# Patient Record
Sex: Female | Born: 1935 | Race: Black or African American | Hispanic: No | Smoking: Never smoker
Health system: Southern US, Community
[De-identification: ages and names within clinical notes are randomized; demographics above are authoritative.]

## PROBLEM LIST (undated history)

## (undated) DIAGNOSIS — E119 Type 2 diabetes mellitus without complications: Secondary | ICD-10-CM

## (undated) DIAGNOSIS — F0390 Unspecified dementia without behavioral disturbance: Secondary | ICD-10-CM

## (undated) DIAGNOSIS — M109 Gout, unspecified: Secondary | ICD-10-CM

## (undated) DIAGNOSIS — I10 Essential (primary) hypertension: Secondary | ICD-10-CM

## (undated) DIAGNOSIS — M869 Osteomyelitis, unspecified: Secondary | ICD-10-CM

## (undated) DIAGNOSIS — M1990 Unspecified osteoarthritis, unspecified site: Secondary | ICD-10-CM

## (undated) HISTORY — PX: PACEMAKER INSERTION: SHX728

## (undated) HISTORY — PX: CARDIAC SURGERY: SHX584

## (undated) HISTORY — PX: OTHER SURGICAL HISTORY: SHX169

---

## 2013-10-31 ENCOUNTER — Emergency Department: Payer: Self-pay | Admitting: Internal Medicine

## 2013-10-31 LAB — URINALYSIS, COMPLETE
Bilirubin,UR: NEGATIVE
Blood: NEGATIVE
Glucose,UR: NEGATIVE mg/dL (ref 0–75)
Ketone: NEGATIVE
NITRITE: NEGATIVE
Ph: 8 (ref 4.5–8.0)
RBC, UR: NONE SEEN /HPF (ref 0–5)
SQUAMOUS EPITHELIAL: NONE SEEN
Specific Gravity: 1.012 (ref 1.003–1.030)
WBC UR: 2 /HPF (ref 0–5)

## 2013-10-31 LAB — COMPREHENSIVE METABOLIC PANEL
ALT: 30 U/L
AST: 20 U/L (ref 15–37)
Albumin: 3 g/dL — ABNORMAL LOW (ref 3.4–5.0)
Alkaline Phosphatase: 54 U/L
Anion Gap: 8 (ref 7–16)
BILIRUBIN TOTAL: 0.4 mg/dL (ref 0.2–1.0)
BUN: 24 mg/dL — ABNORMAL HIGH (ref 7–18)
CALCIUM: 9.6 mg/dL (ref 8.5–10.1)
Chloride: 109 mmol/L — ABNORMAL HIGH (ref 98–107)
Co2: 21 mmol/L (ref 21–32)
Creatinine: 1.25 mg/dL (ref 0.60–1.30)
EGFR (Non-African Amer.): 38 — ABNORMAL LOW
GFR CALC AF AMER: 44 — AB
GLUCOSE: 152 mg/dL — AB (ref 65–99)
OSMOLALITY: 283 (ref 275–301)
Potassium: 4 mmol/L (ref 3.5–5.1)
Sodium: 138 mmol/L (ref 136–145)
Total Protein: 6.9 g/dL (ref 6.4–8.2)

## 2013-10-31 LAB — CBC
HCT: 38.9 % (ref 35.0–47.0)
HGB: 12.5 g/dL (ref 12.0–16.0)
MCH: 30 pg (ref 26.0–34.0)
MCHC: 32 g/dL (ref 32.0–36.0)
MCV: 94 fL (ref 80–100)
Platelet: 237 10*3/uL (ref 150–440)
RBC: 4.16 10*6/uL (ref 3.80–5.20)
RDW: 15.3 % — AB (ref 11.5–14.5)
WBC: 6.9 10*3/uL (ref 3.6–11.0)

## 2013-10-31 LAB — TROPONIN I: Troponin-I: <0.02 ng/mL

## 2015-06-09 ENCOUNTER — Other Ambulatory Visit
Admission: RE | Admit: 2015-06-09 | Discharge: 2015-06-09 | Disposition: A | Discharge status: Home / Self Care | Payer: Medicare Other | Source: Ambulatory Visit | Attending: Family Medicine | Admitting: Family Medicine

## 2015-06-09 DIAGNOSIS — M869 Osteomyelitis, unspecified: Secondary | ICD-10-CM | POA: Insufficient documentation

## 2015-06-09 LAB — CBC WITH DIFFERENTIAL/PLATELET
BASOS ABS: 0 10*3/uL (ref 0–0.1)
Basophils Relative: 0 %
EOS ABS: 0.1 10*3/uL (ref 0–0.7)
EOS PCT: 1 %
HCT: 23.7 % — ABNORMAL LOW (ref 35.0–47.0)
Hemoglobin: 7.7 g/dL — ABNORMAL LOW (ref 12.0–16.0)
Lymphocytes Relative: 12 %
Lymphs Abs: 0.6 10*3/uL — ABNORMAL LOW (ref 1.0–3.6)
MCH: 28.5 pg (ref 26.0–34.0)
MCHC: 32.6 g/dL (ref 32.0–36.0)
MCV: 87.4 fL (ref 80.0–100.0)
Monocytes Absolute: 0.9 10*3/uL (ref 0.2–0.9)
Monocytes Relative: 18 %
Neutro Abs: 3.5 10*3/uL (ref 1.4–6.5)
Neutrophils Relative %: 69 %
PLATELETS: 227 10*3/uL (ref 150–440)
RBC: 2.71 MIL/uL — AB (ref 3.80–5.20)
RDW: 17.9 % — ABNORMAL HIGH (ref 11.5–14.5)
WBC: 5.2 10*3/uL (ref 3.6–11.0)

## 2015-06-09 LAB — CREATININE, SERUM
Creatinine, Ser: 1.31 mg/dL — ABNORMAL HIGH (ref 0.44–1.00)
GFR, EST AFRICAN AMERICAN: 40 mL/min — AB (ref >60)
GFR, EST NON AFRICAN AMERICAN: 34 mL/min — AB (ref >60)

## 2015-06-09 LAB — BUN: BUN: 19 mg/dL (ref 6–20)

## 2015-06-09 LAB — VANCOMYCIN, TROUGH: Vancomycin Tr: 28 ug/mL (ref 10–20)

## 2015-06-12 ENCOUNTER — Inpatient Hospital Stay
Admission: EM | Admit: 2015-06-12 | Discharge: 2015-06-15 | DRG: 539 | Disposition: A | Discharge status: Home / Self Care | Payer: Medicare Other | Attending: Internal Medicine | Admitting: Internal Medicine

## 2015-06-12 ENCOUNTER — Emergency Department: Payer: Medicare Other

## 2015-06-12 DIAGNOSIS — N3090 Cystitis, unspecified without hematuria: Secondary | ICD-10-CM

## 2015-06-12 DIAGNOSIS — L89150 Pressure ulcer of sacral region, unstageable: Secondary | ICD-10-CM

## 2015-06-12 DIAGNOSIS — R410 Disorientation, unspecified: Secondary | ICD-10-CM

## 2015-06-12 DIAGNOSIS — E669 Obesity, unspecified: Secondary | ICD-10-CM | POA: Diagnosis present

## 2015-06-12 DIAGNOSIS — G9341 Metabolic encephalopathy: Secondary | ICD-10-CM | POA: Diagnosis present

## 2015-06-12 DIAGNOSIS — R4182 Altered mental status, unspecified: Secondary | ICD-10-CM | POA: Diagnosis present

## 2015-06-12 DIAGNOSIS — Z6826 Body mass index (BMI) 26.0-26.9, adult: Secondary | ICD-10-CM | POA: Diagnosis not present

## 2015-06-12 DIAGNOSIS — Z515 Encounter for palliative care: Secondary | ICD-10-CM | POA: Diagnosis present

## 2015-06-12 DIAGNOSIS — I129 Hypertensive chronic kidney disease with stage 1 through stage 4 chronic kidney disease, or unspecified chronic kidney disease: Secondary | ICD-10-CM | POA: Diagnosis present

## 2015-06-12 DIAGNOSIS — Z7401 Bed confinement status: Secondary | ICD-10-CM | POA: Diagnosis not present

## 2015-06-12 DIAGNOSIS — Z79899 Other long term (current) drug therapy: Secondary | ICD-10-CM

## 2015-06-12 DIAGNOSIS — L8990 Pressure ulcer of unspecified site, unspecified stage: Secondary | ICD-10-CM | POA: Insufficient documentation

## 2015-06-12 DIAGNOSIS — N189 Chronic kidney disease, unspecified: Secondary | ICD-10-CM | POA: Diagnosis present

## 2015-06-12 DIAGNOSIS — M4628 Osteomyelitis of vertebra, sacral and sacrococcygeal region: Secondary | ICD-10-CM | POA: Diagnosis present

## 2015-06-12 DIAGNOSIS — L89153 Pressure ulcer of sacral region, stage 3: Secondary | ICD-10-CM | POA: Diagnosis present

## 2015-06-12 DIAGNOSIS — D638 Anemia in other chronic diseases classified elsewhere: Secondary | ICD-10-CM | POA: Diagnosis present

## 2015-06-12 DIAGNOSIS — F0390 Unspecified dementia without behavioral disturbance: Secondary | ICD-10-CM | POA: Diagnosis present

## 2015-06-12 HISTORY — DX: Unspecified dementia, unspecified severity, without behavioral disturbance, psychotic disturbance, mood disturbance, and anxiety: F03.90

## 2015-06-12 HISTORY — DX: Osteomyelitis, unspecified: M86.9

## 2015-06-12 HISTORY — DX: Essential (primary) hypertension: I10

## 2015-06-12 LAB — URINALYSIS COMPLETE WITH MICROSCOPIC (ARMC ONLY)
Bacteria, UA: NONE SEEN
Bilirubin Urine: NEGATIVE
GLUCOSE, UA: NEGATIVE mg/dL
KETONES UR: NEGATIVE mg/dL
Nitrite: NEGATIVE
Protein, ur: 100 mg/dL — AB
SPECIFIC GRAVITY, URINE: 1.017 (ref 1.005–1.030)
pH: 7 (ref 5.0–8.0)

## 2015-06-12 LAB — CBC
HCT: 24.8 % — ABNORMAL LOW (ref 35.0–47.0)
Hemoglobin: 8 g/dL — ABNORMAL LOW (ref 12.0–16.0)
MCH: 28.5 pg (ref 26.0–34.0)
MCHC: 32.4 g/dL (ref 32.0–36.0)
MCV: 87.8 fL (ref 80.0–100.0)
PLATELETS: 269 10*3/uL (ref 150–440)
RBC: 2.82 MIL/uL — AB (ref 3.80–5.20)
RDW: 18 % — ABNORMAL HIGH (ref 11.5–14.5)
WBC: 5.5 10*3/uL (ref 3.6–11.0)

## 2015-06-12 LAB — COMPREHENSIVE METABOLIC PANEL
ALK PHOS: 79 U/L (ref 38–126)
ALT: 31 U/L (ref 14–54)
AST: 39 U/L (ref 15–41)
Albumin: 2.7 g/dL — ABNORMAL LOW (ref 3.5–5.0)
Anion gap: 3 — ABNORMAL LOW (ref 5–15)
BUN: 20 mg/dL (ref 6–20)
CALCIUM: 8.8 mg/dL — AB (ref 8.9–10.3)
CO2: 20 mmol/L — ABNORMAL LOW (ref 22–32)
CREATININE: 1.37 mg/dL — AB (ref 0.44–1.00)
Chloride: 113 mmol/L — ABNORMAL HIGH (ref 101–111)
GFR, EST AFRICAN AMERICAN: 38 mL/min — AB (ref >60)
GFR, EST NON AFRICAN AMERICAN: 32 mL/min — AB (ref >60)
Glucose, Bld: 113 mg/dL — ABNORMAL HIGH (ref 65–99)
Potassium: 3.9 mmol/L (ref 3.5–5.1)
Sodium: 136 mmol/L (ref 135–145)
Total Bilirubin: 0.5 mg/dL (ref 0.3–1.2)
Total Protein: 6.8 g/dL (ref 6.5–8.1)

## 2015-06-12 LAB — VANCOMYCIN, RANDOM: Vancomycin Rm: 20 ug/mL

## 2015-06-12 LAB — LACTIC ACID, PLASMA: LACTIC ACID, VENOUS: 0.7 mmol/L (ref 0.5–2.0)

## 2015-06-12 MED ORDER — SODIUM CHLORIDE 0.9 % IV SOLN
1.0000 g | INTRAVENOUS | Status: DC
  Administered 2015-06-12: 1 g via INTRAVENOUS
  Filled 2015-06-12 (×2): qty 1

## 2015-06-12 MED ORDER — GUAIFENESIN 100 MG/5ML PO SOLN
5.0000 mL | ORAL | Status: DC | PRN
  Administered 2015-06-13: 100 mg via ORAL
  Filled 2015-06-12: qty 10

## 2015-06-12 MED ORDER — VANCOMYCIN HCL 500 MG IV SOLR
500.0000 mg | INTRAVENOUS | Status: DC
  Administered 2015-06-12 – 2015-06-14 (×3): 500 mg via INTRAVENOUS
  Filled 2015-06-12 (×4): qty 500

## 2015-06-12 MED ORDER — SODIUM CHLORIDE 0.9 % IV BOLUS (SEPSIS)
1000.0000 mL | Freq: Once | INTRAVENOUS | Status: AC
  Administered 2015-06-12: 1000 mL via INTRAVENOUS

## 2015-06-12 MED ORDER — ERTAPENEM SODIUM 1 G IJ SOLR
1.0000 g | Freq: Every evening | INTRAMUSCULAR | Status: DC
Start: 2015-06-12 — End: 2015-06-12

## 2015-06-12 MED ORDER — ONDANSETRON HCL 4 MG/2ML IJ SOLN: 4.0000 mg | Freq: Four times a day (QID) | INTRAMUSCULAR | Status: DC | PRN

## 2015-06-12 MED ORDER — POLYETHYLENE GLYCOL 3350 17 G PO PACK: 17.0000 g | PACK | Freq: Every day | ORAL | Status: DC | PRN

## 2015-06-12 MED ORDER — VANCOMYCIN HCL IN NACL 1-0.9 GM/250ML-% IV SOLN: 500.0000 g | Freq: Every evening | INTRAVENOUS | Status: DC

## 2015-06-12 MED ORDER — ACETAMINOPHEN 500 MG PO TABS
500.0000 mg | ORAL_TABLET | Freq: Three times a day (TID) | ORAL | Status: DC | PRN
  Administered 2015-06-12: 23:00:00 500 mg via ORAL
  Filled 2015-06-12: qty 1

## 2015-06-12 MED ORDER — APIXABAN 2.5 MG PO TABS
2.5000 mg | ORAL_TABLET | Freq: Two times a day (BID) | ORAL | Status: DC
  Administered 2015-06-12 – 2015-06-15 (×6): 2.5 mg via ORAL
  Filled 2015-06-12 (×7): qty 1

## 2015-06-12 MED ORDER — SODIUM CHLORIDE 0.9 % IV SOLN
INTRAVENOUS | Status: DC
  Administered 2015-06-12 – 2015-06-14 (×3): via INTRAVENOUS

## 2015-06-12 MED ORDER — ONDANSETRON HCL 4 MG PO TABS: 4.0000 mg | ORAL_TABLET | Freq: Four times a day (QID) | ORAL | Status: DC | PRN

## 2015-06-12 MED ORDER — AMLODIPINE BESYLATE 10 MG PO TABS
10.0000 mg | ORAL_TABLET | Freq: Every day | ORAL | Status: DC
  Administered 2015-06-12 – 2015-06-15 (×4): 10 mg via ORAL
  Filled 2015-06-12 (×4): qty 1

## 2015-06-12 NOTE — ED Notes (Signed)
Pt comes into the ED via EMS from home with c/o decreased LOC, AMS over night.. Pt recently spent 2 weeks at [facility] for Tx of sacral wound, pt has picc in place in left upper chest on arrival.. Pt has a foley catheter in place on arrival with cloudy, bloody urine present.Marland Kitchen.EMS reports FS 151, HR 98, 99% on RA..Marland Kitchen

## 2015-06-12 NOTE — ED Notes (Signed)
md at bedside

## 2015-06-12 NOTE — H&P (Signed)
[facility] - [place] at [facility]   PATIENT NAME: Jasmine Briggs    MR#:  [id number]  DATE OF BIRTH:  [birth year]  DATE OF ADMISSION:  06/12/2015  PRIMARY CARE PHYSICIAN: No primary care provider on file.   REQUESTING/REFERRING PHYSICIAN: Dr. Sharman CheekPhillip Stafford  CHIEF COMPLAINT:   Chief Complaint  Patient presents with  . Altered Mental Status    HISTORY OF PRESENT ILLNESS:  Jasmine Briggs  is a 82 y.o. female with a known history of Dementia, bedbound status being cared by at home, recent admission to an outside hospital for sacral decub ulcer and possible osteomyelitis and being discharged on IV vancomycin and Invanz was brought in by family secondary to altered mental status. No family in the room at this time and try to reach her son over the phone with no response at this time. Patient is unable to provide any information. She is confused and combative. Good upper extremity strength and trying to push people away. He is on the information obtained from the ER physician, family has noticed that patient's consciousness was decreased with decrease in her alertness overnight and brought her to the hospital. She already has a left chest PICC line through which she was receiving vancomycin and ertapenem via home health nursing. Patient had a Foley catheter when she came to the emergency room which was placed about 2 weeks ago during the discharge from the hospital. She had foul-smelling urine and urine cultures and blood cultures were sent and her Foley catheter has been replaced here in the emergency room. No white count noted on exam. No fevers.  PAST MEDICAL HISTORY:   Past Medical History  Diagnosis Date  . Dementia   . Osteomyelitis (HCC)     sacral wound  . Hypertension     PAST SURGICAL HISTORY:   Past Surgical History  Procedure Laterality Date  . Picc line placement      SOCIAL HISTORY:   Social History  Substance Use Topics  . Smoking status:  Never Smoker   . Smokeless tobacco: Not on file  . Alcohol Use: No    FAMILY HISTORY:  No family history on file. Unable to be obtained  DRUG ALLERGIES:  No Known Allergies  REVIEW OF SYSTEMS:   Review of Systems  Unable to perform ROS: dementia    MEDICATIONS AT HOME:   Prior to Admission medications   Medication Sig Start Date End Date Taking? Authorizing Provider  acetaminophen (TYLENOL) 500 MG tablet Take 500 mg by mouth every 8 (eight) hours as needed for mild pain.   Yes Historical Provider, MD  amLODipine (NORVASC) 10 MG tablet Take 10 mg by mouth daily.   Yes Historical Provider, MD  ertapenem (INVANZ) 1 g injection Inject 1 g into the muscle every evening. Ertapenem 1 g in sodium chloride 0.9% 100 mL IVPB.   Yes Historical Provider, MD  HEPARIN SODIUM, PORCINE, IV Inject 2 mLs into the vein every Monday, Wednesday, and Friday. Heparin, porcine 100 units/mL.   Yes Historical Provider, MD  ibuprofen (ADVIL,MOTRIN) 600 MG tablet Take 600 mg by mouth every 6 (six) hours as needed for mild pain.   Yes Historical Provider, MD  polyethylene glycol (MIRALAX / GLYCOLAX) packet Take 17 g by mouth daily as needed for mild constipation.   Yes Historical Provider, MD  Vancomycin HCl in NaCl 1-0.9 GM/250ML-% SOLN Inject 500 g into the vein every evening.   Yes Historical Provider, MD  VITAL SIGNS:  Blood pressure 139/64, pulse 70, temperature 98.2 F (36.8 C), temperature source Oral, resp. rate 17, height  (1.676 m), weight 86.183 kg (190 lb), SpO2 97 %.  PHYSICAL EXAMINATION:   Physical Exam  GENERAL:  82 y.o.-year-old patient lying in the bed with no acute distress.  EYES: Pupils equal, round, reactive to light and accommodation. No scleral icterus. Extraocular muscles intact.  HEENT: Head atraumatic, normocephalic. Oropharynx and nasopharynx clear.  NECK:  Supple, no jugular venous distention. No thyroid enlargement, no tenderness.  LUNGS: Normal breath sounds  bilaterally, no wheezing, rales,rhonchi or crepitation. No use of accessory muscles of respiration. Decreased bibasilar breath sounds  CARDIOVASCULAR: S1, S2 normal. No rubs, or gallops. 2/6 systolic murmur is present ABDOMEN: Soft, nontender, nondistended. Bowel sounds present. No organomegaly or mass.  EXTREMITIES: No pedal edema, cyanosis, or clubbing.  NEUROLOGIC: Patient is confused and unable to cooperate for neuro exam. Contracted in bed but has good upper extremity strength with a good grip as she is combative.  PSYCHIATRIC: The patient is alert but not oriented. Combative and confused SKIN: Sacral decub ulcer with dressing in place. Known stage IV with possible osteomyelitis and has been on IV antibiotics. Erythematous and foul smelling perineal area  LABORATORY PANEL:   CBC  Recent Labs Lab 06/12/15 1101  WBC 5.5  HGB 8.0*  HCT 24.8*  PLT 269   ------------------------------------------------------------------------------------------------------------------  Chemistries   Recent Labs Lab 06/12/15 1101  NA 136  K 3.9  CL 113*  CO2 20*  GLUCOSE 113*  BUN 20  CREATININE 1.37*  CALCIUM 8.8*  AST 39  ALT 31  ALKPHOS 79  BILITOT 0.5   ------------------------------------------------------------------------------------------------------------------  Cardiac Enzymes No results for input(s): TROPONINI in the last 168 hours. ------------------------------------------------------------------------------------------------------------------  RADIOLOGY:  Ct Head Wo Contrast  06/12/2015  CLINICAL DATA:  Altered mental status EXAM: CT HEAD WITHOUT CONTRAST TECHNIQUE: Contiguous axial images were obtained from the base of the skull through the vertex without intravenous contrast. COMPARISON:  10/31/2013 FINDINGS: The bony calvarium is intact. No gross soft tissue abnormality is noted. Diffuse atrophic changes are seen as well as chronic white matter ischemic change. No  findings to suggest acute hemorrhage, acute infarction or space-occupying mass lesion are noted. IMPRESSION: Chronic atrophic and ischemic changes without acute abnormality. Electronically Signed   By: Alcide Clever M.D.   On: 06/12/2015 12:54    EKG:   Orders placed or performed during the hospital encounter of 06/12/15  . ED EKG  . ED EKG    IMPRESSION AND PLAN:   Jasmine Briggs  is a 82 y.o. female with a known history of Dementia, bedbound status being cared by at home, recent admission to an outside hospital for sacral decub ulcer and possible osteomyelitis and being discharged on IV vancomycin and Invanz was brought in by family secondary to altered mental status.  #1 altered mental status- metabolic encephalopathy, underlying dementia IV fluids, CT head with no acute findings - cont IV ABX - monitor. Palliative care consulted  #2 sacral decub ulcer with possible osteomyelitis- recent discharge from [facility] per ER notes- unable to see the chart review. Wound cultures, blood cultures and urine cultures ordered again Normal wbc, no fevers - on home IV Invanz and vancomycin- continue - ID consult. UA dirty, but no bacteria- anyway had foley for 2 weeks, changed today  #3 hypertension- norvasc  #4 chronic kidney disease- stable. Monitor, avoid nephrotoxins  #5 anemia of chronic disease- stable. Continue to monitor  #  6 DVT prophylaxis-on Lovenox   All the records are reviewed and case discussed with ED provider. Management plans discussed with the patient, family and they are in agreement.  CODE STATUS: Full code  TOTAL TIME TAKING CARE OF THIS PATIENT: 50 minutes.    Enid Baas M.D on 06/12/2015 at 4:56 PM  Between 7am to 6pm - Pager - [phone]  After 6pm go to [email or web address] - password [email or web address] [facility]  [place] [facility]  Office  [phone]  CC: Primary care physician; No primary care provider on file.

## 2015-06-12 NOTE — ED Notes (Signed)
Pt cleaned,  Noted small bm, brown and soft.  All blood drawn as ordered and sent to lab.

## 2015-06-12 NOTE — Progress Notes (Signed)
ANTIBIOTIC CONSULT NOTE - INITIAL  Pharmacy Consult for Vancomcyin  Indication: wound infection, possible osteomyelitis  No Known Allergies  Patient Measurements: Height: 5\' 6"  (167.6 cm) Weight: 190 lb (86.183 kg) IBW/kg (Calculated) : 59.3 Adjusted Body Weight: 70.06 kg   Vital Signs: Temp: 97.4 F (36.3 C) (03/22 2052) Temp Source: Oral (03/22 2052) BP: 151/36 mmHg (03/22 2052) Pulse Rate: 89 (03/22 2052) Intake/Output from previous day:   Intake/Output from this shift:    Labs:  Recent Labs  06/12/15 1101  WBC 5.5  HGB 8.0*  PLT 269  CREATININE 1.37*   Estimated Creatinine Clearance: 29 mL/min (by C-G formula based on Cr of 1.37).  Recent Labs  06/12/15 1212  VANCORANDOM 20     Microbiology: No results found for this or any previous visit (from the past 720 hour(s)).  Medical History: Past Medical History  Diagnosis Date  . Dementia   . Osteomyelitis (HCC)     sacral wound  . Hypertension     Medications:  Prescriptions prior to admission  Medication Sig Dispense Refill Last Dose  . acetaminophen (TYLENOL) 500 MG tablet Take 500 mg by mouth every 8 (eight) hours as needed for mild pain.   06/11/2015 at 1700  . amLODipine (NORVASC) 10 MG tablet Take 10 mg by mouth daily.   06/12/2015 at Unknown time  . ertapenem (INVANZ) 1 g injection Inject 1 g into the muscle every evening. Ertapenem 1 g in sodium chloride 0.9% 100 mL IVPB.   06/11/2015 at Unknown time  . HEPARIN SODIUM, PORCINE, IV Inject 2 mLs into the vein every Monday, Wednesday, and Friday. Heparin, porcine 100 units/mL.   06/10/2015 at unknown   . ibuprofen (ADVIL,MOTRIN) 600 MG tablet Take 600 mg by mouth every 6 (six) hours as needed for mild pain.   06/11/2015 at 1030  . polyethylene glycol (MIRALAX / GLYCOLAX) packet Take 17 g by mouth daily as needed for mild constipation.   Past Month at Unknown time  . Vancomycin HCl in NaCl 1-0.9 GM/250ML-% SOLN Inject 500 g into the vein every evening.    06/11/2015 at Unknown time   Assessment: Pharmacy consulted to dose vancomycin in this 82 year old female admitted with wound infection, possible osteomyelitis.  Pt was on Vancomycin 500 mg IV Q24H at home.  Pt normally gets vanc around 14:00 daily but missed the dose scheduled for 3/22.    CrCl = 29.8 ml/min Ke = 0.029 hr-1 T1/2 = 23.9 hrs Vd = 60.3 L   Goal of Therapy:  Vancomycin trough level 15-20 mcg/ml  Plan:  Expected duration 10 days with resolution of temperature and/or normalization of WBC   Will resume pt's home dose of Vancomycin 500 mg IV Q24H on 3/22 @ 22:00.  Since this pt was started on vanc around 3/13, pt is already at [facility]. Will draw 1st trough on 3/23 @ 21:30. Vanc trough will most likely be low.  If low, would suggest increasing to Vanc 1 gm OR Vanc 1250 mg IV Q24H based on currently PK parameters.    Schneider Warchol D 06/12/2015,9:05 PM

## 2015-06-12 NOTE — ED Notes (Signed)
Attempt to call report, floor states that the nurse is unavailable to take report at this time.

## 2015-06-12 NOTE — ED Notes (Signed)
Pt to ct scan.

## 2015-06-12 NOTE — Progress Notes (Signed)
Just spoke with the son. [facility] her recent [facility]ral osteomyelitis. Discharged on 06/03/2015 and is supposed to be on 6 weeks of IV vancomycin and Invanz since then. She was also diagnosed with an right upper extremity clot during that hospitalization and is discharged on eliquis. We'll change her anticoagulation to eliquis now. Apparently at baseline, patient is alert and conversational with the family. Now she is very confused and combative.

## 2015-06-12 NOTE — ED Provider Notes (Signed)
[facility] Emergency Department Provider Note  ____________________________________________  Time seen: 11:20 AM  I have reviewed the triage vital signs and the nursing notes.   HISTORY  Chief Complaint Altered Mental Status Level 5 caveat:  Portions of the history and physical were unable to be obtained due to the patient's acute illness and confusion  History obtained from family members  HPI Jasmine Briggs is a 82 y.o. female brought to the ED by family due to decreased level of consciousness and alertness does seem to start overnight and they noticed this morning. She has been treated recently at [facility] 2-3 weeks ago for sacral wounds. She has a PIC line in place which her son administered versed daily vancomycin and ertapenem through. He has continued this daily up through yesterday's dose. He normally gets this around noon or 1 PM. Patient has also had a Foley catheter in place during this time. She has been eating and drinking normally according to the family. They're only other concern is that she seems to have a very congested cough but is not productive.     No past medical history on file. Sacral decubiti Hypertension  There are no active problems to display for this patient.    No past surgical history on file. PIC line insertion  No current outpatient prescriptions on file. Ertapenem Vancomycin Amlodipine Eliquis  Allergies Review of patient's allergies indicates no known allergies.   No family history on file.  Social History Social History  Substance Use Topics  . Smoking status: Never Smoker   . Smokeless tobacco: None  . Alcohol Use: No    Review of Systems Unable to obtain due to acute illness and confusion Positive for cough Positive for altered mental status Negative vomiting and diarrhea ____________________________________________   PHYSICAL EXAM:  VITAL SIGNS: ED Triage Vitals  Enc Vitals Group     BP 06/12/15  1051 148/83 mmHg     Pulse Rate 06/12/15 1051 78     Resp 06/12/15 1051 18     Temp 06/12/15 1051 98.2 F (36.8 C)     Temp Source 06/12/15 1051 Oral     SpO2 06/12/15 1051 100 %     Weight 06/12/15 1051 190 lb (86.183 kg)     Height 06/12/15 1051  (1.676 m)     Head Cir --      Peak Flow --      Pain Score --      Pain Loc --      Pain Edu? --      Excl. in GC? --     Vital signs reviewed, nursing assessments reviewed.   Constitutional:   Alert and oriented to self. Ill-appearing Eyes:   No scleral icterus. No conjunctival pallor. PERRL. EOMI ENT   Head:   Normocephalic and atraumatic.   Nose:   No congestion/rhinnorhea. No septal hematoma   Mouth/Throat:   Dry mucous membranes, unable to visualize oropharynx due to patient's inability to precipitate an exam    Neck:   No stridor. No SubQ emphysema. No meningismus. Hematological/Lymphatic/Immunilogical:   No cervical lymphadenopathy. Cardiovascular:   RRR. Symmetric bilateral radial and DP pulses.  No murmurs.  Respiratory:   Normal respiratory effort without tachypnea nor retractions. Breath sounds are clear and equal bilaterally. No wheezes/rales/rhonchi. Gastrointestinal:   Soft with mild left upper quadrant and suprapubic tenderness. Non distended.   No rebound, rigidity, or guarding. Genitourinary:   Poor hygiene of the perineum, Foley catheter in  place some evidence of colonization of the tubing Musculoskeletal:   Nontender with normal range of motion in all extremities. No joint effusions.  No lower extremity tenderness.  No edema. Tunneling sacral decubiti bilaterally, unstageable. Contaminated with fecal material. No gross purulent drainage. Neurologic:   Mumbling incoherent speech.  CN 2-10 normal. Motor grossly intact. Skin:    Skin is warm, dry and intact. No rash noted.  No petechiae, purpura, or bullae. Poor skin turgor ____________________________________________    LABS (pertinent  positives/negatives) (all labs ordered are listed, but only abnormal results are displayed) Labs Reviewed  COMPREHENSIVE METABOLIC PANEL - Abnormal; Notable for the following:    Chloride 113 (*)    CO2 20 (*)    Glucose, Bld 113 (*)    Creatinine, Ser 1.37 (*)    Calcium 8.8 (*)    Albumin 2.7 (*)    GFR calc non Af Amer 32 (*)    GFR calc Af Amer 38 (*)    Anion gap 3 (*)    All other components within normal limits  CBC - Abnormal; Notable for the following:    RBC 2.82 (*)    Hemoglobin 8.0 (*)    HCT 24.8 (*)    RDW 18.0 (*)    All other components within normal limits  URINALYSIS COMPLETEWITH MICROSCOPIC ([facility] ONLY) - Abnormal; Notable for the following:    Color, Urine YELLOW (*)    APPearance TURBID (*)    Hgb urine dipstick 3+ (*)    Protein, ur 100 (*)    Leukocytes, UA 2+ (*)    Squamous Epithelial / LPF 6-30 (*)    All other components within normal limits  URINE CULTURE  CULTURE, BLOOD (ROUTINE X 2)  CULTURE, BLOOD (ROUTINE X 2)  LACTIC ACID, PLASMA  VANCOMYCIN, RANDOM  LACTIC ACID, PLASMA  TROPONIN I   ____________________________________________   EKG  Interpreted by me Ventricular lead paced rhythm. Left axis. Left bundle branch block. No acute ischemic changes. Rate 77.  ____________________________________________    RADIOLOGY CT head unremarkable Chest x-ray interpreted by me, radiology report unavailable as of 3:05 PM, shows no acute airspace disease. No pneumothorax. The patient is laterally rotated to the right..  ____________________________________________   PROCEDURES   ____________________________________________   INITIAL IMPRESSION / ASSESSMENT AND PLAN / ED COURSE  Pertinent labs & imaging results that were available during my care of the patient were reviewed by me and considered in my medical decision making (see chart for details).  Patient presents with altered mental status. Very foul urine from the Foley catheter and  very poor perineal hygiene present. High suspicion for urinary tract infection causing delirium. Also on exam her sacral decubiti seem to tunnel and the wounds are soiled with feces, raising concern for worsened soft tissue infection of the area despite vancomycin and ertapenem because of inadequate hygiene. The patient would benefit from hospitalization for further management and stabilization until culture data is available given the apparent delirium despite broad-spectrum antibiotic use with ertapenem and vancomycin. Urinalysis consistent with urinary tract infection.   ----------------------------------------- 2:58 PM on 06/12/2015 -----------------------------------------  After placing the Foley, the urine output is still cloudy and turbid. Labs otherwise unremarkable. We'll discuss with hospitalist.    ____________________________________________   FINAL CLINICAL IMPRESSION(S) / ED DIAGNOSES  Final diagnoses:  Chronic renal insufficiency, unspecified stage  Cystitis  Delirium  Sacral decubitus ulcer, unstageable (HCC)   chronic anemia. Chronic renal insufficiency Acute delirium due to cystitis  Sharman Cheek, MD 06/12/15 1505

## 2015-06-12 NOTE — ED Notes (Addendum)
Report to Chad, RN 

## 2015-06-12 NOTE — ED Notes (Signed)
Admitting MD at bedside.

## 2015-06-12 NOTE — ED Notes (Signed)
Pt urinary catheter removed per orders.  Pt perineal area red, swollen, with brown colored discharge noted all over vaginal area.  Foul odor noted from pt catheter.  New catheter placed as ordered.  Pt tolerated well.

## 2015-06-13 LAB — VANCOMYCIN, TROUGH: VANCOMYCIN TR: 20 ug/mL (ref 10–20)

## 2015-06-13 MED ORDER — ENSURE ENLIVE PO LIQD
237.0000 mL | Freq: Two times a day (BID) | ORAL | Status: DC
  Administered 2015-06-14 (×2): 237 mL via ORAL

## 2015-06-13 MED ORDER — SODIUM CHLORIDE 0.9 % IV SOLN
500.0000 mg | INTRAVENOUS | Status: DC
  Administered 2015-06-13 – 2015-06-14 (×2): 0.5 g via INTRAVENOUS
  Filled 2015-06-13 (×3): qty 0.5

## 2015-06-13 NOTE — Evaluation (Signed)
Clinical/Bedside Swallow Evaluation Patient Details  Name: Jasmine CaraSarah Briggs MRN: [id number] Date of Birth: 02-20-1921  Today's Date: 06/13/2015 Time: SLP Start Time (ACUTE ONLY): 1045 SLP Stop Time (ACUTE ONLY): 1145 SLP Time Calculation (min) (ACUTE ONLY): 60 min  Past Medical History:  Past Medical History  Diagnosis Date  . Dementia   . Osteomyelitis (HCC)     sacral wound  . Hypertension    Past Surgical History:  Past Surgical History  Procedure Laterality Date  . Picc line placement     HPI:      Assessment / Plan / Recommendation Clinical Impression  Pt appears to present w/ oral phase dysphagia impacted by declined Cognitive status and Dementia; this can increase risk for aspiration in pts. Pt appeared to tolerate trials of thin liquids via straw and trials of puree w/ mod-max. verbal/tactile cues during bolus presentation as did not readily open mouth to receive boluses. No coughing or other overt s/s of aspiration noted during swallowing or clearing of bolus trials; no decline in respiratory status noted w/ trials. Pt required max. cues and feeding assistance sec. to Cognitive decline and legal blindness at baseline. MD consulted and agreed w/ Dys. 1 diet w/ thin liquids w/ aspiration precautions; meds in Puree w/ NSG. Feeding assistance. Education given to Son present.      Aspiration Risk  Mild aspiration risk    Diet Recommendation  Dys. 1 w/ thin liquids (trials to upgrade when appropriate); aspiration precautions; feeding assistance at meals w/ cues to increase awareness.   Medication Administration: Crushed with puree    Other  Recommendations Recommended Consults:  (Dietician) Oral Care Recommendations: Oral care BID;Staff/trained caregiver to provide oral care   Follow up Recommendations   (TBD)    Frequency and Duration min 2x/week  2 weeks       Prognosis Prognosis for Safe Diet Advancement: Fair Barriers to Reach Goals: Cognitive deficits      Swallow  Study   General Date of Onset: 06/12/15 Type of Study: Bedside Swallow Evaluation Previous Swallow Assessment: none indicated Diet Prior to this Study: Dysphagia 3 (soft);Thin liquids;Dysphagia 2 (chopped) (per description) Temperature Spikes Noted: No (wbc 5.5) Respiratory Status: Room air History of Recent Intubation: No Behavior/Cognition: Pleasant mood;Confused;Alert;Requires cueing;Doesn't follow directions (pt is legally blind) Oral Cavity Assessment:  (unable to formally assess sec. to Cognitive decline) Oral Care Completed by SLP:  (unable to complete sec. to Congitive decline) Oral Cavity - Dentition: Missing dentition;Poor condition Vision: Impaired for self-feeding Self-Feeding Abilities: Total assist Patient Positioning: Upright in bed Baseline Vocal Quality:  (minimally verbal; mumbled speech x2) Volitional Cough: Cognitively unable to elicit Volitional Swallow: Unable to elicit    Oral/Motor/Sensory Function Overall Oral Motor/Sensory Function:  (unable to assess sec. to Cognitive status/decline)   Ice Chips Ice chips: Impaired Presentation: Spoon (fed; 2 trials) Oral Phase Impairments: Poor awareness of bolus Oral Phase Functional Implications: Prolonged oral transit Pharyngeal Phase Impairments:  (none) Other Comments: did not readily open mouth to receive boluses   Thin Liquid Thin Liquid: Impaired Presentation: Straw (fed; 10+ trials) Oral Phase Impairments: Poor awareness of bolus Oral Phase Functional Implications:  (none) Pharyngeal  Phase Impairments:  (none) Other Comments: did not readily open mouth to receive boluses    Nectar Thick Nectar Thick Liquid: Not tested   Honey Thick Honey Thick Liquid: Not tested   Puree Puree: Impaired Presentation: Spoon (fed; 10 trials) Oral Phase Impairments: Poor awareness of bolus Oral Phase Functional Implications: Prolonged oral transit (min.)  Pharyngeal Phase Impairments:  (none) Other Comments: did not readily  open mouth to receive boluses   Solid   GO   Solid: Impaired Presentation:  (fed; 2 trials) Oral Phase Impairments: Poor awareness of bolus Oral Phase Functional Implications:  (expectorated bolus trials x2) Pharyngeal Phase Impairments:  (none) Other Comments: did not readily open mouth to receive boluses       Jerilynn Som, MS, CCC-SLP  Jasmine Briggs 06/13/2015,3:50 PM

## 2015-06-13 NOTE — Plan of Care (Signed)
Problem: Education: Goal: Knowledge of [facility] Education information/materials will improve Outcome: Progressing Pt is blind, family requests staff to inform patient of what is to happen before tasks are performed. Pt has a dislocated R shoulder.

## 2015-06-13 NOTE — Care Management (Signed)
Admitted to this facility with altered mental status. Son, Jasmine Briggs, lives with her [phone]). Son Jasmine Briggs is HCPOA [phone]). Spoke with son Jasmine Briggs. Other son is Jasmine Briggs [phone]) [facility] 06/03/15. Receives IV antibiotics in the home through a PICC line. [facility] provides antibiotics, and nurse for wound care. [facility]. Last time Jasmine Briggs was up and mobile was 8-10 months ago. She only went from the bed to the bathroom and chair at that time. Sees Dr, Estelle JuneGladmon as personal care physician. Seen about a year ago. 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 States that the doctor treated her for the wrong thing afterward she was blind. No home health in the past. No skilled Nursing in the past. No home oxygen. Family helps with her basic needs. States her appetite is good, if family is feeding her. Jasmine GreetBrenda S Abiha Lukehart RN MSN CCM Care Management [phone]

## 2015-06-13 NOTE — Consult Note (Signed)
WOC wound consult note Reason for Consult: Chronic Stage 3 pressure injury (resisting assessment, but no bone directly palpable) Wound type: Chronic pressure injury Pressure Ulcer POA: Yes Measurement:2 cm x 2 cm x 1 cm x tunneling noted on admission assessment, extending 1 cm .  Patient resisted assessment today and was combative.  Wound WUJ:WJXBJbed:Clean and pink Drainage (amount, consistency, odor) Minimal serosanguinous drainage.  No odor present Periwound:Scarring from previous injury present.   Dressing procedure/placement/frequency:Cleanse wound with NS and pat gently dry.  Gently fill wound bed, including tunneling with calcium alginate.  Cover with 4x4 gauze and ABD pad.  Change daily.  Will not follow at this time.  Please re-consult if needed.  Maple HudsonKaren Jaylynne Birkhead RN BSN CWON Pager [phone]

## 2015-06-13 NOTE — Evaluation (Signed)
Physical Therapy Evaluation Patient Details Name: Jasmine Briggs MRN: [id number] DOB: 1920-08-29 Today's Date: 06/13/2015   History of Present Illness  Pt admitted for AMS. Pt with history of dementia, osteomyelitis in sacral wound. Pt also with dislocated R shoulder and blind. Per chart review and talking with son, pt is bedbound for last 8-10 months; total assist for all mobility.  Clinical Impression  Pt is a pleasantly confused 82 year old female who was admitted for AMS. Pt is currently bed bound for last 8-10 months and has sons who lift her with total assist to wheelchair or toilet. Pt moves B hands spontaneously, however unable to follow functional commands for grip strength or formal MMT. Pt unable to move B LE at this time. Pt is poor historian, all history obtained from son. Pt is currently at baseline level and does not require PT services at this time. Pt would benefit from lift to assist with all transfers in/out of bed. Family agreeable. Will dc orders at this time.    Follow Up Recommendations Supervision/Assistance - 24 hour    Equipment Recommendations   (hoyer lift)    Recommendations for Other Services       Precautions / Restrictions Precautions Precautions: Fall Restrictions Weight Bearing Restrictions: No      Mobility  Bed Mobility               General bed mobility comments: unable to participate in bed mobility at this time. Total assist for all mobility  Transfers                    Ambulation/Gait                Stairs            Wheelchair Mobility    Modified Rankin (Stroke Patients Only)       Balance Overall balance assessment:  (not assessed)                                           Pertinent Vitals/Pain Pain Assessment: No/denies pain    Home Living Family/patient expects to be discharged to:: Private residence Living Arrangements: Children Available Help at Discharge: Available 24  hours/day;Family Type of Home: House Home Access: Ramped entrance     Home Layout: One level Home Equipment: Wheelchair - manual;Hospital bed      Prior Function Level of Independence: Needs assistance         Comments: total assist for all mobility     Hand Dominance        Extremity/Trunk Assessment   Upper Extremity Assessment: Generalized weakness (grossly 3/5; however unable to follow further commands)           Lower Extremity Assessment: Generalized weakness (unable to participate in MMT)         Communication   Communication: No difficulties  Cognition Arousal/Alertness: Lethargic Behavior During Therapy: WFL for tasks assessed/performed Overall Cognitive Status: Impaired/Different from baseline                      General Comments General comments (skin integrity, edema, etc.): sacral ulcer; noted pt positioned on side with pillows between legs    Exercises        Assessment/Plan    PT Assessment Patent does not need any further PT services  PT Diagnosis Generalized weakness;Difficulty  walking;Altered mental status   PT Problem List    PT Treatment Interventions     PT Goals (Current goals can be found in the Care Plan section) Acute Rehab PT Goals Patient Stated Goal: to go home PT Goal Formulation: All assessment and education complete, DC therapy Time For Goal Achievement: 06-23-15 Potential to Achieve Goals: Poor    Frequency     Barriers to discharge        Co-evaluation               End of Session   Activity Tolerance: Patient tolerated treatment well Patient left: in bed;with bed alarm set Nurse Communication: Mobility status;Need for lift equipment         Time: 1610-9604 PT Time Calculation (min) (ACUTE ONLY): 8 min   Charges:   PT Evaluation $PT Eval Low Complexity: 1 Procedure     PT G Codes:        Nitesh Pitstick 06-23-15, 3:03 PM  Elizabeth Palau, PT, DPT [phone]

## 2015-06-13 NOTE — Progress Notes (Signed)
Patient ID: Jasmine Briggs, female   DOB: June 07, 1920, 82 y.o.   MRN: [id number] [facility] PROGRESS NOTE  Jasmine Briggs WUJ:[id number] DOB: 12/13/1920 DOA: 06/12/2015 PCP: No primary care provider on file.  HPI/Subjective: Patient seen at the time speech therapy was seeing the patient. She did answer a few questions but limited. Unable to get any complaints on how she is feeling.   Objective: Filed Vitals:   06/13/15 0846 06/13/15 1345  BP: 123/64 115/65  Pulse:  69  Temp:  97.7 F (36.5 C)  Resp:      Filed Weights   06/12/15 1051 06/12/15 2052  Weight: 86.183 kg (190 lb) 74.707 kg (164 lb 11.2 oz)    ROS: Review of Systems  Unable to perform ROS  secondary to altered mental status. Exam: Physical Exam  HENT:  Nose: No mucosal edema.  Mouth/Throat: No oropharyngeal exudate or posterior oropharyngeal edema.  Eyes: Conjunctivae, EOM and lids are normal. Pupils are equal, round, and reactive to light.  Neck: No JVD present. Carotid bruit is not present. No edema present. No thyroid mass and no thyromegaly present.  Cardiovascular: S1 normal and S2 normal.  Exam reveals no gallop.   No murmur heard. Pulses:      Dorsalis pedis pulses are 2+ on the right side, and 2+ on the left side.  Respiratory: No respiratory distress. She has no wheezes. She has no rhonchi. She has no rales.  GI: Soft. Bowel sounds are normal. There is no tenderness.  Musculoskeletal:       Right ankle: She exhibits no swelling.       Left ankle: She exhibits no swelling.  Lymphadenopathy:    She has no cervical adenopathy.  Neurological: She is alert.  Legs contracted  Skin: Skin is warm. Nails show no clubbing.  As per wound care nurse sacral decubiti  Psychiatric: She has a normal mood and affect.      Data Reviewed: Basic Metabolic Panel:  Recent Labs Lab 06/09/15 1845 06/12/15 1101  NA  --  136  K  --  3.9  CL  --  113*  CO2  --  20*  GLUCOSE  --  113*  BUN 19 20   CREATININE 1.31* 1.37*  CALCIUM  --  8.8*   Liver Function Tests:  Recent Labs Lab 06/12/15 1101  AST 39  ALT 31  ALKPHOS 79  BILITOT 0.5  PROT 6.8  ALBUMIN 2.7*   CBC:  Recent Labs Lab 06/09/15 1845 06/12/15 1101  WBC 5.2 5.5  NEUTROABS 3.5  --   HGB 7.7* 8.0*  HCT 23.7* 24.8*  MCV 87.4 87.8  PLT 227 269     Recent Results (from the past 240 hour(s))  Urine culture     Status: None (Preliminary result)   Collection Time: 06/12/15 11:01 AM  Result Value Ref Range Status   Specimen Description URINE, RANDOM  Final   Special Requests NONE  Final   Culture TOO YOUNG TO READ  Final   Report Status PENDING  Incomplete  Culture, blood (routine x 2)     Status: None (Preliminary result)   Collection Time: 06/12/15 11:01 AM  Result Value Ref Range Status   Specimen Description BLOOD CENTRAL LINE  Final   Special Requests BOTTLES DRAWN AEROBIC AND ANAEROBIC  5CC  Final   Culture NO GROWTH < 24 HOURS  Final   Report Status PENDING  Incomplete  Culture, blood (routine x 2)     Status:  None (Preliminary result)   Collection Time: 06/12/15 12:12 PM  Result Value Ref Range Status   Specimen Description BLOOD LEFT HAND  Final   Special Requests   Final    BOTTLES DRAWN AEROBIC AND ANAEROBIC AERO 3CC ANA 8CC   Culture NO GROWTH < 24 HOURS  Final   Report Status PENDING  Incomplete  Wound culture     Status: None (Preliminary result)   Collection Time: 06/12/15  5:34 PM  Result Value Ref Range Status   Specimen Description ULCER  Final   Special Requests NONE  Final   Gram Stain PENDING  Incomplete   Culture TOO YOUNG TO READ  Final   Report Status PENDING  Incomplete     Studies: Ct Head Wo Contrast  06/12/2015  CLINICAL DATA:  Altered mental status EXAM: CT HEAD WITHOUT CONTRAST TECHNIQUE: Contiguous axial images were obtained from the base of the skull through the vertex without intravenous contrast. COMPARISON:  10/31/2013 FINDINGS: The bony calvarium is intact.  No gross soft tissue abnormality is noted. Diffuse atrophic changes are seen as well as chronic white matter ischemic change. No findings to suggest acute hemorrhage, acute infarction or space-occupying mass lesion are noted. IMPRESSION: Chronic atrophic and ischemic changes without acute abnormality. Electronically Signed   By: Alcide CleverMark  Lukens M.D.   On: 06/12/2015 12:54    Scheduled Meds: . amLODipine  10 mg Oral Daily  . apixaban  2.5 mg Oral BID  . ertapenem  500 mg Intravenous Q24H  . feeding supplement (ENSURE ENLIVE)  237 mL Oral BID BM  . vancomycin  500 mg Intravenous Q24H   Continuous Infusions: . sodium chloride 30 mL/hr at 06/13/15 1216    Assessment/Plan:  1. Acute encephalopathy. Not quite sure how much change from baseline. 2. Patient being treated for sacral decubitus osteomyelitis [facility] Seen by infectious disease doctors here and continue antibiotics. 3. Dementia without behavioral disturbance 4. Essential hypertension on Norvasc 5. Anemia of chronic disease 6. Takes Eliquis for DVT upper extremitiy  Code Status:     Code Status Orders        Start     Ordered   06/12/15 2036  Full code   Continuous     06/12/15 2035    Code Status History    Date Active Date Inactive Code Status Order ID Comments User Context   This patient has a current code status but no historical code status.     Family Communication: son on phone Disposition Plan: home soon  Consultants:  ID  Palliative care  Antibiotics:  Iv vancomycin  Iv invanz  Time spent: 25minutes  Alford HighlandWIETING, Bo Teicher  [facility]

## 2015-06-13 NOTE — Consult Note (Signed)
[facility] Infectious Disease     Reason for Consult:Sacral osteomyelitis    Referring Physician: Leslye Peer, R Date of Admission:  06/12/2015   Active Problems:   Altered mental status   HPI: Jasmine Briggs is a 82 y.o. female with dementia, bedbound, chronic foley, sacral decub with osteomyelitis currently on vanco and invanz at home admitted with AMS.  No fevers, wbc nml.  Son at bedside reports she is bedbound, uses depends, is full assist. Denies fevers.   Past Medical History  Diagnosis Date  . Dementia   . Osteomyelitis ([place])     sacral wound  . Hypertension    Past Surgical History  Procedure Laterality Date  . Picc line placement     Social History  Substance Use Topics  . Smoking status: Never Smoker   . Smokeless tobacco: None  . Alcohol Use: No   No family history on file.  Allergies: No Known Allergies  Current antibiotics: Antibiotics Given (last 72 hours)    Date/Time Action Medication Dose Rate   06/12/15 2133 Given   ertapenem (INVANZ) 1 g in sodium chloride 0.9 % 50 mL IVPB 1 g 100 mL/hr   06/12/15 2133 Given   vancomycin (VANCOCIN) 500 mg in sodium chloride 0.9 % 100 mL IVPB 500 mg 100 mL/hr      MEDICATIONS: . amLODipine  10 mg Oral Daily  . apixaban  2.5 mg Oral BID  . ertapenem  500 mg Intravenous Q24H  . feeding supplement (ENSURE ENLIVE)  237 mL Oral BID BM  . vancomycin  500 mg Intravenous Q24H    Review of Systems - 11 systems reviewed and negative per HPI   OBJECTIVE: Temp:  [97.4 F (36.3 C)-97.7 F (36.5 C)] 97.7 F (36.5 C) (03/23 1345) Pulse Rate:  [69-89] 69 (03/23 1345) Resp:  [11-20] 18 (03/23 0503) BP: (108-151)/(36-118) 115/65 mmHg (03/23 1345) SpO2:  [95 %-100 %] 100 % (03/23 1345) Weight:  [74.707 kg (164 lb 11.2 oz)] 74.707 kg (164 lb 11.2 oz) (03/22 2052) Physical Exam  Constitutional:  Obese Chronically ill appearing, lying in bed, moans some but otherwise not interactive HENT: [place]/AT, PERRLA, no scleral  icterus Mouth/Throat: Oropharynx is clear and dry . No oropharyngeal exudate.  Cardiovascular: Normal rate, regular rhythm  Pulmonary/Chest: Effort normal and breath sounds normal.  Neck  supple, no nuchal rigidity PICC L chest wall Abdominal: Soft. Obese Bowel sounds are normal.  exhibits no distension. There is no tenderness.  Lymphadenopathy: no cervical adenopathy. No axillary adenopathy Neurological: minimally interactive Skin: sacral decub examined with approx 2 cm opening with small amt of undermining, no odor, minimal drainage   Psychiatric:minimally interactive LABS: Results for orders placed or performed during the hospital encounter of 06/12/15 (from the past 48 hour(s))  Comprehensive metabolic panel     Status: Abnormal   Collection Time: 06/12/15 11:01 AM  Result Value Ref Range   Sodium 136 135 - 145 mmol/L   Potassium 3.9 3.5 - 5.1 mmol/L   Chloride 113 (H) 101 - 111 mmol/L   CO2 20 (L) 22 - 32 mmol/L   Glucose, Bld 113 (H) 65 - 99 mg/dL   BUN 20 6 - 20 mg/dL   Creatinine, Ser 1.37 (H) 0.44 - 1.00 mg/dL   Calcium 8.8 (L) 8.9 - 10.3 mg/dL   Total Protein 6.8 6.5 - 8.1 g/dL   Albumin 2.7 (L) 3.5 - 5.0 g/dL   AST 39 15 - 41 U/L   ALT 31 14 - 54  U/L   Alkaline Phosphatase 79 38 - 126 U/L   Total Bilirubin 0.5 0.3 - 1.2 mg/dL   GFR calc non Af Amer 32 (L) >60 mL/min   GFR calc Af Amer 38 (L) >60 mL/min    Comment: (NOTE) The eGFR has been calculated using the CKD EPI equation. This calculation has not been validated in all clinical situations. eGFR's persistently <60 mL/min signify possible Chronic Kidney Disease.    Anion gap 3 (L) 5 - 15  CBC     Status: Abnormal   Collection Time: 06/12/15 11:01 AM  Result Value Ref Range   WBC 5.5 3.6 - 11.0 K/uL   RBC 2.82 (L) 3.80 - 5.20 MIL/uL   Hemoglobin 8.0 (L) 12.0 - 16.0 g/dL   HCT 24.8 (L) 35.0 - 47.0 %   MCV 87.8 80.0 - 100.0 fL   MCH 28.5 26.0 - 34.0 pg   MCHC 32.4 32.0 - 36.0 g/dL   RDW 18.0 (H) 11.5 - 14.5  %   Platelets 269 150 - 440 K/uL  Urine culture     Status: None (Preliminary result)   Collection Time: 06/12/15 11:01 AM  Result Value Ref Range   Specimen Description URINE, RANDOM    Special Requests NONE    Culture TOO YOUNG TO READ    Report Status PENDING   Urinalysis complete, with microscopic     Status: Abnormal   Collection Time: 06/12/15 11:01 AM  Result Value Ref Range   Color, Urine YELLOW (A) YELLOW   APPearance TURBID (A) CLEAR   Glucose, UA NEGATIVE NEGATIVE mg/dL   Bilirubin Urine NEGATIVE NEGATIVE   Ketones, ur NEGATIVE NEGATIVE mg/dL   Specific Gravity, Urine 1.017 1.005 - 1.030   Hgb urine dipstick 3+ (A) NEGATIVE   pH 7.0 5.0 - 8.0   Protein, ur 100 (A) NEGATIVE mg/dL   Nitrite NEGATIVE NEGATIVE   Leukocytes, UA 2+ (A) NEGATIVE   RBC / HPF TOO NUMEROUS TO COUNT 0 - 5 RBC/hpf   WBC, UA TOO NUMEROUS TO COUNT 0 - 5 WBC/hpf   Bacteria, UA NONE SEEN NONE SEEN   Squamous Epithelial / LPF 6-30 (A) NONE SEEN   WBC Clumps PRESENT    Mucous PRESENT    Budding Yeast PRESENT   Culture, blood (routine x 2)     Status: None (Preliminary result)   Collection Time: 06/12/15 11:01 AM  Result Value Ref Range   Specimen Description BLOOD CENTRAL LINE    Special Requests BOTTLES DRAWN AEROBIC AND ANAEROBIC  5CC    Culture NO GROWTH < 24 HOURS    Report Status PENDING   Culture, blood (routine x 2)     Status: None (Preliminary result)   Collection Time: 06/12/15 12:12 PM  Result Value Ref Range   Specimen Description BLOOD LEFT HAND    Special Requests      BOTTLES DRAWN AEROBIC AND ANAEROBIC AERO 3CC ANA 8CC   Culture NO GROWTH < 24 HOURS    Report Status PENDING   Lactic acid, plasma     Status: None   Collection Time: 06/12/15 12:12 PM  Result Value Ref Range   Lactic Acid, Venous 0.7 0.5 - 2.0 mmol/L  Vancomycin, random     Status: None   Collection Time: 06/12/15 12:12 PM  Result Value Ref Range   Vancomycin Rm 20 ug/mL    Comment:        Random Vancomycin  therapeutic range is dependent on dosage and time  of specimen collection. A peak range is 20.0-40.0 ug/mL A trough range is 5.0-15.0 ug/mL          Wound culture     Status: None (Preliminary result)   Collection Time: 06/12/15  5:34 PM  Result Value Ref Range   Specimen Description ULCER    Special Requests NONE    Gram Stain PENDING    Culture TOO YOUNG TO READ    Report Status PENDING    No components found for: ESR, C REACTIVE PROTEIN MICRO: Recent Results (from the past 720 hour(s))  Urine culture     Status: None (Preliminary result)   Collection Time: 06/12/15 11:01 AM  Result Value Ref Range Status   Specimen Description URINE, RANDOM  Final   Special Requests NONE  Final   Culture TOO YOUNG TO READ  Final   Report Status PENDING  Incomplete  Culture, blood (routine x 2)     Status: None (Preliminary result)   Collection Time: 06/12/15 11:01 AM  Result Value Ref Range Status   Specimen Description BLOOD CENTRAL LINE  Final   Special Requests BOTTLES DRAWN AEROBIC AND ANAEROBIC  5CC  Final   Culture NO GROWTH < 24 HOURS  Final   Report Status PENDING  Incomplete  Culture, blood (routine x 2)     Status: None (Preliminary result)   Collection Time: 06/12/15 12:12 PM  Result Value Ref Range Status   Specimen Description BLOOD LEFT HAND  Final   Special Requests   Final    BOTTLES DRAWN AEROBIC AND ANAEROBIC AERO 3CC ANA [place]   Culture NO GROWTH < 24 HOURS  Final   Report Status PENDING  Incomplete  Wound culture     Status: None (Preliminary result)   Collection Time: 06/12/15  5:34 PM  Result Value Ref Range Status   Specimen Description ULCER  Final   Special Requests NONE  Final   Gram Stain PENDING  Incomplete   Culture TOO YOUNG TO READ  Final   Report Status PENDING  Incomplete    IMAGING: Ct Head Wo Contrast  06/12/2015  CLINICAL DATA:  Altered mental status EXAM: CT HEAD WITHOUT CONTRAST TECHNIQUE: Contiguous axial images were obtained from the base of  the skull through the vertex without intravenous contrast. COMPARISON:  10/31/2013 FINDINGS: The bony calvarium is intact. No gross soft tissue abnormality is noted. Diffuse atrophic changes are seen as well as chronic white matter ischemic change. No findings to suggest acute hemorrhage, acute infarction or space-occupying mass lesion are noted. IMPRESSION: Chronic atrophic and ischemic changes without acute abnormality. Electronically Signed   By: Inez Catalina M.D.   On: 06/12/2015 12:54    Assessment:   Jasmine Briggs is a 82 y.o. female with dementia, bedbound, chronic foley, sacral decub with osteomyelitis currently on vanco and invanz at home admitted with AMS.  No fevers, wbc nml.  She was at [facility] but I have no records. Since care everywhere not available  Recommendations No evidence infection as cause of her recent admission.  [facility] summary She should follow up with [facility] as scheduled.  Suggest palliative care consult for goals of therapy Thank you very much for allowing me to participate in the care of this patient. Please call with questions.   Cheral Marker. Ola Spurr, MD

## 2015-06-13 NOTE — Progress Notes (Signed)
ANTIBIOTIC CONSULT NOTE - INITIAL  Pharmacy Consult for Vancomcyin  Indication: wound infection, possible osteomyelitis  No Known Allergies  Patient Measurements: Height: 5\' 6"  (167.6 cm) Weight: 164 lb 11.2 oz (74.707 kg) IBW/kg (Calculated) : 59.3 Adjusted Body Weight: 70.06 kg   Vital Signs: Temp: 97.4 F (36.3 C) (03/23 2057) Temp Source: Axillary (03/23 2057) BP: 160/78 mmHg (03/23 2057) Pulse Rate: 71 (03/23 2057) Intake/Output from previous day:   Intake/Output from this shift:    Labs:  Recent Labs  06/12/15 1101  WBC 5.5  HGB 8.0*  PLT 269  CREATININE 1.37*   Estimated Creatinine Clearance: 27.1 mL/min (by C-G formula based on Cr of 1.37).  Recent Labs  06/12/15 1212 06/13/15 2144  VANCOTROUGH  --  20  VANCORANDOM 20  --      Microbiology: Recent Results (from the past 720 hour(s))  Urine culture     Status: None (Preliminary result)   Collection Time: 06/12/15 11:01 AM  Result Value Ref Range Status   Specimen Description URINE, RANDOM  Final   Special Requests NONE  Final   Culture TOO YOUNG TO READ  Final   Report Status PENDING  Incomplete  Culture, blood (routine x 2)     Status: None (Preliminary result)   Collection Time: 06/12/15 11:01 AM  Result Value Ref Range Status   Specimen Description BLOOD CENTRAL LINE  Final   Special Requests BOTTLES DRAWN AEROBIC AND ANAEROBIC  5CC  Final   Culture NO GROWTH < 24 HOURS  Final   Report Status PENDING  Incomplete  Culture, blood (routine x 2)     Status: None (Preliminary result)   Collection Time: 06/12/15 12:12 PM  Result Value Ref Range Status   Specimen Description BLOOD LEFT HAND  Final   Special Requests   Final    BOTTLES DRAWN AEROBIC AND ANAEROBIC AERO 3CC ANA 8CC   Culture NO GROWTH < 24 HOURS  Final   Report Status PENDING  Incomplete  Wound culture     Status: None (Preliminary result)   Collection Time: 06/12/15  5:34 PM  Result Value Ref Range Status   Specimen Description  ULCER  Final   Special Requests NONE  Final   Gram Stain PENDING  Incomplete   Culture TOO YOUNG TO READ  Final   Report Status PENDING  Incomplete    Medical History: Past Medical History  Diagnosis Date  . Dementia   . Osteomyelitis (HCC)     sacral wound  . Hypertension     Medications:  Prescriptions prior to admission  Medication Sig Dispense Refill Last Dose  . acetaminophen (TYLENOL) 500 MG tablet Take 500 mg by mouth every 8 (eight) hours as needed for mild pain.   06/11/2015 at 1700  . allopurinol (ZYLOPRIM) 100 MG tablet Take 100 mg by mouth daily.     Marland Kitchen. apixaban (ELIQUIS) 2.5 MG TABS tablet Take 2.5 mg by mouth 2 (two) times daily.     . ertapenem (INVANZ) 1 g injection Inject 1 g into the muscle every evening. Reported on 06/12/2015     . HEPARIN SODIUM, PORCINE, IV Inject 2 mLs into the vein every Monday, Wednesday, and Friday. Heparin, porcine 100 units/mL.   06/10/2015 at unknown   . ibuprofen (ADVIL,MOTRIN) 600 MG tablet Take 600 mg by mouth every 6 (six) hours as needed for mild pain.   06/11/2015 at 1030  . Vancomycin HCl in NaCl 1-0.9 GM/250ML-% SOLN Inject 500 g into the  vein every evening.   06/11/2015 at Unknown time  . amLODipine (NORVASC) 10 MG tablet Take 10 mg by mouth daily. Reported on 06/12/2015    at Unknown time  . polyethylene glycol (MIRALAX / GLYCOLAX) packet Take 17 g by mouth daily as needed for mild constipation. Reported on 06/12/2015    at Unknown time   Assessment: Pharmacy consulted to dose vancomycin in this 82 year old female admitted with wound infection, possible osteomyelitis.  Pt was on Vancomycin 500 mg IV Q24H at home.  Pt normally gets vanc around 14:00 daily but missed the dose scheduled for 3/22.    CrCl = 29.8 ml/min Ke = 0.029 hr-1 T1/2 = 23.9 hrs Vd = 60.3 L   Goal of Therapy:  Vancomycin trough level 15-20 mcg/ml  Plan:  Expected duration 10 days with resolution of temperature and/or normalization of WBC   Will resume pt's  home dose of Vancomycin 500 mg IV Q24H on 3/22 @ 22:00.  Since this pt was started on vanc around 3/13, pt is already at [facility]. Will draw 1st trough on 3/23 @ 21:30. Vanc trough will most likely be low.  If low, would suggest increasing to Vanc 1 gm OR Vanc 1250 mg IV Q24H based on currently PK parameters.   3/23 PM vanc level 20. Continue current regimen. F/u SCr ordered with tomorrow AM labs.   Raylin Diguglielmo S 06/13/2015,11:22 PM

## 2015-06-13 NOTE — Progress Notes (Addendum)
Initial Nutrition Assessment   INTERVENTION:   Meals and Snacks: Cater to patient preferences, SLP following. Per son pt needs assistance with meals. Medical Food Supplement Therapy: Agree with Ensure as pt's son reports pt drinks PTA, Ensure Enlive po BID, each supplement provides 350 kcal and 20 grams of protein. Will add Magic Cup BID as well.   NUTRITION DIAGNOSIS:   Increased nutrient needs related to wound healing as evidenced by estimated needs.  GOAL:   Patient will meet greater than or equal to 90% of their needs  MONITOR:   PO intake, Supplement acceptance, Labs, Weight trends, I & O's, Skin  REASON FOR ASSESSMENT:    (Pressure Ulcer)    ASSESSMENT:    Pt admitted with AMS with h/o dementia, bedbound, with sacral decbuitus with osteomyelitis.  Past Medical History  Diagnosis Date  . Dementia   . Osteomyelitis (HCC)     sacral wound  . Hypertension      Diet Order:  DIET - DYS 1 Room service appropriate?: Yes with Assist; Fluid consistency:: Thin    Current Nutrition: Per son pt ate a little bit of lunch today without difficulty after diet order advanced.   Food/Nutrition-Related History: Pt son reports pt had a good appetite PTA but would eat better when a family member was feeding her. Pt's son also reports the pt likes 'soul food' and eats better with foods such as collard greens and cornbread.   Scheduled Medications:  . amLODipine  10 mg Oral Daily  . apixaban  2.5 mg Oral BID  . ertapenem  500 mg Intravenous Q24H  . feeding supplement (ENSURE ENLIVE)  237 mL Oral BID BM  . vancomycin  500 mg Intravenous Q24H    Continuous Medications:  . sodium chloride 30 mL/hr at 06/13/15 1216     Electrolyte/Renal Profile and Glucose Profile:   Recent Labs Lab 06/09/15 1845 06/12/15 1101  NA  --  136  K  --  3.9  CL  --  113*  CO2  --  20*  BUN 19 20  CREATININE 1.31* 1.37*  CALCIUM  --  8.8*  GLUCOSE  --  113*   Protein Profile:  Recent  Labs Lab 06/12/15 1101  ALBUMIN 2.7*    Gastrointestinal Profile: Last BM:  06/12/2015   Nutrition-Focused Physical Exam Findings:  Unable to complete Nutrition-Focused physical exam at this time.    Weight Change: Unable to clarify on visit.   Skin:   (Stage III coccyx pressure ulcer)   Height:   Ht Readings from Last 1 Encounters:  06/12/15 5\' 6"  (1.676 m)    Weight:   Wt Readings from Last 1 Encounters:  06/12/15 164 lb 11.2 oz (74.707 kg)    BMI:  Body mass index is 26.6 kg/(m^2).  Estimated Nutritional Needs:   Kcal:  BEE: 1016kcals, TEE: (IF 1.2-1.4)(AF 1.2) 1464-1708kcals, using IBW of 59kg  Protein:  70-88g protein (1.2-1.5g/kg)  Fluid:  1475-176570mL of fluid (25-6230mL/kg)  EDUCATION NEEDS:   No education needs identified at this time   MODERATE Care Level  Leda QuailAllyson Amauria Younts, RD, LDN Pager [phone] Weekend/On-Call Pager [phone]

## 2015-06-13 NOTE — Progress Notes (Signed)
Ertapenem renally adjusted to 500 mg IV q24 hours due to Crcl <30 ml/min.

## 2015-06-13 NOTE — Care Management (Signed)
Physical therapy evaluation completed. Recommending no therapy in the home. Will need lift. Already has hospital bed. Good family support. Gwenette GreetBrenda S Bijou Easler RN MSN CCM Care Management [phone]

## 2015-06-14 DIAGNOSIS — L8990 Pressure ulcer of unspecified site, unspecified stage: Secondary | ICD-10-CM | POA: Insufficient documentation

## 2015-06-14 LAB — CREATININE, SERUM
Creatinine, Ser: 1.37 mg/dL — ABNORMAL HIGH (ref 0.44–1.00)
GFR calc Af Amer: 38 mL/min — ABNORMAL LOW (ref >60)
GFR, EST NON AFRICAN AMERICAN: 32 mL/min — AB (ref >60)

## 2015-06-14 MED ORDER — QUETIAPINE FUMARATE 25 MG PO TABS
25.0000 mg | ORAL_TABLET | Freq: Every day | ORAL | Status: DC
  Administered 2015-06-14: 25 mg via ORAL
  Filled 2015-06-14: qty 1

## 2015-06-14 NOTE — Progress Notes (Signed)
ANTIBIOTIC CONSULT NOTE - FOLLOW UP  Pharmacy Consult for Vancomcyin  Indication: wound infection, possible osteomyelitis  No Known Allergies  Patient Measurements: Height: 5\' 6"  (167.6 cm) Weight: 164 lb 11.2 oz (74.707 kg) IBW/kg (Calculated) : 59.3 Adjusted Body Weight: 70.06 kg   Vital Signs: Temp: 98.1 F (36.7 C) (03/24 0502) Temp Source: Axillary (03/24 0437) BP: 151/65 mmHg (03/24 0502) Pulse Rate: 69 (03/24 0502) Intake/Output from previous day: 03/23 0701 - 03/24 0700 In: -  Out: 300 [Urine:300] Intake/Output from this shift:    Labs:  Recent Labs  06/12/15 1101 06/14/15 0501  WBC 5.5  --   HGB 8.0*  --   PLT 269  --   CREATININE 1.37* 1.37*   Estimated Creatinine Clearance: 27.1 mL/min (by C-G formula based on Cr of 1.37).  Recent Labs  06/12/15 1212 06/13/15 2144  VANCOTROUGH  --  20  VANCORANDOM 20  --      Microbiology: Recent Results (from the past 720 hour(s))  Urine culture     Status: None (Preliminary result)   Collection Time: 06/12/15 11:01 AM  Result Value Ref Range Status   Specimen Description URINE, RANDOM  Final   Special Requests NONE  Final   Culture   Final    80,000 COLONIES/ml YEAST IDENTIFICATION TO FOLLOW    Report Status PENDING  Incomplete  Culture, blood (routine x 2)     Status: None (Preliminary result)   Collection Time: 06/12/15 11:01 AM  Result Value Ref Range Status   Specimen Description BLOOD CENTRAL LINE  Final   Special Requests BOTTLES DRAWN AEROBIC AND ANAEROBIC  5CC  Final   Culture NO GROWTH < 24 HOURS  Final   Report Status PENDING  Incomplete  Culture, blood (routine x 2)     Status: None (Preliminary result)   Collection Time: 06/12/15 12:12 PM  Result Value Ref Range Status   Specimen Description BLOOD LEFT HAND  Final   Special Requests   Final    BOTTLES DRAWN AEROBIC AND ANAEROBIC AERO 3CC ANA 8CC   Culture NO GROWTH < 24 HOURS  Final   Report Status PENDING  Incomplete  Wound culture      Status: None (Preliminary result)   Collection Time: 06/12/15  5:34 PM  Result Value Ref Range Status   Specimen Description ULCER  Final   Special Requests NONE  Final   Gram Stain PENDING  Incomplete   Culture TOO YOUNG TO READ  Final   Report Status PENDING  Incomplete    Medical History: Past Medical History  Diagnosis Date  . Dementia   . Osteomyelitis (HCC)     sacral wound  . Hypertension     Medications:  Prescriptions prior to admission  Medication Sig Dispense Refill Last Dose  . acetaminophen (TYLENOL) 500 MG tablet Take 500 mg by mouth every 8 (eight) hours as needed for mild pain.   06/11/2015 at 1700  . allopurinol (ZYLOPRIM) 100 MG tablet Take 100 mg by mouth daily.     Marland Kitchen. apixaban (ELIQUIS) 2.5 MG TABS tablet Take 2.5 mg by mouth 2 (two) times daily.     . ertapenem (INVANZ) 1 g injection Inject 1 g into the muscle every evening. Reported on 06/12/2015     . HEPARIN SODIUM, PORCINE, IV Inject 2 mLs into the vein every Monday, Wednesday, and Friday. Heparin, porcine 100 units/mL.   06/10/2015 at unknown   . ibuprofen (ADVIL,MOTRIN) 600 MG tablet Take 600 mg by  mouth every 6 (six) hours as needed for mild pain.   06/11/2015 at 1030  . Vancomycin HCl in NaCl 1-0.9 GM/250ML-% SOLN Inject 500 g into the vein every evening.   06/11/2015 at Unknown time  . amLODipine (NORVASC) 10 MG tablet Take 10 mg by mouth daily. Reported on 06/12/2015    at Unknown time  . polyethylene glycol (MIRALAX / GLYCOLAX) packet Take 17 g by mouth daily as needed for mild constipation. Reported on 06/12/2015    at Unknown time   Assessment: Pharmacy consulted to dose vancomycin in this 82 year old female admitted with wound infection, possible osteomyelitis. Patient was started on ertapenem and Vancomycin at an outside facility. ID following. Per ID Physician: No evidence infection as cause of her recent admission. Will continue abxs per [facility] dc summary.   Goal of Therapy:  Vancomycin trough level  15-20 mcg/ml  Plan:  Expected duration 10 days with resolution of temperature and/or normalization of WBC   Will continue Vancomycin 500 mg IV q24 hours. Will need to continue following patient's renal function closely. Last trough level was 20 mcg/ml.   Carsyn Taubman D 06/14/2015,8:54 AM

## 2015-06-14 NOTE — Care Management Important Message (Signed)
Important Message  Patient Details  Name: Jasmine Briggs MRN: [id number] Date of Birth: 05/05/1920   Medicare Important Message Given:  Yes    Olegario MessierKathy A Vinia Jemmott 06/14/2015, 11:22 AM

## 2015-06-14 NOTE — Progress Notes (Addendum)
Infectious Disease Long Term IV Antibiotic Orders  Diagnosis: Sacral osteomyelitis  Culture results None available  Allergies: No Known Allergies  Discharge antibiotics Vancomycin          500        mg  every      24         hours .     Goal vancomycin trough 15-20.    Pharmacy to adjust dosing based on levels Ertapenem     1000  Mg every  24 hours PICC Care per protocol Labs weekly while on IV antibiotics      CBC w diff   Comprehensive met panel Vancomycin Trough     THESE ARE TEMPORARY ORDERS TO ASSIST WITH TRANSFER OF HOME HEALTH SERVICES TO A NEW AGENCY.  PATIENT AND HOME HEALTH AGENCY WILL NEED TO CONTACT THE ORIGINAL ORDERING PHYSICIAN FROM HER RECENT [facility] ADMISSION SHE WILL NEED TO FOLLOW UP WITH THE ORIGINAL PHYSICIAN.  I WILL NOT BE SEEING AS OUTPATIENT NOR FOLLOWING HER LABS  PLEASE ENSURE NEW ORDERS ARE OBTAINED FROM [facility] PHYSICIAN WITHIN 4 DAYS  THESE ORDERS EXPIRE ON MARCH 28TH, 2017 Leonel Ramsay, MD

## 2015-06-14 NOTE — Progress Notes (Signed)
Patient ID: Jasmine Briggs, female   DOB: 1920-05-19, 82 y.o.   MRN: [id number] [facility] PROGRESS NOTE  Jasmine Briggs WUJ:[id number] DOB: 1920-12-01 DOA: 06/12/2015 PCP: No primary care provider on file.  HPI/Subjective: Patient seen on 3 different occasions today. She was sleeping during the entire time that I was in the room. I really got a response out of her when I checked a Babinski. Family states that she is up all night and sleeps during the day.  Objective: Filed Vitals:   06/14/15 0858 06/14/15 1303  BP: 136/66 148/71  Pulse: 70 71  Temp: 97.7 F (36.5 C) 97.5 F (36.4 C)  Resp: 17 20    Filed Weights   06/12/15 1051 06/12/15 2052  Weight: 86.183 kg (190 lb) 74.707 kg (164 lb 11.2 oz)    ROS: Review of Systems  Unable to perform ROS  secondary to altered mental status. Exam: Physical Exam  Constitutional: She appears lethargic.  HENT:  Nose: No mucosal edema.  Mouth/Throat: No oropharyngeal exudate or posterior oropharyngeal edema.  Eyes: Conjunctivae and lids are normal.  Neck: No JVD present. Carotid bruit is not present. No edema present. No thyroid mass and no thyromegaly present.  Cardiovascular: S1 normal and S2 normal.  Exam reveals no gallop.   No murmur heard. Pulses:      Dorsalis pedis pulses are 2+ on the right side, and 2+ on the left side.  Respiratory: No respiratory distress. She has no wheezes. She has no rhonchi. She has no rales.  GI: Soft. Bowel sounds are normal. There is no tenderness.  Musculoskeletal:       Right ankle: She exhibits no swelling.       Left ankle: She exhibits no swelling.  Lymphadenopathy:    She has no cervical adenopathy.  Neurological: She appears lethargic.  Legs contracted  Skin: Skin is warm. Nails show no clubbing.  As per wound care nurse sacral decubiti  Psychiatric:  Unable to assess today secondary to lethargy      Data Reviewed: Basic Metabolic Panel:  Recent Labs Lab 06/09/15 1845  06/12/15 1101 06/14/15 0501  NA  --  136  --   K  --  3.9  --   CL  --  113*  --   CO2  --  20*  --   GLUCOSE  --  113*  --   BUN 19 20  --   CREATININE 1.31* 1.37* 1.37*  CALCIUM  --  8.8*  --    Liver Function Tests:  Recent Labs Lab 06/12/15 1101  AST 39  ALT 31  ALKPHOS 79  BILITOT 0.5  PROT 6.8  ALBUMIN 2.7*   CBC:  Recent Labs Lab 06/09/15 1845 06/12/15 1101  WBC 5.2 5.5  NEUTROABS 3.5  --   HGB 7.7* 8.0*  HCT 23.7* 24.8*  MCV 87.4 87.8  PLT 227 269     Recent Results (from the past 240 hour(s))  Urine culture     Status: None (Preliminary result)   Collection Time: 06/12/15 11:01 AM  Result Value Ref Range Status   Specimen Description URINE, RANDOM  Final   Special Requests NONE  Final   Culture   Final    80,000 COLONIES/ml YEAST IDENTIFICATION TO FOLLOW    Report Status PENDING  Incomplete  Culture, blood (routine x 2)     Status: None (Preliminary result)   Collection Time: 06/12/15 11:01 AM  Result Value Ref Range Status   Specimen  Description BLOOD CENTRAL LINE  Final   Special Requests BOTTLES DRAWN AEROBIC AND ANAEROBIC  5CC  Final   Culture NO GROWTH < 24 HOURS  Final   Report Status PENDING  Incomplete  Culture, blood (routine x 2)     Status: None (Preliminary result)   Collection Time: 06/12/15 12:12 PM  Result Value Ref Range Status   Specimen Description BLOOD LEFT HAND  Final   Special Requests   Final    BOTTLES DRAWN AEROBIC AND ANAEROBIC AERO 3CC ANA 8CC   Culture NO GROWTH < 24 HOURS  Final   Report Status PENDING  Incomplete  Wound culture     Status: None (Preliminary result)   Collection Time: 06/12/15  5:34 PM  Result Value Ref Range Status   Specimen Description ULCER  Final   Special Requests NONE  Final   Gram Stain PENDING  Incomplete   Culture HOLDING FOR POSSIBLE PATHOGEN  Final   Report Status PENDING  Incomplete     Scheduled Meds: . amLODipine  10 mg Oral Daily  . apixaban  2.5 mg Oral BID  .  ertapenem  500 mg Intravenous Q24H  . feeding supplement (ENSURE ENLIVE)  237 mL Oral BID BM  . QUEtiapine  25 mg Oral QHS  . vancomycin  500 mg Intravenous Q24H   Continuous Infusions: . sodium chloride 30 mL/hr at 06/13/15 1216    Assessment/Plan:  1. Acute encephalopathy. I will give a dose of circumflex 1 night so she sleeps at night and hopefully we can have her up during the day. I'm nervous about sending her home today shows she is sleeping all throughout the day and I was unable to get her awake like she was yesterday. 2. Patient being treated for sacral decubitus osteomyelitis [facility] Seen by infectious disease doctors here and continue antibiotics of vancomycin and Invanz 3. Dementia without behavioral disturbance 4. Essential hypertension on Norvasc 5. Anemia of chronic disease 6. Takes Eliquis for DVT upper extremitiy  Code Status:     Code Status Orders        Start     Ordered   06/12/15 2036  Full code   Continuous     06/12/15 2035    Code Status History    Date Active Date Inactive Code Status Order ID Comments User Context   This patient has a current code status but no historical code status.     Family Communication: Family at bedside Disposition Plan: home soon  Consultants:  ID  Palliative care  Antibiotics:  Iv vancomycin  Iv invanz  Time spent: 28 minutes  Alford HighlandWIETING, Taleah Bellantoni  [facility]

## 2015-06-14 NOTE — Care Management (Addendum)
EMS packet created. Requested [employer] from [facility] to be delivered to patient's address. I spoke with [facility] which was providing her home IV ABX (919) [phone]. They are now refusing to take patient back for reasons of "unable to find a home health agency due to safety concerns". Family refusing SNF placement and reserves the idea of keeping patient safe in the home. I have now sent new referral to [facility] pharmacy and home health RN. Will need new orders including home health RN. I have requested new ID orders from Dr. Sampson GoonFitzgerald. I spoke with Dr .Sampson GoonFitzgerald said this patient will need [facility]. I have contacted [facility] to find out treatment regimen prior to this admission, physician name, and next appointment date/time. Prescribing MD is Dr. Cathlean SauerAnne Eichholz [phone] ID contact is [phone]. Patient was on same meds (VANCO 500 mg daily and INVANZ 1 G daily) at home- she should not have any more IV meds at home as they "were mixed weekly for patient on the weekends" per [facility]. [facility] April 3 at 1:00PM. Message left for new orders from Dr. Thurston HoleAnne Eichholz's nurse Darl PikesJonna Pierce regarding need for continued IV medication order. [facility] (partner of Dr. Chip BoerEichholz) and patient was on Vancomycin 1G daily and Invanz 1 G Daily. I have asked Dr. Sampson GoonFitzgerald to change Pincus Sanesnvanz order to 1 G. I've learned that patient has IV meds in refigerator at home. Family will need to continue infusions like before until meds are gone- then [facility] will order more meds. They will not be able to administer meds provided from [facility]. I have notified [facility] that patient's medication (that she has) expires on 05/19/15. Family is able to administer IV therapy. [facility] "because they were not drawing labs correctly" per [facility] and they were not able to find another home  health RN. [facility] notified.

## 2015-06-14 NOTE — Progress Notes (Signed)
Speech Language Pathology Treatment: Dysphagia  Patient Details Name: Jasmine Briggs MRN: [id number] DOB: [birth year] Today's Date: 06/14/2015 Time: 1130-1200 SLP Time Calculation (min) (ACUTE ONLY): 30 min  Assessment / Plan / Recommendation Clinical Impression  Pt appears to be tolerating the currently ordered Dys. 1 (puree) diet w/ thin liquids w/ no reported s/s of aspiration by staff and Son/family; family stated pt ate "a lot last night". Pt requires total assistance w/ feeding; continues to be drowsy often - suspect d/t baseline Dementia and cognitive decline while being sick/hospitalized. Son did state that pt does sleep "often".  Due to pt's current presentation and baseline Cognitive status; rec. continue w/ a Dys. 1 diet w/ thin liquids w/ strict aspiration precautions and feeding assistance to provide support during oral intake; Dietician f/u for supplements as indicated. Education on aspiration precautions and food consistency and prep; options.     HPI HPI: Pt is a 82 y.o. female with a known history of blindness, Dementia, and bedbound status being cared by at home, recent admission to an outside hospital for sacral decub ulcer and possible osteomyelitis then discharged on IV vancomycin and Invanz. She was brought in by family secondary to altered mental status. Pt has been combative but is calm now. She is more drowsy this morning but has been alert to eat w/ family who feeds her (baseline). Pt tolerated the rec'd pureed diet w/ thin liquids last pm per family report. Family stated pt was at her baseline w/ them last evening.       SLP Plan  Continue with current plan of care     Recommendations  Diet recommendations: Dysphagia 1 (puree);Thin liquid Liquids provided via: Straw Medication Administration: Crushed with puree Supervision: Full supervision/cueing for compensatory strategies;Trained caregiver to feed patient Compensations: Minimize environmental distractions;Slow  rate;Small sips/bites;Follow solids with liquid (check for oral clearing as indicated) Postural Changes and/or Swallow Maneuvers: Seated upright 90 degrees;Upright 30-60 min after meal             General recommendations:  (Dietician f/u as indicated) Oral Care Recommendations: Oral care BID;Staff/trained caregiver to provide oral care Follow up Recommendations: None Plan: Continue with current plan of care     GO               Jerilynn SomKatherine Watson, MS, CCC-SLP  Watson,Katherine 06/14/2015, 2:22 PM

## 2015-06-15 LAB — URINE CULTURE: Culture: 80000

## 2015-06-15 LAB — BASIC METABOLIC PANEL
Anion gap: 2 — ABNORMAL LOW (ref 5–15)
BUN: 23 mg/dL — AB (ref 6–20)
CALCIUM: 8.6 mg/dL — AB (ref 8.9–10.3)
CHLORIDE: 119 mmol/L — AB (ref 101–111)
CO2: 20 mmol/L — ABNORMAL LOW (ref 22–32)
Creatinine, Ser: 1.37 mg/dL — ABNORMAL HIGH (ref 0.44–1.00)
GFR calc Af Amer: 38 mL/min — ABNORMAL LOW (ref >60)
GFR, EST NON AFRICAN AMERICAN: 32 mL/min — AB (ref >60)
GLUCOSE: 102 mg/dL — AB (ref 65–99)
POTASSIUM: 4.2 mmol/L (ref 3.5–5.1)
SODIUM: 141 mmol/L (ref 135–145)

## 2015-06-15 LAB — CBC
HCT: 24.3 % — ABNORMAL LOW (ref 35.0–47.0)
HEMOGLOBIN: 8 g/dL — AB (ref 12.0–16.0)
MCH: 28.5 pg (ref 26.0–34.0)
MCHC: 33 g/dL (ref 32.0–36.0)
MCV: 86.4 fL (ref 80.0–100.0)
Platelets: 284 10*3/uL (ref 150–440)
RBC: 2.81 MIL/uL — AB (ref 3.80–5.20)
RDW: 17.8 % — ABNORMAL HIGH (ref 11.5–14.5)
WBC: 5.8 10*3/uL (ref 3.6–11.0)

## 2015-06-15 MED ORDER — VANCOMYCIN HCL 500 MG IV SOLR: 500.0000 mg | INTRAVENOUS | Status: DC

## 2015-06-15 MED ORDER — QUETIAPINE FUMARATE 25 MG PO TABS: 25.0000 mg | ORAL_TABLET | Freq: Every day | ORAL | Status: DC

## 2015-06-15 MED ORDER — ERTAPENEM SODIUM 1 G IJ SOLR: 1.0000 g | Freq: Every evening | INTRAMUSCULAR | Status: DC

## 2015-06-15 MED ORDER — ENSURE ENLIVE PO LIQD: 237.0000 mL | Freq: Two times a day (BID) | ORAL | Status: DC

## 2015-06-15 NOTE — Care Management Note (Signed)
Case Management Note  Patient Details  Name: Jasmine Briggs MRN: [id number] Date of Birth: 10-31-1920  Subjective/Objective:       A MD order for a [employer]iel Sites[employer]oyer lift and a semi-electric hospital bed has been faxed to [facility].             Action/Plan:   Expected Discharge Date:                  Expected Discharge Plan:     In-House Referral:     Discharge planning Services     Post Acute Care Choice:    Choice offered to:     DME Arranged:    DME Agency:     HH Arranged:    HH Agency:     Status of Service:     Medicare Important Message Given:  Yes Date Medicare IM Given:    Medicare IM give by:    Date Additional Medicare IM Given:    Additional Medicare Important Message give by:     If discussed at Long Length of Stay Meetings, dates discussed:    Additional Comments:  Miamor Ayler A, RN 06/15/2015, 3:24 PM

## 2015-06-15 NOTE — Discharge Summary (Signed)
[facility] [place] at [facility]   PATIENT NAME: Jasmine Briggs    MR#:  [id number]  DATE OF BIRTH:  1921-03-22  DATE OF ADMISSION:  06/12/2015 ADMITTING PHYSICIAN: Enid Baas, MD  DATE OF DISCHARGE: 06/15/15 PRIMARY CARE PHYSICIAN: No primary care provider on file.    ADMISSION DIAGNOSIS:  Delirium [R41.0] Cystitis [N30.90] Sacral decubitus ulcer, unstageable (HCC) [L89.150] Chronic renal insufficiency, unspecified stage [N18.9]  DISCHARGE DIAGNOSIS:  Sacral decub ulcer osteomyelitis  SECONDARY DIAGNOSIS:   Past Medical History  Diagnosis Date  . Dementia   . Osteomyelitis (HCC)     sacral wound  . Hypertension     HOSPITAL COURSE:   1. Acute encephalopathy patient at her baseline. According to the daughter at bedside patient sleeps during  day times and stays awake during the night mostly  2. Patient being treated for sacral decubitus osteomyelitis with IV antibiotics from [facility]. Seen by infectious disease doctors here and continue antibiotics of vancomycin and Invanz.Patient is to get weekly labs by home health nurse which need to be monitored by the [facility] physician  3. Dementia without behavioral disturbance 4. Essential hypertension on Norvasc 5. Anemia of chronic disease 6. Takes Eliquis for DVT upper extremitiy 7.  continue pure diet with honey thick liquids, Foley catheter  8.  will provide hospital bed and hoyr lift  DISCHARGE CONDITIONS:   fair  CONSULTS OBTAINED:  Treatment Team:  Clydie Braun, MD   PROCEDURES picc line  DRUG ALLERGIES:  No Known Allergies  DISCHARGE MEDICATIONS:   Current Discharge Medication List    START taking these medications   Details  feeding supplement, ENSURE ENLIVE, (ENSURE ENLIVE) LIQD Take 237 mLs by mouth 2 (two) times daily between meals. Qty: 237 mL, Refills: 12    QUEtiapine (SEROQUEL) 25 MG tablet Take 1 tablet (25 mg total) by mouth at bedtime. Qty: 30 tablet, Refills: 0    vancomycin 500 mg in sodium chloride 0.9 % 100 mL Inject 500 mg into the vein daily. Qty: 15 Dose, Refills: 0      CONTINUE these medications which have CHANGED   Details  ertapenem (INVANZ) 1 g injection Inject 1 g into the muscle every evening. Reported on 06/12/2015 Qty: 15 each, Refills: 0      CONTINUE these medications which have NOT CHANGED   Details  acetaminophen (TYLENOL) 500 MG tablet Take 500 mg by mouth every 8 (eight) hours as needed for mild pain.    allopurinol (ZYLOPRIM) 100 MG tablet Take 100 mg by mouth daily.    apixaban (ELIQUIS) 2.5 MG TABS tablet Take 2.5 mg by mouth 2 (two) times daily.    HEPARIN SODIUM, PORCINE, IV Inject 2 mLs into the vein every Monday, Wednesday, and Friday. Heparin, porcine 100 units/mL.    ibuprofen (ADVIL,MOTRIN) 600 MG tablet Take 600 mg by mouth every 6 (six) hours as needed for mild pain.    amLODipine (NORVASC) 10 MG tablet Take 10 mg by mouth daily. Reported on 06/12/2015    polyethylene glycol (MIRALAX / GLYCOLAX) packet Take 17 g by mouth daily as needed for mild constipation. Reported on 06/12/2015      STOP taking these medications     Vancomycin HCl in NaCl 1-0.9 GM/250ML-% SOLN          DISCHARGE INSTRUCTIONS:  Reposition patient every 2 hours Follow-up with primary care physician and infectious disease at [facility] in a week Continue IV antibiotics on daily basis and get weekly labs including  vancomycin peak and trough by home health RN, fax to [facility] physician to monitor Continue Foley catheter Pured diet with honey thick liquids    DIET:  Puree diet, honey thin liquids  DISCHARGE CONDITION:  Fair  ACTIVITY:  Bedrest  OXYGEN:  Home Oxygen: No.   Oxygen Delivery: room air  DISCHARGE LOCATION:  home   If you experience worsening of your admission symptoms, develop shortness of breath, life threatening emergency, suicidal or homicidal thoughts you must seek medical attention immediately by calling 911 or  calling your MD immediately  if symptoms less severe.  You Must read complete instructions/literature along with all the possible adverse reactions/side effects for all the Medicines you take and that have been prescribed to you. Take any new Medicines after you have completely understood and accpet all the possible adverse reactions/side effects.   Please note  You were cared for by a hospitalist during your hospital stay. If you have any questions about your discharge medications or the care you received while you were in the hospital after you are discharged, you can call the unit and asked to speak with the hospitalist on call if the hospitalist that took care of you is not available. Once you are discharged, your primary care physician will handle any further medical issues. Please note that NO REFILLS for any discharge medications will be authorized once you are discharged, as it is imperative that you return to your primary care physician (or establish a relationship with a primary care physician if you do not have one) for your aftercare needs so that they can reassess your need for medications and monitor your lab values.     Today  Chief Complaint  Patient presents with  . Altered Mental Status    patient is arousable during my examination. Smiles. Poor historian. According to her daughter patient is at her baseline  ROS:   patient is a poor historian   VITAL SIGNS:  Blood pressure 143/69, pulse 77, temperature 98.6 F (37 C), temperature source Oral, resp. rate 22, height  (1.676 m), weight 74.707 kg (164 lb 11.2 oz), SpO2 97 %.  I/O:   Intake/Output Summary (Last 24 hours) at 06/15/15 1432 Last data filed at 06/15/15 1343  Gross per 24 hour  Intake   2995 ml  Output    200 ml  Net   2795 ml    PHYSICAL EXAMINATION:  GENERAL:  82 y.o.-year-old patient lying in the bed with no acute distress.  EYES: Pupils equal, round, reactive to light and accommodation. No  scleral icterus. Extraocular muscles intact.  HEENT: Head atraumatic, normocephalic. Oropharynx and nasopharynx clear.  NECK:  Supple, no jugular venous distention. No thyroid enlargement, no tenderness.  LUNGS: Normal breath sounds bilaterally, no wheezing, rales,rhonchi or crepitation. No use of accessory muscles of respiration.  CARDIOVASCULAR: S1, S2 normal. No murmurs, rubs, or gallops.  ABDOMEN: Soft, non-tender, non-distended. Bowel sounds present. No organomegaly or mass.  EXTREMITIES:  sacral decub  ulcer -No pedal edema, cyanosis, or clubbing.  NEUROLOGIC: Cranial nerves II through XII are intact. Muscle strength 5/5 in all extremities. Sensation intact. Gait not checked.  PSYCHIATRIC: The patient is with dementia.  SKIN: sacral decu ulcer    DATA REVIEW:   CBC  Recent Labs Lab 06/15/15 0550  WBC 5.8  HGB 8.0*  HCT 24.3*  PLT 284    Chemistries   Recent Labs Lab 06/12/15 1101  06/15/15 0550  NA 136  --  141  K 3.9  --  4.2  CL 113*  --  119*  CO2 20*  --  20*  GLUCOSE 113*  --  102*  BUN 20  --  23*  CREATININE 1.37*  < > 1.37*  CALCIUM 8.8*  --  8.6*  AST 39  --   --   ALT 31  --   --   ALKPHOS 79  --   --   BILITOT 0.5  --   --   < > = values in this interval not displayed.  Cardiac Enzymes No results for input(s): TROPONINI in the last 168 hours.  Microbiology Results  Results for orders placed or performed during the hospital encounter of 06/12/15  Urine culture     Status: None   Collection Time: 06/12/15 11:01 AM  Result Value Ref Range Status   Specimen Description URINE, RANDOM  Final   Special Requests NONE  Final   Culture 80,000 COLONIES/ml CANDIDA ALBICANS  Final   Report Status 06/15/2015 FINAL  Final  Culture, blood (routine x 2)     Status: None (Preliminary result)   Collection Time: 06/12/15 11:01 AM  Result Value Ref Range Status   Specimen Description BLOOD CENTRAL LINE  Final   Special Requests BOTTLES DRAWN AEROBIC AND  ANAEROBIC  5CC  Final   Culture NO GROWTH 3 DAYS  Final   Report Status PENDING  Incomplete  Culture, blood (routine x 2)     Status: None (Preliminary result)   Collection Time: 06/12/15 12:12 PM  Result Value Ref Range Status   Specimen Description BLOOD LEFT HAND  Final   Special Requests   Final    BOTTLES DRAWN AEROBIC AND ANAEROBIC AERO 3CC ANA 8CC   Culture NO GROWTH 3 DAYS  Final   Report Status PENDING  Incomplete  Wound culture     Status: None (Preliminary result)   Collection Time: 06/12/15  5:34 PM  Result Value Ref Range Status   Specimen Description ULCER  Final   Special Requests NONE  Final   Gram Stain RARE WBC SEEN RARE GRAM NEGATIVE RODS   Final   Culture   Final    LIGHT GROWTH GRAM NEGATIVE RODS IDENTIFICATION AND SUSCEPTIBILITIES TO FOLLOW    Report Status PENDING  Incomplete    RADIOLOGY:  Ct Head Wo Contrast  06/12/2015  CLINICAL DATA:  Altered mental status EXAM: CT HEAD WITHOUT CONTRAST TECHNIQUE: Contiguous axial images were obtained from the base of the skull through the vertex without intravenous contrast. COMPARISON:  10/31/2013 FINDINGS: The bony calvarium is intact. No gross soft tissue abnormality is noted. Diffuse atrophic changes are seen as well as chronic white matter ischemic change. No findings to suggest acute hemorrhage, acute infarction or space-occupying mass lesion are noted. IMPRESSION: Chronic atrophic and ischemic changes without acute abnormality. Electronically Signed   By: Alcide Clever M.D.   On: 06/12/2015 12:54    EKG:   Orders placed or performed during the hospital encounter of 06/12/15  . ED EKG  . ED EKG      Management plans discussed with the patient, family and they are in agreement.  CODE STATUS:     Code Status Orders        Start     Ordered   06/12/15 2036  Full code   Continuous     06/12/15 2035    Code Status History    Date Active Date Inactive Code Status Order ID Comments User  Context   This  patient has a current code status but no historical code status.      TOTAL TIME TAKING CARE OF THIS PATIENT: 45 minutes.    @[facility] @  on 06/15/2015 at 2:32 PM  Between 7am to 6pm - Pager - [phone]  After 6pm go to [email or web address] - password [email or web address] [facility]  [facility]  Office  [phone]  CC: Primary care physician; No primary care provider on file.

## 2015-06-15 NOTE — Progress Notes (Signed)
MD order received to discharge pt home with home health today; Care Management previously established home health services with [facility] including durable medical equipment (hospital bed and lift) and RN services; verbally reviewed AVS with pt's son, Lyndal RainbowKenneth Pospisil including medications and follow up appointment with Dr. Cathlean SauerAnne Eichholz on 06/24/15 at 1pm, no questions voiced at this time; EMS contacted for nonemergency transport; pt's discharge pending arrival of EMS personnel for discharge home

## 2015-06-15 NOTE — Discharge Instructions (Signed)
Reposition patient every 2 hours Follow[facility] and [facility] Continue IV antibiotics on daily basis and get weekly labs including vancomycin peak and trough by home health RN, [facility] to [facility] Continue Foley catheter Pured diet with honey thick liquids

## 2015-06-15 NOTE — Care Management Note (Signed)
Case Management Note  Patient Details  Name: Jasmine Briggs MRN: [id number] Date of Birth: 19-Oct-1920  Subjective/Objective:        A referral and a copy of Dr Jarrett AblesFitzgerald's home IV antibiotic orders was faxed to [facility]. A referral was faxed to [facility] requesting a home health RN. Jasmine Briggs has a single lumen PICC in her chest. Family reports that son Jasmine Briggs administers Jasmine Briggs's IV antibiotics every 24 hours. Any home equipment that Jasmine Briggs needs can be arranged through [facility]. The [facility] RN will assess the home situation and request any needed equipment.               Action/Plan:   Expected Discharge Date:                  Expected Discharge Plan:     In-House Referral:     Discharge planning Services     Post Acute Care Choice:    Choice offered to:     DME Arranged:    DME Agency:     HH Arranged:    HH Agency:     Status of Service:     Medicare Important Message Given:  Yes Date Medicare IM Given:    Medicare IM give by:    Date Additional Medicare IM Given:    Additional Medicare Important Message give by:     If discussed at Long Length of Stay Meetings, dates discussed:    Additional Comments:  Zaniah Titterington A, RN 06/15/2015, 12:37 PM

## 2015-06-15 NOTE — Progress Notes (Signed)
EMS present for pt discharge; discharge packet given to EMS personnel to take to the pt's home; pt discharged via stretcher by EMS personnel

## 2015-06-17 LAB — WOUND CULTURE

## 2015-06-17 LAB — CULTURE, BLOOD (ROUTINE X 2)
CULTURE: NO GROWTH
Culture: NO GROWTH

## 2015-06-17 NOTE — Care Management (Signed)
Post discharge: Received call from patient's son stating that they received a [employer] Lift but the bed came without a mattress so family told them to take bed back. He wants an alternating pressure pad. I have requested Rx from Dr. Amado CoeGouru. I have notified patient's son and Will with [facility].

## 2015-06-21 ENCOUNTER — Encounter: Payer: Self-pay | Admitting: Emergency Medicine

## 2015-06-21 ENCOUNTER — Inpatient Hospital Stay
Admission: EM | Admit: 2015-06-21 | Discharge: 2015-06-26 | DRG: 871 | Disposition: A | Discharge status: Home Health | Payer: Medicare Other | Attending: Internal Medicine | Admitting: Internal Medicine

## 2015-06-21 ENCOUNTER — Emergency Department: Payer: Medicare Other

## 2015-06-21 DIAGNOSIS — M109 Gout, unspecified: Secondary | ICD-10-CM | POA: Diagnosis present

## 2015-06-21 DIAGNOSIS — L89154 Pressure ulcer of sacral region, stage 4: Secondary | ICD-10-CM | POA: Diagnosis present

## 2015-06-21 DIAGNOSIS — F0390 Unspecified dementia without behavioral disturbance: Secondary | ICD-10-CM | POA: Diagnosis present

## 2015-06-21 DIAGNOSIS — R042 Hemoptysis: Secondary | ICD-10-CM | POA: Diagnosis present

## 2015-06-21 DIAGNOSIS — Z515 Encounter for palliative care: Secondary | ICD-10-CM | POA: Diagnosis not present

## 2015-06-21 DIAGNOSIS — E1169 Type 2 diabetes mellitus with other specified complication: Secondary | ICD-10-CM | POA: Diagnosis present

## 2015-06-21 DIAGNOSIS — D638 Anemia in other chronic diseases classified elsewhere: Secondary | ICD-10-CM | POA: Diagnosis present

## 2015-06-21 DIAGNOSIS — Z86718 Personal history of other venous thrombosis and embolism: Secondary | ICD-10-CM | POA: Diagnosis not present

## 2015-06-21 DIAGNOSIS — N179 Acute kidney failure, unspecified: Secondary | ICD-10-CM | POA: Diagnosis present

## 2015-06-21 DIAGNOSIS — Z66 Do not resuscitate: Secondary | ICD-10-CM | POA: Diagnosis not present

## 2015-06-21 DIAGNOSIS — Z7401 Bed confinement status: Secondary | ICD-10-CM | POA: Diagnosis not present

## 2015-06-21 DIAGNOSIS — E860 Dehydration: Secondary | ICD-10-CM | POA: Diagnosis present

## 2015-06-21 DIAGNOSIS — N189 Chronic kidney disease, unspecified: Secondary | ICD-10-CM | POA: Diagnosis present

## 2015-06-21 DIAGNOSIS — J189 Pneumonia, unspecified organism: Secondary | ICD-10-CM

## 2015-06-21 DIAGNOSIS — R1310 Dysphagia, unspecified: Secondary | ICD-10-CM | POA: Diagnosis present

## 2015-06-21 DIAGNOSIS — R627 Adult failure to thrive: Secondary | ICD-10-CM | POA: Diagnosis present

## 2015-06-21 DIAGNOSIS — N390 Urinary tract infection, site not specified: Secondary | ICD-10-CM

## 2015-06-21 DIAGNOSIS — Z7901 Long term (current) use of anticoagulants: Secondary | ICD-10-CM | POA: Diagnosis not present

## 2015-06-21 DIAGNOSIS — I129 Hypertensive chronic kidney disease with stage 1 through stage 4 chronic kidney disease, or unspecified chronic kidney disease: Secondary | ICD-10-CM | POA: Diagnosis present

## 2015-06-21 DIAGNOSIS — M4628 Osteomyelitis of vertebra, sacral and sacrococcygeal region: Secondary | ICD-10-CM | POA: Diagnosis present

## 2015-06-21 DIAGNOSIS — E1122 Type 2 diabetes mellitus with diabetic chronic kidney disease: Secondary | ICD-10-CM | POA: Diagnosis present

## 2015-06-21 DIAGNOSIS — Z79899 Other long term (current) drug therapy: Secondary | ICD-10-CM | POA: Diagnosis not present

## 2015-06-21 DIAGNOSIS — J690 Pneumonitis due to inhalation of food and vomit: Secondary | ICD-10-CM | POA: Diagnosis present

## 2015-06-21 DIAGNOSIS — M1990 Unspecified osteoarthritis, unspecified site: Secondary | ICD-10-CM | POA: Diagnosis present

## 2015-06-21 DIAGNOSIS — J9601 Acute respiratory failure with hypoxia: Secondary | ICD-10-CM | POA: Diagnosis present

## 2015-06-21 DIAGNOSIS — Z6827 Body mass index (BMI) 27.0-27.9, adult: Secondary | ICD-10-CM | POA: Diagnosis not present

## 2015-06-21 DIAGNOSIS — A419 Sepsis, unspecified organism: Secondary | ICD-10-CM | POA: Diagnosis present

## 2015-06-21 DIAGNOSIS — Z87891 Personal history of nicotine dependence: Secondary | ICD-10-CM

## 2015-06-21 DIAGNOSIS — Z833 Family history of diabetes mellitus: Secondary | ICD-10-CM

## 2015-06-21 DIAGNOSIS — B3749 Other urogenital candidiasis: Secondary | ICD-10-CM | POA: Diagnosis not present

## 2015-06-21 DIAGNOSIS — E43 Unspecified severe protein-calorie malnutrition: Secondary | ICD-10-CM | POA: Diagnosis present

## 2015-06-21 DIAGNOSIS — J181 Lobar pneumonia, unspecified organism: Secondary | ICD-10-CM

## 2015-06-21 HISTORY — DX: Type 2 diabetes mellitus without complications: E11.9

## 2015-06-21 HISTORY — DX: Unspecified osteoarthritis, unspecified site: M19.90

## 2015-06-21 LAB — URINALYSIS COMPLETE WITH MICROSCOPIC (ARMC ONLY)
Bilirubin Urine: NEGATIVE
Glucose, UA: 50 mg/dL — AB
KETONES UR: NEGATIVE mg/dL
Nitrite: NEGATIVE
PH: 5 (ref 5.0–8.0)
PROTEIN: 100 mg/dL — AB
SPECIFIC GRAVITY, URINE: 1.019 (ref 1.005–1.030)
Squamous Epithelial / LPF: NONE SEEN

## 2015-06-21 LAB — PH, GASTRIC FLUID (GASTROCCULT CARD): PH, GASTRIC: 6

## 2015-06-21 LAB — CBC WITH DIFFERENTIAL/PLATELET
BASOS ABS: 0 10*3/uL (ref 0–0.1)
Basophils Relative: 0 %
Eosinophils Absolute: 0 10*3/uL (ref 0–0.7)
Eosinophils Relative: 0 %
HCT: 23.4 % — ABNORMAL LOW (ref 35.0–47.0)
Hemoglobin: 7.5 g/dL — ABNORMAL LOW (ref 12.0–16.0)
LYMPHS ABS: 0.4 10*3/uL — AB (ref 1.0–3.6)
Lymphocytes Relative: 2 %
MCH: 27.6 pg (ref 26.0–34.0)
MCHC: 32 g/dL (ref 32.0–36.0)
MCV: 86.4 fL (ref 80.0–100.0)
MONO ABS: 1.2 10*3/uL — AB (ref 0.2–0.9)
Monocytes Relative: 6 %
NEUTROS ABS: 19 10*3/uL — AB (ref 1.4–6.5)
Neutrophils Relative %: 92 %
PLATELETS: 264 10*3/uL (ref 150–440)
RBC: 2.71 MIL/uL — ABNORMAL LOW (ref 3.80–5.20)
RDW: 18.8 % — AB (ref 11.5–14.5)
WBC: 20.6 10*3/uL — ABNORMAL HIGH (ref 3.6–11.0)

## 2015-06-21 LAB — COMPREHENSIVE METABOLIC PANEL
ALBUMIN: 2.3 g/dL — AB (ref 3.5–5.0)
ALT: 19 U/L (ref 14–54)
AST: 19 U/L (ref 15–41)
Alkaline Phosphatase: 63 U/L (ref 38–126)
Anion gap: 3 — ABNORMAL LOW (ref 5–15)
BUN: 27 mg/dL — AB (ref 6–20)
CHLORIDE: 116 mmol/L — AB (ref 101–111)
CO2: 22 mmol/L (ref 22–32)
Calcium: 9.5 mg/dL (ref 8.9–10.3)
Creatinine, Ser: 1.11 mg/dL — ABNORMAL HIGH (ref 0.44–1.00)
GFR calc Af Amer: 48 mL/min — ABNORMAL LOW (ref >60)
GFR calc non Af Amer: 42 mL/min — ABNORMAL LOW (ref >60)
GLUCOSE: 154 mg/dL — AB (ref 65–99)
POTASSIUM: 3.5 mmol/L (ref 3.5–5.1)
Sodium: 141 mmol/L (ref 135–145)
Total Bilirubin: 0.4 mg/dL (ref 0.3–1.2)
Total Protein: 5.7 g/dL — ABNORMAL LOW (ref 6.5–8.1)

## 2015-06-21 LAB — BLOOD GAS, ARTERIAL
ACID-BASE DEFICIT: 0.3 mmol/L (ref 0.0–2.0)
BICARBONATE: 22.3 meq/L (ref 21.0–28.0)
FIO2: 0.21
O2 SAT: 90.4 %
PATIENT TEMPERATURE: 37
pCO2 arterial: 28 mmHg — ABNORMAL LOW (ref 32.0–48.0)
pH, Arterial: 7.51 — ABNORMAL HIGH (ref 7.350–7.450)
pO2, Arterial: 53 mmHg — ABNORMAL LOW (ref 83.0–108.0)

## 2015-06-21 LAB — BRAIN NATRIURETIC PEPTIDE: B Natriuretic Peptide: 362 pg/mL — ABNORMAL HIGH (ref 0.0–100.0)

## 2015-06-21 LAB — TROPONIN I: TROPONIN I: 0.04 ng/mL — AB (ref <0.031)

## 2015-06-21 MED ORDER — ACETAMINOPHEN 650 MG RE SUPP: 650.0000 mg | Freq: Four times a day (QID) | RECTAL | Status: DC | PRN

## 2015-06-21 MED ORDER — CHLORHEXIDINE GLUCONATE 0.12 % MT SOLN
15.0000 mL | Freq: Two times a day (BID) | OROMUCOSAL | Status: DC
  Administered 2015-06-21 – 2015-06-26 (×6): 15 mL via OROMUCOSAL
  Filled 2015-06-21: qty 15

## 2015-06-21 MED ORDER — ACETAMINOPHEN 325 MG PO TABS: 650.0000 mg | ORAL_TABLET | Freq: Four times a day (QID) | ORAL | Status: DC | PRN

## 2015-06-21 MED ORDER — HEPARIN SODIUM (PORCINE) 5000 UNIT/ML IJ SOLN: 5000.0000 [IU] | Freq: Three times a day (TID) | INTRAMUSCULAR | Status: DC

## 2015-06-21 MED ORDER — VANCOMYCIN HCL 500 MG IV SOLR
500.0000 mg | INTRAVENOUS | Status: DC
  Filled 2015-06-21: qty 500

## 2015-06-21 MED ORDER — APIXABAN 2.5 MG PO TABS
2.5000 mg | ORAL_TABLET | Freq: Two times a day (BID) | ORAL | Status: DC
  Administered 2015-06-24: 11:00:00 2.5 mg via ORAL
  Filled 2015-06-21: qty 1

## 2015-06-21 MED ORDER — AMLODIPINE BESYLATE 10 MG PO TABS
10.0000 mg | ORAL_TABLET | Freq: Every day | ORAL | Status: DC
  Administered 2015-06-24 – 2015-06-26 (×2): 10 mg via ORAL
  Filled 2015-06-21 (×2): qty 1

## 2015-06-21 MED ORDER — CETYLPYRIDINIUM CHLORIDE 0.05 % MT LIQD
7.0000 mL | Freq: Two times a day (BID) | OROMUCOSAL | Status: DC
  Administered 2015-06-22 – 2015-06-25 (×5): 7 mL via OROMUCOSAL

## 2015-06-21 MED ORDER — ONDANSETRON HCL 4 MG PO TABS: 4.0000 mg | ORAL_TABLET | Freq: Four times a day (QID) | ORAL | Status: DC | PRN

## 2015-06-21 MED ORDER — QUETIAPINE FUMARATE 25 MG PO TABS: 25.0000 mg | ORAL_TABLET | Freq: Every day | ORAL | Status: DC

## 2015-06-21 MED ORDER — CEFEPIME HCL 1 G IJ SOLR
1.0000 g | Freq: Two times a day (BID) | INTRAMUSCULAR | Status: DC
  Filled 2015-06-21 (×2): qty 1

## 2015-06-21 MED ORDER — DEXTROSE 5 % IV SOLN
1.0000 g | Freq: Once | INTRAVENOUS | Status: AC
  Administered 2015-06-21: 22:00:00 1 g via INTRAVENOUS
  Filled 2015-06-21: qty 1

## 2015-06-21 MED ORDER — METRONIDAZOLE IN NACL 5-0.79 MG/ML-% IV SOLN
500.0000 mg | Freq: Once | INTRAVENOUS | Status: AC
  Administered 2015-06-21: 500 mg via INTRAVENOUS
  Filled 2015-06-21: qty 100

## 2015-06-21 MED ORDER — ONDANSETRON HCL 4 MG/2ML IJ SOLN: 4.0000 mg | Freq: Four times a day (QID) | INTRAMUSCULAR | Status: DC | PRN

## 2015-06-21 MED ORDER — ALLOPURINOL 100 MG PO TABS
100.0000 mg | ORAL_TABLET | Freq: Every day | ORAL | Status: DC
  Administered 2015-06-24 – 2015-06-26 (×2): 100 mg via ORAL
  Filled 2015-06-21 (×4): qty 1

## 2015-06-21 MED ORDER — METRONIDAZOLE IN NACL 5-0.79 MG/ML-% IV SOLN
500.0000 mg | Freq: Three times a day (TID) | INTRAVENOUS | Status: DC
  Administered 2015-06-22 – 2015-06-26 (×14): 500 mg via INTRAVENOUS
  Filled 2015-06-21 (×15): qty 100

## 2015-06-21 NOTE — Progress Notes (Signed)
Pharmacy Antibiotic Note  Jasmine CaraSarah Tulloch is a 82 y.o. female admitted on 06/21/2015 with pneumonia and UTI. Patient was receiving vancomycin 500mg  q24 hours prior to admission for osteomyelitis from sacral decubitus ulcer.  Pharmacy has been consulted for monitoring and dosing of her vancomycin while in the hospital.  Patients family said patient's dose of vancomycin 500mg   was given everyday around 0800- 0830.   Patient is also receiving cefepime and metronidazole per ID's recommendation- for her wound and  suspected aspiration PNA.   Plan: Will continue vancomycin 500mg  IV every 24 hours starting  tomorrow at 0830. Will draw trough level prior to first inpatient dose at 0730. Pharmacy will continue to follow and adjust per consult.     Height: 5\' 7"  (170.2 cm) Weight: 180 lb 5 oz (81.788 kg) IBW/kg (Calculated) : 61.6  Temp (24hrs), Avg:97.7 F (36.5 C), Min:97.7 F (36.5 C), Max:97.7 F (36.5 C)   Recent Labs Lab 06/15/15 0550 06/21/15 1515  WBC 5.8 20.6*  CREATININE 1.37* 1.11*    Estimated Creatinine Clearance: 35.6 mL/min (by C-G formula based on Cr of 1.11).    No Known Allergies  Antimicrobials this admission: Vancomycin  >>  Cefepime >>  Metronidazole>>   Microbiology results: 3/31 BCx: pending 3/31 UCx: pending  Thank you for allowing pharmacy to be a part of this patient's care.  Cher NakaiSheema Charlsey Moragne, PharmD Pharmacy Resident 06/21/2015 7:10 PM

## 2015-06-21 NOTE — Progress Notes (Signed)
ANTIBIOTIC CONSULT NOTE - INITIAL  Pharmacy Consult for Cefepime Indication: pneumonia  No Known Allergies  Patient Measurements: Height: 5\' 7"  (170.2 cm) Weight: 180 lb 5 oz (81.788 kg) IBW/kg (Calculated) : 61.6  Vital Signs: Temp: 97.7 F (36.5 C) (03/31 1339) Temp Source: Axillary (03/31 1339) BP: 154/90 mmHg (03/31 1559) Pulse Rate: 70 (03/31 1559)   Recent Labs  06/21/15 1515  WBC 20.6*  HGB 7.5*  PLT 264  CREATININE 1.11*   Estimated Creatinine Clearance: 35.6 mL/min (by C-G formula based on Cr of 1.11). No results for input(s): VANCOTROUGH, VANCOPEAK, VANCORANDOM, GENTTROUGH, GENTPEAK, GENTRANDOM, TOBRATROUGH, TOBRAPEAK, TOBRARND, AMIKACINPEAK, AMIKACINTROU, AMIKACIN in the last 72 hours.   Microbiology: Recent Results (from the past 720 hour(s))  Urine culture     Status: None   Collection Time: 06/12/15 11:01 AM  Result Value Ref Range Status   Specimen Description URINE, RANDOM  Final   Special Requests NONE  Final   Culture 80,000 COLONIES/ml CANDIDA ALBICANS  Final   Report Status 06/15/2015 FINAL  Final  Culture, blood (routine x 2)     Status: None   Collection Time: 06/12/15 11:01 AM  Result Value Ref Range Status   Specimen Description BLOOD CENTRAL LINE  Final   Special Requests BOTTLES DRAWN AEROBIC AND ANAEROBIC  5CC  Final   Culture NO GROWTH 5 DAYS  Final   Report Status 06/17/2015 FINAL  Final  Culture, blood (routine x 2)     Status: None   Collection Time: 06/12/15 12:12 PM  Result Value Ref Range Status   Specimen Description BLOOD LEFT HAND  Final   Special Requests   Final    BOTTLES DRAWN AEROBIC AND ANAEROBIC AERO 3CC ANA 8CC   Culture NO GROWTH 5 DAYS  Final   Report Status 06/17/2015 FINAL  Final  Wound culture     Status: None   Collection Time: 06/12/15  5:34 PM  Result Value Ref Range Status   Specimen Description ULCER  Final   Special Requests NONE  Final   Gram Stain RARE WBC SEEN RARE GRAM NEGATIVE RODS   Final   Culture   Final    LIGHT GROWTH ESCHERICHIA COLI LIGHT GROWTH PSEUDOMONAS FLUORESCENS LIGHT GROWTH PROTEUS MIRABILIS    Report Status 06/17/2015 FINAL  Final   Organism ID, Bacteria ESCHERICHIA COLI  Final   Organism ID, Bacteria PSEUDOMONAS FLUORESCENS  Final   Organism ID, Bacteria PROTEUS MIRABILIS  Final      Susceptibility   Escherichia coli - MIC*    AMPICILLIN 8 SENSITIVE Sensitive     CEFAZOLIN <=4 SENSITIVE Sensitive     CEFEPIME <=1 SENSITIVE Sensitive     CEFTAZIDIME <=1 SENSITIVE Sensitive     CEFTRIAXONE <=1 SENSITIVE Sensitive     CIPROFLOXACIN <=0.25 SENSITIVE Sensitive     GENTAMICIN <=1 SENSITIVE Sensitive     IMIPENEM <=0.25 SENSITIVE Sensitive     TRIMETH/SULFA <=20 SENSITIVE Sensitive     AMPICILLIN/SULBACTAM 4 SENSITIVE Sensitive     PIP/TAZO <=4 SENSITIVE Sensitive     Extended ESBL NEGATIVE Sensitive     * LIGHT GROWTH ESCHERICHIA COLI   Proteus mirabilis - MIC*    AMPICILLIN <=2 SENSITIVE Sensitive     CEFAZOLIN <=4 SENSITIVE Sensitive     CEFEPIME <=1 SENSITIVE Sensitive     CEFTAZIDIME <=1 SENSITIVE Sensitive     CEFTRIAXONE <=1 SENSITIVE Sensitive     CIPROFLOXACIN <=0.25 SENSITIVE Sensitive     GENTAMICIN <=1 SENSITIVE Sensitive  IMIPENEM 4 SENSITIVE Sensitive     TRIMETH/SULFA <=20 SENSITIVE Sensitive     AMPICILLIN/SULBACTAM <=2 SENSITIVE Sensitive     PIP/TAZO <=4 SENSITIVE Sensitive     * LIGHT GROWTH PROTEUS MIRABILIS   Pseudomonas fluorescens - MIC*    CEFTAZIDIME 4 SENSITIVE Sensitive     CIPROFLOXACIN <=0.25 SENSITIVE Sensitive     GENTAMICIN <=1 SENSITIVE Sensitive     IMIPENEM <=0.25 SENSITIVE Sensitive     PIP/TAZO 8 SENSITIVE Sensitive     * LIGHT GROWTH PSEUDOMONAS FLUORESCENS    Medical History: Past Medical History  Diagnosis Date  . Dementia   . Osteomyelitis (HCC)     sacral wound  . Hypertension   . Arthritis   . Diabetes mellitus without complication [facility])    Assessment: 82 yo female brought to ED with thick  phlegm/mucous which has increased over past week. Pharmacy was consulted for dosing of cefepime for HCAP/aspiration PNA.  ID also recommended starting patient on metronidazole.   Patient was  discharged on 3/25  After hospital admission for sacral decubitus ulcer osteomyelitis.  Patient was discharged with vancomycin and Invanz.   Plan:  Cefepime dosing recommendation for PNA is 1-2g every 8-12 hours. Due to patients CrCl <76ml, will start the patient on cefepime 1g IV every 12 hours. Pharmacy will continue to monitor renal function and adjust dose as needed.     Cher Nakai, PharmD Pharmacy Resident  06/21/2015,7:01 PM

## 2015-06-21 NOTE — ED Provider Notes (Signed)
[facility] Emergency Department Provider Note   ____________________________________________  Time seen: Approximately 3pm I have reviewed the triage vital signs and the triage nursing note.  HISTORY  Chief Complaint Hemoptysis   Historian Limited to baseline altered mental status Per nursing report Oldest son, one of her caregivers  HPI Jasmine Briggs is a 82 y.o. female who has dementia and chronic illness and is cared for by family at home, 6 children. The oldest son is here at the bedside. He reports that over the past several weeks they've noticed that she has had phlegm that sounds like a little bit of gurgling in the back of her throat and it sometimes spits out especially when she is leaning to the side or sleeping. They report no fevers. He reports no coughing or trouble breathing.  He reports that she was seen at [facility] about 4 weeks ago, and then seen here at [facility] emergency department about a week and a half ago for similar complaint and ended up admitted and treated for urinary infection. She has been on chronic at home pick line treatment of IV vancomycin and Invanz for sacral decubitus ulcers.  She does tend to sleep a lot during the day and be up at night, and has some dementia. Son reports that she is not having any change to mental status.  She previously several weeks ago was able to walk with a cane, but for the last several weeks has been essentially bedbound.  Family is handling her care at home and is not interested in moving her to a facility.  They brought her back in for evaluation because they're still concerned about the large thick phlegm/mucus that will occasionally spill out of her mouth. It was blood-tinged today. She is on Eloquis due to history of upper extremity DVT.    Past Medical History  Diagnosis Date  . Dementia   . Osteomyelitis (HCC)     sacral wound  . Hypertension   . Arthritis   . Diabetes mellitus without  complication [facility]     Patient Active Problem List   Diagnosis Date Noted  . Pressure ulcer 06/14/2015  . Altered mental status 06/12/2015    Past Surgical History  Procedure Laterality Date  . Picc line placement    . Cardiac surgery      Current Outpatient Rx  Name  Route  Sig  Dispense  Refill  . acetaminophen (TYLENOL) 500 MG tablet   Oral   Take 500 mg by mouth every 8 (eight) hours as needed for mild pain.         Marland Kitchen allopurinol (ZYLOPRIM) 100 MG tablet   Oral   Take 100 mg by mouth daily.         Marland Kitchen amLODipine (NORVASC) 10 MG tablet   Oral   Take 10 mg by mouth daily. Reported on 06/12/2015         . apixaban (ELIQUIS) 2.5 MG TABS tablet   Oral   Take 2.5 mg by mouth 2 (two) times daily.         . ertapenem (INVANZ) 1 g injection   Intramuscular   Inject 1 g into the muscle every evening. Reported on 06/12/2015   15 each   0   . feeding supplement, ENSURE ENLIVE, (ENSURE ENLIVE) LIQD   Oral   Take 237 mLs by mouth 2 (two) times daily between meals.   237 mL   12   . HEPARIN SODIUM, PORCINE, IV  Intravenous   Inject 2 mLs into the vein every Monday, Wednesday, and Friday. Heparin, porcine 100 units/mL.         Marland Kitchen. ibuprofen (ADVIL,MOTRIN) 600 MG tablet   Oral   Take 600 mg by mouth every 6 (six) hours as needed for mild pain.         . polyethylene glycol (MIRALAX / GLYCOLAX) packet   Oral   Take 17 g by mouth daily as needed for mild constipation. Reported on 06/12/2015         . QUEtiapine (SEROQUEL) 25 MG tablet   Oral   Take 1 tablet (25 mg total) by mouth at bedtime.   30 tablet   0   . vancomycin 500 mg in sodium chloride 0.9 % 100 mL   Intravenous   Inject 500 mg into the vein daily.   15 Dose   0     Allergies Review of patient's allergies indicates no known allergies.  No family history on file.  Social History Social History  Substance Use Topics  . Smoking status: Never Smoker   . Smokeless tobacco: None  .  Alcohol Use: No    Review of Systems Obtained from son, Limited due to patient's dementia. Constitutional: Negative for fever. Eyes:  ENT:no nasal congestion or cough Cardiovascular: Negative for chest pain. Respiratory: Negative for shortness of breath. Gastrointestinal: Negative for vomiting and diarrhea. Genitourinary: Chronic indwelling catheter. Musculoskeletal:  Skin:  Neurological: No altered mental status to baseline 10 point Review of Systems otherwise negative ____________________________________________   PHYSICAL EXAM:  VITAL SIGNS: ED Triage Vitals  Enc Vitals Group     BP 06/21/15 1339 134/70 mmHg     Pulse Rate 06/21/15 1339 72     Resp 06/21/15 1339 20     Temp 06/21/15 1339 97.7 F (36.5 C)     Temp Source 06/21/15 1339 Axillary     SpO2 --      Weight 06/21/15 1339 180 lb 5 oz (81.788 kg)     Height 06/21/15 1339 5\' 7"  (1.702 m)     Head Cir --      Peak Flow --      Pain Score --      Pain Loc --      Pain Edu? --      Excl. in GC? --      Constitutional:  No respiratory distress. HEENT   Head: Normocephalic and atraumatic.      Eyes: Conjunctivae are normal. PERRL. Normal extraocular movements.      Ears:         Nose: No congestion/rhinnorhea.   Mouth/Throat: Mucous membranes are moist.   Neck: No stridor. Cardiovascular/Chest: Normal rate, regular rhythm.  No murmurs, rubs, or gallops. Respiratory: Normal respiratory effort without tachypnea nor retractions. Mildly decreased breath sounds at bases, patient not able to actively take a deep breath Gastrointestinal: Soft. No distention, no guarding, no rebound. Nontender.   Genitourinary/rectal:indwelling foley cather Musculoskeletal: Normal appearance of the extremities Neurologic: No facial droop, non verbal.  No gross or focal neurologic deficits are appreciated. Skin:  Skin is warm, dry and intact. No rash noted.  ____________________________________________   EKG I, Governor Rooksebecca  Lace Chenevert, MD, the attending physician have personally viewed and interpreted all ECGs.  71 bpm. Ventricularly paced. ____________________________________________  LABS (pertinent positives/negatives)  White blood count 20.6, hemoglobin 7.5 and platelet count 64 Conference metabolic panel significant for BUN 27 and cranny 1.11 Troponin 0.04 BNP 362 Urinalysis 2+ leukocytes,  too numerous to count red blood cells and white blood cells, many bacteria  ____________________________________________  RADIOLOGY All Xrays were viewed by me. Imaging interpreted by Radiologist.  Chest two-view:  IMPRESSION: Increased density in the right upper lobe worrisome for progressive pneumonia. Increased retrocardiac density on the left consistent with worsening atelectasis or development of pneumonia as well. Mild stick stable cardiac silhouette enlargement without pulmonary vascular congestion. __________________________________________  PROCEDURES  Procedure(s) performed: None  Critical Care performed: None  ____________________________________________   ED COURSE / ASSESSMENT AND PLAN  Pertinent labs & imaging results that were available during my care of the patient were reviewed by me and considered in my medical decision making (see chart for details).   This patient was brought in for what thick mucus that they can hear her breathing or gurgling at the throat and sometimes she will spit out a thick light yellow or white mucous ball.  There is been no respiratory distress, fever, or coughing. Family does not think that this is sinus congestion per se. The mucus had some blood tinge to it today, but they report no nosebleed, and this is not from the lungs, or emesis. It sounds like she is actually just not swallowing her typical secretions and they are coagulating before being spit out.  She's had a decline over the past several weeks, as well as to previous hospital visits to [facility] emergency  Department and then a few days at [facility] last week.  Today it does not look like she is having a new acute respiratory problems such as PE or pneumonia. It doesn't sound like she is having hematemesis. I question the possibility of potential new stroke affecting her swallowing ability, but son states that she eats food fine and is able to swallow then. I also reviewed that she had a CT scan showing no acute stroke during her evaluation about a week ago when she has been having the same symptoms. I'm not going to repeat this today.   Chest x-ray consistent with developing right upper lobe pneumonia and possibly also left-sided pneumonia. Urinalysis obtained consistent with urinary tract infection. Patient is already on IV vancomycin and Invanz. Blood cultures were added.  Patient will need to be monitored in the hospital given her elevated blood cell count, and additional pneumonia and urinary tract infection.  Infectious disease on-call was less concerned about urinary tract infection, given that the patient is not presenting with specific abdominal type complaints, and this was a catheter sample. The patient is here with respiratory/sputum changes.  Antibiotic change was recommended to continue vancomycin, discontinue Invanz, add cefepime and metronidazole.   CONSULTATIONS:  Infectious disease, Dr. Ralene [place] with [facility] -- by phone for antibiotic recommendations.  Face to face with hospitalist Dr. Cherlynn Kaiser for admission.   Patient / Family / Caregiver informed of clinical course, medical decision-making process, and agree with plan.     ___________________________________________   FINAL CLINICAL IMPRESSION(S) / ED DIAGNOSES   Final diagnoses:  Hemoptysis  Right upper lobe pneumonia  UTI (lower urinary tract infection)              Note: This dictation was prepared with Dragon dictation. Any transcriptional errors that result from this process are  unintentional   Governor Rooks, MD 06/21/15 1818

## 2015-06-21 NOTE — H&P (Signed)
[facility] [place] at [facility]   PATIENT NAME: Jasmine Briggs    MR#:  [id number]  DATE OF BIRTH:  20-Jul-1920  DATE OF ADMISSION:  06/21/2015  PRIMARY CARE PHYSICIAN: Kurtis Bushman, MD   REQUESTING/REFERRING PHYSICIAN: Dr. Governor Rooks  CHIEF COMPLAINT:   Chief Complaint  Patient presents with  . Hemoptysis   shortness of breath, gurgling upper airway sounds.  HISTORY OF PRESENT ILLNESS:  Jasmine Briggs  is a 82 y.o. female with a known history of dementia, sacral decubitus with osteomyelitis, hypertension, diabetes, adult failure to thrive presents to the hospital due to shortness of breath, cough and gurgling upper airway sounds. Herself has advanced dementia and therefore most of the history obtained from the son at bedside. As per the son patient was recently discharged to the hospital on long-term IV antibiotics for treatment of her sacral decubitus with osteomyelitis but over the past 1-2 days he has noticed that she has had some upper airway congestion with mucus and she has been complaining of shortness of breath and therefore was brought to the ER for further evaluation. Patient underwent a chest x-ray upon arrival to the ER and was noted to have a right-sided pneumonia suspicious for aspiration and therefore hospitalist services were contacted further treatment and evaluation.  PAST MEDICAL HISTORY:   Past Medical History  Diagnosis Date  . Dementia   . Osteomyelitis (HCC)     sacral wound  . Hypertension   . Arthritis   . Diabetes mellitus without complication (HCC)     PAST SURGICAL HISTORY:   Past Surgical History  Procedure Laterality Date  . Picc line placement    . Cardiac surgery      SOCIAL HISTORY:   Social History  Substance Use Topics  . Smoking status: Never Smoker   . Smokeless tobacco: Former Neurosurgeon    Types: Snuff  . Alcohol Use: No    FAMILY HISTORY:   Family History  Problem Relation Age of Onset  .  Diabetes Maternal Aunt     DRUG ALLERGIES:  No Known Allergies  REVIEW OF SYSTEMS:   Review of Systems  Unable to perform ROS: dementia    MEDICATIONS AT HOME:   Prior to Admission medications   Medication Sig Start Date End Date Taking? Authorizing Provider  acetaminophen (TYLENOL) 500 MG tablet Take 500 mg by mouth every 8 (eight) hours as needed for mild pain.   Yes Historical Provider, MD  amLODipine (NORVASC) 10 MG tablet Take 10 mg by mouth daily. Reported on 06/12/2015   Yes Historical Provider, MD  ibuprofen (ADVIL,MOTRIN) 600 MG tablet Take 600 mg by mouth every 6 (six) hours as needed for mild pain.   Yes Historical Provider, MD  polyethylene glycol (MIRALAX / GLYCOLAX) packet Take 17 g by mouth daily as needed for mild constipation. Reported on 06/12/2015   Yes Historical Provider, MD  allopurinol (ZYLOPRIM) 100 MG tablet Take 100 mg by mouth daily.    Historical Provider, MD  apixaban (ELIQUIS) 2.5 MG TABS tablet Take 2.5 mg by mouth 2 (two) times daily.    Historical Provider, MD  ertapenem [facility]) 1 g injection Inject 1 g into the muscle every evening. Reported on 06/12/2015 06/15/15   Ramonita Lab, MD  HEPARIN SODIUM, PORCINE, IV Inject 2 mLs into the vein every Monday, Wednesday, and Friday. Heparin, porcine 100 units/mL.    Historical Provider, MD  QUEtiapine (SEROQUEL) 25 MG tablet Take 1 tablet (25 mg total)  by mouth at bedtime. 06/15/15   Ramonita LabAruna Gouru, MD  vancomycin 500 mg in sodium chloride 0.9 % 100 mL Inject 500 mg into the vein daily. 06/15/15   Ramonita LabAruna Gouru, MD      VITAL SIGNS:  Blood pressure 154/90, pulse 70, temperature 97.7 F (36.5 C), temperature source Axillary, resp. rate 20, height 5\' 7"  (1.702 m), weight 81.788 kg (180 lb 5 oz), SpO2 97 %.  PHYSICAL EXAMINATION:  Physical Exam  GENERAL:  82 y.o.-year-old patient lying in the bed encephalopathic/lethargic.  EYES: Pupils equal, round, reactive to light. No scleral icterus. Extraocular muscles intact.   HEENT: Head atraumatic, normocephalic. Dry oral mucosa, poor dentition, mucous in the oral cavity.  NECK:  Supple, no jugular venous distention. No thyroid enlargement, no tenderness.  LUNGS: Diffuse rhonchi bilaterally. Poor respiratory effort. No rales, wheezing. No use of accessory muscles of respiration.  CARDIOVASCULAR: S1, S2 RRR. No murmurs, rubs, gallops, clicks.  ABDOMEN: Soft, nontender, nondistended. Bowel sounds present. No organomegaly or mass.  EXTREMITIES: No pedal edema, cyanosis, or clubbing. + 2 pedal & radial pulses b/l.   NEUROLOGIC: Difficult to do a full neurologic exam but moves all his extremities spontaneously. PSYCHIATRIC: The patient is alert and oriented x 1. Good affect.  SKIN: No obvious rash, lesion, stage 3-4 decubitus ulcer.  LABORATORY PANEL:   CBC  Recent Labs Lab 06/21/15 1515  WBC 20.6*  HGB 7.5*  HCT 23.4*  PLT 264   ------------------------------------------------------------------------------------------------------------------  Chemistries   Recent Labs Lab 06/21/15 1515  NA 141  K 3.5  CL 116*  CO2 22  GLUCOSE 154*  BUN 27*  CREATININE 1.11*  CALCIUM 9.5  AST 19  ALT 19  ALKPHOS 63  BILITOT 0.4   ------------------------------------------------------------------------------------------------------------------  Cardiac Enzymes  Recent Labs Lab 06/21/15 1515  TROPONINI 0.04*   ------------------------------------------------------------------------------------------------------------------  RADIOLOGY:  Dg Chest 2 View  06/21/2015  CLINICAL DATA:  Increased sputum production with hemoptysis, 1 week history of chest congestion, chronically altered mental status. EXAM: CHEST  2 VIEW COMPARISON:  Portable chest x-ray of June 12, 2015 FINDINGS: Today's study is obtained in a somewhat more lordotic fashion than the previous study. The lungs are mildly hypoinflated. The interstitial markings are coarse. There is density in the  right upper lobe that is more conspicuous today. The left retrocardiac density has increased. The cardiac silhouette remains enlarged. The pulmonary vascularity is not clearly engorged. A small right pleural effusion is suspected. The permanent pacemaker is in reasonable position radiographically. The sternal wires are intact. The left internal jugular venous catheter tip projects over the distal third of the SVC. IMPRESSION: Increased density in the right upper lobe worrisome for progressive pneumonia. Increased retrocardiac density on the left consistent with worsening atelectasis or development of pneumonia as well. Mild stick stable cardiac silhouette enlargement without pulmonary vascular congestion. Electronically Signed   By: David  SwazilandJordan M.D.   On: 06/21/2015 16:09     IMPRESSION AND PLAN:   82 year old female with past medical history of dementia, sacral decubitus with osteomyelitis, adult failure to thrive, diabetes, hypertension, osteoarthritis who presents to the hospital due to shortness of breath, cough.  1. Sepsis-patient meets criteria given her tachycardia, leukocytosis and chest x-ray findings suggestive of a aspiration pneumonia. -Continue vancomycin, will change to cefepime, Flagyl and follow blood, sputum cultures. -Follow hemodynamics.  2. Pneumonia-aspiration. Continue cefepime, Flagyl. -Keep nothing by mouth for now. I will get a speech evaluation.  3. Acute respiratory failure with hypoxia-due to pneumonia as  mentioned above. -Continue oxygen support and treatment of underlying pneumonia as mentioned.  4. Leukocytosis-secondary to the sepsis/pneumonia. Follow white cell count with IV antibacterial therapy.  5. Essential hypertension-hold antihypertensives given patient's sepsis.  6. History of gout-no acute attack. Continue allopurinol.  7. History of DVT in the upper extremity-continue Eliquis.  8. History of sacral decubitus with osteoarthritis-continue  vancomycin, cefepime, Flagyl.  Patient is a full code. Discussed CODE STATUS with the patient's son wants her to be a full code presently. Patient's prognosis is poor I will get palliative care consult to discuss goals of care.  All the records are reviewed and case discussed with ED provider. Management plans discussed with the patient, family and they are in agreement.  CODE STATUS: Full  TOTAL TIME TAKING CARE OF THIS PATIENT: 50 minutes.    Houston Siren M.D on 06/21/2015 at 6:56 PM  Between 7am to 6pm - Pager - [phone]  After 6pm go to [email or web address] - password [email or web address] [facility]  [place] [facility]  Office  [phone]  CC: Primary care physician; Kurtis Bushman, MD

## 2015-06-21 NOTE — ED Notes (Signed)
Pt arrived via EMS after being called to home by family for changes in the amount of sputum and its characters. Sputum was large and blood tinged per family. Pt has blood clot in right arm as well as dislocation of shoulder. PICC in Left upper chest. Congestion upper chest over a week per family. Family states Altered mental status per norm, advanced home care sees pt twice per week.

## 2015-06-22 LAB — BASIC METABOLIC PANEL
Anion gap: 7 (ref 5–15)
BUN: 26 mg/dL — AB (ref 6–20)
CHLORIDE: 115 mmol/L — AB (ref 101–111)
CO2: 21 mmol/L — ABNORMAL LOW (ref 22–32)
CREATININE: 1.43 mg/dL — AB (ref 0.44–1.00)
Calcium: 10 mg/dL (ref 8.9–10.3)
GFR, EST AFRICAN AMERICAN: 36 mL/min — AB (ref >60)
GFR, EST NON AFRICAN AMERICAN: 31 mL/min — AB (ref >60)
Glucose, Bld: 111 mg/dL — ABNORMAL HIGH (ref 65–99)
Potassium: 3.5 mmol/L (ref 3.5–5.1)
SODIUM: 143 mmol/L (ref 135–145)

## 2015-06-22 LAB — CBC
HCT: 25.1 % — ABNORMAL LOW (ref 35.0–47.0)
Hemoglobin: 8 g/dL — ABNORMAL LOW (ref 12.0–16.0)
MCH: 27.6 pg (ref 26.0–34.0)
MCHC: 31.7 g/dL — ABNORMAL LOW (ref 32.0–36.0)
MCV: 87.1 fL (ref 80.0–100.0)
PLATELETS: 299 10*3/uL (ref 150–440)
RBC: 2.88 MIL/uL — AB (ref 3.80–5.20)
RDW: 18.9 % — AB (ref 11.5–14.5)
WBC: 18.3 10*3/uL — AB (ref 3.6–11.0)

## 2015-06-22 LAB — VANCOMYCIN, TROUGH: VANCOMYCIN TR: 22 ug/mL — AB (ref 10–20)

## 2015-06-22 LAB — GLUCOSE, CAPILLARY
GLUCOSE-CAPILLARY: 107 mg/dL — AB (ref 65–99)
Glucose-Capillary: 105 mg/dL — ABNORMAL HIGH (ref 65–99)
Glucose-Capillary: 99 mg/dL (ref 65–99)

## 2015-06-22 LAB — MAGNESIUM: MAGNESIUM: 2.2 mg/dL (ref 1.7–2.4)

## 2015-06-22 MED ORDER — DEXTROSE 5 % IV SOLN
1.0000 g | INTRAVENOUS | Status: DC
  Filled 2015-06-22: qty 1

## 2015-06-22 MED ORDER — INSULIN ASPART 100 UNIT/ML ~~LOC~~ SOLN: 0.0000 [IU] | Freq: Every day | SUBCUTANEOUS | Status: DC

## 2015-06-22 MED ORDER — INSULIN ASPART 100 UNIT/ML ~~LOC~~ SOLN
0.0000 [IU] | Freq: Three times a day (TID) | SUBCUTANEOUS | Status: DC
  Administered 2015-06-24: 17:00:00 2 [IU] via SUBCUTANEOUS
  Administered 2015-06-25: 1 [IU] via SUBCUTANEOUS
  Administered 2015-06-25: 2 [IU] via SUBCUTANEOUS
  Filled 2015-06-22 (×2): qty 2
  Filled 2015-06-22: qty 1

## 2015-06-22 MED ORDER — DEXTROSE 5 % IV SOLN
2.0000 g | INTRAVENOUS | Status: DC
  Administered 2015-06-22 – 2015-06-25 (×4): 2 g via INTRAVENOUS
  Filled 2015-06-22 (×5): qty 2

## 2015-06-22 MED ORDER — VANCOMYCIN HCL 500 MG IV SOLR
500.0000 mg | INTRAVENOUS | Status: DC
  Filled 2015-06-22: qty 500

## 2015-06-22 MED ORDER — SODIUM CHLORIDE 0.9 % IV SOLN
INTRAVENOUS | Status: AC
Start: 2015-06-22 — End: 2015-06-23
  Administered 2015-06-22 – 2015-06-23 (×2): via INTRAVENOUS

## 2015-06-22 NOTE — Plan of Care (Signed)
Problem: Skin Integrity: Goal: Risk for impaired skin integrity will decrease Outcome: Not Progressing Patient admitted with stage III pressure ulcer to sacrum.  Wound cleansed and pink foam applied.  Patient with Q2 repositioning throughout shift.

## 2015-06-22 NOTE — Progress Notes (Signed)
[facility] - [place] at [facility]   PATIENT NAME: Jasmine CaraSarah Briggs    MR#:  [id number]  DATE OF BIRTH:  06/22/1920  SUBJECTIVE:  CHIEF COMPLAINT:   Chief Complaint  Patient presents with  . Hemoptysis   The patient is demented. REVIEW OF SYSTEMS:  Unable to get ROS due to dementia.  DRUG ALLERGIES:  No Known Allergies  VITALS:  Blood pressure 145/81, pulse 72, temperature 98.6 F (37 C), temperature source Axillary, resp. rate 18, height 5\' 7"  (1.702 m), weight 79.788 kg (175 lb 14.4 oz), SpO2 97 %.  PHYSICAL EXAMINATION:  GENERAL:  82 y.o.-year-old patient lying in the bed with no acute distress. Obese. EYES: Pupils equal, round, reactive to light and accommodation. No scleral icterus. Extraocular muscles intact.  HEENT: Head atraumatic, normocephalic. Oropharynx and nasopharynx clear.  NECK:  Supple, no jugular venous distention. No thyroid enlargement, no tenderness.  LUNGS: Normal breath sounds bilaterally, no wheezing, but has crackles. No use of accessory muscles of respiration.  CARDIOVASCULAR: S1, S2 normal. No murmurs, rubs, or gallops.  ABDOMEN: Soft, nontender, nondistended. Bowel sounds present. No organomegaly or mass.  EXTREMITIES: No pedal edema, cyanosis, or clubbing.  NEUROLOGIC: The patient is demented, not follow commands. PSYCHIATRIC: The patient is demented.  SKIN: No obvious rash, lesion, or ulcer.    LABORATORY PANEL:   CBC  Recent Labs Lab 06/22/15 0748  WBC 18.3*  HGB 8.0*  HCT 25.1*  PLT 299   ------------------------------------------------------------------------------------------------------------------  Chemistries   Recent Labs Lab 06/21/15 1515 06/22/15 0748  NA 141 143  K 3.5 3.5  CL 116* 115*  CO2 22 21*  GLUCOSE 154* 111*  BUN 27* 26*  CREATININE 1.11* 1.43*  CALCIUM 9.5 10.0  MG  --  2.2  AST 19  --   ALT 19  --   ALKPHOS 63  --   BILITOT 0.4  --     ------------------------------------------------------------------------------------------------------------------  Cardiac Enzymes  Recent Labs Lab 06/21/15 1515  TROPONINI 0.04*   ------------------------------------------------------------------------------------------------------------------  RADIOLOGY:  Dg Chest 2 View  06/21/2015  CLINICAL DATA:  Increased sputum production with hemoptysis, 1 week history of chest congestion, chronically altered mental status. EXAM: CHEST  2 VIEW COMPARISON:  Portable chest x-ray of June 12, 2015 FINDINGS: Today's study is obtained in a somewhat more lordotic fashion than the previous study. The lungs are mildly hypoinflated. The interstitial markings are coarse. There is density in the right upper lobe that is more conspicuous today. The left retrocardiac density has increased. The cardiac silhouette remains enlarged. The pulmonary vascularity is not clearly engorged. A small right pleural effusion is suspected. The permanent pacemaker is in reasonable position radiographically. The sternal wires are intact. The left internal jugular venous catheter tip projects over the distal third of the SVC. IMPRESSION: Increased density in the right upper lobe worrisome for progressive pneumonia. Increased retrocardiac density on the left consistent with worsening atelectasis or development of pneumonia as well. Mild stick stable cardiac silhouette enlargement without pulmonary vascular congestion. Electronically Signed   By: David  SwazilandJordan M.D.   On: 06/21/2015 16:09    EKG:   Orders placed or performed during the hospital encounter of 06/21/15  . ED EKG  . ED EKG    ASSESSMENT AND PLAN:   82 year old female with past medical history of dementia, sacral decubitus with osteomyelitis, adult failure to thrive, diabetes, hypertension, osteoarthritis who presents to the hospital due to shortness of breath, cough.  1. Sepsis-patient meets criteria given  her  tachycardia, leukocytosis and chest x-ray findings suggestive of a aspiration pneumonia. -Continue cefepime, Flagyl and follow blood, sputum cultures.  2. Pneumonia-aspiration. Continue cefepime, Flagyl. -Keep nothing by mouth per speech evaluation.  3. Acute respiratory failure with hypoxia-due to pneumonia as mentioned above. -Continue oxygen support and treatment of underlying pneumonia as mentioned.  4. Leukocytosis-secondary to the sepsis/pneumonia. Follow white cell count with IV antibacterial therapy.  5. Essential hypertension-hold antihypertensives given patient's sepsis and aspiration.  6. History of gout-no acute attack. continue allopurinol.  7. History of DVT in the upper extremity-continue Eliquis.  8. History of sacral decubitus with osteoarthritis-continue vancomycin, cefepime, Flagyl.  * ARF with dehydration. Start IV fluid to support and follow-up BMP. * Diabetes. Start a sliding scale.   Poor prognosis, follow-up up palliative care consult. All the records are reviewed and case discussed with Care Management/Social Workerr. Management plans discussed with the patient, family and they are in agreement.  CODE STATUS: Full code  TOTAL TIME TAKING CARE OF THIS PATIENT:  39 Minutes.  Greater than 50% time was spent on coordination of care and face-to-face counseling.  POSSIBLE D/C IN 3 DAYS, DEPENDING ON CLINICAL CONDITION.   Shaune Pollack M.D on 06/22/2015 at 3:10 PM  Between 7am to 6pm - Pager - [phone]  After 6pm go to [email or web address] - password [email or web address] [facility]  [facility]  Office  [phone]  CC: Primary care physician; Kurtis Bushman, MD

## 2015-06-22 NOTE — Progress Notes (Signed)
Initial Nutrition Assessment  DOCUMENTATION CODES:   Not applicable  INTERVENTION:   -SLP following, per report pt to remain NPO at this time -Will await diet advancement as medically able. RD notes on last admission last week, SLP had recommended Dysphagia I with thin liquids -Will recommend addition of supplement once diet able to be advanced if applicable. RD notes previously pt drinking Ensure and likes ice cream.   NUTRITION DIAGNOSIS:   Increased nutrient needs related to wound healing as evidenced by estimated needs.  GOAL:   Patient will meet greater than or equal to 90% of their needs  MONITOR:   PO intake, Diet advancement, Labs, Weight trends, I & O's, Skin  REASON FOR ASSESSMENT:   Low Braden    ASSESSMENT:   Pt admitted with possible aspiration pna and sepsis. Pt with h/o dementia, bedbound at home with family as caregivers. [facility].  Past Medical History  Diagnosis Date  . Dementia   . Osteomyelitis (HCC)     sacral wound  . Hypertension   . Arthritis   . Diabetes mellitus without complication (HCC)     Diet Order:  Diet NPO time specified Except for: Ice Chips    Current Nutrition: Pt currently NPO.  Food/Nutrition-Related History: Pt family members report that pt has been eating 'well' since last admission up until Thursday (2 days ago). Family reports pt has been eating mashed potatoes, pudding, mashed pinto beans, overcooked collard greens, mashed fruit, and small pieces of angel food cake. RD notes on last admission pt discharged on pureed diet. Family reports they have been mashing all of her food up very well and has been encouraging lots of water and milk as well. Family reported pt drinking Ensure at times.   Scheduled Medications:  . allopurinol  100 mg Oral Daily  . amLODipine  10 mg Oral Daily  . antiseptic oral rinse  7 mL Mouth Rinse q12n4p  . apixaban  2.5 mg Oral BID  . ceFEPime (MAXIPIME) IV  2  g Intravenous Q24H  . chlorhexidine  15 mL Mouth Rinse BID  . insulin aspart  0-5 Units Subcutaneous QHS  . insulin aspart  0-9 Units Subcutaneous TID WC  . metronidazole  500 mg Intravenous Q8H  . QUEtiapine  25 mg Oral QHS  . [START ON 06/23/2015] vancomycin  500 mg Intravenous Q24H    Continuous Medications:  . sodium chloride 50 mL/hr at 06/22/15 1148     Electrolyte/Renal Profile and Glucose Profile:   Recent Labs Lab 06/21/15 1515 06/22/15 0748  NA 141 143  K 3.5 3.5  CL 116* 115*  CO2 22 21*  BUN 27* 26*  CREATININE 1.11* 1.43*  CALCIUM 9.5 10.0  MG  --  2.2  GLUCOSE 154* 111*   Protein Profile:   Recent Labs Lab 06/21/15 1515  ALBUMIN 2.3*    Gastrointestinal Profile: Last BM:  unknown   Nutrition-Focused Physical Exam Findings:  Unable to complete Nutrition-Focused physical exam at this time as pt lethargic/sleeping.    Weight Change: Pt family reports weight stable and/or weight gain recently. Per [facility] from last admission, weight gain.   Skin:   (Stage III Coccyx pressure ulcer)   Height:   Ht Readings from Last 1 Encounters:  06/21/15 5\' 7"  (1.702 m)    Weight:   Wt Readings from Last 1 Encounters:  06/21/15 175 lb 14.4 oz (79.788 kg)     BMI:  Body mass index  is 27.54 kg/(m^2).  Estimated Nutritional Needs:   Kcal:  BEE: 1240kcals, TEE; (IF 1.2-1.4)(AF 1.2) 1785-2050kcals  Protein:  95-120g protein  Fluid:  1993-2324mL of fluid   EDUCATION NEEDS:   No education needs identified at this time   Leda Quail, RD, LDN Pager [phone] Weekend/On-Call Pager [phone]

## 2015-06-22 NOTE — Progress Notes (Signed)
Pharmacy Antibiotic Note  Jasmine Briggs is a 82 y.o. female admitted on 06/21/2015 with pneumonia and UTI. Patient was receiving vancomycin 500mg  q24 hours prior to admission for osteomyelitis from sacral decubitus ulcer.  Pharmacy has been consulted for monitoring and dosing of her vancomycin while in the hospital.  Patients family said patient's dose of vancomycin 500mg   was given everyday around 0800- 0830.   Patient is also receiving cefepime and metronidazole per ID's recommendation- for her wound and  suspected aspiration PNA.   Trough level resulted @ 22 mcg/ml.   Plan: Due to Vancomycin trough level being slightly above goal, will push Vancomycin dose out to 13:00. Will continue Vancomycin 500 mg IV q24 hours starting @ 13:00. Will order serum creatinine with am labs.  Will assess renal function in am to determine when to order trough level.  Height: 5\' 7"  (170.2 cm) Weight: 175 lb 14.4 oz (79.788 kg) IBW/kg (Calculated) : 61.6  Temp (24hrs), Avg:98.1 F (36.7 C), Min:97.7 F (36.5 C), Max:98.6 F (37 C)   Recent Labs Lab 06/21/15 1515 06/22/15 0748  WBC 20.6* 18.3*  CREATININE 1.11* 1.43*  VANCOTROUGH  --  22*    Estimated Creatinine Clearance: 27.3 mL/min (by C-G formula based on Cr of 1.43).    No Known Allergies  Antimicrobials this admission: Vancomycin  >>  Cefepime >>  Metronidazole>>   Microbiology results: 3/31 BCx: pending 3/31 UCx: pending  Thank you for allowing pharmacy to be a part of this patient's care.  Demetrius Charityeldrin D. Amyia Lodwick, PharmD Clinical Pharmacist 06/22/2015 9:36 AM

## 2015-06-22 NOTE — Progress Notes (Signed)
Pharmacy Antibiotic Note  Jeanella CaraSarah Racicot is a 82 y.o. female admitted on 06/21/2015 with pneumonia.  Pharmacy has been consulted for Cefepime dosing.  Plan: The dose of cefepime will be adjusted to 2 gm IV q24h based on renal function.  Height: 5\' 7"  (170.2 cm) Weight: 175 lb 14.4 oz (79.788 kg) IBW/kg (Calculated) : 61.6  Temp (24hrs), Avg:98.1 F (36.7 C), Min:97.7 F (36.5 C), Max:98.6 F (37 C)   Recent Labs Lab 06/21/15 1515  WBC 20.6*  CREATININE 1.11*    Estimated Creatinine Clearance: 35.2 mL/min (by C-G formula based on Cr of 1.11).    No Known Allergies  Antimicrobials this admission: Vancomycin 3/31 >>  Cefepime 3/31 >> Metronidazole 3/31 >>  Dose adjustments this admission: Continue Vancomycin 500 mg IV q24h.  Trough scheduled for today at 0730  Microbiology results: Results for orders placed or performed during the hospital encounter of 06/21/15  Urine culture     Status: None (Preliminary result)   Collection Time: 06/21/15  3:15 PM  Result Value Ref Range Status   Specimen Description URINE, RANDOM  Final   Special Requests NONE  Final   Culture TOO YOUNG TO READ  Final   Report Status PENDING  Incomplete     Thank you for allowing pharmacy to be a part of this patient's care.  Larrisha Babineau G 06/22/2015 8:10 AM

## 2015-06-22 NOTE — Evaluation (Signed)
Clinical/Bedside Swallow Evaluation Patient Details  Name: Jasmine Briggs MRN: [id number] Date of Birth: 1920/08/16  Today's Date: 06/22/2015 Time: SLP Start Time (ACUTE ONLY): 0940 SLP Stop Time (ACUTE ONLY): 1020 SLP Time Calculation (min) (ACUTE ONLY): 40 min  Past Medical History:  Past Medical History  Diagnosis Date  . Dementia   . Osteomyelitis (HCC)     sacral wound  . Hypertension   . Arthritis   . Diabetes mellitus without complication [facility](HCC)    Past Surgical History:  Past Surgical History  Procedure Laterality Date  . Picc line placement    . Cardiac surgery     HPI:  Jasmine Briggs is a 82 y.o. female with a known history of dementia, sacral decubitus with osteomyelitis, hypertension, diabetes, adult failure to thrive presents to the hospital due to shortness of breath, cough and gurgling upper airway sounds. Herself has advanced dementia and therefore most of the history obtained from the son at bedside. As per the son patient was recently discharged to the hospital on long-term IV antibiotics for treatment of her sacral decubitus with osteomyelitis but over the past 1-2 days he has noticed that she has had some upper airway congestion with mucus and she has been complaining of shortness of breath and therefore was brought to the ER for further evaluation. Patient underwent a chest x-ray upon arrival to the ER and was noted to have a right-sided pneumonia suspicious for aspiration and therefore hospitalist services were contacted further treatment and evaluation. Per notes from recent admission pt was evaluated by ST and had been tolerating puree diet with thin liquids upon discharge.    Assessment / Plan / Recommendation Clinical Impression  Pt appears to present w/ oral phase dysphagia and s/s of pharygneal dysphagia impacted by declined Cognitive status and Dementia. Pt judged to be high risk of aspiration d/t declined cognitive status, lethargy and dementia. Pt demonstrated  delayed cough and/or throat clear following trials of ice chips and puree. Despite mod-max verbal/tactile cues during bolus presentation pt did not readily open mouth to receive boluses. Pt also demonstrated prolonged bolus formation and right anterior spillage following trials of ice chips. Pt required max. cues and feeding assistance sec. to Cognitive decline and legal blindness at baseline. MD consulted and agreed w/NPO except ice chips for pleasure and when pt is fully alert. Will re-attempt PO trials at bedside as pt's alertness level improves. No family present to receive education and pt unable.       Aspiration Risk  Severe aspiration risk    Diet Recommendation NPO;Ice chips PRN after oral care   Postural Changes: Seated upright at 90 degrees    Other  Recommendations Recommended Consults: Other (Comment) (Dietitian) Oral Care Recommendations: Oral care prior to ice chip/H20;Oral care BID;Staff/trained caregiver to provide oral care   Follow up Recommendations  Skilled Nursing facility;24 hour supervision/assistance    Frequency and Duration min 2x/week  2 weeks       Prognosis Prognosis for Safe Diet Advancement: Guarded Barriers to Reach Goals: Cognitive deficits      Swallow Study   General Date of Onset: 06/21/15 HPI: Jasmine Briggs is a 82 y.o. female with a known history of dementia, sacral decubitus with osteomyelitis, hypertension, diabetes, adult failure to thrive presents to the hospital due to shortness of breath, cough and gurgling upper airway sounds. Herself has advanced dementia and therefore most of the history obtained from the son at bedside. As per the son patient was recently discharged to  the hospital on long-term IV antibiotics for treatment of her sacral decubitus with osteomyelitis but over the past 1-2 days he has noticed that she has had some upper airway congestion with mucus and she has been complaining of shortness of breath and therefore was brought to  the ER for further evaluation. Patient underwent a chest x-ray upon arrival to the ER and was noted to have a right-sided pneumonia suspicious for aspiration and therefore hospitalist services were contacted further treatment and evaluation. Per notes from recent admission pt was evaluated by ST and had been tolerating puree diet with thin liquids upon discharge.  Type of Study: Bedside Swallow Evaluation Previous Swallow Assessment: Pt was evaluated by ST on 3/23 during recent admission. At that time pt demonstrated oral phase dysphagia and placed on puree diet w/thin liquids.  Diet Prior to this Study: NPO Temperature Spikes Noted: N/A Respiratory Status: Room air History of Recent Intubation: No Behavior/Cognition: Confused;Lethargic/Drowsy;Doesn't follow directions;Requires cueing Oral Cavity Assessment: Other (comment) (Unable to fully assess as pt unwilling to open mouth for exam.) Oral Care Completed by SLP: Other (Comment) (Partially completed d/t pt's willingness to open mouth) Oral Cavity - Dentition: Missing dentition;Poor condition Vision: Impaired for self-feeding Self-Feeding Abilities: Total assist Patient Positioning: Upright in bed Baseline Vocal Quality: Other (comment) (minimally verbal, mumbled speech) Volitional Cough: Cognitively unable to elicit Volitional Swallow: Unable to elicit    Oral/Motor/Sensory Function Overall Oral Motor/Sensory Function: Other (comment) (Unable to fully assess d/t pt unable to follow commands and decline in cognitive status)   Ice Chips Ice chips: Impaired Presentation: Spoon Oral Phase Impairments: Poor awareness of bolus;Reduced labial seal Oral Phase Functional Implications: Right anterior spillage;Prolonged oral transit Pharyngeal Phase Impairments: Throat Clearing - Delayed Other Comments: did not readily open mouth to receive boluses and noted right anterior spillage following swallow.    Thin Liquid Thin Liquid: Not tested    Nectar  Thick Nectar Thick Liquid: Not tested   Honey Thick Honey Thick Liquid: Not tested   Puree Puree: Impaired Presentation: Spoon Oral Phase Impairments: Poor awareness of bolus Oral Phase Functional Implications: Prolonged oral transit Pharyngeal Phase Impairments: Cough - Delayed Other Comments: did not readily open mouth to receive boluses   Solid   GO   Solid: Not tested        [place] 06/22/2015,10:27 AM

## 2015-06-23 LAB — GLUCOSE, CAPILLARY
GLUCOSE-CAPILLARY: 100 mg/dL — AB (ref 65–99)
GLUCOSE-CAPILLARY: 96 mg/dL (ref 65–99)
Glucose-Capillary: 105 mg/dL — ABNORMAL HIGH (ref 65–99)
Glucose-Capillary: 97 mg/dL (ref 65–99)

## 2015-06-23 LAB — BASIC METABOLIC PANEL
Anion gap: 6 (ref 5–15)
BUN: 28 mg/dL — AB (ref 6–20)
CALCIUM: 9.7 mg/dL (ref 8.9–10.3)
CO2: 20 mmol/L — AB (ref 22–32)
CREATININE: 1.21 mg/dL — AB (ref 0.44–1.00)
Chloride: 119 mmol/L — ABNORMAL HIGH (ref 101–111)
GFR calc non Af Amer: 38 mL/min — ABNORMAL LOW (ref >60)
GFR, EST AFRICAN AMERICAN: 44 mL/min — AB (ref >60)
GLUCOSE: 102 mg/dL — AB (ref 65–99)
Potassium: 3.8 mmol/L (ref 3.5–5.1)
Sodium: 145 mmol/L (ref 135–145)

## 2015-06-23 LAB — CBC
HEMATOCRIT: 25.4 % — AB (ref 35.0–47.0)
HEMOGLOBIN: 8.1 g/dL — AB (ref 12.0–16.0)
MCH: 27.5 pg (ref 26.0–34.0)
MCHC: 31.7 g/dL — ABNORMAL LOW (ref 32.0–36.0)
MCV: 86.7 fL (ref 80.0–100.0)
Platelets: 287 10*3/uL (ref 150–440)
RBC: 2.92 MIL/uL — AB (ref 3.80–5.20)
RDW: 18.7 % — ABNORMAL HIGH (ref 11.5–14.5)
WBC: 16.3 10*3/uL — AB (ref 3.6–11.0)

## 2015-06-23 MED ORDER — VANCOMYCIN HCL 500 MG IV SOLR
500.0000 mg | INTRAVENOUS | Status: DC
  Administered 2015-06-23: 08:00:00 500 mg via INTRAVENOUS
  Filled 2015-06-23: qty 500

## 2015-06-23 MED ORDER — SODIUM CHLORIDE 0.9 % IV SOLN
INTRAVENOUS | Status: DC
  Administered 2015-06-23 (×2): via INTRAVENOUS

## 2015-06-23 MED ORDER — SODIUM CHLORIDE 0.9% FLUSH
10.0000 mL | INTRAVENOUS | Status: DC | PRN
  Administered 2015-06-25: 10 mL
  Filled 2015-06-23: qty 40

## 2015-06-23 MED ORDER — RISPERIDONE 1 MG PO TBDP
1.0000 mg | ORAL_TABLET | Freq: Once | ORAL | Status: AC
Start: 2015-06-23 — End: 2015-06-23
  Administered 2015-06-23: 22:00:00 1 mg via ORAL
  Filled 2015-06-23: qty 1

## 2015-06-23 NOTE — Progress Notes (Signed)
Notified MD (Dr. Imogene Burnhen) in person that foley was leaking with no evidence or urinary retention but with minimal urinary output in drainage bag. Per MD reinsert foley. With positional changes foley began to drain well without reinsertion.

## 2015-06-23 NOTE — Progress Notes (Signed)
[facility] - [place] at [facility]   PATIENT NAME: Jasmine Briggs    MR#:  [id number]  DATE OF BIRTH:  [birth year]  SUBJECTIVE:  CHIEF COMPLAINT:   Chief Complaint  Patient presents with  . Hemoptysis   The patient is demented. REVIEW OF SYSTEMS:  Unable to get ROS due to dementia.  DRUG ALLERGIES:  No Known Allergies  VITALS:  Blood pressure 121/87, pulse 69, temperature 98.1 F (36.7 C), temperature source Oral, resp. rate 19, height  (1.702 m), weight 79.788 kg (175 lb 14.4 oz), SpO2 94 %.  PHYSICAL EXAMINATION:  GENERAL:  82 y.o.-year-old patient lying in the bed with no acute distress. Obese. EYES: Pupils equal, round, reactive to light and accommodation. No scleral icterus. Extraocular muscles intact.  HEENT: Head atraumatic, normocephalic. Oropharynx and nasopharynx clear.  NECK:  Supple, no jugular venous distention. No thyroid enlargement, no tenderness.  LUNGS: Normal breath sounds bilaterally, no wheezing, but has crackles. No use of accessory muscles of respiration.  CARDIOVASCULAR: S1, S2 normal. No murmurs, rubs, or gallops.  ABDOMEN: Soft, nontender, nondistended. Bowel sounds present. No organomegaly or mass.  EXTREMITIES: No pedal edema, cyanosis, or clubbing.  NEUROLOGIC: The patient is demented, not follow commands. PSYCHIATRIC: The patient is demented.  SKIN: No obvious rash, lesion, or ulcer.    LABORATORY PANEL:   CBC  Recent Labs Lab 06/23/15 0514  WBC 16.3*  HGB 8.1*  HCT 25.4*  PLT 287   ------------------------------------------------------------------------------------------------------------------  Chemistries   Recent Labs Lab 06/21/15 1515 06/22/15 0748 06/23/15 0514  NA 141 143 145  K 3.5 3.5 3.8  CL 116* 115* 119*  CO2 22 21* 20*  GLUCOSE 154* 111* 102*  BUN 27* 26* 28*  CREATININE 1.11* 1.43* 1.21*  CALCIUM 9.5 10.0 9.7  MG  --  2.2  --   AST 19  --   --   ALT 19  --   --   ALKPHOS 63  --    --   BILITOT 0.4  --   --    ------------------------------------------------------------------------------------------------------------------  Cardiac Enzymes  Recent Labs Lab 06/21/15 1515  TROPONINI 0.04*   ------------------------------------------------------------------------------------------------------------------  RADIOLOGY:  Dg Chest 2 View  06/21/2015  CLINICAL DATA:  Increased sputum production with hemoptysis, 1 week history of chest congestion, chronically altered mental status. EXAM: CHEST  2 VIEW COMPARISON:  Portable chest x-ray of June 12, 2015 FINDINGS: Today's study is obtained in a somewhat more lordotic fashion than the previous study. The lungs are mildly hypoinflated. The interstitial markings are coarse. There is density in the right upper lobe that is more conspicuous today. The left retrocardiac density has increased. The cardiac silhouette remains enlarged. The pulmonary vascularity is not clearly engorged. A small right pleural effusion is suspected. The permanent pacemaker is in reasonable position radiographically. The sternal wires are intact. The left internal jugular venous catheter tip projects over the distal third of the SVC. IMPRESSION: Increased density in the right upper lobe worrisome for progressive pneumonia. Increased retrocardiac density on the left consistent with worsening atelectasis or development of pneumonia as well. Mild stick stable cardiac silhouette enlargement without pulmonary vascular congestion. Electronically Signed   By: David  [place] M.D.   On: 06/21/2015 16:09    EKG:   Orders placed or performed during the hospital encounter of 06/21/15  . ED EKG  . ED EKG    ASSESSMENT AND PLAN:   82 year old female with past medical history of dementia, sacral decubitus with  osteomyelitis, adult failure to thrive, diabetes, hypertension, osteoarthritis who presents to the hospital due to shortness of breath, cough.  1. Sepsis-patient  meets criteria given her tachycardia, leukocytosis and chest x-ray findings suggestive of a aspiration pneumonia. -Continue cefepime, Flagyl and follow CBC, blood, sputum cultures. Discontinue vancomycin.  2. Pneumonia-aspiration. Continue cefepime, Flagyl. -Keep nothing by mouth and reevaluated tomorrow per speech evaluation.  3. Acute respiratory failure with hypoxia-due to pneumonia as mentioned above.  off oxygen, continue nebulizer when necessary.  4. Leukocytosis-secondary to the sepsis/pneumonia. Follow white cell count with IV antibacterial therapy.  5. Essential hypertension-hold antihypertensives given patient's sepsis and aspiration.  6. History of gout-no acute attack. continue allopurinol.  7. History of DVT in the upper extremity-continue Eliquis.  8. History of sacral decubitus with osteoarthritis.  * ARF with dehydration. continue IV fluid support and follow-up BMP. * Diabetes. on sliding scale.   Poor prognosis, follow-up up palliative care consult. All the records are reviewed and case discussed with Care Management/Social Workerr. Management plans discussed with the patient, family and they are in agreement.  CODE STATUS: Full code  TOTAL TIME TAKING CARE OF THIS PATIENT:  36 Minutes.  Greater than 50% time was spent on coordination of care and face-to-face counseling.  POSSIBLE D/C IN 2-3 DAYS, DEPENDING ON CLINICAL CONDITION.   Shaune Pollackhen, Anorah Trias M.D on 06/23/2015 at 11:10 AM  Between 7am to 6pm - Pager - [phone]  After 6pm go to [email or web address] - password [email or web address]  [facility]  Office  [phone]  CC: Primary care physician; Kurtis BushmanGLADMAN, CHRISTINE D, MD

## 2015-06-24 DIAGNOSIS — N390 Urinary tract infection, site not specified: Secondary | ICD-10-CM

## 2015-06-24 DIAGNOSIS — I129 Hypertensive chronic kidney disease with stage 1 through stage 4 chronic kidney disease, or unspecified chronic kidney disease: Secondary | ICD-10-CM

## 2015-06-24 DIAGNOSIS — D631 Anemia in chronic kidney disease: Secondary | ICD-10-CM

## 2015-06-24 DIAGNOSIS — E43 Unspecified severe protein-calorie malnutrition: Secondary | ICD-10-CM

## 2015-06-24 DIAGNOSIS — Z66 Do not resuscitate: Secondary | ICD-10-CM

## 2015-06-24 DIAGNOSIS — N179 Acute kidney failure, unspecified: Secondary | ICD-10-CM

## 2015-06-24 DIAGNOSIS — R319 Hematuria, unspecified: Secondary | ICD-10-CM

## 2015-06-24 DIAGNOSIS — L89159 Pressure ulcer of sacral region, unspecified stage: Secondary | ICD-10-CM

## 2015-06-24 DIAGNOSIS — M869 Osteomyelitis, unspecified: Secondary | ICD-10-CM

## 2015-06-24 DIAGNOSIS — J9601 Acute respiratory failure with hypoxia: Secondary | ICD-10-CM

## 2015-06-24 DIAGNOSIS — F0390 Unspecified dementia without behavioral disturbance: Secondary | ICD-10-CM

## 2015-06-24 DIAGNOSIS — A419 Sepsis, unspecified organism: Principal | ICD-10-CM

## 2015-06-24 DIAGNOSIS — E119 Type 2 diabetes mellitus without complications: Secondary | ICD-10-CM

## 2015-06-24 DIAGNOSIS — Z515 Encounter for palliative care: Secondary | ICD-10-CM

## 2015-06-24 DIAGNOSIS — J690 Pneumonitis due to inhalation of food and vomit: Secondary | ICD-10-CM

## 2015-06-24 DIAGNOSIS — D72829 Elevated white blood cell count, unspecified: Secondary | ICD-10-CM

## 2015-06-24 LAB — URINE CULTURE: Culture: >=100000

## 2015-06-24 LAB — CBC
HEMATOCRIT: 25.8 % — AB (ref 35.0–47.0)
HEMOGLOBIN: 8.3 g/dL — AB (ref 12.0–16.0)
MCH: 28.4 pg (ref 26.0–34.0)
MCHC: 32.4 g/dL (ref 32.0–36.0)
MCV: 87.8 fL (ref 80.0–100.0)
Platelets: 261 10*3/uL (ref 150–440)
RBC: 2.94 MIL/uL — ABNORMAL LOW (ref 3.80–5.20)
RDW: 18.8 % — ABNORMAL HIGH (ref 11.5–14.5)
WBC: 12.7 10*3/uL — AB (ref 3.6–11.0)

## 2015-06-24 LAB — BASIC METABOLIC PANEL
ANION GAP: 7 (ref 5–15)
BUN: 29 mg/dL — ABNORMAL HIGH (ref 6–20)
CALCIUM: 9.2 mg/dL (ref 8.9–10.3)
CO2: 18 mmol/L — ABNORMAL LOW (ref 22–32)
Chloride: 119 mmol/L — ABNORMAL HIGH (ref 101–111)
Creatinine, Ser: 1.16 mg/dL — ABNORMAL HIGH (ref 0.44–1.00)
GFR, EST AFRICAN AMERICAN: 46 mL/min — AB (ref >60)
GFR, EST NON AFRICAN AMERICAN: 40 mL/min — AB (ref >60)
Glucose, Bld: 104 mg/dL — ABNORMAL HIGH (ref 65–99)
POTASSIUM: 3.6 mmol/L (ref 3.5–5.1)
SODIUM: 144 mmol/L (ref 135–145)

## 2015-06-24 LAB — GLUCOSE, CAPILLARY
GLUCOSE-CAPILLARY: 99 mg/dL (ref 65–99)
GLUCOSE-CAPILLARY: 101 mg/dL — AB (ref 65–99)
Glucose-Capillary: 151 mg/dL — ABNORMAL HIGH (ref 65–99)
Glucose-Capillary: 169 mg/dL — ABNORMAL HIGH (ref 65–99)

## 2015-06-24 MED ORDER — VANCOMYCIN HCL 500 MG IV SOLR
500.0000 mg | INTRAVENOUS | Status: DC
  Administered 2015-06-24 – 2015-06-26 (×3): 500 mg via INTRAVENOUS
  Filled 2015-06-24 (×3): qty 500

## 2015-06-24 MED ORDER — ENSURE ENLIVE PO LIQD
237.0000 mL | Freq: Two times a day (BID) | ORAL | Status: DC
  Administered 2015-06-25 – 2015-06-26 (×3): 237 mL via ORAL

## 2015-06-24 MED ORDER — MORPHINE SULFATE (CONCENTRATE) 10 MG/0.5ML PO SOLN: 5.0000 mg | Freq: Four times a day (QID) | ORAL | Status: DC | PRN

## 2015-06-24 NOTE — Progress Notes (Signed)
Speech Language Pathology Treatment: Dysphagia  Patient Details Name: Jeanella CaraSarah Boldon MRN: [id number] DOB: 1920/05/30 Today's Date: 06/24/2015 Time: [id number] SLP Time Calculation (min) (ACUTE ONLY): 60 min  Assessment / Plan / Recommendation Clinical Impression  Pt appears to present w/ oral phase dysphagia/decreased oral awareness w/ bolus acceptance and management impacted by declined Cognitive status and Dementia; this can increase risk for aspiration in pts. Pt appeared to tolerate trials of thin liquids via straw and trials of puree w/ mod-max. verbal/tactile cues during bolus presentation. No coughing or other overt s/s of aspiration noted during swallowing or clearing of bolus trials; no decline in respiratory status noted w/ trials. Pt required max. cues and feeding assistance sec. to Cognitive decline and legal blindness at baseline. Due to pt's current presentation and baseline Cognitive status; rec. Reinitiating a Dys. 1 diet w/ thin liquids w/ strict aspiration precautions and feeding assistance to provide support during oral intake; Dietician f/u for supplements as indicated. Education on aspiration precautions and food consistency and prep; options.Meds in Puree w/ NSG. Feeding assistance. Education given to WhiteconeGrandson present briefly. NSG updated.   HPI HPI: Jeanella CaraSarah Brabson is a 82 y.o. female with a known history of dementia, sacral decubitus with osteomyelitis, hypertension, diabetes, adult failure to thrive presents to the hospital due to shortness of breath, cough and gurgling upper airway sounds. Herself has advanced dementia and therefore most of the history obtained from the son at bedside. As per the son patient was recently discharged to the hospital on long-term IV antibiotics for treatment of her sacral decubitus with osteomyelitis but over the past 1-2 days he has noticed that she has had some upper airway congestion with mucus and she has been complaining of shortness of breath and  therefore was brought to the ER for further evaluation. Patient underwent a chest x-ray upon arrival to the ER and was noted to have a right-sided pneumonia suspicious for aspiration and therefore hospitalist services were contacted further treatment and evaluation. Per notes from recent admission pt was evaluated by ST and had been tolerating puree diet with thin liquids upon discharge. Currently, pt is NPO sec. to lethagy at BSE and increased risk for aspiration to occur. Grandson present some of the session. Pt is more awake this morning often mumbling and talking aloud w/ poor awareness and ability to answer basic questions.      SLP Plan  Continue with current plan of care;Goals updated     Recommendations  Diet recommendations: Dysphagia 1 (puree);Thin liquid Liquids provided via: Cup;Straw Medication Administration: Crushed with puree Supervision: Full supervision/cueing for compensatory strategies;Trained caregiver to feed patient Compensations: Minimize environmental distractions;Slow rate;Small sips/bites;Follow solids with liquid;Multiple dry swallows after each bite/sip Postural Changes and/or Swallow Maneuvers: Seated upright 90 degrees;Upright 30-60 min after meal             General recommendations:  (Dietician f/u) Oral Care Recommendations: Oral care BID;Staff/trained caregiver to provide oral care Follow up Recommendations: Skilled Nursing facility (TBD) Plan: Continue with current plan of care;Goals updated     GO               Jerilynn SomKatherine Watson, MS, CCC-SLP  Watson,Katherine 06/24/2015, 3:56 PM

## 2015-06-24 NOTE — Care Management (Signed)
Admitted to [facility] with the diagnosis aspiration pneumonia. Son is Marcy SalvoRaymond [phone]).  [facility] with the prescription of IV Vancomycin x 6 weeks. [facility]. Followed by [facility] in the home. Hospital bed and hoyer lift in the home.  Poor prognosis. Palliative Care consult in progress. Gwenette GreetBrenda S Jazyah Butsch RN MSN CCM Care Management [phone]

## 2015-06-24 NOTE — Consult Note (Signed)
Palliative Medicine Inpatient Consult Note   Name: Jasmine Briggs Date: 06/24/2015 MRN: [id number]  DOB: 1921/02/23  Referring Physician: Max Sane, MD  Palliative Care consult requested for this 82 y.o. female for goals of medical therapy in patient with aspiration pneumonia.    TODAY'S DISCUSSIONS AND DECISIONS:  Pt has agonal respirations. Likely due to recurrent aspiration.  I have met urgently with son, [name], daughter, Angeline Slim, and also spoke on the speaker phone with son, Herbie Baltimore. Daughter, [name], stayed in the room with pt and said she 'would go along with what the others decided'. Apparently there are two other adult children. But, we have a majority decision here and after quickly explaining things, they agree with DNR status. There were others present in the room who are not decision makers and I had to make it clear that I was looking to the adult children to make the decision.   DNR order is entered.  I went back and talked to the family a bit further. We talked in spiritual terms about how the vessel is trying to 'fall off' of the spirit. Family affirmed that it was OK to talk about the spirit. We talked about how it is natural for some of the invisible parts of the body to quit working --swallowing correctly, appetite, and also the immune system.  I mentioned that the antibiotics only do a half-day's work and the immune system is supposed to pick up the 'rest of the shift' --and she has very, very little working immune system due to her age.  Family was told that I just want them to have reasonable expectations about what we can fix and what we cannot fix. [place] care and Comfort care just very briefly --and said we could talk more about making her comfortable in her last days --but I did not want to get into that too much tonight as we need to recheck her and see how she is doing through the night.  Several nodded their heads that they would like her to be comfortable.  However, there are MANY family members ---all on their phones calling others and there could be many opinions we might hear about tomorrow.  The decision makers are those adult children who are reasonably available. I spoke with 4 of the 6.  Ramond tells me that his brother, [name] had told him that he would go along with whatever he thought.  That just leaves one adult child that is not in the loop so far as to the DNR / DNI decision.    Will follow along with you.   IMPRESSION: Sepsis --due to aspiration pneumonia Pneumonis --aspiration Acute aspiration this evening --on food or liquid or her own saliva ---SLP Rxd Dys 1 with thins Acute resp flre with hypoxia due to aspiration pneumonia --was off O2 but now back on  Leukocytosis --due to penumonia Esstl HTN H/O Gout H/O DVT upper arm on Eliquis H/O sacral wound with OSTEOMYELITIS --unhealable due to severe malnutrition and apparent CKD (unclear stage?) --was to have 6 wks of IV Vanco --hospitalized here twice since DC from [facility] ARF due to dehydration --resolving ---Cr 1.43 to 1.16 now Anemia of chronic Disease Severe malnutrition --Albumin of 2.3 on 3/31 when admitted dehydrated Hematuria (this admission and last) Yeast UTI this admission --resulted today       REVIEW OF SYSTEMS:  Patient is not able to provide ROS due to critical illness and dementia.   SPIRITUAL SUPPORT SYSTEM: Yes.  SOCIAL HISTORY:  reports that she has never smoked. She has quit using smokeless tobacco. Her smokeless tobacco use included Snuff. She reports that she does not drink alcohol.  She has SIX adult children who share in decision making.  LINDELL may be the oldest daughter (she is quite frail herself and walks very slowly with a cane). The ONLY name in demographics is that of Graham at [phone].  He and Angeline Slim met with me in person.  I then had ROBERT on the speakerphone that I spoke with also.  MARIE stayed in the room and said  she would go along with whatever the others decided. There were others present today also.  Pt was previously cared for in the home by family.   LEGAL DOCUMENTS:  I placed a portable DNR form in the paper chart .  CODE STATUS: DNR as of now. She had been full code since admission.  PAST MEDICAL HISTORY: Past Medical History  Diagnosis Date  . Dementia   . Osteomyelitis ([place])     sacral wound  . Hypertension   . Arthritis   . Diabetes mellitus without complication ([place])     PAST SURGICAL HISTORY:  Past Surgical History  Procedure Laterality Date  . Picc line placement    . Cardiac surgery      ALLERGIES:  has No Known Allergies.  MEDICATIONS:  Current Facility-Administered Medications  Medication Dose Route Frequency Provider Last Rate Last Dose  . acetaminophen (TYLENOL) tablet 650 mg  650 mg Oral Q6H PRN Henreitta Leber, MD       Or  . acetaminophen (TYLENOL) suppository 650 mg  650 mg Rectal Q6H PRN Henreitta Leber, MD      . allopurinol (ZYLOPRIM) tablet 100 mg  100 mg Oral Daily Henreitta Leber, MD   100 mg at 06/24/15 1101  . amLODipine (NORVASC) tablet 10 mg  10 mg Oral Daily Henreitta Leber, MD   10 mg at 06/24/15 1101  . antiseptic oral rinse (CPC / CETYLPYRIDINIUM CHLORIDE 0.05%) solution 7 mL  7 mL Mouth Rinse q12n4p Henreitta Leber, MD   7 mL at 06/23/15 1600  . apixaban (ELIQUIS) tablet 2.5 mg  2.5 mg Oral BID Henreitta Leber, MD   2.5 mg at 06/24/15 1101  . ceFEPIme (MAXIPIME) 2 g in dextrose 5 % 50 mL IVPB  2 g Intravenous Q24H Loleta Dicker, RPH   2 g at 06/23/15 2032  . chlorhexidine (PERIDEX) 0.12 % solution 15 mL  15 mL Mouth Rinse BID Henreitta Leber, MD   15 mL at 06/24/15 1110  . feeding supplement (ENSURE ENLIVE) (ENSURE ENLIVE) liquid 237 mL  237 mL Oral BID WC Vipul Shah, MD   237 mL at 06/24/15 1700  . insulin aspart (novoLOG) injection 0-5 Units  0-5 Units Subcutaneous QHS Demetrios Loll, MD   0 Units at 06/22/15 2137  . insulin aspart (novoLOG)  injection 0-9 Units  0-9 Units Subcutaneous TID WC Demetrios Loll, MD   2 Units at 06/24/15 1645  . metroNIDAZOLE (FLAGYL) IVPB 500 mg  500 mg Intravenous Q8H Henreitta Leber, MD   500 mg at 06/24/15 1241  . ondansetron (ZOFRAN) tablet 4 mg  4 mg Oral Q6H PRN Henreitta Leber, MD       Or  . ondansetron (ZOFRAN) injection 4 mg  4 mg Intravenous Q6H PRN Henreitta Leber, MD      . QUEtiapine (SEROQUEL) tablet 25 mg  25 mg Oral QHS Henreitta Leber, MD   25 mg at 06/21/15 2200  . sodium chloride flush (NS) 0.9 % injection 10-40 mL  10-40 mL Intracatheter PRN Demetrios Loll, MD      . vancomycin (VANCOCIN) 500 mg in sodium chloride 0.9 % 100 mL IVPB  500 mg Intravenous Q24H Max Sane, MD   500 mg at 06/24/15 1112    Vital Signs: BP 148/90 mmHg  Pulse 74  Temp(Src) 97.5 F (36.4 C) (Oral)  Resp 18  Ht _0  (1.702 m)  Wt 79.788 kg (175 lb 14.4 oz)  BMI 27.54 kg/m2  SpO2 93% Filed Weights   06/21/15 1339 06/21/15 2009  Weight: 81.788 kg (180 lb 5 oz) 79.788 kg (175 lb 14.4 oz)    Estimated body mass index is 27.54 kg/(m^2) as calculated from the following:   Height as of this encounter: _1  (1.702 m).   Weight as of this encounter: 79.788 kg (175 lb 14.4 oz).  PERFORMANCE STATUS (ECOG) : 4 - Bedbound  PHYSICAL EXAM: Pt has agonal, short, quick, guppy-like breathing pattern going on as I enter the room. Her gaze is noted to be going upward so that pupils are above upper lid. She does not speak or respond to commands or voice Sat is 93% on room air but she is using some access muscles No JVD or TM Hrt rrr distand Lungs with decreased BS bases Abd soft and NT Ext no cyanosis or mottling.  Sacral wound not seen this visit but is known to be present.  LABS: CBC:    Component Value Date/Time   WBC 12.7* 06/24/2015 0455   WBC 6.9 10/31/2013 1709   HGB 8.3* 06/24/2015 0455   HGB 12.5 10/31/2013 1709   HCT 25.8* 06/24/2015 0455   HCT 38.9 10/31/2013 1709   PLT 261 06/24/2015 0455   PLT  237 10/31/2013 1709   MCV 87.8 06/24/2015 0455   MCV 94 10/31/2013 1709   NEUTROABS 19.0* 06/21/2015 1515   LYMPHSABS 0.4* 06/21/2015 1515   MONOABS 1.2* 06/21/2015 1515   EOSABS 0.0 06/21/2015 1515   BASOSABS 0.0 06/21/2015 1515   Comprehensive Metabolic Panel:    Component Value Date/Time   NA 144 06/24/2015 0455   NA 138 10/31/2013 1819   K 3.6 06/24/2015 0455   K 4.0 10/31/2013 1819   CL 119* 06/24/2015 0455   CL 109* 10/31/2013 1819   CO2 18* 06/24/2015 0455   CO2 21 10/31/2013 1819   BUN 29* 06/24/2015 0455   BUN 24* 10/31/2013 1819   CREATININE 1.16* 06/24/2015 0455   CREATININE 1.25 10/31/2013 1819   GLUCOSE 104* 06/24/2015 0455   GLUCOSE 152* 10/31/2013 1819   CALCIUM 9.2 06/24/2015 0455   CALCIUM 9.6 10/31/2013 1819   AST 19 06/21/2015 1515   AST 20 10/31/2013 1819   ALT 19 06/21/2015 1515   ALT 30 10/31/2013 1819   ALKPHOS 63 06/21/2015 1515   ALKPHOS 54 10/31/2013 1819   BILITOT 0.4 06/21/2015 1515   BILITOT 0.4 10/31/2013 1819   PROT 5.7* 06/21/2015 1515   PROT 6.9 10/31/2013 1819   ALBUMIN 2.3* 06/21/2015 1515   ALBUMIN 3.0* 10/31/2013 1819    More than 50% of the visit was spent in counseling/coordination of care: Yes  Time Spent: 80 minutes

## 2015-06-24 NOTE — Plan of Care (Signed)
Problem: Physical Regulation: Goal: Ability to maintain clinical measurements within normal limits will improve Outcome: Not Progressing Pt has had minimal urine output with an  Increase in generalized edema. Dr Sherryll BurgerShah aware of minimal urine output.

## 2015-06-24 NOTE — Progress Notes (Signed)
Palliative Care Update  Pt has agonal respirations. Likely due to recurrent aspiration.  I have met urgently with son, [name], daughter, Angeline Slim, and also spoke on the speaker phone with son, Herbie Baltimore.  Daughter, [name], stayed in the room with pt and said she 'would go along with what the others decided'.  Apparently there are two other adult children.  But, we have a majority decision here and after quickly explaining things, they agree with DNR status. There were others present in the room who are not decision makers and I had to make it clear that I was looking to the adult children to make the decision.   Now, I am going back to talk to them further.    DNR order is entered.   Kirby Funk, MD

## 2015-06-24 NOTE — Progress Notes (Signed)
[facility] [place] at [facility]   PATIENT NAME: Jasmine Briggs    MR#:  [id number]  DATE OF BIRTH:  1920-12-26  SUBJECTIVE:  CHIEF COMPLAINT:   Chief Complaint  Patient presents with  . Hemoptysis  remains same.  REVIEW OF SYSTEMS:  Unable to get ROS due to dementia.  DRUG ALLERGIES:  No Known Allergies  VITALS:  Blood pressure 129/77, pulse 69, temperature 97.5 F (36.4 C), temperature source Oral, resp. rate 16, height 5\' 7"  (1.702 m), weight 79.788 kg (175 lb 14.4 oz), SpO2 100 %. PHYSICAL EXAMINATION:  GENERAL:  82 y.o.-year-old patient lying in the bed with no acute distress. Obese. EYES: Pupils equal, round, reactive to light and accommodation. No scleral icterus. Extraocular muscles intact.  HEENT: Head atraumatic, normocephalic. Oropharynx and nasopharynx clear.  NECK:  Supple, no jugular venous distention. No thyroid enlargement, no tenderness.  LUNGS: Normal breath sounds bilaterally, no wheezing, but has crackles. No use of accessory muscles of respiration.  CARDIOVASCULAR: S1, S2 normal. No murmurs, rubs, or gallops.  ABDOMEN: Soft, nontender, nondistended. Bowel sounds present. No organomegaly or mass.  EXTREMITIES: No pedal edema, cyanosis, or clubbing.  NEUROLOGIC: The patient is demented, not follow commands. PSYCHIATRIC: The patient is demented.  SKIN: has chronic sacral decub ulcer.   LABORATORY PANEL:   CBC  Recent Labs Lab 06/24/15 0455  WBC 12.7*  HGB 8.3*  HCT 25.8*  PLT 261   ------------------------------------------------------------------------------------------------------------------  Chemistries   Recent Labs Lab 06/21/15 1515 06/22/15 0748  06/24/15 0455  NA 141 143  < > 144  K 3.5 3.5  < > 3.6  CL 116* 115*  < > 119*  CO2 22 21*  < > 18*  GLUCOSE 154* 111*  < > 104*  BUN 27* 26*  < > 29*  CREATININE 1.11* 1.43*  < > 1.16*  CALCIUM 9.5 10.0  < > 9.2  MG  --  2.2  --   --   AST 19  --   --   --    ALT 19  --   --   --   ALKPHOS 63  --   --   --   BILITOT 0.4  --   --   --   < > = values in this interval not displayed. ------------------------------------------------------------------------------------------------------------------  Cardiac Enzymes  Recent Labs Lab 06/21/15 1515  TROPONINI 0.04*   ------------------------------------------------------------------------------------------------------------------  RADIOLOGY:  No results found.  EKG:   Orders placed or performed during the hospital encounter of 06/21/15  . ED EKG  . ED EKG    ASSESSMENT AND PLAN:   82 year old female with past medical history of dementia, sacral decubitus with osteomyelitis, adult failure to thrive, diabetes, hypertension, osteoarthritis who presents to the hospital due to shortness of breath, cough.  1. Sepsis- Present on admission and chest x-ray findings suggestive of a aspiration pneumonia. -Continue vanco, cefepime, Flagyl and follow CBC, blood, sputum cultures.  vancomycin.  2. Pneumonia-aspiration. Continue vanco, cefepime, Flagyl. -Keep nothing by mouth and reevaluated per speech evaluation today  3. Acute respiratory failure with hypoxia-due to pneumonia as mentioned above.  off oxygen, continue nebulizer when necessary.  4. Leukocytosis-secondary to the sepsis/pneumonia. Follow white cell count with IV antibacterial therapy.  5. Essential hypertension-hold antihypertensives given patient's sepsis and aspiration.  6. History of gout-no acute attack. continue allopurinol.  7. History of DVT in the upper extremity-continue Eliquis.  8. History of sacral decubitus with osteomyelitis: 6 weeks of IV vanco -  I doubt this will heal.  * ARF with dehydration. continue IV fluid support and follow-up BMP.  * Diabetes. on sliding scale.  Just spoke with the son. Patient was at [facility] for her recent sacral osteomyelitis. Discharged on 06/03/2015 and is supposed to be on 6  weeks of IV vancomycin and Invanz since then. She was also diagnosed with an right upper extremity clot during that hospitalization and is discharged on eliquis. Apparently at baseline, patient is alert and conversational with the family per son (1 month ago). Although hard to believe.   Poor prognosis, follow-up up palliative care consult. I have requested son to reconsider code status and possibly Hospice if she qualifies  All the records are reviewed and case discussed with Care Management/Social Worker. Management plans discussed with the patient, family and they are in agreement. (d/w son Jasmine Briggs @ [phone])  CODE STATUS: Full code  TOTAL TIME TAKING CARE OF THIS PATIENT:  35 Minutes.   Greater than 50% time was spent on coordination of care and D/W SON ON PHONE  POSSIBLE D/C IN 1-2 DAYS, DEPENDING ON CLINICAL CONDITION and Palliative care eval   [facility], Azhar Knope M.D on 06/24/2015 at 8:07 AM  Between 7am to 6pm - Pager - [phone]  After 6pm go to [email or web address] - password [email or web address]  [facility]  Office  [phone]  CC: Primary care physician; Jasmine Bushman, MD

## 2015-06-24 NOTE — Care Management Important Message (Signed)
Important Message  Patient Details  Name: Jasmine Briggs MRN: [id number] Date of Birth: 1920-10-13   Medicare Important Message Given:  Yes    Olegario MessierKathy A Cortne Amara 06/24/2015, 10:49 AM

## 2015-06-25 LAB — CBC
HCT: 22.8 % — ABNORMAL LOW (ref 35.0–47.0)
HEMOGLOBIN: 7.2 g/dL — AB (ref 12.0–16.0)
MCH: 27.2 pg (ref 26.0–34.0)
MCHC: 31.6 g/dL — AB (ref 32.0–36.0)
MCV: 85.9 fL (ref 80.0–100.0)
Platelets: 241 10*3/uL (ref 150–440)
RBC: 2.66 MIL/uL — ABNORMAL LOW (ref 3.80–5.20)
RDW: 18.5 % — AB (ref 11.5–14.5)
WBC: 14.8 10*3/uL — ABNORMAL HIGH (ref 3.6–11.0)

## 2015-06-25 LAB — GLUCOSE, CAPILLARY
GLUCOSE-CAPILLARY: 156 mg/dL — AB (ref 65–99)
Glucose-Capillary: 139 mg/dL — ABNORMAL HIGH (ref 65–99)
Glucose-Capillary: 102 mg/dL — ABNORMAL HIGH (ref 65–99)
Glucose-Capillary: 104 mg/dL — ABNORMAL HIGH (ref 65–99)

## 2015-06-25 LAB — BASIC METABOLIC PANEL
Anion gap: 5 (ref 5–15)
BUN: 31 mg/dL — AB (ref 6–20)
CALCIUM: 8.9 mg/dL (ref 8.9–10.3)
CHLORIDE: 123 mmol/L — AB (ref 101–111)
CO2: 16 mmol/L — AB (ref 22–32)
CREATININE: 1.21 mg/dL — AB (ref 0.44–1.00)
GFR calc non Af Amer: 38 mL/min — ABNORMAL LOW (ref >60)
GFR, EST AFRICAN AMERICAN: 44 mL/min — AB (ref >60)
Glucose, Bld: 164 mg/dL — ABNORMAL HIGH (ref 65–99)
Potassium: 3.9 mmol/L (ref 3.5–5.1)
SODIUM: 144 mmol/L (ref 135–145)

## 2015-06-25 MED ORDER — PROCHLORPERAZINE 25 MG RE SUPP
25.0000 mg | Freq: Three times a day (TID) | RECTAL | Status: DC | PRN
  Filled 2015-06-25: qty 1

## 2015-06-25 MED ORDER — BISACODYL 10 MG RE SUPP: 10.0000 mg | Freq: Every day | RECTAL | Status: DC | PRN

## 2015-06-25 NOTE — Progress Notes (Signed)
Palliative Medicine Inpatient Consult Follow Up Note   Name: Jasmine Briggs Date: 06/25/2015 MRN: [id number]  DOB: 04-30-1920  Referring Physician: Delfino Lovett, MD  Palliative Care consult requested for this 82 y.o. female for goals of medical therapy in patient with aspiration pneumonia and osteomyelitis.     TODAY'S DISCUSSIONS AND DECISIONS: I  Spoke with the one son in the room, Jorja Loa, and his wife.   I updated them both that she is not eating and also NOT URINATING (despite fluids she has gotten with IV meds etc).  I had reviewed the notes from [facility] where it is documented that pt had expressed that she wants to die at home --peacefully and naturally.  Those same notes documented pts wishes for other aggressive care at that time --so the pt seemed to be expressing mutually exclusive wishes.  Son, Jorja Loa, says pt had said she did not want to go to a place like Hospice Home --apparently this type of option was probably brought up in the past when pt was more coherent and verbal.    Jorja Loa and his wife are interested in hearing more about this option of Hospice in the Home.  I suggested that I can meet with all six adult children either later today or tomorrow morning --at a time convenient to all of them.  He will call them and find out a time when they can meet with me.    He mentioned that in their close family, that they would ALL have to agree.  I said that we certainly want no one left out and it is always good if everyone in the family is 'on the same page'.  But, legally, the decision makers are the adult children of the pt and those are the folks I need to meet with.  I would certainly be glad to talk to other family members after meeting first with the adult children of the pt.    Will update Dr. Sherryll Burger and nursing. I have already updated Care Mgr.  Other suggestions:  1--Calorie Count (ordered) 2--Consider DCing Eliquis ---given her worsening anemia 3--If family wants to continue  aggressive care instead of sending her home with hospice, she might need treatment of her yeast UTI (and we need to check for thrush --I was not able to see inside her mouth today). 4--Given glucose values all under 200, one could DC all glucose checks as a comfort measure EVEN IF family wants to continue other aggressive care like ABX.  5--She may need continuous IV fluids if family wants aggressive care.   I will also talk with family about feeding tubes and dialysis.  These would be a type of life support for this patient whose osteomyelitis and decubitus are very unlikely to heal --and whose dementia is worsening and nearing end-stage.    ----------------------------------------------------------------------------------  IMPRESSION: Sepsis --due to aspiration pneumonia Pneumonis --aspiration Acute aspiration this evening --on food or liquid or her own saliva ---SLP Rxd Dys 1 with thins Acute resp flre with hypoxia due to aspiration pneumonia --was off O2 but now back on  Leukocytosis --due to penumonia Esstl HTN H/O Gout H/O DVT upper arm on Eliquis H/O sacral wound with OSTEOMYELITIS --unhealable due to severe malnutrition and apparent CKD (unclear stage?) --was to have 6 wks of IV Vanco --hospitalized here twice since DC from [facility] ARF due to dehydration --resolving ---Cr 1.43 to 1.16 now Anemia of chronic Disease Severe malnutrition --Albumin of 2.3 on 3/31 when admitted dehydrated Hematuria (  this admission and last) Yeast UTI this admission --resulted today    REVIEW OF SYSTEMS:  Patient is not able to provide ROS due to illness and dementia  CODE STATUS: DNR --as of yesterday   PAST MEDICAL HISTORY: Past Medical History  Diagnosis Date  . Dementia   . Osteomyelitis (HCC)     sacral wound  . Hypertension   . Arthritis   . Diabetes mellitus without complication (HCC)     PAST SURGICAL HISTORY:  Past Surgical History  Procedure Laterality Date  .  Picc line placement    . Cardiac surgery      Vital Signs: BP 128/62 mmHg  Pulse 70  Temp(Src) 97.9 F (36.6 C) (Oral)  Resp 16  Ht 5\' 7"  (1.702 m)  Wt 79.788 kg (175 lb 14.4 oz)  BMI 27.54 kg/m2  SpO2 95% Filed Weights   06/21/15 1339 06/21/15 2009  Weight: 81.788 kg (180 lb 5 oz) 79.788 kg (175 lb 14.4 oz)    Estimated body mass index is 27.54 kg/(m^2) as calculated from the following:   Height as of this encounter: 5\' 7"  (1.702 m).   Weight as of this encounter: 79.788 kg (175 lb 14.4 oz).  PHYSICAL EXAM: Lying in medical bed Head is turned to right side She opens her eyes briefly when son, Jorja Loaim, calls her name Neck w/o JVD or TM Hrt rrr no m Lungs with decreased BS bases --the agonal respirations she was having yesterday (appearing to have been related to acute aspiration event) --have stopped. She is still using some accessory muscles. Abd obese and NT Ext with Ted hose in place ---toes are warm   LABS: CBC:    Component Value Date/Time   WBC 14.8* 06/25/2015 0737   WBC 6.9 10/31/2013 1709   HGB 7.2* 06/25/2015 0737   HGB 12.5 10/31/2013 1709   HCT 22.8* 06/25/2015 0737   HCT 38.9 10/31/2013 1709   PLT 241 06/25/2015 0737   PLT 237 10/31/2013 1709   MCV 85.9 06/25/2015 0737   MCV 94 10/31/2013 1709   NEUTROABS 19.0* 06/21/2015 1515   LYMPHSABS 0.4* 06/21/2015 1515   MONOABS 1.2* 06/21/2015 1515   EOSABS 0.0 06/21/2015 1515   BASOSABS 0.0 06/21/2015 1515   Comprehensive Metabolic Panel:    Component Value Date/Time   NA 144 06/25/2015 0737   NA 138 10/31/2013 1819   K 3.9 06/25/2015 0737   K 4.0 10/31/2013 1819   CL 123* 06/25/2015 0737   CL 109* 10/31/2013 1819   CO2 16* 06/25/2015 0737   CO2 21 10/31/2013 1819   BUN 31* 06/25/2015 0737   BUN 24* 10/31/2013 1819   CREATININE 1.21* 06/25/2015 0737   CREATININE 1.25 10/31/2013 1819   GLUCOSE 164* 06/25/2015 0737   GLUCOSE 152* 10/31/2013 1819   CALCIUM 8.9 06/25/2015 0737   CALCIUM 9.6  10/31/2013 1819   AST 19 06/21/2015 1515   AST 20 10/31/2013 1819   ALT 19 06/21/2015 1515   ALT 30 10/31/2013 1819   ALKPHOS 63 06/21/2015 1515   ALKPHOS 54 10/31/2013 1819   BILITOT 0.4 06/21/2015 1515   BILITOT 0.4 10/31/2013 1819   PROT 5.7* 06/21/2015 1515   PROT 6.9 10/31/2013 1819   ALBUMIN 2.3* 06/21/2015 1515   ALBUMIN 3.0* 10/31/2013 1819     More than 50% of the visit was spent in counseling/coordination of care: YES  Time Spent:  35 min

## 2015-06-25 NOTE — Progress Notes (Signed)
Speech Therapy Note: reviewed chart notes; consulted NSG/MD re: pt's status at this time. Pt appeared to have had an aspiration episode last pm. Reviewed aspiration precautions posted in room and rec'd strict supervision by NSG when feeding pt. No rec'd changes in diet consistency at this time; MD will f/u w/ this as indicated per talking w/ the family further re: poc and overall wishes for pt's quality of life. Consulted NSG re: pt who reported pt has been lethargic all day and not appropriate for safe po intake - rec'd holding meal trays until pt is more alert/awake. NSG agreed. ST services is available for any further education on swallowing and aspiration precautions as needed. MD agreed.

## 2015-06-25 NOTE — Plan of Care (Signed)
Problem: Physical Regulation: Goal: Will remain free from infection Outcome: Progressing WBC's remain elevated. Remains on IV Antibiotics.  Problem: Skin Integrity: Goal: Risk for impaired skin integrity will decrease Outcome: Progressing Chronic foley catheter. Turn Q2hrs. Provided with applications of barrier cream.

## 2015-06-25 NOTE — Progress Notes (Signed)
[facility] - [place] at [facility]   PATIENT NAME: Jasmine CaraSarah Briggs    MR#:  [id number]  DATE OF BIRTH:  1920-12-03  SUBJECTIVE:  CHIEF COMPLAINT:   Chief Complaint  Patient presents with  . Hemoptysis  gasping for breath, seem to be aspirating, 2 family members - still not quite ready for Hospice based on my discussion - i tried to convince them  REVIEW OF SYSTEMS:  Unable to get ROS due to dementia.  DRUG ALLERGIES:  No Known Allergies  VITALS:  Blood pressure 139/75, pulse 69, temperature 97.2 F (36.2 C), temperature source Axillary, resp. rate 18, height 5\' 7"  (1.702 m), weight 79.788 kg (175 lb 14.4 oz), SpO2 98 %. PHYSICAL EXAMINATION:  GENERAL:  82 y.o.-year-old patient lying in the bed with agonal, breathing. Obese. EYES: Pupils equal, round, reactive to light and accommodation. No scleral icterus. Extraocular muscles intact.  HEENT: Head atraumatic, normocephalic. Oropharynx and nasopharynx clear.  NECK:  Supple, no jugular venous distention. No thyroid enlargement, no tenderness.  LUNGS: agonal breathing CARDIOVASCULAR: S1, S2 normal. No murmurs, rubs, or gallops.  ABDOMEN: Soft, nontender, nondistended. Bowel sounds present. No organomegaly or mass.  EXTREMITIES: No pedal edema, cyanosis, or clubbing.  NEUROLOGIC: The patient is demented, not follow commands. PSYCHIATRIC: The patient is demented.  SKIN: has chronic sacral decub ulcer.   LABORATORY PANEL:   CBC  Recent Labs Lab 06/25/15 0737  WBC 14.8*  HGB 7.2*  HCT 22.8*  PLT 241   ------------------------------------------------------------------------------------------------------------------  Chemistries   Recent Labs Lab 06/21/15 1515 06/22/15 0748  06/25/15 0737  NA 141 143  < > 144  K 3.5 3.5  < > 3.9  CL 116* 115*  < > 123*  CO2 22 21*  < > 16*  GLUCOSE 154* 111*  < > 164*  BUN 27* 26*  < > 31*  CREATININE 1.11* 1.43*  < > 1.21*  CALCIUM 9.5 10.0  < > 8.9  MG  --   2.2  --   --   AST 19  --   --   --   ALT 19  --   --   --   ALKPHOS 63  --   --   --   BILITOT 0.4  --   --   --   < > = values in this interval not displayed. ------------------------------------------------------------------------------------------------------------------  Cardiac Enzymes  Recent Labs Lab 06/21/15 1515  TROPONINI 0.04*   ------------------------------------------------------------------------------------------------------------------  RADIOLOGY:  No results found.  EKG:   Orders placed or performed during the hospital encounter of 06/21/15  . ED EKG  . ED EKG    ASSESSMENT AND PLAN:   82 year old female with past medical history of dementia, sacral decubitus with osteomyelitis, adult failure to thrive, diabetes, hypertension, osteoarthritis who presents to the hospital due to shortness of breath, cough.  1. Sepsis- Present on admission and chest x-ray findings suggestive of a aspiration pneumonia. -Continue vanco, cefepime, Flagyl and follow CBC, blood, sputum cultures.  vancomycin.  2. Pneumonia-aspiration. Continue vanco, cefepime, Flagyl. -Keep nothing by mouth and reevaluated per speech evaluation today  3. Acute respiratory failure with hypoxia-due to pneumonia as mentioned above.  off oxygen, continue nebulizer when necessary.  4. Leukocytosis-secondary to the sepsis/pneumonia. Follow white cell count with IV antibacterial therapy.  5. Essential hypertension-hold antihypertensives given patient's sepsis and aspiration.  6. History of gout-no acute attack. continue allopurinol.  7. History of DVT in the upper extremity-continue Eliquis.  8. History of  sacral decubitus with osteomyelitis: 6 weeks of IV vanco - I doubt this will heal.  * ARF with dehydration. continue IV fluid support and follow-up BMP.  * Diabetes. on sliding scale.  She seem to be actively dying but family doesn't seem to be accepting this, still not quite willing to  work with Hospice (had some bad past experience based on what daughter is telling me - they feel they expedite death)   Very Poor prognosis, palliative care appreciated - they're planning to meet with family tomorrow. Hope they agree for home Hospice  All the records are reviewed and case discussed with Care Management/Social Worker. Management plans discussed with the patient, family and they are in agreement. ( son Sharyon Medicus @ [phone])  CODE STATUS: Full code  TOTAL TIME TAKING CARE OF THIS PATIENT:  35 Minutes.   Greater than 50% time was spent on coordination of care and D/W SON ON PHONE  POSSIBLE D/C IN 1-2 DAYS, DEPENDING ON CLINICAL CONDITION and Palliative care eval   [facility], Handsome Anglin M.D on 06/25/2015 at 7:15 PM  Between 7am to 6pm - Pager - [phone]  After 6pm go to [email or web address] - password [email or web address] [email or web address]  [place] [facility]  Office  [phone]  CC: Primary care physician; Kurtis Bushman, MD

## 2015-06-26 LAB — CULTURE, BLOOD (ROUTINE X 2)
CULTURE: NO GROWTH
Culture: NO GROWTH

## 2015-06-26 LAB — VANCOMYCIN, TROUGH: Vancomycin Tr: 18 ug/mL (ref 10–20)

## 2015-06-26 LAB — GLUCOSE, CAPILLARY
Glucose-Capillary: 84 mg/dL (ref 65–99)
Glucose-Capillary: 109 mg/dL — ABNORMAL HIGH (ref 65–99)

## 2015-06-26 MED ORDER — ERTAPENEM SODIUM 1 G IJ SOLR: 1.0000 g | Freq: Every evening | INTRAMUSCULAR | Status: DC

## 2015-06-26 MED ORDER — VANCOMYCIN HCL 500 MG IV SOLR: 500.0000 mg | INTRAVENOUS | Status: DC

## 2015-06-26 NOTE — Progress Notes (Signed)
Palliative Medicine Inpatient Consult Follow Up Note   Name: Jasmine Briggs Date: 06/26/2015 MRN: [id number]  DOB: Aug 13, 1920  Referring Physician: Delfino Lovett, MD  Palliative Care consult requested for this 82 y.o. female for goals of medical therapy in patient with aspiration Pneumonia and osteomyelitis of coccyx.    TODAY'S DISCUSSIONS AND DECISIONS: I had expected to get a call telling me what time the adult 6 children could all get together to meet with me. It was supposed to be either last evening or else this am. BUT, no one called.  I therefore called Rosalva Ferron, and apparently, he was not notified by his brother Jorja Loa about this planned meeting.    I spoke with Ramond and let him know that the pt's attending is planning on discharging pt today. This will mean we won't have time to put together a family meeting --especially since the word did not get to all the children.  We therefore needed to address the plan by phone.  I brought up continuing home health with IV ABX as one option and Home with Hospice as a second option.  After discussion the pros and cons of each, Ramond elected to go with continuing Home Health.  I mentioned that I would inform the care mgr and attending, if he would call all his siblings.  I have updated care team here.  Unfortunately, pt is at very high risk of frequent and early re-admissions. She is also at very high liklihood of dying within weeks to months. Her wound will not heal and her dysphagia will result in recurrent aspiration pneumonitis and pneumonias.  She had previously told family she wants to die at home and not in a hospital or a Hospice Home facility.  Family was advised to keep an open mind about calling in Hospice when the time comes. But for now, [facility] is to continue.    She will go home with a portable DNR form.  I will tell them to post it on refrigerator.   IMPRESSION: Sepsis --due to aspiration pneumonia Pneumonis  --aspiration Acute aspiration this evening --on food or liquid or her own saliva ---SLP Rxd Dys 1 with thins Acute resp flre with hypoxia due to aspiration pneumonia --was off O2 but now back on  Leukocytosis --due to penumonia Esstl HTN H/O Gout H/O DVT upper arm on Eliquis H/O sacral wound with OSTEOMYELITIS --unhealable due to severe malnutrition and apparent CKD (unclear stage?) --was to have 6 wks of IV Vanco --hospitalized here twice since DC from [facility] ARF due to dehydration --resolving ---Cr 1.43 to 1.16 now Anemia of chronic Disease Severe malnutrition --Albumin of 2.3 on 3/31 when admitted dehydrated Hematuria (this admission and last) Yeast UTI this admission --resulted today   REVIEW OF SYSTEMS:  Patient is not able to provide ROS due to dementia that is now advanced.  CODE STATUS: DNR   PAST MEDICAL HISTORY: Past Medical History  Diagnosis Date  . Dementia   . Osteomyelitis (HCC)     sacral wound  . Hypertension   . Arthritis   . Diabetes mellitus without complication (HCC)     PAST SURGICAL HISTORY:  Past Surgical History  Procedure Laterality Date  . Picc line placement    . Cardiac surgery      Vital Signs: BP 135/71 mmHg  Pulse 72  Temp(Src) 97.4 F (36.3 C) (Oral)  Resp 18  Ht  (1.702 m)  Wt 79.788 kg (175 lb 14.4 oz)  BMI 27.54 kg/m2  SpO2 92% Filed Weights   06/21/15 1339 06/21/15 2009  Weight: 81.788 kg (180 lb 5 oz) 79.788 kg (175 lb 14.4 oz)    Estimated body mass index is 27.54 kg/(m^2) as calculated from the following:   Height as of this encounter: 5\' 7"  (1.702 m).   Weight as of this encounter: 79.788 kg (175 lb 14.4 oz).  PHYSICAL EXAM: Lying in medical bed --has just consumed about half of one Ensure bottle with help of nurse. Opens eyes --not talking yet but focuses and follows some commands No JVD or TM Hrt rrr no m Lungs with ronchi and decreased BS bases Abd soft and NT Ext no mottling or  cyanosis  LABS: CBC:    Component Value Date/Time   WBC 14.8* 06/25/2015 0737   WBC 6.9 10/31/2013 1709   HGB 7.2* 06/25/2015 0737   HGB 12.5 10/31/2013 1709   HCT 22.8* 06/25/2015 0737   HCT 38.9 10/31/2013 1709   PLT 241 06/25/2015 0737   PLT 237 10/31/2013 1709   MCV 85.9 06/25/2015 0737   MCV 94 10/31/2013 1709   NEUTROABS 19.0* 06/21/2015 1515   LYMPHSABS 0.4* 06/21/2015 1515   MONOABS 1.2* 06/21/2015 1515   EOSABS 0.0 06/21/2015 1515   BASOSABS 0.0 06/21/2015 1515   Comprehensive Metabolic Panel:    Component Value Date/Time   NA 144 06/25/2015 0737   NA 138 10/31/2013 1819   K 3.9 06/25/2015 0737   K 4.0 10/31/2013 1819   CL 123* 06/25/2015 0737   CL 109* 10/31/2013 1819   CO2 16* 06/25/2015 0737   CO2 21 10/31/2013 1819   BUN 31* 06/25/2015 0737   BUN 24* 10/31/2013 1819   CREATININE 1.21* 06/25/2015 0737   CREATININE 1.25 10/31/2013 1819   GLUCOSE 164* 06/25/2015 0737   GLUCOSE 152* 10/31/2013 1819   CALCIUM 8.9 06/25/2015 0737   CALCIUM 9.6 10/31/2013 1819   AST 19 06/21/2015 1515   AST 20 10/31/2013 1819   ALT 19 06/21/2015 1515   ALT 30 10/31/2013 1819   ALKPHOS 63 06/21/2015 1515   ALKPHOS 54 10/31/2013 1819   BILITOT 0.4 06/21/2015 1515   BILITOT 0.4 10/31/2013 1819   PROT 5.7* 06/21/2015 1515   PROT 6.9 10/31/2013 1819   ALBUMIN 2.3* 06/21/2015 1515   ALBUMIN 3.0* 10/31/2013 1819    More than 50% of the visit was spent in counseling/coordination of care: YES  Time Spent: 25 min

## 2015-06-26 NOTE — Progress Notes (Signed)
Pharmacy Antibiotic Note  Jasmine Briggs is a 82 y.o. female admitted on 06/21/2015 with pneumonia and UTI. Patient was receiving vancomycin 500mg  q24 hours prior to admission for osteomyelitis from sacral decubitus ulcer.  Pharmacy has been consulted for monitoring and dosing of her vancomycin while in the hospital.  Patients family said patient's dose of vancomycin 500mg   was given everyday around 0800- 0830.   Patient is also receiving cefepime and metronidazole per ID's recommendation- for her wound and  suspected aspiration PNA.    Plan: Trough level today 18.  Will continue Vancomycin 500mg  daily at 0830. Vanocmcyin course scheduled to be complete of 06/29/15.   Patient scheduled for discharge today.     Height: 5\' 7"  (170.2 cm) Weight: 175 lb 14.4 oz (79.788 kg) IBW/kg (Calculated) : 61.6  Temp (24hrs), Avg:97.5 F (36.4 C), Min:97.2 F (36.2 C), Max:97.9 F (36.6 C)   Recent Labs Lab 06/21/15 1515 06/22/15 0748 06/23/15 0514 06/24/15 0455 06/25/15 0737 06/26/15 0832  WBC 20.6* 18.3* 16.3* 12.7* 14.8*  --   CREATININE 1.11* 1.43* 1.21* 1.16* 1.21*  --   VANCOTROUGH  --  22*  --   --   --  18    Estimated Creatinine Clearance: 32.3 mL/min (by C-G formula based on Cr of 1.21).    No Known Allergies  Antimicrobials this admission: Vancomycin  >>  Cefepime >>  Metronidazole>>  Microbiology results: 3/31 BCx: pending 3/31 UCx: pending  Thank you for allowing pharmacy to be a part of this patient's care.  Cher NakaiSheema Demmi Sindt, PharmD Pharmacy Resident 06/26/2015 9:30 AM

## 2015-06-26 NOTE — Care Management Important Message (Signed)
Important Message  Patient Details  Name: Jasmine Briggs MRN: [id number] Date of Birth: 11-Jan-1921   Medicare Important Message Given:  Yes    Olegario MessierKathy A Chelisa Hennen 06/26/2015, 10:23 AM

## 2015-06-26 NOTE — Care Management (Signed)
Dr. Orvan Falconerampbell spoke with son, Marcy SalvoRaymond. States that they would like to take mother home today with [facility] in place. Will be transported per  Rescue unit.  Discharge to home today per Dr. Sherryll BurgerShah.  Gwenette GreetBrenda S Valdis Bevill RN MSN CCM Care Management [phone]

## 2015-06-26 NOTE — Discharge Instructions (Signed)
Aspiration Pneumonia  Aspiration pneumonia is an infection in your lungs. It occurs when food, liquid, or stomach contents (vomit) are inhaled (aspirated) into your lungs. When these things get into your lungs, swelling (inflammation) and infection can occur. This can make it difficult for you to breathe. Aspiration pneumonia is a serious condition and can be life threatening. RISK FACTORS Aspiration pneumonia is more likely to occur when a person's cough (gag) reflex or ability to swallow has been decreased. Some things that can do this include:   Having a brain injury or disease, such as stroke, seizures, Parkinson's disease, dementia, or amyotrophic lateral sclerosis (ALS).   Being given general anesthetic for procedures.   Being in a coma (unconscious).   Having a narrowing of the tube that carries food to the stomach (esophagus).   Drinking too much alcohol. If a person passes out and vomits, vomit can be swallowed into the lungs.   Taking certain medicines, such as tranquilizers or sedatives.  SIGNS AND SYMPTOMS   Coughing after swallowing food or liquids.   Breathing problems, such as wheezing or shortness of breath.   Bluish skin. This can be caused by lack of oxygen.   Coughing up food or mucus. The mucus might contain blood, greenish material, or yellowish-white fluid (pus).   Fever.   Chest pain.   Being more tired than usual (fatigue).   Sweating more than usual.   Bad breath.  DIAGNOSIS  A physical exam will be done. During the exam, the health care provider will listen to your lungs with a stethoscope to check for:   Crackling sounds in the lungs.  Decreased breath sounds.  A rapid heartbeat. Various tests may be ordered. These may include:   Chest X-ray.   CT scan.   Swallowing study. This test looks at how food is swallowed and whether it goes into your breathing tube (trachea) or food pipe (esophagus).   Sputum culture. Saliva and  mucus (sputum) are collected from the lungs or the tubes that carry air to the lungs (bronchi). The sputum is then tested for bacteria.   Bronchoscopy. This test uses a flexible tube (bronchoscope) to see inside the lungs. TREATMENT  Treatment will usually include antibiotic medicines. Other medicines may also be used to reduce fever or pain. You may need to be treated in the hospital. In the hospital, your breathing will be carefully monitored. Depending on how well you are breathing, you may need to be given oxygen, or you may need breathing support from a breathing machine (ventilator). For people who fail a swallowing study, a feeding tube might be placed in the stomach, or they may be asked to avoid certain food textures or liquids when they eat. HOME CARE INSTRUCTIONS   Carefully follow any special eating instructions you were given, such as avoiding certain food textures or thickening liquids. This reduces the risk of developing aspiration pneumonia again.  Only take over-the-counter or prescription medicines as directed by your health care provider. Follow the directions carefully.   If you were prescribed antibiotics, take them as directed. Finish them even if you start to feel better.   Rest as instructed by your health care provider.   Keep all follow-up appointments with your health care provider.  SEEK MEDICAL CARE IF:   You develop worsening shortness of breath, wheezing, or difficulty breathing.   You develop a fever.   You have chest pain.  MAKE SURE YOU:   Understand these instructions.  Will watch   your condition.  Will get help right away if you are not doing well or get worse.   This information is not intended to replace advice given to you by your health care provider. Make sure you discuss any questions you have with your health care provider.   Document Released: 01/04/2009 Document Revised: 03/14/2013 Document Reviewed: 08/25/2012 Elsevier  Interactive Patient Education 2016 [employer]vier Inc.  

## 2015-06-26 NOTE — Progress Notes (Signed)
Pharmacy Antibiotic Note  Jasmine Briggs is a 82 y.o. female admitted on 06/21/2015 with pneumonia.  Pharmacy has been consulted for Cefepime dosing based on ID recommendation.   Plan: Will continue cefepime 2gm IV q24 hours based on renal function.  Patient scheduled to for possible DC today.   Height: 5\' 7"  (170.2 cm) Weight: 175 lb 14.4 oz (79.788 kg) IBW/kg (Calculated) : 61.6  Temp (24hrs), Avg:97.5 F (36.4 C), Min:97.2 F (36.2 C), Max:97.9 F (36.6 C)   Recent Labs Lab 06/21/15 1515 06/22/15 0748 06/23/15 0514 06/24/15 0455 06/25/15 0737 06/26/15 0832  WBC 20.6* 18.3* 16.3* 12.7* 14.8*  --   CREATININE 1.11* 1.43* 1.21* 1.16* 1.21*  --   VANCOTROUGH  --  22*  --   --   --  18    Estimated Creatinine Clearance: 32.3 mL/min (by C-G formula based on Cr of 1.21).    No Known Allergies  Antimicrobials this admission: Vancomycin 3/31 >>  Cefepime 3/31 >> Metronidazole 3/31 >>  Dose adjustments this admission: Continue Vancomycin 500 mg IV q24h.  Trough scheduled for today at 0730  Microbiology results: Results for orders placed or performed during the hospital encounter of 06/21/15  Urine culture     Status: None   Collection Time: 06/21/15  3:15 PM  Result Value Ref Range Status   Specimen Description URINE, RANDOM  Final   Special Requests NONE  Final   Culture >=100,000 COLONIES/mL CANDIDA ALBICANS  Final   Report Status 06/24/2015 FINAL  Final  Culture, blood (routine x 2)     Status: None (Preliminary result)   Collection Time: 06/21/15  6:54 PM  Result Value Ref Range Status   Specimen Description BLOOD LEFT ASSIST CONTROL  Final   Special Requests BOTTLES DRAWN AEROBIC AND ANAEROBIC 1CCAERO,1CCANA  Final   Culture NO GROWTH 4 DAYS  Final   Report Status PENDING  Incomplete  Culture, blood (routine x 2)     Status: None (Preliminary result)   Collection Time: 06/21/15  6:54 PM  Result Value Ref Range Status   Specimen Description BLOOD RIGHT ASSIST  CONTROL  Final   Special Requests BOTTLES DRAWN AEROBIC AND ANAEROBIC 1CCAERO,1CCANA  Final   Culture NO GROWTH 4 DAYS  Final   Report Status PENDING  Incomplete     Thank you for allowing pharmacy to be a part of this patient's care.  Cher NakaiSheema Joely Losier, PharmD Pharmacy Resident  06/26/2015 9:32 AM

## 2015-06-30 NOTE — Discharge Summary (Signed)
[facility] [place] at [facility]   PATIENT NAME: Jasmine Briggs    MR#:  [id number]  DATE OF BIRTH:  09/13/1920  DATE OF ADMISSION:  06/21/2015 ADMITTING PHYSICIAN: Houston Siren, MD  DATE OF DISCHARGE: 06/26/2015  4:08 PM  PRIMARY CARE PHYSICIAN: Kurtis Bushman, MD    ADMISSION DIAGNOSIS:  UTI (lower urinary tract infection) [N39.0] Right upper lobe pneumonia [J18.9] Hemoptysis [R04.2]  DISCHARGE DIAGNOSIS:  Active Problems:   Aspiration pneumonia (HCC)   SECONDARY DIAGNOSIS:   Past Medical History  Diagnosis Date  . Dementia   . Osteomyelitis (HCC)     sacral wound  . Hypertension   . Arthritis   . Diabetes mellitus without complication [facility])     HOSPITAL COURSE:  82 year old female with past medical history of dementia, sacral decubitus with osteomyelitis, adult failure to thrive, diabetes, hypertension, osteoarthritis admitted to the hospital due to shortness of breath, cough.  1. Sepsis- Present on admission due to aspiration pneumonia. Improved some with treatment although remains at high risk for recurrent aspirations  2. Pneumonia-aspiration. Improving with abx  3. Acute respiratory failure with hypoxia-due to pneumonia as mentioned above.  4. Leukocytosis-secondary to the sepsis/pneumonia. Follow white cell count with IV antibacterial therapy.  5. Essential hypertension-hold antihypertensives given patient's sepsis and aspiration.  6. History of gout-no acute attack. continue allopurinol.  7. History of DVT in the upper extremity-continue Eliquis.  8. History of sacral decubitus with osteomyelitis: 6 weeks of IV vanco - I doubt this will heal.  9. ARF with dehydration: improved with iv hydration.   After discussion the pros and cons of each, [name] elected to go with continuing Home Health.Unfortunately, pt is at very high risk of frequent and early re-admissions. She is also at very high liklihood of dying  within weeks to months. Her wound will not heal and her dysphagia will result in recurrent aspiration pneumonitis and pneumonias. She had previously told family she wants to die at home and not in a hospital or a Hospice Home facility. Family was advised to keep an open mind about calling in Hospice when the time comes. But for now, [facility] is to continue.   DISCHARGE CONDITIONS:   fair  CONSULTS OBTAINED:     DRUG ALLERGIES:  No Known Allergies  DISCHARGE MEDICATIONS:   Discharge Medication List as of 06/26/2015  3:09 PM    CONTINUE these medications which have CHANGED   Details  ertapenem (INVANZ) 1 g injection Inject 1 g into the muscle every evening. Reported on 06/12/2015, Starting 06/26/2015, Until Discontinued, Print    vancomycin 500 mg in sodium chloride 0.9 % 100 mL Inject 500 mg into the vein daily., Starting 06/26/2015, Until Discontinued, Print      CONTINUE these medications which have NOT CHANGED   Details  acetaminophen (TYLENOL) 500 MG tablet Take 500 mg by mouth every 8 (eight) hours as needed for mild pain., Until Discontinued, Historical Med    amLODipine (NORVASC) 10 MG tablet Take 10 mg by mouth daily. Reported on 06/12/2015, Until Discontinued, Historical Med    ibuprofen (ADVIL,MOTRIN) 600 MG tablet Take 600 mg by mouth every 6 (six) hours as needed for mild pain., Until Discontinued, Historical Med    polyethylene glycol (MIRALAX / GLYCOLAX) packet Take 17 g by mouth daily as needed for mild constipation. Reported on 06/12/2015, Until Discontinued, Historical Med    allopurinol (ZYLOPRIM) 100 MG tablet Take 100 mg by mouth daily.,  Until Discontinued, Historical Med    apixaban (ELIQUIS) 2.5 MG TABS tablet Take 2.5 mg by mouth 2 (two) times daily., Until Discontinued, Historical Med    HEPARIN SODIUM, PORCINE, IV Inject 2 mLs into the vein every Monday, Wednesday, and Friday. Heparin, porcine 100 units/mL., Until Discontinued, Historical Med     QUEtiapine (SEROQUEL) 25 MG tablet Take 1 tablet (25 mg total) by mouth at bedtime., Starting 06/15/2015, Until Discontinued, Print         DISCHARGE INSTRUCTIONS:    DIET:  Regular diet - strict aspiration precautions  DISCHARGE CONDITION:  Fair  ACTIVITY:  Activity as tolerated  OXYGEN:  Home Oxygen: No.   Oxygen Delivery: room air  DISCHARGE LOCATION:  Home with home health. Family refused Hospice services   If you experience worsening of your admission symptoms, develop shortness of breath, life threatening emergency, suicidal or homicidal thoughts you must seek medical attention immediately by calling 911 or calling your MD immediately  if symptoms less severe.  You Must read complete instructions/literature along with all the possible adverse reactions/side effects for all the Medicines you take and that have been prescribed to you. Take any new Medicines after you have completely understood and accpet all the possible adverse reactions/side effects.   Please note  You were cared for by a hospitalist during your hospital stay. If you have any questions about your discharge medications or the care you received while you were in the hospital after you are discharged, you can call the unit and asked to speak with the hospitalist on call if the hospitalist that took care of you is not available. Once you are discharged, your primary care physician will handle any further medical issues. Please note that NO REFILLS for any discharge medications will be authorized once you are discharged, as it is imperative that you return to your primary care physician (or establish a relationship with a primary care physician if you do not have one) for your aftercare needs so that they can reassess your need for medications and monitor your lab values.    On the day of Discharge:  VITAL SIGNS:  Blood pressure 109/69, pulse 73, temperature 97.4 F (36.3 C), temperature source Oral, resp.  rate 18, height  (1.702 m), weight 79.788 kg (175 lb 14.4 oz), SpO2 95 %.  PHYSICAL EXAMINATION:  GENERAL:  82 y.o.-year-old patient lying in the bed with no acute distress.  EYES: Pupils equal, round, reactive to light and accommodation. No scleral icterus. Extraocular muscles intact.  HEENT: Head atraumatic, normocephalic. Oropharynx and nasopharynx clear.  NECK:  Supple, no jugular venous distention. No thyroid enlargement, no tenderness.  LUNGS: Normal breath sounds bilaterally, no wheezing, rales,rhonchi or crepitation. No use of accessory muscles of respiration.  CARDIOVASCULAR: S1, S2 normal. No murmurs, rubs, or gallops.  ABDOMEN: Soft, non-tender, non-distended. Bowel sounds present. No organomegaly or mass.  EXTREMITIES: No pedal edema, cyanosis, or clubbing.  NEUROLOGIC: Cranial nerves II through XII are intact. Muscle strength 5/5 in all extremities. Sensation intact. Gait not checked.  PSYCHIATRIC: The patient is alert and oriented x 3.  SKIN: No obvious rash, lesion, or ulcer.  DATA REVIEW:   CBC  Recent Labs Lab 06/25/15 0737  WBC 14.8*  HGB 7.2*  HCT 22.8*  PLT 241    Chemistries   Recent Labs Lab 06/25/15 0737  NA 144  K 3.9  CL 123*  CO2 16*  GLUCOSE 164*  BUN 31*  CREATININE 1.21*  CALCIUM 8.9  Management plans discussed with the patient, family and they are in agreement.  CODE STATUS: DNR  TOTAL TIME TAKING CARE OF THIS PATIENT: 45 minutes.    [facility], Adrien Shankar M.D on 06/30/2015 at 7:51 PM  Between 7am to 6pm - Pager - [phone]  After 6pm go to [email or web address] - password [email or web address] [email or web address]  [facility] [place] [facility]  Office  [phone]  CC: Primary care physician; Kurtis BushmanGLADMAN, CHRISTINE D, MD   Note: This dictation was prepared with Dragon dictation along with smaller phrase technology. Any transcriptional errors that result from this process are unintentional.

## 2015-07-10 ENCOUNTER — Inpatient Hospital Stay
Admission: EM | Admit: 2015-07-10 | Discharge: 2015-07-26 | DRG: 871 | Disposition: A | Discharge status: Home Health | Payer: Medicare Other | Attending: Internal Medicine | Admitting: Internal Medicine

## 2015-07-10 ENCOUNTER — Emergency Department: Payer: Medicare Other

## 2015-07-10 DIAGNOSIS — E43 Unspecified severe protein-calorie malnutrition: Secondary | ICD-10-CM | POA: Diagnosis present

## 2015-07-10 DIAGNOSIS — Z9229 Personal history of other drug therapy: Secondary | ICD-10-CM

## 2015-07-10 DIAGNOSIS — L89153 Pressure ulcer of sacral region, stage 3: Secondary | ICD-10-CM | POA: Diagnosis present

## 2015-07-10 DIAGNOSIS — M4628 Osteomyelitis of vertebra, sacral and sacrococcygeal region: Secondary | ICD-10-CM | POA: Diagnosis present

## 2015-07-10 DIAGNOSIS — J690 Pneumonitis due to inhalation of food and vomit: Secondary | ICD-10-CM | POA: Diagnosis present

## 2015-07-10 DIAGNOSIS — R627 Adult failure to thrive: Secondary | ICD-10-CM

## 2015-07-10 DIAGNOSIS — E11319 Type 2 diabetes mellitus with unspecified diabetic retinopathy without macular edema: Secondary | ICD-10-CM | POA: Diagnosis present

## 2015-07-10 DIAGNOSIS — G9341 Metabolic encephalopathy: Secondary | ICD-10-CM | POA: Diagnosis present

## 2015-07-10 DIAGNOSIS — F0390 Unspecified dementia without behavioral disturbance: Secondary | ICD-10-CM | POA: Diagnosis present

## 2015-07-10 DIAGNOSIS — T68XXXA Hypothermia, initial encounter: Secondary | ICD-10-CM

## 2015-07-10 DIAGNOSIS — E870 Hyperosmolality and hypernatremia: Secondary | ICD-10-CM | POA: Diagnosis present

## 2015-07-10 DIAGNOSIS — R1310 Dysphagia, unspecified: Secondary | ICD-10-CM | POA: Diagnosis present

## 2015-07-10 DIAGNOSIS — N170 Acute kidney failure with tubular necrosis: Secondary | ICD-10-CM | POA: Diagnosis present

## 2015-07-10 DIAGNOSIS — I429 Cardiomyopathy, unspecified: Secondary | ICD-10-CM | POA: Diagnosis present

## 2015-07-10 DIAGNOSIS — Z7901 Long term (current) use of anticoagulants: Secondary | ICD-10-CM | POA: Diagnosis not present

## 2015-07-10 DIAGNOSIS — D62 Acute posthemorrhagic anemia: Secondary | ICD-10-CM

## 2015-07-10 DIAGNOSIS — I130 Hypertensive heart and chronic kidney disease with heart failure and stage 1 through stage 4 chronic kidney disease, or unspecified chronic kidney disease: Secondary | ICD-10-CM | POA: Diagnosis present

## 2015-07-10 DIAGNOSIS — Z7401 Bed confinement status: Secondary | ICD-10-CM | POA: Diagnosis not present

## 2015-07-10 DIAGNOSIS — E11649 Type 2 diabetes mellitus with hypoglycemia without coma: Secondary | ICD-10-CM | POA: Diagnosis not present

## 2015-07-10 DIAGNOSIS — L8990 Pressure ulcer of unspecified site, unspecified stage: Secondary | ICD-10-CM | POA: Diagnosis present

## 2015-07-10 DIAGNOSIS — H540 Blindness, both eyes: Secondary | ICD-10-CM | POA: Diagnosis present

## 2015-07-10 DIAGNOSIS — I959 Hypotension, unspecified: Secondary | ICD-10-CM | POA: Diagnosis not present

## 2015-07-10 DIAGNOSIS — Z6829 Body mass index (BMI) 29.0-29.9, adult: Secondary | ICD-10-CM | POA: Diagnosis not present

## 2015-07-10 DIAGNOSIS — R310 Gross hematuria: Secondary | ICD-10-CM

## 2015-07-10 DIAGNOSIS — Z8701 Personal history of pneumonia (recurrent): Secondary | ICD-10-CM | POA: Diagnosis not present

## 2015-07-10 DIAGNOSIS — Z86718 Personal history of other venous thrombosis and embolism: Secondary | ICD-10-CM | POA: Diagnosis not present

## 2015-07-10 DIAGNOSIS — I5041 Acute combined systolic (congestive) and diastolic (congestive) heart failure: Secondary | ICD-10-CM | POA: Diagnosis present

## 2015-07-10 DIAGNOSIS — Z66 Do not resuscitate: Secondary | ICD-10-CM | POA: Diagnosis present

## 2015-07-10 DIAGNOSIS — E8809 Other disorders of plasma-protein metabolism, not elsewhere classified: Secondary | ICD-10-CM | POA: Diagnosis present

## 2015-07-10 DIAGNOSIS — E876 Hypokalemia: Secondary | ICD-10-CM

## 2015-07-10 DIAGNOSIS — R680 Hypothermia, not associated with low environmental temperature: Secondary | ICD-10-CM | POA: Diagnosis not present

## 2015-07-10 DIAGNOSIS — Z950 Presence of cardiac pacemaker: Secondary | ICD-10-CM | POA: Diagnosis not present

## 2015-07-10 DIAGNOSIS — E440 Moderate protein-calorie malnutrition: Secondary | ICD-10-CM

## 2015-07-10 DIAGNOSIS — R531 Weakness: Secondary | ICD-10-CM

## 2015-07-10 DIAGNOSIS — Z87891 Personal history of nicotine dependence: Secondary | ICD-10-CM

## 2015-07-10 DIAGNOSIS — E1122 Type 2 diabetes mellitus with diabetic chronic kidney disease: Secondary | ICD-10-CM | POA: Diagnosis present

## 2015-07-10 DIAGNOSIS — B3749 Other urogenital candidiasis: Secondary | ICD-10-CM | POA: Diagnosis present

## 2015-07-10 DIAGNOSIS — N183 Chronic kidney disease, stage 3 (moderate): Secondary | ICD-10-CM | POA: Diagnosis present

## 2015-07-10 DIAGNOSIS — R4182 Altered mental status, unspecified: Secondary | ICD-10-CM | POA: Diagnosis present

## 2015-07-10 DIAGNOSIS — D638 Anemia in other chronic diseases classified elsewhere: Secondary | ICD-10-CM | POA: Diagnosis present

## 2015-07-10 DIAGNOSIS — A419 Sepsis, unspecified organism: Secondary | ICD-10-CM | POA: Diagnosis present

## 2015-07-10 DIAGNOSIS — R601 Generalized edema: Secondary | ICD-10-CM | POA: Diagnosis present

## 2015-07-10 DIAGNOSIS — N179 Acute kidney failure, unspecified: Secondary | ICD-10-CM

## 2015-07-10 DIAGNOSIS — E119 Type 2 diabetes mellitus without complications: Secondary | ICD-10-CM

## 2015-07-10 DIAGNOSIS — N189 Chronic kidney disease, unspecified: Secondary | ICD-10-CM

## 2015-07-10 DIAGNOSIS — I509 Heart failure, unspecified: Secondary | ICD-10-CM

## 2015-07-10 DIAGNOSIS — R319 Hematuria, unspecified: Secondary | ICD-10-CM

## 2015-07-10 DIAGNOSIS — J189 Pneumonia, unspecified organism: Secondary | ICD-10-CM | POA: Diagnosis not present

## 2015-07-10 HISTORY — DX: Gout, unspecified: M10.9

## 2015-07-10 LAB — URINALYSIS COMPLETE WITH MICROSCOPIC (ARMC ONLY)
Bilirubin Urine: NEGATIVE
GLUCOSE, UA: 50 mg/dL — AB
Ketones, ur: NEGATIVE mg/dL
Nitrite: NEGATIVE
PH: 5 (ref 5.0–8.0)
PROTEIN: 100 mg/dL — AB
SPECIFIC GRAVITY, URINE: 1.018 (ref 1.005–1.030)

## 2015-07-10 LAB — TROPONIN I

## 2015-07-10 LAB — COMPREHENSIVE METABOLIC PANEL
ALT: 20 U/L (ref 14–54)
AST: 33 U/L (ref 15–41)
Albumin: 2.7 g/dL — ABNORMAL LOW (ref 3.5–5.0)
Alkaline Phosphatase: 78 U/L (ref 38–126)
Anion gap: 11 (ref 5–15)
BILIRUBIN TOTAL: 0.4 mg/dL (ref 0.3–1.2)
BUN: 30 mg/dL — ABNORMAL HIGH (ref 6–20)
CHLORIDE: 117 mmol/L — AB (ref 101–111)
CO2: 18 mmol/L — ABNORMAL LOW (ref 22–32)
CREATININE: 1.35 mg/dL — AB (ref 0.44–1.00)
Calcium: 9.4 mg/dL (ref 8.9–10.3)
GFR, EST AFRICAN AMERICAN: 38 mL/min — AB (ref >60)
GFR, EST NON AFRICAN AMERICAN: 33 mL/min — AB (ref >60)
Glucose, Bld: 77 mg/dL (ref 65–99)
POTASSIUM: 4.1 mmol/L (ref 3.5–5.1)
Sodium: 146 mmol/L — ABNORMAL HIGH (ref 135–145)
TOTAL PROTEIN: 6.6 g/dL (ref 6.5–8.1)

## 2015-07-10 LAB — CBC WITH DIFFERENTIAL/PLATELET
Basophils Absolute: 0 10*3/uL (ref 0–0.1)
Basophils Relative: 1 %
EOS PCT: 0 %
Eosinophils Absolute: 0 10*3/uL (ref 0–0.7)
HCT: 25.5 % — ABNORMAL LOW (ref 35.0–47.0)
Hemoglobin: 8.1 g/dL — ABNORMAL LOW (ref 12.0–16.0)
LYMPHS ABS: 0.4 10*3/uL — AB (ref 1.0–3.6)
LYMPHS PCT: 10 %
MCH: 28.4 pg (ref 26.0–34.0)
MCHC: 31.7 g/dL — ABNORMAL LOW (ref 32.0–36.0)
MCV: 89.5 fL (ref 80.0–100.0)
MONO ABS: 0.7 10*3/uL (ref 0.2–0.9)
Monocytes Relative: 15 %
Neutro Abs: 3.3 10*3/uL (ref 1.4–6.5)
Neutrophils Relative %: 74 %
PLATELETS: 175 10*3/uL (ref 150–440)
RBC: 2.85 MIL/uL — ABNORMAL LOW (ref 3.80–5.20)
RDW: 22.3 % — AB (ref 11.5–14.5)
WBC: 4.5 10*3/uL (ref 3.6–11.0)

## 2015-07-10 LAB — PROTIME-INR
INR: 1.4
Prothrombin Time: 17.3 seconds — ABNORMAL HIGH (ref 11.4–15.0)

## 2015-07-10 LAB — LACTIC ACID, PLASMA: LACTIC ACID, VENOUS: 1.4 mmol/L (ref 0.5–2.0)

## 2015-07-10 LAB — BRAIN NATRIURETIC PEPTIDE: B NATRIURETIC PEPTIDE 5: 267 pg/mL — AB (ref 0.0–100.0)

## 2015-07-10 MED ORDER — ONDANSETRON HCL 4 MG PO TABS: 4.0000 mg | ORAL_TABLET | Freq: Four times a day (QID) | ORAL | Status: DC | PRN

## 2015-07-10 MED ORDER — FUROSEMIDE 10 MG/ML IJ SOLN
40.0000 mg | Freq: Once | INTRAMUSCULAR | Status: AC
  Administered 2015-07-10: 40 mg via INTRAVENOUS
  Filled 2015-07-10: qty 4

## 2015-07-10 MED ORDER — FUROSEMIDE 10 MG/ML IJ SOLN
4.0000 mg/h | INTRAVENOUS | Status: AC
  Administered 2015-07-11: 4 mg/h via INTRAVENOUS
  Filled 2015-07-10: qty 25

## 2015-07-10 MED ORDER — ALBUMIN HUMAN 25 % IV SOLN
50.0000 g | Freq: Four times a day (QID) | INTRAVENOUS | Status: AC
  Administered 2015-07-11 (×2): 50 g via INTRAVENOUS
  Filled 2015-07-10 (×2): qty 200

## 2015-07-10 MED ORDER — PIPERACILLIN-TAZOBACTAM 4.5 G IVPB
4.5000 g | Freq: Three times a day (TID) | INTRAVENOUS | Status: DC
  Administered 2015-07-11: 4.5 g via INTRAVENOUS
  Filled 2015-07-10 (×4): qty 100

## 2015-07-10 MED ORDER — ENOXAPARIN SODIUM 100 MG/ML ~~LOC~~ SOLN
1.0000 mg/kg | SUBCUTANEOUS | Status: DC
  Administered 2015-07-11 – 2015-07-12 (×2): 85 mg via SUBCUTANEOUS
  Filled 2015-07-10 (×2): qty 1

## 2015-07-10 MED ORDER — SODIUM CHLORIDE 0.9% FLUSH
3.0000 mL | Freq: Two times a day (BID) | INTRAVENOUS | Status: DC
  Administered 2015-07-10 – 2015-07-22 (×14): 3 mL via INTRAVENOUS

## 2015-07-10 MED ORDER — VANCOMYCIN HCL IN DEXTROSE 1-5 GM/200ML-% IV SOLN
1000.0000 mg | INTRAVENOUS | Status: DC
  Administered 2015-07-11: 1000 mg via INTRAVENOUS
  Filled 2015-07-10: qty 200

## 2015-07-10 MED ORDER — VANCOMYCIN HCL IN DEXTROSE 1-5 GM/200ML-% IV SOLN
1000.0000 mg | Freq: Once | INTRAVENOUS | Status: AC
  Administered 2015-07-10: 1000 mg via INTRAVENOUS
  Filled 2015-07-10: qty 200

## 2015-07-10 MED ORDER — ONDANSETRON HCL 4 MG/2ML IJ SOLN: 4.0000 mg | Freq: Four times a day (QID) | INTRAMUSCULAR | Status: DC | PRN

## 2015-07-10 NOTE — Progress Notes (Signed)
Pharmacy Antibiotic Note  Jasmine CaraSarah Briggs is a 82 y.o. female admitted on 07/10/2015 with pneumonia.  Pharmacy has been consulted for vancomycin and Zosyn dosing.  Plan: DW 71kg  Vd 50L kei 0.029 hr-1  T1/2 24 hours Vancomycin 1 gram q 24 hours ordered with stacked dosing. Level before 5th dose. Goal trough 15-20.  Zosyn 4.5 grams q 8 hours ordered for Pseudomonas risk of recent antibiotic usage.  Height: 5\' 7"  (170.2 cm) Weight: 185 lb 9.6 oz (84.188 kg) IBW/kg (Calculated) : 61.6  Temp (24hrs), Avg:92.7 F (33.7 C), Min:87.5 F (30.8 C), Max:94.5 F (34.7 C)   Recent Labs Lab 07/10/15 1821 07/10/15 1822  WBC 4.5  --   CREATININE 1.35*  --   LATICACIDVEN  --  1.4    Estimated Creatinine Clearance: 29.6 mL/min (by C-G formula based on Cr of 1.35).    No Known Allergies  Antimicrobials this admission: vancomycin Zosyn >>    >>   Dose adjustments this admission:   Microbiology results: 4/19 BCx: pending 4/19 UCx: pending  4/19 Sputum: pending    4/19 CXR: right sided opacities 4/19 Ua: LE(+) NO2(-) WBC TNTC   Thank you for allowing pharmacy to be a part of this patient's care.  Thurston Brendlinger S 07/10/2015 11:35 PM

## 2015-07-10 NOTE — ED Notes (Signed)
Pt transported to room 252. 

## 2015-07-10 NOTE — H&P (Signed)
[facility]   PATIENT NAME: Jasmine Briggs    MR#:  [id number]  DATE OF BIRTH:  [birth year]  DATE OF ADMISSION:  07/10/2015  PRIMARY CARE PHYSICIAN: Kurtis Bushman, MD   REQUESTING/REFERRING PHYSICIAN: Silverio Lay, MD  CHIEF COMPLAINT:   Chief Complaint  Patient presents with  . Altered Mental Status    HISTORY OF PRESENT ILLNESS:  Jasmine Briggs  is a 82 y.o. female who presents with Alteration of mental status and diffuse swelling. Patient was recently admitted here with pneumonia and UTI, was treated with antibiotics and improved some was sent home with a PICC line and IV antibiotics. She seemed to be doing okay at home, and her son was administering her medications. He states that yesterday she began to be more confused again and less talkative. She has dementia at baseline, but normally will interact and knows who he is and where she is. This morning she was significantly altered, and would only respond when she was moved. He also states that she had her Foley catheter removed yesterday, and today he noticed a lot of swelling all over her body. She was getting ertapenem and vancomycin IV at home. Today in the ED on evaluation she was hypothermic, altered, and diffusely edematous.  She was covered with some antibiotics in the ED, and hospitalists were called for admission.  PAST MEDICAL HISTORY:   Past Medical History  Diagnosis Date  . Dementia   . Osteomyelitis (HCC)     sacral wound  . Hypertension   . Arthritis   . Diabetes mellitus without complication (HCC)   . Gout     PAST SURGICAL HISTORY:   Past Surgical History  Procedure Laterality Date  . Picc line placement    . Cardiac surgery    . Pacemaker insertion      SOCIAL HISTORY:   Social History  Substance Use Topics  . Smoking status: Never Smoker   . Smokeless tobacco: Former Neurosurgeon    Types: Snuff  . Alcohol Use: No    FAMILY HISTORY:   Family History   Problem Relation Age of Onset  . Diabetes Maternal Aunt     DRUG ALLERGIES:  No Known Allergies  MEDICATIONS AT HOME:   Prior to Admission medications   Medication Sig Start Date End Date Taking? Authorizing Provider  acetaminophen (TYLENOL) 500 MG tablet Take 500 mg by mouth every 6 (six) hours as needed for mild pain.    Yes Historical Provider, MD  allopurinol (ZYLOPRIM) 100 MG tablet Take 100 mg by mouth daily.   Yes Historical Provider, MD  amLODipine (NORVASC) 10 MG tablet Take 10 mg by mouth daily.    Yes Historical Provider, MD  ertapenem (INVANZ) 1 g SOLR injection Inject 1 g into the vein daily. 06/26/15 07/15/15 Yes Historical Provider, MD  HEPARIN SODIUM, PORCINE, IV Inject 2.5 mLs into the vein daily. Heparin, porcine 100 units/mL.   Yes Historical Provider, MD  ibuprofen (ADVIL,MOTRIN) 600 MG tablet Take 600 mg by mouth every 6 (six) hours as needed for mild pain.   Yes Historical Provider, MD  polyethylene glycol (MIRALAX / GLYCOLAX) packet Take 17 g by mouth daily as needed for mild constipation.    Yes Historical Provider, MD  QUEtiapine (SEROQUEL) 25 MG tablet Take 1 tablet (25 mg total) by mouth at bedtime. 06/15/15  Yes Ramonita Lab, MD  vancomycin 500 mg in sodium chloride 0.9 % 100 mL Inject 500 mg into the  vein daily. 06/26/15  Yes Delfino LovettVipul Shah, MD    REVIEW OF SYSTEMS:  Review of Systems  Unable to perform ROS: acuity of condition     VITAL SIGNS:   Filed Vitals:   07/10/15 1931 07/10/15 1942 07/10/15 2100  BP:  106/73 101/63  Pulse:  71 70  Temp: 94.5 F (34.7 C) 94.5 F (34.7 C)   TempSrc: Rectal Rectal   Resp:  18   SpO2:  97% 95%   Wt Readings from Last 3 Encounters:  06/21/15 79.788 kg (175 lb 14.4 oz)  06/12/15 74.707 kg (164 lb 11.2 oz)    PHYSICAL EXAMINATION:  Physical Exam  Vitals reviewed. Constitutional: She appears well-developed and well-nourished. No distress.  HENT:  Head: Normocephalic and atraumatic.  Mouth/Throat: Oropharynx is  clear and moist.  Eyes: Conjunctivae are normal. No scleral icterus.  Patient is blind  Neck: Normal range of motion. Neck supple. No JVD present. No thyromegaly present.  Cardiovascular: Normal rate, regular rhythm and intact distal pulses.  Exam reveals no gallop and no friction rub.   No murmur heard. Respiratory: Effort normal. No respiratory distress. She has no wheezes. She has no rales.  Diffuse rhonchorous expiratory breath sounds, likely transmitted from upper airway, inspiratory sounds are largely clear except for some crackles in the bases.  GI: Soft. Bowel sounds are normal. She exhibits distension. There is no tenderness.  Musculoskeletal: Normal range of motion. She exhibits edema (2-3+ edema diffusely.).  No arthritis, no gout  Lymphadenopathy:    She has no cervical adenopathy.  Neurological:  Unable to fully assess due to patient's condition  Skin: Skin is warm and dry. No rash noted. No erythema.  Psychiatric:  Unable to fully assess due to patient's condition    LABORATORY PANEL:   CBC  Recent Labs Lab 07/10/15 1821  WBC 4.5  HGB 8.1*  HCT 25.5*  PLT 175   ------------------------------------------------------------------------------------------------------------------  Chemistries   Recent Labs Lab 07/10/15 1821  NA 146*  K 4.1  CL 117*  CO2 18*  GLUCOSE 77  BUN 30*  CREATININE 1.35*  CALCIUM 9.4  AST 33  ALT 20  ALKPHOS 78  BILITOT 0.4   ------------------------------------------------------------------------------------------------------------------  Cardiac Enzymes  Recent Labs Lab 07/10/15 1821  TROPONINI <0.03   ------------------------------------------------------------------------------------------------------------------  RADIOLOGY:  Dg Chest Port 1 View  07/10/2015  CLINICAL DATA:  82 year old female with altered mental status. EXAM: PORTABLE CHEST 1 VIEW COMPARISON:  Most recent radiographs 06/21/2015 FINDINGS:  Increase/progressive right pleural effusion that appears circumferential. Hazy opacity at the left lung base suspicious for pleural effusion. Bibasilar opacities. Cardiomegaly is unchanged. Patient is post median sternotomy. There is a left central line with tip in the atrial caval junction. Right perihilar and suprahilar opacities have increased. Question developing vascular congestion. Right-sided pacemaker remains in place. Chronic deformity of the right shoulder with dislocation. IMPRESSION: 1. Increased bilateral pleural effusions, right greater than left. Probable vascular congestion. Question fluid overload/CHF. 2. Increasing right perihilar and suprahilar opacities, may be vascular, atelectasis or pneumonia. Underlying pulmonary mass or right paratracheal adenopathy is difficult to exclude on this portable exam. Electronically Signed   By: Rubye OaksMelanie  Ehinger M.D.   On: 07/10/2015 18:58    EKG:   Orders placed or performed during the hospital encounter of 07/10/15  . EKG 12-Lead  . EKG 12-Lead    IMPRESSION AND PLAN:  Principal Problem:   Acute CHF (congestive heart failure) (HCC) - patient's blood pressure is low end normal, her albumin is  also significantly low. She has no prior history of heart failure, though she does have a pacemaker. Her cardiac enzyme was negative in the ED, BNP was a little over 200. Unclear what the source of her fluid overload would be. We will start her on a low rate Lasix drip, with concurrent albumin infusion to try and support her blood pressure as we diurese. We will get an echocardiogram and cardiology consult. Active Problems:   Anasarca - unclear etiology, although her hypoalbuminemia is likely exacerbating the problem. We will do gentle diuresis with albumin infusion as above.   Pressure ulcer - does not look acutely infected, healing well according to the son. Wound consult for recommendations.   Aspiration pneumonia (HCC) - she was still on IV antibiotics at  home for this, question of possible re-aspiration. We will switch her antibiotics to Zosyn plus vancomycin. Get sputum culture if possible. Blood and urine cultures are sent.   Dementia - patient is further altered from her baseline. Watch for expected improvement as her condition improves.   HTN (hypertension) - blood pressure borderline low, holding antihypertensives for now.   Type 2 diabetes mellitus (HCC) - sliding scale insulin with corresponding glucose checks  All the records are reviewed and case discussed with ED provider. Management plans discussed with the patient and/or family.  DVT PROPHYLAXIS: Systemic anticoagulation  GI PROPHYLAXIS: None  ADMISSION STATUS: Inpatient  CODE STATUS: DNR Code Status History    Date Active Date Inactive Code Status Order ID Comments User Context   06/24/2015  5:41 PM 06/26/2015  7:08 PM DNR [id number]  Suan Halter, MD Inpatient   06/21/2015  8:04 PM 06/24/2015  5:41 PM Full Code [id number]  Houston Siren, MD Inpatient   06/12/2015  8:35 PM 06/15/2015  7:56 PM Full Code [id number]  Enid Baas, MD Inpatient    Questions for Most Recent Historical Code Status (Order [id number])    Question Answer Comment   In the event of cardiac or respiratory ARREST Do not call a "code blue"    In the event of cardiac or respiratory ARREST Do not perform Intubation, CPR, defibrillation or ACLS    In the event of cardiac or respiratory ARREST Use medication by any route, position, wound care, and other measures to relive pain and suffering. May use oxygen, suction and manual treatment of airway obstruction as needed for comfort.     Advance Directive Documentation        Most Recent Value   Type of Advance Directive  Healthcare Power of Attorney   Pre-existing out of facility DNR order (yellow form or pink MOST form)     "MOST" Form in Place?        TOTAL TIME TAKING CARE OF THIS PATIENT: 50 minutes.    Hasani Diemer FIELDING 07/10/2015, 9:39  PM  [facility]  Office  [phone]  CC: Primary care physician; Kurtis Bushman, MD

## 2015-07-10 NOTE — ED Provider Notes (Addendum)
CSN: [id number]     Arrival date & time 07/10/15  1744 History   First MD Initiated Contact with Patient 07/10/15 1746     Chief Complaint  Patient presents with  . Altered Mental Status     (Consider location/radiation/quality/duration/timing/severity/associated sxs/prior Treatment) The history is provided by a relative and the EMS personnel.  Jasmine Briggs is a 82 y.o. female history dementia, sacral osteomyelitis, hypertension, Right arm DVT on Eliquis, recurrent pneumonias with PICC line on vanc and ertapenem here with lethargy, worse swelling. Patient currently lives at home with her son. Her son gives her medications every day as well as her antibiotics through the PICC line every day. Patient also had indwelling Foley that was removed yesterday by the visiting nurse. Since yesterday he noticed worsening swelling all over. He also noticed that she is more confused today and less responsive.         Level v caveat- dementia   Past Medical History  Diagnosis Date  . Dementia   . Osteomyelitis (HCC)     sacral wound  . Hypertension   . Arthritis   . Diabetes mellitus without complication [facility])    Past Surgical History  Procedure Laterality Date  . Picc line placement    . Cardiac surgery     Family History  Problem Relation Age of Onset  . Diabetes Maternal Aunt    Social History  Substance Use Topics  . Smoking status: Never Smoker   . Smokeless tobacco: Former Neurosurgeon    Types: Snuff  . Alcohol Use: No   OB History    No data available     Review of Systems  Unable to perform ROS: Mental status change  All other systems reviewed and are negative.     Allergies  Review of patient's allergies indicates no known allergies.  Home Medications   Prior to Admission medications   Medication Sig Start Date End Date Taking? Authorizing Provider  acetaminophen (TYLENOL) 500 MG tablet Take 500 mg by mouth every 8 (eight) hours as needed for mild pain.    Historical  Provider, MD  allopurinol (ZYLOPRIM) 100 MG tablet Take 100 mg by mouth daily.    Historical Provider, MD  amLODipine (NORVASC) 10 MG tablet Take 10 mg by mouth daily. Reported on 06/12/2015    Historical Provider, MD  apixaban (ELIQUIS) 2.5 MG TABS tablet Take 2.5 mg by mouth 2 (two) times daily.    Historical Provider, MD  ertapenem [facility]) 1 g injection Inject 1 g into the muscle every evening. Reported on 06/12/2015 06/26/15   Delfino Lovett, MD  HEPARIN SODIUM, PORCINE, IV Inject 2 mLs into the vein every Monday, Wednesday, and Friday. Heparin, porcine 100 units/mL.    Historical Provider, MD  ibuprofen (ADVIL,MOTRIN) 600 MG tablet Take 600 mg by mouth every 6 (six) hours as needed for mild pain.    Historical Provider, MD  polyethylene glycol (MIRALAX / GLYCOLAX) packet Take 17 g by mouth daily as needed for mild constipation. Reported on 06/12/2015    Historical Provider, MD  QUEtiapine (SEROQUEL) 25 MG tablet Take 1 tablet (25 mg total) by mouth at bedtime. 06/15/15   Ramonita Lab, MD  vancomycin 500 mg in sodium chloride 0.9 % 100 mL Inject 500 mg into the vein daily. 06/26/15   Vipul Sherryll Burger, MD   BP 106/73 mmHg  Pulse 71  Temp(Src) 94.5 F (34.7 C) (Rectal)  Resp 18  SpO2 97% Physical Exam  Constitutional:  Nonverbal, protecting airway  HENT:  Head: Normocephalic.  Eyes: Pupils are equal, round, and reactive to light.  Neck: Normal range of motion.  Cardiovascular: Normal rate, regular rhythm and normal heart sounds.   Pulmonary/Chest:  Diminished throughout   Abdominal: Soft. Bowel sounds are normal. She exhibits no distension. There is no tenderness. There is no rebound.  Musculoskeletal:  Stage 2 sacral decub, no active infection. R arm 3 + edema, 2+ edema other extremities   Neurological:  Tired, nonverbal. Moving all extremities.   Skin: Skin is warm.  Psychiatric:  Unable   Nursing note and vitals reviewed.   ED Course  Procedures (including critical care time) Labs  Review Labs Reviewed  CBC WITH DIFFERENTIAL/PLATELET - Abnormal; Notable for the following:    RBC 2.85 (*)    Hemoglobin 8.1 (*)    HCT 25.5 (*)    MCHC 31.7 (*)    RDW 22.3 (*)    Lymphs Abs 0.4 (*)    All other components within normal limits  COMPREHENSIVE METABOLIC PANEL - Abnormal; Notable for the following:    Sodium 146 (*)    Chloride 117 (*)    CO2 18 (*)    BUN 30 (*)    Creatinine, Ser 1.35 (*)    Albumin 2.7 (*)    GFR calc non Af Amer 33 (*)    GFR calc Af Amer 38 (*)    All other components within normal limits  PROTIME-INR - Abnormal; Notable for the following:    Prothrombin Time 17.3 (*)    All other components within normal limits  URINE CULTURE  CULTURE, BLOOD (ROUTINE X 2)  CULTURE, BLOOD (ROUTINE X 2)  TROPONIN I  LACTIC ACID, PLASMA  URINALYSIS COMPLETEWITH MICROSCOPIC ([facility] ONLY)  BRAIN NATRIURETIC PEPTIDE    Imaging Review Dg Chest Port 1 View  07/10/2015  CLINICAL DATA:  82 year old female with altered mental status. EXAM: PORTABLE CHEST 1 VIEW COMPARISON:  Most recent radiographs 06/21/2015 FINDINGS: Increase/progressive right pleural effusion that appears circumferential. Hazy opacity at the left lung base suspicious for pleural effusion. Bibasilar opacities. Cardiomegaly is unchanged. Patient is post median sternotomy. There is a left central line with tip in the atrial caval junction. Right perihilar and suprahilar opacities have increased. Question developing vascular congestion. Right-sided pacemaker remains in place. Chronic deformity of the right shoulder with dislocation. IMPRESSION: 1. Increased bilateral pleural effusions, right greater than left. Probable vascular congestion. Question fluid overload/CHF. 2. Increasing right perihilar and suprahilar opacities, may be vascular, atelectasis or pneumonia. Underlying pulmonary mass or right paratracheal adenopathy is difficult to exclude on this portable exam. Electronically Signed   By: Rubye OaksMelanie   Ehinger M.D.   On: 07/10/2015 18:58   I have personally reviewed and evaluated these images and lab results as part of my medical decision-making.   EKG Interpretation None       ED ECG REPORT I, YAO, DAVID, the attending physician, personally viewed and interpreted this ECG.   Date: 07/10/2015  EKG Time: 18:53 pm  Rate: 75  Rhythm: normal EKG, normal sinus rhythm  Axis: normal  Intervals:none  ST&T Change: paced    MDM   Final diagnoses:  None   Jasmine CaraSarah Paske is a 82 y.o. female here with AMS, anasarca. Consider worsening pneumonia vs anasarca from retention vs CHF. Will get labs, BNP, lactate, cultures, CXR. Will have nursing place foley and drain her bladder.   8:19 PM Hypothermic to 94. On bair hugger. CXR showed fluid overload. Has anasarca on exam.  Cr 1.3, baseline 1.1. Na 146. I think she is volume overloaded. Given lasix. Continue her home vanc/ertapenem. Hospitalist to admit for hypothermia, CHF.    Richardean Canal, MD 07/10/15 Babette Relic  Richardean Canal, MD 07/10/15 2019  Richardean Canal, MD 07/10/15 2019

## 2015-07-10 NOTE — ED Notes (Signed)
Pt from home via EMS c/o altered mental status. Per EMS family stated pt sleeping more than normal. Pt is non verbal, blind, has dementia, as well as hypertension.

## 2015-07-10 NOTE — ED Notes (Signed)
Pt has both arms edematous, history clot per son R arm. L arm unable to find vein to access. Per son this is usual case and why pt has PICC. Discussed with MD, both BC taken from PICC due to poor peripheral access.

## 2015-07-11 ENCOUNTER — Inpatient Hospital Stay
Admit: 2015-07-11 | Discharge: 2015-07-11 | Disposition: A | Discharge status: Home / Self Care | Payer: Medicare Other | Attending: Internal Medicine | Admitting: Internal Medicine

## 2015-07-11 LAB — GLUCOSE, CAPILLARY
GLUCOSE-CAPILLARY: 83 mg/dL (ref 65–99)
GLUCOSE-CAPILLARY: 82 mg/dL (ref 65–99)
Glucose-Capillary: 73 mg/dL (ref 65–99)
Glucose-Capillary: 81 mg/dL (ref 65–99)
Glucose-Capillary: 68 mg/dL (ref 65–99)
Glucose-Capillary: 143 mg/dL — ABNORMAL HIGH (ref 65–99)

## 2015-07-11 LAB — BASIC METABOLIC PANEL
ANION GAP: 10 (ref 5–15)
BUN: 31 mg/dL — ABNORMAL HIGH (ref 6–20)
CALCIUM: 9.1 mg/dL (ref 8.9–10.3)
CO2: 17 mmol/L — AB (ref 22–32)
CREATININE: 1.39 mg/dL — AB (ref 0.44–1.00)
Chloride: 120 mmol/L — ABNORMAL HIGH (ref 101–111)
GFR, EST AFRICAN AMERICAN: 37 mL/min — AB (ref >60)
GFR, EST NON AFRICAN AMERICAN: 32 mL/min — AB (ref >60)
Glucose, Bld: 95 mg/dL (ref 65–99)
Potassium: 3.8 mmol/L (ref 3.5–5.1)
SODIUM: 147 mmol/L — AB (ref 135–145)

## 2015-07-11 LAB — ABO/RH: ABO/RH(D): O POS

## 2015-07-11 LAB — CBC
HCT: 21.6 % — ABNORMAL LOW (ref 35.0–47.0)
HEMOGLOBIN: 7 g/dL — AB (ref 12.0–16.0)
MCH: 28.2 pg (ref 26.0–34.0)
MCHC: 32.4 g/dL (ref 32.0–36.0)
MCV: 87 fL (ref 80.0–100.0)
PLATELETS: 145 10*3/uL — AB (ref 150–440)
RBC: 2.49 MIL/uL — AB (ref 3.80–5.20)
RDW: 21.6 % — ABNORMAL HIGH (ref 11.5–14.5)
WBC: 4.9 10*3/uL (ref 3.6–11.0)

## 2015-07-11 LAB — ECHOCARDIOGRAM COMPLETE
HEIGHTINCHES: 67 in
Weight: 2969.6 oz

## 2015-07-11 LAB — PREPARE RBC (CROSSMATCH)

## 2015-07-11 LAB — MAGNESIUM: MAGNESIUM: 2.5 mg/dL — AB (ref 1.7–2.4)

## 2015-07-11 MED ORDER — FUROSEMIDE 10 MG/ML IJ SOLN
4.0000 mg/h | INTRAMUSCULAR | Status: AC
  Administered 2015-07-11: 4 mg/h via INTRAVENOUS
  Filled 2015-07-11: qty 25

## 2015-07-11 MED ORDER — KETOROLAC TROMETHAMINE 15 MG/ML IJ SOLN
15.0000 mg | Freq: Once | INTRAMUSCULAR | Status: AC
  Administered 2015-07-11: 15 mg via INTRAVENOUS
  Filled 2015-07-11: qty 1

## 2015-07-11 MED ORDER — SODIUM CHLORIDE 0.9 % IV SOLN
Freq: Once | INTRAVENOUS | Status: AC
  Administered 2015-07-11: 16:00:00 via INTRAVENOUS

## 2015-07-11 MED ORDER — PIPERACILLIN-TAZOBACTAM 3.375 G IVPB
3.3750 g | Freq: Three times a day (TID) | INTRAVENOUS | Status: DC
  Administered 2015-07-11 – 2015-07-15 (×11): 3.375 g via INTRAVENOUS
  Filled 2015-07-11 (×14): qty 50

## 2015-07-11 MED ORDER — VANCOMYCIN HCL 500 MG IV SOLR
500.0000 mg | INTRAVENOUS | Status: DC
  Administered 2015-07-12 – 2015-07-14 (×3): 500 mg via INTRAVENOUS
  Filled 2015-07-11 (×3): qty 500

## 2015-07-11 MED ORDER — DEXTROSE 50 % IV SOLN
12.5000 g | Freq: Once | INTRAVENOUS | Status: AC
  Administered 2015-07-11: 12.5 g via INTRAVENOUS
  Filled 2015-07-11: qty 50

## 2015-07-11 MED ORDER — FUROSEMIDE 10 MG/ML IJ SOLN
40.0000 mg | Freq: Once | INTRAMUSCULAR | Status: AC
  Administered 2015-07-11: 40 mg via INTRAVENOUS
  Filled 2015-07-11: qty 4

## 2015-07-11 MED ORDER — DEXTROSE 50 % IV SOLN
1.0000 | Freq: Once | INTRAVENOUS | Status: AC
  Administered 2015-07-11: 50 mL via INTRAVENOUS
  Filled 2015-07-11: qty 50

## 2015-07-11 NOTE — Progress Notes (Addendum)
Blood transfusion started today at 1626 and patient had no signs and symptoms of reaction. Now patient is starting to get short of breath due to fluid overload. Oxygen saturation is still 98%, but patient is shallow breathing and lungs sound more crackly than this morning. Paged Dr. Imogene Burnhen and MD stated to give a 1 time dose of 40mg  IV lasix on top of the lasix drip already going. Patient has not had 350mL of urine output from foley today. There has been some leakage on bed pad today, but unsure if it is from the foley or from patient's weeping abdomen. Will give lasix and continue to monitor. Patient's arms are now much more swollen than they were at the beginning of the shift and her skin has gotten even tighter. Dr. Imogene Burnhen made aware of all of these updates.

## 2015-07-11 NOTE — Progress Notes (Signed)
Patient is back from ECHO this morning. Still very drowsy, but does respond to pain when turned. Current axillary temp is 95.8. Patient is on bear hugger at highest temperature setting. Patient's skin feels warm to the touch. Pupils are non-reactive, but was told in report patient is blind. Foley catheter in place and draining well. Lasix drip running at 4 an hour, will be discontinued at 9AM. CBG 83 this AM. Will continue to closely monitor patient.

## 2015-07-11 NOTE — Care Management (Signed)
Readmit following a  recent UTI. Now with CHF and PNA. Hgb 7.0 IV antibiotics. Palliative care consult. [facility]. Patient lives at home with son. She is blind and has a history of Dementia. Prognosis poor.  Following.

## 2015-07-11 NOTE — Consult Note (Signed)
WOC wound consult note Reason for Consult:Stage 3 pressure injury to sacrum, present on admission. Patient has had recent decline in health, per the son.  Wound type:Stage 3 pressure injury Pressure Ulcer POA: Yes Measurement:2 cm x 0.2 cm x 0.2 cm (appears to have started as a fissure and is now complicated with pressure) Wound ZOX:WRUEbed:pink and moist Drainage (amount, consistency, odor) Minimal serosanguinous  No odor Periwound:Intact  Foley catheter in place.  Dressing procedure/placement/frequency:Cleanse sacral wound with soap and water and pat dry.  Apply silicone border foam dressing. Change every 3 days and PRN soilage.  Will not follow at this time.  Please re-consult if needed.  Maple HudsonKaren Eluzer Howdeshell RN BSN CWON Pager [phone]

## 2015-07-11 NOTE — Plan of Care (Signed)
Problem: Cardiac: Goal: Ability to achieve and maintain adequate cardiopulmonary perfusion will improve Outcome: Progressing Strict I&O's and daily weight

## 2015-07-11 NOTE — Progress Notes (Signed)
Pharmacy Antibiotic Note  Jasmine CaraSarah Briggs is a 82 y.o. female admitted on 07/10/2015 with pneumonia.  Pharmacy has been consulted for vancomycin and Zosyn dosing.  Patient was recently admitted and discharged on 4/5 with a PICC line on antibiotics for sacral decub and possible OM, UTI, and pneumonia. Patient was receiving vancomycin 500 mg IV daily PTA. Patient is currently on day #2 of antibiotics on admission with piperacillin/tazobactam 4.5 g IV q8h and vancomycin 1000 mg IV daily. Patient has received 2 doses of vancomycin during admission. Unclear how long patient has been receiving treatment for OM.  Plan: Will change order for vancomycin back to 500 mg IV daily and check trough on 4/23 at 0730. Goal vanc trough 15-20 mcg/mL.  Change piperacillin/tazobactam to 3.375 g IV q8h EI  Height: 5\' 7"  (170.2 cm) Weight: 185 lb 9.6 oz (84.188 kg) IBW/kg (Calculated) : 61.6  Temp (24hrs), Avg:95.5 F (35.3 C), Min:94.4 F (34.7 C), Max:97.5 F (36.4 C)   Recent Labs Lab 07/10/15 1821 07/10/15 1822 07/11/15 0454 07/11/15 0540  WBC 4.5  --   --  4.9  CREATININE 1.35*  --  1.39*  --   LATICACIDVEN  --  1.4  --   --     Estimated Creatinine Clearance: 28.8 mL/min (by C-G formula based on Cr of 1.39).    No Known Allergies  Antimicrobials this admission: vancomycin Zosyn >>    >>   Dose adjustments this admission:   Microbiology results: 4/19 BCx: pending 4/19 UCx: pending  4/19 Sputum: pending   Thank you for allowing pharmacy to be a part of this patient's care.  Cindi CarbonMary M Bryana Froemming, PharmD Clinical Pharmacist 07/11/2015 9:41 AM

## 2015-07-11 NOTE — Progress Notes (Signed)
Pt admitted to room 252, pt appears asleep, unable to answer questions. Unable to complete admission documentation. Pt's temperature has been low, pt is currently on bear hugger. Will continue to monitor.

## 2015-07-11 NOTE — Progress Notes (Signed)
Informed Dr. Imogene Burnhen of patient's mental status and that her temperature is coming up on bear hugger. Also informed MD of patient's swelling and that lasix drip discontinued at 9AM. No new orders at this time.

## 2015-07-11 NOTE — Progress Notes (Signed)
Patient's temperature is up to 96.7 with bear hugger still on. Skin to touch feels very warm, turned bear hugger down to medium temperature instead of high. Patient is now awake, but is not responding by speaking. Patient did say "that hurts" when she was turned for wound nurse to assess her sacral wound. Will continue to monitor.

## 2015-07-11 NOTE — Progress Notes (Signed)
Just received report on patient. Patient has already been taken down for a test this morning. Will check temp and CBG when she returns to the floor.

## 2015-07-11 NOTE — Progress Notes (Signed)
Patient's temperature has come up to 98.2. Dr. Imogene Burnhen stated okay to take bear hugger off. Will continue to monitor.

## 2015-07-11 NOTE — Progress Notes (Signed)
[facility] Physicians - [place] at [facility]   PATIENT NAME: Jasmine Briggs    MR#:  [id number]  DATE OF BIRTH:  [birth year]  SUBJECTIVE:  CHIEF COMPLAINT:   Chief Complaint  Patient presents with  . Altered Mental Status   The patient is confused, non-communicative. REVIEW OF SYSTEMS:  Unable to get ROS.  DRUG ALLERGIES:  No Known Allergies  VITALS:  Blood pressure 113/75, pulse 70, temperature 98 F (36.7 C), temperature source Axillary, resp. rate 16, height  (1.702 m), weight 84.188 kg (185 lb 9.6 oz), SpO2 98 %.  PHYSICAL EXAMINATION:  GENERAL:  82 y.o.-year-old patient lying in the bed with no acute distress.  EYES: blindness. HEENT: Head atraumatic, normocephalic.  NECK:  Supple, no jugular venous distention. No thyroid enlargement, no tenderness.  LUNGS: Normal breath sounds bilaterally, no wheezing, rales,rhonchi or crepitation. No use of accessory muscles of respiration.  CARDIOVASCULAR: S1, S2 normal. No murmurs, rubs, or gallops.  ABDOMEN: Soft, nontender, nondistended. Bowel sounds present. No organomegaly or mass.  EXTREMITIES: bilateral leg edema 2+, no cyanosis, or clubbing.  NEUROLOGIC: unable to exam. PSYCHIATRIC: The patient is not responsive to stimuli. SKIN: Sacral DU stage 3.    LABORATORY PANEL:   CBC  Recent Labs Lab 07/11/15 0540  WBC 4.9  HGB 7.0*  HCT 21.6*  PLT 145*   ------------------------------------------------------------------------------------------------------------------  Chemistries   Recent Labs Lab 07/10/15 1821 07/11/15 0454 07/11/15 1300  NA 146* 147*  --   K 4.1 3.8  --   CL 117* 120*  --   CO2 18* 17*  --   GLUCOSE 77 95  --   BUN 30* 31*  --   CREATININE 1.35* 1.39*  --   CALCIUM 9.4 9.1  --   MG  --   --  2.5*  AST 33  --   --   ALT 20  --   --   ALKPHOS 78  --   --   BILITOT 0.4  --   --     ------------------------------------------------------------------------------------------------------------------  Cardiac Enzymes  Recent Labs Lab 07/10/15 1821  TROPONINI <0.03   ------------------------------------------------------------------------------------------------------------------  RADIOLOGY:  Dg Chest Port 1 View  07/10/2015  CLINICAL DATA:  82 year old female with altered mental status. EXAM: PORTABLE CHEST 1 VIEW COMPARISON:  Most recent radiographs 06/21/2015 FINDINGS: Increase/progressive right pleural effusion that appears circumferential. Hazy opacity at the left lung base suspicious for pleural effusion. Bibasilar opacities. Cardiomegaly is unchanged. Patient is post median sternotomy. There is a left central line with tip in the atrial caval junction. Right perihilar and suprahilar opacities have increased. Question developing vascular congestion. Right-sided pacemaker remains in place. Chronic deformity of the right shoulder with dislocation. IMPRESSION: 1. Increased bilateral pleural effusions, right greater than left. Probable vascular congestion. Question fluid overload/CHF. 2. Increasing right perihilar and suprahilar opacities, may be vascular, atelectasis or pneumonia. Underlying pulmonary mass or right paratracheal adenopathy is difficult to exclude on this portable exam. Electronically Signed   By: Rubye Oaks M.D.   On: 07/10/2015 18:58    EKG:   Orders placed or performed during the hospital encounter of 07/10/15  . EKG 12-Lead  . EKG 12-Lead    ASSESSMENT AND PLAN:   Acute diastolicCHF (congestive heart failure) (HCC)  She was on a low rate Lasix drip, with concurrent albumin infusion to try and support her blood pressure as we diurese. Echocardiogram: EF: 45% - 50% . carefully diuresis following renal function and electrolytes per Dr. Lady Gary,  cardiology consult.   Anasarca. gentle diuresis with albumin infusion as above.  Sacral DU stage  3, wound care.  ARF, prerenal. Hold lasix drip for now, f/u BMP.  Hypernatremia. F/u BMP.  Aspiration pneumonia (HCC)  Continue Zosyn plus vancomycin. Get sputum culture if possible. F/u Blood and urine cultures.  UTI. Continue abx as above, f/u U/C. ID consult.   Acute metabolic encephalopathy and Dementia. NPO for now.    HTN (hypertension) - blood pressure borderline low, holding antihypertensives for now.  Type 2 diabetes mellitus (HCC) - sliding scale insulin with corresponding glucose checks  Anemia of chronic disease. Hb decreased from 8.1 to 7.0.  PRBC transfusion, f/u Hb. No active bleeding.  Poor prognosis. Palliative care consult.  I called her son, Rosalva FerronDark, Ramond, nobody answered the phone. All the records are reviewed and case discussed with Care Management/Social Workerr. Management plans discussed with the patient, family and they are in agreement.  CODE STATUS: DNR  TOTAL TIME TAKING CARE OF THIS PATIENT: 42 minutes.  Greater than 50% time was spent on coordination of care and face-to-face counseling.  POSSIBLE D/C IN >3 DAYS, DEPENDING ON CLINICAL CONDITION.   Shaune Pollackhen, Lavonya Hoerner M.D on 07/11/2015 at 2:56 PM  Between 7am to 6pm - Pager - [phone]  After 6pm go to [email or web address] - password [email or web address] [facility]  [facility]  Office  [phone]  CC: Primary care physician; Kurtis BushmanGLADMAN, CHRISTINE D, MD

## 2015-07-11 NOTE — Progress Notes (Signed)
*  PRELIMINARY RESULTS* Echocardiogram 2D Echocardiogram has been performed.  Jasmine Briggs 07/11/2015, 7:45 AM

## 2015-07-11 NOTE — Consult Note (Signed)
[facility] CLINIC CARDIOLOGY [facility]  CARDIOLOGY CONSULT NOTE  Patient ID: Jasmine Briggs MRN: [id number] DOB/AGE: 80/10/1920 80 y.o.  Admit date: 07/10/2015 Referring Physician Dr. Imogene Burn Primary Physician [facility] Primary Cardiologist [facility] Cardiology Reason for Consultation altered mental status and peripheral edema  HPI: Patient is a 82 year old female who was admitted with alteration in mental status and diffuse edema. She was recently admitted here with pneumonia and a UTI. She was sent home with PICC line and IV antibiotics. She became more confused and less verbal. Her son who cares for her home stated that she was unable to recognize him. She presented to the emergency room where she was noted to be relatively hypotensive. Chest x-ray bilateral pleural effusions with bibasilar opacities. She has a median sternotomy as well as a right-sided permanent pacemaker. There was increasing right perihilar and suprahilar opacities. He was given Lasix drip. Cardiac markers were normal. Echocardiogram revealed mildly reduced LV function of 45-50% with mild global hypokinesis. There was mild to moderate mitral regurgitation mild aortic insufficiency with sclerotic nonstenotic aortic valve. There was moderate tricuspid regurgitation. Patient is currently nonverbal. She is hypothermic. She is currently on a Lawyer. She is significantly anemic at this morning with a hemoglobin of 7.0 and hematocrit of 21.6. She has acute on chronic renal insufficiency with a GFR of 37 down from 44 2 weeks ago. She has a VVI pacemaker and devices followed a [employer]. Patient also has a history of diabetes, hypertension, blindness secondary to probable diabetic retinopathy.      Review of Systems  Unable to perform ROS: mental acuity    Past Medical History  Diagnosis Date  . Dementia   . Osteomyelitis (HCC)     sacral wound  . Hypertension   . Arthritis   . Diabetes mellitus without  complication (HCC)   . Gout     Family History  Problem Relation Age of Onset  . Diabetes Maternal Aunt     Social History   Social History  . Marital Status: Widowed    Spouse Name: N/A  . Number of Children: N/A  . Years of Education: N/A   Occupational History  . Not on file.   Social History Main Topics  . Smoking status: Never Smoker   . Smokeless tobacco: Former Neurosurgeon    Types: Snuff  . Alcohol Use: No  . Drug Use: No  . Sexual Activity: Not on file   Other Topics Concern  . Not on file   Social History Narrative   From home.     Past Surgical History  Procedure Laterality Date  . Picc line placement    . Cardiac surgery    . Pacemaker insertion       Prescriptions prior to admission  Medication Sig Dispense Refill Last Dose  . acetaminophen (TYLENOL) 500 MG tablet Take 500 mg by mouth every 6 (six) hours as needed for mild pain.    Past Week at Unknown time  . allopurinol (ZYLOPRIM) 100 MG tablet Take 100 mg by mouth daily.   07/10/2015 at Unknown time  . amLODipine (NORVASC) 10 MG tablet Take 10 mg by mouth daily.    07/10/2015 at Unknown time  . ertapenem (INVANZ) 1 g SOLR injection Inject 1 g into the vein daily.   07/10/2015 at Unknown time  . HEPARIN SODIUM, PORCINE, IV Inject 2.5 mLs into the vein daily. Heparin, porcine 100 units/mL.   07/10/2015 at Unknown time  .  ibuprofen (ADVIL,MOTRIN) 600 MG tablet Take 600 mg by mouth every 6 (six) hours as needed for mild pain.   Past Week at Unknown time  . polyethylene glycol (MIRALAX / GLYCOLAX) packet Take 17 g by mouth daily as needed for mild constipation.    Past Week at Unknown time  . QUEtiapine (SEROQUEL) 25 MG tablet Take 1 tablet (25 mg total) by mouth at bedtime. 30 tablet 0 07/09/2015 at Unknown time  . vancomycin 500 mg in sodium chloride 0.9 % 100 mL Inject 500 mg into the vein daily. 15 Dose 0 07/10/2015 at Unknown time    Physical Exam: Blood pressure 113/75, pulse 70, temperature 94.5 F (34.7 C),  temperature source Rectal, resp. rate 16, height 5\' 7"  (1.702 m), weight 84.188 kg (185 lb 9.6 oz), SpO2 98 %.   Wt Readings from Last 1 Encounters:  07/10/15 84.188 kg (185 lb 9.6 oz)     General appearance: no distress and slowed mentation Head: Normocephalic, without obvious abnormality, atraumatic Resp: diminished breath sounds bilaterally Cardio: regular rate and rhythm, systolic murmur: systolic ejection 1/6, crescendo and decrescendo at lower left sternal border and Ventricular paced rhythm GI: soft, non-tender; bowel sounds normal; no masses,  no organomegaly Pulses: 2+ and symmetric Neurologic: Mental status: alertness: lethargic, orientation: Not oriented  Labs:   Lab Results  Component Value Date   WBC 4.9 07/11/2015   HGB 7.0* 07/11/2015   HCT 21.6* 07/11/2015   MCV 87.0 07/11/2015   PLT 145* 07/11/2015    Recent Labs Lab 07/10/15 1821 07/11/15 0454  NA 146* 147*  K 4.1 3.8  CL 117* 120*  CO2 18* 17*  BUN 30* 31*  CREATININE 1.35* 1.39*  CALCIUM 9.4 9.1  PROT 6.6  --   BILITOT 0.4  --   ALKPHOS 78  --   ALT 20  --   AST 33  --   GLUCOSE 77 95   Lab Results  Component Value Date   TROPONINI <0.03 07/10/2015      Radiology:Bilateral pleural effusions  EKG: ventricular paced rhythm   ASSESSMENT AND PLAN:   82 year old female with history dementia, diabetes, hypertension, blindness secondary to diabetic retinopathy, history of high-grade heart block status post permanent pacemaker, admitted with altered mental status. Patient also has a history of chronic kidney disease. She currently is nonverbal. She was recently admitted with pneumonia treated with antibiotics and sent home with a PICC line. Her son apparently was dosing her medications. She was somewhat verbal with him by report until day of admission when she was nonverbal. In the emergency room she was noted to have bilateral pleural effusions. She also had evidence of relative hypotension. She is ruled  out for myocardial infarction by serial troponins. Electrocardiogram shows ventricular paced rhythm. Echocardiogram revealed near normal to mildly reduced LV function EF 45-50% with mild to moderate MR and TR. She currently is anemic with hemoglobin and hematocrit 7 and 21.6. Her serum creatinine has worsened 1.39. She is currently being diuresed. Etiology of her pleural effusions is likely multifactorial. Reduced LV function may be playing a role somewhat although this appears to be diastolic heart failure. She also has evidence of possible airspace disease. We carefully diuresis following renal function and electrolytes. We'll need to consider transfusion. Would continue with empiric antibiotics. Poor prognosis. We'll follow with you.   Signed: Dalia HeadingFATH,Merve Hotard A. MD, [facility] 07/11/2015, 8:41 AM

## 2015-07-12 DIAGNOSIS — R680 Hypothermia, not associated with low environmental temperature: Secondary | ICD-10-CM

## 2015-07-12 DIAGNOSIS — H540 Blindness, both eyes: Secondary | ICD-10-CM

## 2015-07-12 DIAGNOSIS — N179 Acute kidney failure, unspecified: Secondary | ICD-10-CM

## 2015-07-12 DIAGNOSIS — I5041 Acute combined systolic (congestive) and diastolic (congestive) heart failure: Secondary | ICD-10-CM

## 2015-07-12 DIAGNOSIS — A419 Sepsis, unspecified organism: Principal | ICD-10-CM

## 2015-07-12 DIAGNOSIS — J189 Pneumonia, unspecified organism: Secondary | ICD-10-CM

## 2015-07-12 DIAGNOSIS — F0390 Unspecified dementia without behavioral disturbance: Secondary | ICD-10-CM

## 2015-07-12 DIAGNOSIS — J90 Pleural effusion, not elsewhere classified: Secondary | ICD-10-CM

## 2015-07-12 DIAGNOSIS — G9341 Metabolic encephalopathy: Secondary | ICD-10-CM

## 2015-07-12 DIAGNOSIS — E43 Unspecified severe protein-calorie malnutrition: Secondary | ICD-10-CM

## 2015-07-12 DIAGNOSIS — Z515 Encounter for palliative care: Secondary | ICD-10-CM

## 2015-07-12 DIAGNOSIS — I959 Hypotension, unspecified: Secondary | ICD-10-CM

## 2015-07-12 LAB — BASIC METABOLIC PANEL
ANION GAP: 9 (ref 5–15)
BUN: 32 mg/dL — ABNORMAL HIGH (ref 6–20)
CHLORIDE: 119 mmol/L — AB (ref 101–111)
CO2: 20 mmol/L — AB (ref 22–32)
Calcium: 9.5 mg/dL (ref 8.9–10.3)
Creatinine, Ser: 1.59 mg/dL — ABNORMAL HIGH (ref 0.44–1.00)
GFR calc non Af Amer: 27 mL/min — ABNORMAL LOW (ref >60)
GFR, EST AFRICAN AMERICAN: 31 mL/min — AB (ref >60)
GLUCOSE: 117 mg/dL — AB (ref 65–99)
Potassium: 3.6 mmol/L (ref 3.5–5.1)
Sodium: 148 mmol/L — ABNORMAL HIGH (ref 135–145)

## 2015-07-12 LAB — TYPE AND SCREEN
ABO/RH(D): O POS
Antibody Screen: NEGATIVE
UNIT DIVISION: 0

## 2015-07-12 LAB — GLUCOSE, CAPILLARY
GLUCOSE-CAPILLARY: 99 mg/dL (ref 65–99)
GLUCOSE-CAPILLARY: 130 mg/dL — AB (ref 65–99)
Glucose-Capillary: 100 mg/dL — ABNORMAL HIGH (ref 65–99)

## 2015-07-12 LAB — CBC
HCT: 29.3 % — ABNORMAL LOW (ref 35.0–47.0)
HEMOGLOBIN: 9.7 g/dL — AB (ref 12.0–16.0)
MCH: 28.3 pg (ref 26.0–34.0)
MCHC: 33 g/dL (ref 32.0–36.0)
MCV: 85.8 fL (ref 80.0–100.0)
Platelets: 172 10*3/uL (ref 150–440)
RBC: 3.41 MIL/uL — AB (ref 3.80–5.20)
RDW: 20.3 % — ABNORMAL HIGH (ref 11.5–14.5)
WBC: 9.2 10*3/uL (ref 3.6–11.0)

## 2015-07-12 MED ORDER — ENOXAPARIN SODIUM 40 MG/0.4ML ~~LOC~~ SOLN: 40.0000 mg | SUBCUTANEOUS | Status: DC

## 2015-07-12 MED ORDER — FUROSEMIDE 10 MG/ML IJ SOLN
4.0000 mg/h | INTRAVENOUS | Status: AC
  Administered 2015-07-12: 4 mg/h via INTRAVENOUS
  Filled 2015-07-12: qty 25

## 2015-07-12 MED ORDER — ENOXAPARIN SODIUM 30 MG/0.3ML ~~LOC~~ SOLN
30.0000 mg | SUBCUTANEOUS | Status: DC
  Administered 2015-07-12 – 2015-07-14 (×3): 30 mg via SUBCUTANEOUS
  Filled 2015-07-12 (×3): qty 0.3

## 2015-07-12 NOTE — Progress Notes (Addendum)
[facility] Physicians - [place] at [facility]   PATIENT NAME: Jasmine Briggs    MR#:  [id number]  DATE OF BIRTH:  1921-01-27  SUBJECTIVE:  CHIEF COMPLAINT:   Chief Complaint  Patient presents with  . Altered Mental Status   The patient is confused, non-communicative. REVIEW OF SYSTEMS:  Unable to get ROS.  DRUG ALLERGIES:  No Known Allergies  VITALS:  Blood pressure 117/70, pulse 70, temperature 97.5 F (36.4 C), temperature source Oral, resp. rate 20, height  (1.702 m), weight 84.188 kg (185 lb 9.6 oz), SpO2 98 %.  PHYSICAL EXAMINATION:  GENERAL:  82 y.o.-year-old patient lying in the bed with no acute distress.  EYES: blindness. HEENT: Head atraumatic, normocephalic.  NECK:  Supple, no jugular venous distention. No thyroid enlargement, no tenderness.  LUNGS: Normal breath sounds bilaterally, no wheezing, bilateral rales. No use of accessory muscles of respiration.  CARDIOVASCULAR: S1, S2 normal. No murmurs, rubs, or gallops.  ABDOMEN: Soft, nontender, nondistended. Bowel sounds present. No organomegaly or mass.  EXTREMITIES: bilateral arm and leg edema 2+, no cyanosis, or clubbing.  NEUROLOGIC: unable to exam. PSYCHIATRIC: The patient is responsive is opening eyes to verbal stimuli. Noncommunicative. SKIN: Sacral DU stage 3.    LABORATORY PANEL:   CBC  Recent Labs Lab 07/12/15 0644  WBC 9.2  HGB 9.7*  HCT 29.3*  PLT 172   ------------------------------------------------------------------------------------------------------------------  Chemistries   Recent Labs Lab 07/10/15 1821  07/11/15 1300 07/12/15 0644  NA 146*  < >  --  148*  K 4.1  < >  --  3.6  CL 117*  < >  --  119*  CO2 18*  < >  --  20*  GLUCOSE 77  < >  --  117*  BUN 30*  < >  --  32*  CREATININE 1.35*  < >  --  1.59*  CALCIUM 9.4  < >  --  9.5  MG  --   --  2.5*  --   AST 33  --   --   --   ALT 20  --   --   --   ALKPHOS 78  --   --   --   BILITOT 0.4  --   --   --    < > = values in this interval not displayed. ------------------------------------------------------------------------------------------------------------------  Cardiac Enzymes  Recent Labs Lab 07/10/15 1821  TROPONINI <0.03   ------------------------------------------------------------------------------------------------------------------  RADIOLOGY:  Dg Chest Port 1 View  07/10/2015  CLINICAL DATA:  82 year old female with altered mental status. EXAM: PORTABLE CHEST 1 VIEW COMPARISON:  Most recent radiographs 06/21/2015 FINDINGS: Increase/progressive right pleural effusion that appears circumferential. Hazy opacity at the left lung base suspicious for pleural effusion. Bibasilar opacities. Cardiomegaly is unchanged. Patient is post median sternotomy. There is a left central line with tip in the atrial caval junction. Right perihilar and suprahilar opacities have increased. Question developing vascular congestion. Right-sided pacemaker remains in place. Chronic deformity of the right shoulder with dislocation. IMPRESSION: 1. Increased bilateral pleural effusions, right greater than left. Probable vascular congestion. Question fluid overload/CHF. 2. Increasing right perihilar and suprahilar opacities, may be vascular, atelectasis or pneumonia. Underlying pulmonary mass or right paratracheal adenopathy is difficult to exclude on this portable exam. Electronically Signed   By: Rubye Oaks M.D.   On: 07/10/2015 18:58    EKG:   Orders placed or performed during the hospital encounter of 07/10/15  . EKG 12-Lead  . EKG 12-Lead  ASSESSMENT AND PLAN:   Acute diastolicCHF (congestive heart failure) (HCC)  She was on a low rate Lasix drip, with concurrent albumin infusion. Echocardiogram: EF: 45% - 50% . carefully diuresis following renal function and electrolytes per Dr. Lady GaryFath,  cardiology consult.   Anasarca. gentle diuresis with albumin infusion as above. Lasix drip was on hold due  to worsening renal function in the low side blood pressure.  Sacral DU stage 3, wound care.  ARF, prerenal. Worsening. Try to resume lasix if BP tolerates, f/u BMP. Nephrology consult.  Hypernatremia. Worsening, F/u BMP.  History of osteomyelitis, Aspiration pneumonia and UTI. Continue Zosyn plus vancomycin. Get sputum culture if possible. F/u Blood and urine cultures. F/u Dr. Sampson GoonFitzgerald.   Acute metabolic encephalopathy and Dementia. NPO for now. Get speech study.Marland Kitchen.    HTN (hypertension) - blood pressure borderline low, holding antihypertensives for now.  Type 2 diabetes mellitus (HCC) - sliding scale insulin with corresponding glucose checks  Anemia of chronic disease. Hb decreased from 8.1 to 7.0.  Status post 1 units PRBC transfusion, Hb increased to 9.7. No active bleeding.  I discussed the patient's worsening condition and very poor prognosis the the patient's daughter.  Poor prognosis. Palliative care consult. Dr. Orvan Falconerampbell had a meeting with family members. Per Dr. Orvan Falconerampbell, FM 3/6 children) want further treatment without hospice care or SNF placement.  I discussed with Dr. Orvan Falconerampbell and Dr. Sampson GoonFitzgerald. All the records are reviewed and case discussed with Care Management/Social Workerr. Management plans discussed with the patient, her daughter and they are in agreement.  CODE STATUS: DNR  TOTAL TIME TAKING CARE OF THIS PATIENT: 53 minutes.  Greater than 50% time was spent on coordination of care and face-to-face counseling.  POSSIBLE D/C IN >3 DAYS, DEPENDING ON CLINICAL CONDITION.   Shaune Pollackhen, Steffie Waggoner M.D on 07/12/2015 at 1:34 PM  Between 7am to 6pm - Pager - [phone]  After 6pm go to [email or web address] - password [email or web address] [facility]  [facility]  Office  [phone]  CC: Primary care physician; Kurtis BushmanGLADMAN, CHRISTINE D, MD

## 2015-07-12 NOTE — Evaluation (Signed)
Clinical/Bedside Swallow Evaluation Patient Details  Name: Jasmine Briggs MRN: [id number] Date of Birth: [birth year]  Today's Date: 07/12/2015 Time: SLP Start Time (ACUTE ONLY): 1400 SLP Stop Time (ACUTE ONLY): 1500 SLP Time Calculation (min) (ACUTE ONLY): 60 min  Past Medical History:  Past Medical History  Diagnosis Date  . Dementia   . Osteomyelitis (HCC)     sacral wound  . Hypertension   . Arthritis   . Diabetes mellitus without complication (HCC)   . Gout    Past Surgical History:  Past Surgical History  Procedure Laterality Date  . Picc line placement    . Cardiac surgery    . Pacemaker insertion     HPI:  Pt is a 82 y.o. female with a known history of Dementia, sacral decubitus with osteomyelitis - stage 3 now per chart, hypertension, diabetes, adult failure to thrive who presents with Alteration of Mental Status and diffuse swelling over her body. Patient was recently admitted here with pneumonia and UTI and was treated with antibiotics and improved some. She was discharged home with a PICC line and IV antibiotics. Per notes from recent admissions, pt was evaluated by ST and had been tolerating puree diet with Nectar then thin liquids upon discharge. Currently, pt is NPO sec. to lethargy and AMS. Pt mumbled intermittently but unable to determine what pt said. Pt required max. cues (verbal/tactile) to participate in the session/po trials. Total assistance w/ strict aspiration precautions.    Assessment / Plan / Recommendation Clinical Impression  Pt appears to present w/ moderate-severe oral phase dysphagia but no overt s/s of pharygneal dysphagia w/ po trials given this evaluation - suspect oral phase of swallowing is impacted by declined Cognitive status and Dementia. Pt exhibited increased mastication of puree boluses; slow transit w/ Nectar liquids. pt eventually cleared given time b/t trials. Pt required mod-max verbal/tactile cues during bolus presentation to open mouth to  accept boluses, however, pt did not readily open mouth to receive boluses. Pt required max. cues and feeding assistance sec. to Cognitive decline and legal blindness baseline. Family and NSG consulted and agreed w/ initiating a Dys. 1 diet w/ Nectar liquids; strict aspiration precautions; feeding assistance w/ cues and reducing distractions during meals. Rec. meds in Puree - Crushed as able.     Aspiration Risk  Moderate aspiration risk    Diet Recommendation  Dys. 1 w/ Nectar liquids via TSP; strict aspiration precautions; feeding assistance at all meals  Medication Administration: Crushed with puree    Other  Recommendations Recommended Consults:  (Dietician consult) Oral Care Recommendations: Oral care BID;Staff/trained caregiver to provide oral care Other Recommendations: Order thickener from pharmacy;Prohibited food (jello, ice cream, thin soups);Remove water pitcher   Follow up Recommendations  Skilled Nursing facility    Frequency and Duration min 3x week  2 weeks       Prognosis Prognosis for Safe Diet Advancement: Fair Barriers to Reach Goals: Cognitive deficits;Severity of deficits      Swallow Study   General Date of Onset: 07/10/15 HPI: Pt is a 82 y.o. female with a known history of Dementia, sacral decubitus with osteomyelitis - stage 3 now per chart, hypertension, diabetes, adult failure to thrive who presents with Alteration of Mental Status and diffuse swelling over her body. Patient was recently admitted here with pneumonia and UTI and was treated with antibiotics and improved some. She was discharged home with a PICC line and IV antibiotics. Per notes from recent admissions, pt was evaluated by ST and  had been tolerating puree diet with Nectar then thin liquids upon discharge. Currently, pt is NPO sec. to lethargy and AMS. Pt mumbled intermittently but unable to determine what pt said. Pt required max. cues (verbal/tactile) to participate in the session/po trials. Total  assistance w/ strict aspiration precautions.  Type of Study: Bedside Swallow Evaluation Previous Swallow Assessment: Pt was evaluated by ST 05/2015 and 06/2015 during admissions. At the time of the last admission, pt demonstrated oral phase dysphagia and placed on puree diet w/thin liquids.  Diet Prior to this Study: Dysphagia 1 (puree);Thin liquids Temperature Spikes Noted: No (wbc 9.2) Respiratory Status: Room air History of Recent Intubation: No Behavior/Cognition: Confused;Lethargic/Drowsy;Doesn't follow directions;Requires cueing Oral Cavity Assessment:  (unable to fully assess; missing dentition) Oral Care Completed by SLP: Recent completion by staff Oral Cavity - Dentition: Poor condition;Missing dentition Vision: Impaired for self-feeding (legally blind) Self-Feeding Abilities: Total assist Patient Positioning: Upright in bed Baseline Vocal Quality: Low vocal intensity (mumbled/muttered speech) Volitional Cough: Cognitively unable to elicit Volitional Swallow: Unable to elicit    Oral/Motor/Sensory Function Overall Oral Motor/Sensory Function: Generalized oral weakness (difficult to assess d/t Cognitive decline)   Ice Chips Ice chips: Not tested   Thin Liquid Thin Liquid: Not tested    Nectar Thick Nectar Thick Liquid: Impaired Presentation: Spoon (fed; 8 trials) Oral Phase Impairments: Reduced labial seal;Poor awareness of bolus;Reduced lingual movement/coordination Oral phase functional implications: Prolonged oral transit (slow transit w/ Nectar) Pharyngeal Phase Impairments: Suspected delayed Swallow (none)   Honey Thick Honey Thick Liquid: Not tested   Puree Puree: Impaired Presentation: Spoon (fed; 4 trials) Oral Phase Impairments: Reduced labial seal;Impaired mastication;Poor awareness of bolus Oral Phase Functional Implications: Prolonged oral transit (increased mastication of purees) Pharyngeal Phase Impairments: Suspected delayed Swallow Other Comments: did not  readily open mouth to receive boluses   Solid   GO   Solid: Not tested       Jerilynn SomKatherine Watson, MS, CCC-SLP  Watson,Katherine 07/12/2015,3:32 PM

## 2015-07-12 NOTE — Clinical Documentation Improvement (Signed)
Internal Medicine  Can the diagnosis of CKD be further specified? Please document findings in next progress note. Thank you!   CKD Stage I - GFR greater than or equal to 90  CKD Stage II - GFR 60-89  CKD Stage III - GFR 30-59  CKD Stage IV - GFR 15-29  CKD Stage V - GFR < 15  ESRD (End Stage Renal Disease)  Other condition  Unable to clinically determine  Supporting Information: : (risk factors, signs and symptoms, diagnostics, treatment)  Black female  GFR's running for current admission are 38 to 31  Please exercise your independent, professional judgment when responding. A specific answer is not anticipated or expected.  Thank You, Shellee MiloEileen T Jess Toney RN, BSN, CCDS Health Information Management [place] [phone]; Cell: [phone]

## 2015-07-12 NOTE — Progress Notes (Signed)
Pharmacy Antibiotic Note  Jasmine Briggs is a 82 y.o. female admitted on 07/10/2015 with pneumonia.  Pharmacy has been consulted for vancomycin and Zosyn dosing.  Patient was recently admitted and discharged on 4/5 with a PICC line on antibiotics for sacral decub and possible OM, UTI, and pneumonia. Patient was receiving vancomycin 500 mg IV daily PTA. Patient is currently on day #3 of antibiotics during admission but was receiving vancomycin PTA. Unclear how long patient has been receiving IV antibiotics. Discussed with MD and ID consult has been placed.   Plan: Continue vancomycin 500 mg IV daily (PTA dose). SCr has increased from 1.35 on admission to 1.59. Patient was initially started on vancomycin 1000 mg IV daily on admission (and received initial and stacked dose). Dose was changed to 500 mg IV daily on 4/20. Will order vancomycin level prior to tomorrow mornings dose - this will not represent steady state for this dose but want to ensure patient is not supratherapeutic given changes in renal function.  Continue piperacillin/tazobactam to 3.375 g IV q8h EI  Height: 5\' 7"  (170.2 cm) Weight: 185 lb 9.6 oz (84.188 kg) IBW/kg (Calculated) : 61.6  Temp (24hrs), Avg:97.7 F (36.5 C), Min:96.7 F (35.9 C), Max:98.3 F (36.8 C)   Recent Labs Lab 07/10/15 1821 07/10/15 1822 07/11/15 0454 07/11/15 0540 07/12/15 0644  WBC 4.5  --   --  4.9 9.2  CREATININE 1.35*  --  1.39*  --  1.59*  LATICACIDVEN  --  1.4  --   --   --     Estimated Creatinine Clearance: 25.2 mL/min (by C-G formula based on Cr of 1.59).    No Known Allergies  Antimicrobials this admission: Vancomycin 4/19 >> Piperacillin/tazobactam 4/19 >>   Dose adjustments this admission: Patient was started on vancomycin 1000 mg daily on admission and receieved 2 doses (initial and stacked). Changed back to 500 mg daily PTA dose on 4/20.  Microbiology results: 4/19 BCx: No growth 2 days 4/19 UCx: No growth 24 hours  Thank you  for allowing pharmacy to be a part of this patient's care.  Cindi CarbonMary M Donnabelle Blanchard, PharmD Clinical Pharmacist 07/12/2015 9:47 AM

## 2015-07-12 NOTE — Care Management (Signed)
Open to [facility] for SN, PT ans SW.

## 2015-07-12 NOTE — Progress Notes (Signed)
Initial Nutrition Assessment      INTERVENTION:  Monitor diet progression and poc   NUTRITION DIAGNOSIS:   Increased nutrient needs related to wound healing as evidenced by estimated needs.    GOAL:   Patient will meet greater than or equal to 90% of their needs    MONITOR:   PO intake, Diet advancement  REASON FOR ASSESSMENT:   Malnutrition Screening Tool    ASSESSMENT:     Past Medical History  Diagnosis Date  . Dementia   . Osteomyelitis (HCC)     sacral wound  . Hypertension   . Arthritis   . Diabetes mellitus without complication (HCC)   . Gout    Pt currently NPO.    Family meeting with Palliative care at this time, pt blind and with dementia, unable to answer questions  Medications reviewed Labs reviewed glucose 117, BUN 36, creatinine 1.59   Unable to complete Nutrition-Focused physical exam at this time.     Diet Order:  Diet NPO time specified  Skin:   (stage III pressure ulcer noted on coccyx)  Last BM:  4/20  Height:   Ht Readings from Last 1 Encounters:  07/10/15 5\' 7"  (1.702 m)    Weight: noted wt gain per wt encounters  Wt Readings from Last 1 Encounters:  07/10/15 185 lb 9.6 oz (84.188 kg)    Ideal Body Weight:     BMI:  Body mass index is 29.06 kg/(m^2).  Estimated Nutritional Needs:   Kcal:  2100 kcals/d  Protein:  100-126 g/d  Fluid:  2 L/d  EDUCATION NEEDS:   No education needs identified at this time  Khyren Hing B. Freida BusmanAllen, RD, LDN [phone] (pager) Weekend/On-Call pager [phone])

## 2015-07-12 NOTE — Care Management Important Message (Signed)
Important Message  Patient Details  Name: Jasmine Briggs MRN: [id number] Date of Birth: [birth year]   Medicare Important Message Given:  Yes    Olegario MessierKathy A Viviane Semidey 07/12/2015, 10:54 AM

## 2015-07-12 NOTE — Consult Note (Signed)
Palliative Medicine Inpatient Consult Note   Name: Jasmine Briggs Date: 07/12/2015 MRN: [id number]  DOB: 02-28-1921  Referring Physician: Shaune PollackQing Chen, MD  Palliative Care consult requested for this 82 y.o. female for goals of medical therapy in patient with   TODAY'S DISCUSSIONS AND DECISIONS: Pt continues to be DNR.    I recall pt and family quite vividly from hospital stay 2 weeks ago. They were able to consent to a DNR then after many hours of discussions --but they did not want anything but ongoing aggressive care in the patient's home setting.  Pt is blind and gives the impression of not being able to talk. But, one things she can and does say often is that she 'wants to go home!'.  Family is doing their best in honoring this statement their mother makes repeatedly.  Unfortunately, their ongoing desire for aggressive care is making it so the patient actually gets to live --not at home as she would want ---but in the hospital, constantly and recurrently.    I saw Jasmine Briggs, Jasmine Briggs, and Jasmine Briggs in the room.  Jasmine Briggs is the oldest son and had gone home.  Jasmine Briggs gives pt the IV Vanco daily at home and the IM Invanz at home.  Jasmine Briggs lives with pt and does all her personal care. Jasmine Briggs is the one who refuses to participate in ANY discussions about her mother's illness (last time and this time) so she stayed in the pts room as I took Jasmine Briggs and Jasmine InchesLindelle out to the family room to talk.  Jasmine Briggs herself is over 70 and uses a cane or wheelchair and has had a stroke in the past. She spent at least 30 minutes of nonstop complaining (talking over me and not stopping till toward the end of our meeting together). She HATES IT when people keep mentioning her mother's age.  She also complained that everyone says 'something different'.  She complained that she had NEVER heard that her mother's kidneys were not working well. (She has been told --by ME --last admission).  And she complained that NO ONE has EVER told her  that her mother has swallowing trouble.  (Again --I went into extensive detail about this with her and with the others last admission).  I must conclude that Jasmine Briggs herself has some memory deficits and some problems with rational thoughts as she is overly angry about people mentioning her mother's age (as it that were a completely irrelevant issue --which it is not).  Jasmine Briggs was quiet but seemed to nod that he understood what I was saying --or rather trying to say while Jasmine Briggs talked over me and was venting about her complaints.    I was only able to talk a little bit about comfort care.  Jasmine Briggs finally quit talking nonstop about things she was upset about and I was able to bring up Hospice and the benefits.  Jasmine Briggs is convinced that Hospice killed someone she knows by burning a hole in that patient's tongue with morphine. And she has other irrational fears and stories that I was not able to address because she is not willing to learn or listen on this subject matter.    I was only able to recommend that SLP see pt again.  Pt is too lethargic today to be fed, though she is starting to wake up. She is blind, so we should not assume she is asleep (she keeps her eyes closed 90% of the time per family).  I cannot call each relative and there  is not a point as Jasmine Lias won't weigh in on decisions, Jasmine Briggs is too quiet and passive to make a decision, and Jasmine Briggs is against any kind of comfort plan. The other three children would not make a majority.    I did talk about the problem with feeding pt by vein, by NG, or by PEG. They really just want her to wake up and eat.  I have spoken with SLP who will work with pt now to see if she can again tolerate a dysphagia diet (though we know pt has recurrent aspiration episodes).  I updated Jasmine Briggs.  Aggressive care to continue for now.   ----------------------------------------------------------------------------  CLINICAL NARRATIVE: Pt is a 82 yo woman known  to me from her previous admission from 3/31 - 4/5.  Pt had come in then with aspiration pneumonia, a yeast UTI, and osteomyelitis of her sacrum which is related to a nonhealable large sacrel decubitus.  Family had wanted to continue with home health and pt was sent home on 4/5 with orders for IV Vancomycin and IM Invanz via a PICC line for 6 weeks.  One of her sons was administering themeds.  She had been doing reasonably well until the day prior to admission when she became less talkative. Then when she became quite confused and hard to waken, he notices she had a lot of swelling all over her body.  Pt was found to be hypthermic and diffusely edematous in the ED.  She was diagnosed with acute CHF and anasarca and she was started on a Lasix drip.  She was given albumin IV.  She had ronchorous lung sounds and aspiration pneumonia was also again diagnosed.  AbX were changed to Zosyn plus Vanco.  She was hypothermic and was placed on a bear hugger at 1 am this morning.   She had an echo this am that showed an EF of 45-50% and moderate TR. Cardiology has seen pt and notes pts poor prognosis. Her temperature is coming up somewhat. But she is not able to eat due to altered mentation and her overall multi-organ system problems give her a very poor prognosis.    The last time she was hospitalized, I spent many hours meeting with family and talking with them by phone.  Four sons and two daughters all need to be on the same page. The eldest son, Jasmine Briggs, feels like he is the main one we should talk with, based on statements he made to me.  And the eldest daughter, Jasmine Briggs, makes strong statements about continuing aggressive care and is so outspoken that opinions of others gets a bit suppressed.  The son who actually lives with pt and does the day to day physical care, Jasmine Briggs, is actually soft spoken and quiet much of the time during our talks.  Family took many hours to come to a DNR decision last time. I did not get  the impression then that they would be inclined to want hospice though I brought it up then in a very positive manner.    The legality of this situation is that the MAJORITY of ADULT CHILDREN would be decision makers in this situation. The patient is SICKER this time than the last.      IMPRESSION: Sepsis with hypothermia and hypotension Acute systolic and diastolic CHF Possible pneumonia Bilateral Pleural Effusions Metabolic Encephalopathy Dementia SHE IS BLIND Osteomyelitis of sacrum Sacral wound stage 3 as currently assessed --2 by 0.2 by 0.2 cm  --unhealable due to severe malnutrition  and apparent CKD (unclear stage?) --was to have 6 wks of IV Vanco and IM Invanz 1 g nightly for 6 weeks --when discharged on 06/26/15 --hospitalized here three times since DC [facility] Recent Sepsis --due to aspiration pneumonia Recent Pneumonis --aspiration Dysphagia --on Dys 1 diet supposedly Recent acute resp flre with hypoxia due to aspiration pneumonia Esstl HTN H/O Gout and DJD H/O DVT upper arm on Eliquis ARF on chronic kidney disease (stage 4) ---Cr was As high as 1.43 last adm then  1.16 when last DCd and now at 1.59 Anemia of chronic Disease  ---transfused this adm due to Hgb dropping from 8.1 to 7.0.  Severe malnutrition --Albumin of 2.7 this adm (was 2.3 last adm) Hematuria (this admission and last) Yeast UTI last admission VVI Pacemaker placement in past ---followed at [facility] Cardiomyopathy with EF 45-50% Tricuspid Regurgitation Mild to Mod Mitral Regurgitation Chronically dislocated right shoulder ---conservative tx with passive ROM  S/p Cardiac surgery (probably CABG but not known ---evidence of median sternotomy on imaging  REVIEW OF SYSTEMS:  Patient is not able to provide ROS due to illness and confusion.  SPIRITUAL SUPPORT SYSTEM: Yes.  SOCIAL HISTORY:  reports that she has never smoked. She has quit using smokeless tobacco. Her smokeless tobacco use  included Snuff. She reports that she does not drink alcohol or use illicit drugs.  LEGAL DOCUMENTS:  The portable DNR form that I had completed on 06/23/15 is seen to be in the paper chart.   CODE STATUS: DNR  PAST MEDICAL HISTORY: Past Medical History  Diagnosis Date  . Dementia   . Osteomyelitis (HCC)     sacral wound  . Hypertension   . Arthritis   . Diabetes mellitus without complication (HCC)   . Gout     PAST SURGICAL HISTORY:  Past Surgical History  Procedure Laterality Date  . Picc line placement    . Cardiac surgery    . Pacemaker insertion      ALLERGIES:  has No Known Allergies.  MEDICATIONS:  Current Facility-Administered Medications  Medication Dose Route Frequency Provider Last Rate Last Dose  . enoxaparin (LOVENOX) injection 30 mg  30 mg Subcutaneous Q24H Shaune Pollack, MD      . ondansetron [facility]) tablet 4 mg  4 mg Oral Q6H PRN Oralia Manis, MD       Or  . ondansetron [facility]) injection 4 mg  4 mg Intravenous Q6H PRN Oralia Manis, MD      . piperacillin-tazobactam (ZOSYN) IVPB 3.375 g  3.375 g Intravenous [address] [place], RPH   3.375 g at 07/12/15 0920  . sodium chloride flush (NS) 0.9 % injection 3 mL  3 mL Intravenous Q12H Oralia Manis, MD   3 mL at 07/11/15 0900  . vancomycin (VANCOCIN) 500 mg in sodium chloride 0.9 % 100 mL IVPB  500 mg Intravenous Q24H Cindi Carbon, RPH   500 mg at 07/12/15 1610    Vital Signs: BP 117/70 mmHg  Pulse 70  Temp(Src) 97.5 F (36.4 C) (Oral)  Resp 20  Ht  (1.702 m)  Wt 84.188 kg (185 lb 9.6 oz)  BMI 29.06 kg/m2  SpO2 98% Filed Weights   07/10/15 1757 07/10/15 2305  Weight: 90.719 kg (200 lb) 84.188 kg (185 lb 9.6 oz)    Estimated body mass index is 29.06 kg/(m^2) as calculated from the following:   Height as of this encounter:  (1.702 m).   Weight as of this encounter:  84.188 kg (185 lb 9.6 oz).  PERFORMANCE STATUS (ECOG) : 4 - Bedbound  PHYSICAL EXAM: Lying in bed with eyes partially open.  She  mumbles a few words which family seemed to understand  She is blind bilaterally Hrt rrr no m Lungs with ronchi and rales in bases Abd soft and Nt Ext no mottling or cyanosis 3plus edema noted diffusely   LABS: CBC:    Component Value Date/Time   WBC 9.2 07/12/2015 0644   WBC 6.9 10/31/2013 1709   HGB 9.7* 07/12/2015 0644   HGB 12.5 10/31/2013 1709   HCT 29.3* 07/12/2015 0644   HCT 38.9 10/31/2013 1709   PLT 172 07/12/2015 0644   PLT 237 10/31/2013 1709   MCV 85.8 07/12/2015 0644   MCV 94 10/31/2013 1709   NEUTROABS 3.3 07/10/2015 1821   LYMPHSABS 0.4* 07/10/2015 1821   MONOABS 0.7 07/10/2015 1821   EOSABS 0.0 07/10/2015 1821   BASOSABS 0.0 07/10/2015 1821   Comprehensive Metabolic Panel:    Component Value Date/Time   NA 148* 07/12/2015 0644   NA 138 10/31/2013 1819   K 3.6 07/12/2015 0644   K 4.0 10/31/2013 1819   CL 119* 07/12/2015 0644   CL 109* 10/31/2013 1819   CO2 20* 07/12/2015 0644   CO2 21 10/31/2013 1819   BUN 32* 07/12/2015 0644   BUN 24* 10/31/2013 1819   CREATININE 1.59* 07/12/2015 0644   CREATININE 1.25 10/31/2013 1819   GLUCOSE 117* 07/12/2015 0644   GLUCOSE 152* 10/31/2013 1819   CALCIUM 9.5 07/12/2015 0644   CALCIUM 9.6 10/31/2013 1819   AST 33 07/10/2015 1821   AST 20 10/31/2013 1819   ALT 20 07/10/2015 1821   ALT 30 10/31/2013 1819   ALKPHOS 78 07/10/2015 1821   ALKPHOS 54 10/31/2013 1819   BILITOT 0.4 07/10/2015 1821   BILITOT 0.4 10/31/2013 1819   PROT 6.6 07/10/2015 1821   PROT 6.9 10/31/2013 1819   ALBUMIN 2.7* 07/10/2015 1821   ALBUMIN 3.0* 10/31/2013 1819   CXR 07/10/2015: Increase/progressive right pleural effusion that appears circumferential. Hazy opacity at the left lung base suspicious for pleural effusion. Bibasilar opacities. Cardiomegaly is unchanged. Patient is post median sternotomy. There is a left central line with tip in the atrial caval junction. Right perihilar and suprahilar opacities have increased. Question  developing vascular congestion. Right-sided pacemaker remains in place. Chronic deformity of the right shoulder with dislocation.  IMPRESSION: 1. Increased bilateral pleural effusions, right greater than left. Probable vascular congestion. Question fluid overload/CHF. 2. Increasing right perihilar and suprahilar opacities, may be vascular, atelectasis or pneumonia. Underlying pulmonary mass or right paratracheal adenopathy is difficult to exclude on this portable exam.    ECHO 07/11/15 -Left ventricle: Wall thickness was increased in a pattern of mild  LVH. Systolic function was mildly reduced. The estimated ejection  fraction was in the range of 45% to 50%. - Aortic valve: There was mild regurgitation. Valve area (Vmax):  2.04 cm^2. - Mitral valve: There was mild to moderate regurgitation. - Left atrium: The atrium was mildly dilated. - Right ventricle: The cavity size was mildly dilated. Systolic  pressure was increased. - Right atrium: The atrium was mildly dilated. - Tricuspid valve: There was moderate regurgitation.   More than 50% of the visit was spent in counseling/coordination of care: Yes  Time Spent: 80 minutes

## 2015-07-12 NOTE — Plan of Care (Signed)
Problem: SLP Dysphagia Goals Goal: Misc Dysphagia Goal Pt will safely tolerate po diet of least restrictive consistency w/ no overt s/s of aspiration noted by Staff/pt/family x3 sessions.    

## 2015-07-12 NOTE — Consult Note (Signed)
[facility] Infectious Disease     Reason for Consult: AMS, sepsis    Referring Physician: Estanislado Spire Date of Admission:  07/10/2015   Principal Problem:   Acute CHF (congestive heart failure) (HCC) Active Problems:   Pressure ulcer   Aspiration pneumonia (HCC)   Dementia   HTN (hypertension)   Anasarca   Type 2 diabetes mellitus (HCC)   HPI: Jasmine Briggs is a 82 y.o. female admitted 4/19 with increasing edema and AMS>  [facility] had recent admissions and was following with [facility] ID and was recently on IV abx - vanco and ertapenem.. She had also had recent removal of her foley cath. On admission wbc was 4.9 and temp was 97. BCX neg, ucx with yeast.  UA showed TNTC WBC. CXR with increasing pleural effusions and possible infiltrate. She has been on vanco and zosyn since admit. Has been on lasix drip.   Past Medical History  Diagnosis Date  . Dementia   . Osteomyelitis ([place])     sacral wound  . Hypertension   . Arthritis   . Diabetes mellitus without complication ([place])   . Gout    Past Surgical History  Procedure Laterality Date  . Picc line placement    . Cardiac surgery    . Pacemaker insertion     Social History  Substance Use Topics  . Smoking status: Never Smoker   . Smokeless tobacco: Former Systems developer    Types: Snuff  . Alcohol Use: No   Family History  Problem Relation Age of Onset  . Diabetes Maternal Aunt     Allergies: No Known Allergies  Current antibiotics: Antibiotics Given (last 72 hours)    Date/Time Action Medication Dose Rate   07/11/15 0159 Given   piperacillin-tazobactam (ZOSYN) IVPB 4.5 g 4.5 g 25 mL/hr   07/11/15 0855 Given   vancomycin (VANCOCIN) IVPB 1000 mg/200 mL premix 1,000 mg 200 mL/hr   07/11/15 1720 Given   piperacillin-tazobactam (ZOSYN) IVPB 3.375 g 3.375 g 12.5 mL/hr   07/12/15 0056 Given   piperacillin-tazobactam (ZOSYN) IVPB 3.375 g 3.375 g 12.5 mL/hr   07/12/15 0752 Given   vancomycin (VANCOCIN) 500 mg in sodium chloride 0.9 % 100 mL IVPB  500 mg 100 mL/hr   07/12/15 0920 Given   piperacillin-tazobactam (ZOSYN) IVPB 3.375 g 3.375 g 12.5 mL/hr      MEDICATIONS: . enoxaparin (LOVENOX) injection  30 mg Subcutaneous Q24H  . piperacillin-tazobactam (ZOSYN)  IV  3.375 g Intravenous Q8H  . sodium chloride flush  3 mL Intravenous Q12H  . vancomycin  500 mg Intravenous Q24H    Review of Systems - UNABLE to obtain OBJECTIVE: Temp:  [97.3 F (36.3 C)-98.3 F (36.8 C)] 97.5 F (36.4 C) (04/21 0409) Pulse Rate:  [70] 70 (04/21 0409) Resp:  [17-20] 20 (04/21 0409) BP: (111-134)/(57-73) 117/70 mmHg (04/21 0409) SpO2:  [98 %-100 %] 98 % (04/21 0409) Physical Exam  Constitutional:  Confused, lying in bed, being spoon fed, blind HENT: [place]/AT,  no scleral icterus Mouth/Throat: Oropharynx is clear and moist.  Cardiovascular: Normal rate, regular rhythm and normal heart sounds.  Pulmonary/Chest: Effort normal and breath sounds normal. No respiratory distress.  has no wheezes.  L chest wall with central line wnl Neck  supple, no nuchal rigidity Abdominal: Soft. Bowel sounds are normal.  exhibits no distension. There is no tenderness.  Lymphadenopathy: no cervical adenopathy. No axillary adenopathy Neurological: awake, not really responsive to questioning or following commands GU chronic foley  Ext anasarca  with 3+ arm edema bil Skin: Skin is warm and dry. No rash noted. No erythema.  Psychiatric: confused   LABS: Results for orders placed or performed during the hospital encounter of 07/10/15 (from the past 48 hour(s))  CBC with Differential/Platelet     Status: Abnormal   Collection Time: 07/10/15  6:21 PM  Result Value Ref Range   WBC 4.5 3.6 - 11.0 K/uL   RBC 2.85 (L) 3.80 - 5.20 MIL/uL   Hemoglobin 8.1 (L) 12.0 - 16.0 g/dL   HCT 25.5 (L) 35.0 - 47.0 %   MCV 89.5 80.0 - 100.0 fL   MCH 28.4 26.0 - 34.0 pg   MCHC 31.7 (L) 32.0 - 36.0 g/dL   RDW 22.3 (H) 11.5 - 14.5 %   Platelets 175 150 - 440 K/uL   Neutrophils Relative  % 74 %   Neutro Abs 3.3 1.4 - 6.5 K/uL   Lymphocytes Relative 10 %   Lymphs Abs 0.4 (L) 1.0 - 3.6 K/uL   Monocytes Relative 15 %   Monocytes Absolute 0.7 0.2 - 0.9 K/uL   Eosinophils Relative 0 %   Eosinophils Absolute 0.0 0 - 0.7 K/uL   Basophils Relative 1 %   Basophils Absolute 0.0 0 - 0.1 K/uL  Comprehensive metabolic panel     Status: Abnormal   Collection Time: 07/10/15  6:21 PM  Result Value Ref Range   Sodium 146 (H) 135 - 145 mmol/L   Potassium 4.1 3.5 - 5.1 mmol/L   Chloride 117 (H) 101 - 111 mmol/L   CO2 18 (L) 22 - 32 mmol/L   Glucose, Bld 77 65 - 99 mg/dL   BUN 30 (H) 6 - 20 mg/dL   Creatinine, Ser 1.35 (H) 0.44 - 1.00 mg/dL   Calcium 9.4 8.9 - 10.3 mg/dL   Total Protein 6.6 6.5 - 8.1 g/dL   Albumin 2.7 (L) 3.5 - 5.0 g/dL   AST 33 15 - 41 U/L   ALT 20 14 - 54 U/L   Alkaline Phosphatase 78 38 - 126 U/L   Total Bilirubin 0.4 0.3 - 1.2 mg/dL   GFR calc non Af Amer 33 (L) >60 mL/min   GFR calc Af Amer 38 (L) >60 mL/min    Comment: (NOTE) The eGFR has been calculated using the CKD EPI equation. This calculation has not been validated in all clinical situations. eGFR's persistently <60 mL/min signify possible Chronic Kidney Disease.    Anion gap 11 5 - 15  Troponin I     Status: None   Collection Time: 07/10/15  6:21 PM  Result Value Ref Range   Troponin I <0.03 <0.031 ng/mL    Comment:        NO INDICATION OF MYOCARDIAL INJURY.   Protime-INR     Status: Abnormal   Collection Time: 07/10/15  6:21 PM  Result Value Ref Range   Prothrombin Time 17.3 (H) 11.4 - 15.0 seconds   INR 1.40   Brain natriuretic peptide     Status: Abnormal   Collection Time: 07/10/15  6:21 PM  Result Value Ref Range   B Natriuretic Peptide 267.0 (H) 0.0 - 100.0 pg/mL  Lactic acid, plasma     Status: None   Collection Time: 07/10/15  6:22 PM  Result Value Ref Range   Lactic Acid, Venous 1.4 0.5 - 2.0 mmol/L  Blood culture (routine x 2)     Status: None (Preliminary result)    Collection Time: 07/10/15  6:23 PM  Result Value Ref Range   Specimen Description BLOOD PICC LINE    Special Requests BOTTLES DRAWN AEROBIC AND ANAEROBIC  8CC    Culture NO GROWTH 2 DAYS    Report Status PENDING   Blood culture (routine x 2)     Status: None (Preliminary result)   Collection Time: 07/10/15  7:00 PM  Result Value Ref Range   Specimen Description BLOOD PICC LINE    Special Requests BOTTLES DRAWN AEROBIC AND ANAEROBIC  5CC    Culture NO GROWTH 2 DAYS    Report Status PENDING   Urinalysis complete, with microscopic ([facility] only)     Status: Abnormal   Collection Time: 07/10/15  8:04 PM  Result Value Ref Range   Color, Urine AMBER (A) YELLOW   APPearance TURBID (A) CLEAR   Glucose, UA 50 (A) NEGATIVE mg/dL   Bilirubin Urine NEGATIVE NEGATIVE   Ketones, ur NEGATIVE NEGATIVE mg/dL   Specific Gravity, Urine 1.018 1.005 - 1.030   Hgb urine dipstick 2+ (A) NEGATIVE   pH 5.0 5.0 - 8.0   Protein, ur 100 (A) NEGATIVE mg/dL   Nitrite NEGATIVE NEGATIVE   Leukocytes, UA 1+ (A) NEGATIVE   RBC / HPF TOO NUMEROUS TO COUNT 0 - 5 RBC/hpf   WBC, UA TOO NUMEROUS TO COUNT 0 - 5 WBC/hpf   Bacteria, UA RARE (A) NONE SEEN   Squamous Epithelial / LPF 0-5 (A) NONE SEEN   WBC Clumps PRESENT    Mucous PRESENT    Budding Yeast PRESENT   Urine culture     Status: Abnormal (Preliminary result)   Collection Time: 07/10/15  8:04 PM  Result Value Ref Range   Specimen Description URINE, RANDOM    Special Requests NONE    Culture (A)     50,000 COLONIES/mL YEAST IDENTIFICATION TO FOLLOW    Report Status PENDING   Glucose, capillary     Status: None   Collection Time: 07/11/15  1:21 AM  Result Value Ref Range   Glucose-Capillary 73 65 - 99 mg/dL   Comment 1 Notify RN   Basic metabolic panel     Status: Abnormal   Collection Time: 07/11/15  4:54 AM  Result Value Ref Range   Sodium 147 (H) 135 - 145 mmol/L   Potassium 3.8 3.5 - 5.1 mmol/L   Chloride 120 (H) 101 - 111 mmol/L   CO2 17 (L)  22 - 32 mmol/L   Glucose, Bld 95 65 - 99 mg/dL   BUN 31 (H) 6 - 20 mg/dL   Creatinine, Ser 1.39 (H) 0.44 - 1.00 mg/dL   Calcium 9.1 8.9 - 10.3 mg/dL   GFR calc non Af Amer 32 (L) >60 mL/min   GFR calc Af Amer 37 (L) >60 mL/min    Comment: (NOTE) The eGFR has been calculated using the CKD EPI equation. This calculation has not been validated in all clinical situations. eGFR's persistently <60 mL/min signify possible Chronic Kidney Disease.    Anion gap 10 5 - 15  CBC     Status: Abnormal   Collection Time: 07/11/15  5:40 AM  Result Value Ref Range   WBC 4.9 3.6 - 11.0 K/uL   RBC 2.49 (L) 3.80 - 5.20 MIL/uL   Hemoglobin 7.0 (L) 12.0 - 16.0 g/dL   HCT 21.6 (L) 35.0 - 47.0 %   MCV 87.0 80.0 - 100.0 fL   MCH 28.2 26.0 - 34.0 pg   MCHC 32.4 32.0 - 36.0 g/dL   RDW  21.6 (H) 11.5 - 14.5 %   Platelets 145 (L) 150 - 440 K/uL  Glucose, capillary     Status: None   Collection Time: 07/11/15  6:15 AM  Result Value Ref Range   Glucose-Capillary 81 65 - 99 mg/dL  Glucose, capillary     Status: None   Collection Time: 07/11/15  7:58 AM  Result Value Ref Range   Glucose-Capillary 83 65 - 99 mg/dL  Glucose, capillary     Status: None   Collection Time: 07/11/15 11:39 AM  Result Value Ref Range   Glucose-Capillary 82 65 - 99 mg/dL  ABO/Rh     Status: None   Collection Time: 07/11/15 12:55 PM  Result Value Ref Range   ABO/RH(D) O POS   Type and screen [facility]     Status: None   Collection Time: 07/11/15  1:00 PM  Result Value Ref Range   ABO/RH(D) O POS    Antibody Screen NEG    Sample Expiration 07/14/2015    Unit Number [id number]    Blood Component Type RED CELLS,LR    Unit division 00    Status of Unit ISSUED,FINAL    Transfusion Status OK TO TRANSFUSE    Crossmatch Result Compatible   Prepare RBC     Status: None   Collection Time: 07/11/15  1:00 PM  Result Value Ref Range   Order Confirmation ORDER PROCESSED BY BLOOD BANK   Magnesium     Status:  Abnormal   Collection Time: 07/11/15  1:00 PM  Result Value Ref Range   Magnesium 2.5 (H) 1.7 - 2.4 mg/dL  Glucose, capillary     Status: None   Collection Time: 07/11/15  5:45 PM  Result Value Ref Range   Glucose-Capillary 68 65 - 99 mg/dL  Glucose, capillary     Status: Abnormal   Collection Time: 07/11/15  6:39 PM  Result Value Ref Range   Glucose-Capillary 143 (H) 65 - 99 mg/dL  Glucose, capillary     Status: Abnormal   Collection Time: 07/12/15  6:03 AM  Result Value Ref Range   Glucose-Capillary 100 (H) 65 - 99 mg/dL  Basic metabolic panel     Status: Abnormal   Collection Time: 07/12/15  6:44 AM  Result Value Ref Range   Sodium 148 (H) 135 - 145 mmol/L   Potassium 3.6 3.5 - 5.1 mmol/L   Chloride 119 (H) 101 - 111 mmol/L   CO2 20 (L) 22 - 32 mmol/L   Glucose, Bld 117 (H) 65 - 99 mg/dL   BUN 32 (H) 6 - 20 mg/dL   Creatinine, Ser 1.59 (H) 0.44 - 1.00 mg/dL   Calcium 9.5 8.9 - 10.3 mg/dL   GFR calc non Af Amer 27 (L) >60 mL/min   GFR calc Af Amer 31 (L) >60 mL/min    Comment: (NOTE) The eGFR has been calculated using the CKD EPI equation. This calculation has not been validated in all clinical situations. eGFR's persistently <60 mL/min signify possible Chronic Kidney Disease.    Anion gap 9 5 - 15  CBC     Status: Abnormal   Collection Time: 07/12/15  6:44 AM  Result Value Ref Range   WBC 9.2 3.6 - 11.0 K/uL   RBC 3.41 (L) 3.80 - 5.20 MIL/uL   Hemoglobin 9.7 (L) 12.0 - 16.0 g/dL   HCT 29.3 (L) 35.0 - 47.0 %   MCV 85.8 80.0 - 100.0 fL   MCH 28.3 26.0 -  34.0 pg   MCHC 33.0 32.0 - 36.0 g/dL   RDW 20.3 (H) 11.5 - 14.5 %   Platelets 172 150 - 440 K/uL  Glucose, capillary     Status: None   Collection Time: 07/12/15 11:58 AM  Result Value Ref Range   Glucose-Capillary 99 65 - 99 mg/dL   No components found for: ESR, C REACTIVE PROTEIN MICRO: Recent Results (from the past 720 hour(s))  Wound culture     Status: None   Collection Time: 06/12/15  5:34 PM  Result  Value Ref Range Status   Specimen Description ULCER  Final   Special Requests NONE  Final   Gram Stain RARE WBC SEEN RARE GRAM NEGATIVE RODS   Final   Culture   Final    LIGHT GROWTH ESCHERICHIA COLI LIGHT GROWTH PSEUDOMONAS FLUORESCENS LIGHT GROWTH PROTEUS MIRABILIS    Report Status 06/17/2015 FINAL  Final   Organism ID, Bacteria ESCHERICHIA COLI  Final   Organism ID, Bacteria PSEUDOMONAS FLUORESCENS  Final   Organism ID, Bacteria PROTEUS MIRABILIS  Final      Susceptibility   Escherichia coli - MIC*    AMPICILLIN 8 SENSITIVE Sensitive     CEFAZOLIN <=4 SENSITIVE Sensitive     CEFEPIME <=1 SENSITIVE Sensitive     CEFTAZIDIME <=1 SENSITIVE Sensitive     CEFTRIAXONE <=1 SENSITIVE Sensitive     CIPROFLOXACIN <=0.25 SENSITIVE Sensitive     GENTAMICIN <=1 SENSITIVE Sensitive     IMIPENEM <=0.25 SENSITIVE Sensitive     TRIMETH/SULFA <=20 SENSITIVE Sensitive     AMPICILLIN/SULBACTAM 4 SENSITIVE Sensitive     PIP/TAZO <=4 SENSITIVE Sensitive     Extended ESBL NEGATIVE Sensitive     * LIGHT GROWTH ESCHERICHIA COLI   Proteus mirabilis - MIC*    AMPICILLIN <=2 SENSITIVE Sensitive     CEFAZOLIN <=4 SENSITIVE Sensitive     CEFEPIME <=1 SENSITIVE Sensitive     CEFTAZIDIME <=1 SENSITIVE Sensitive     CEFTRIAXONE <=1 SENSITIVE Sensitive     CIPROFLOXACIN <=0.25 SENSITIVE Sensitive     GENTAMICIN <=1 SENSITIVE Sensitive     IMIPENEM 4 SENSITIVE Sensitive     TRIMETH/SULFA <=20 SENSITIVE Sensitive     AMPICILLIN/SULBACTAM <=2 SENSITIVE Sensitive     PIP/TAZO <=4 SENSITIVE Sensitive     * LIGHT GROWTH PROTEUS MIRABILIS   Pseudomonas fluorescens - MIC*    CEFTAZIDIME 4 SENSITIVE Sensitive     CIPROFLOXACIN <=0.25 SENSITIVE Sensitive     GENTAMICIN <=1 SENSITIVE Sensitive     IMIPENEM <=0.25 SENSITIVE Sensitive     PIP/TAZO 8 SENSITIVE Sensitive     * LIGHT GROWTH PSEUDOMONAS FLUORESCENS  Urine culture     Status: None   Collection Time: 06/21/15  3:15 PM  Result Value Ref Range  Status   Specimen Description URINE, RANDOM  Final   Special Requests NONE  Final   Culture >=100,000 COLONIES/mL CANDIDA ALBICANS  Final   Report Status 06/24/2015 FINAL  Final  Culture, blood (routine x 2)     Status: None   Collection Time: 06/21/15  6:54 PM  Result Value Ref Range Status   Specimen Description BLOOD LEFT ASSIST CONTROL  Final   Special Requests BOTTLES DRAWN AEROBIC AND ANAEROBIC [place]  Final   Culture NO GROWTH 5 DAYS  Final   Report Status 06/26/2015 FINAL  Final  Culture, blood (routine x 2)     Status: None   Collection Time: 06/21/15  6:54 PM  Result Value Ref  Range Status   Specimen Description BLOOD RIGHT ASSIST CONTROL  Final   Special Requests BOTTLES DRAWN AEROBIC AND ANAEROBIC [place]  Final   Culture NO GROWTH 5 DAYS  Final   Report Status 06/26/2015 FINAL  Final  Blood culture (routine x 2)     Status: None (Preliminary result)   Collection Time: 07/10/15  6:23 PM  Result Value Ref Range Status   Specimen Description BLOOD PICC LINE  Final   Special Requests BOTTLES DRAWN AEROBIC AND ANAEROBIC  8CC  Final   Culture NO GROWTH 2 DAYS  Final   Report Status PENDING  Incomplete  Blood culture (routine x 2)     Status: None (Preliminary result)   Collection Time: 07/10/15  7:00 PM  Result Value Ref Range Status   Specimen Description BLOOD PICC LINE  Final   Special Requests BOTTLES DRAWN AEROBIC AND ANAEROBIC  5CC  Final   Culture NO GROWTH 2 DAYS  Final   Report Status PENDING  Incomplete  Urine culture     Status: Abnormal (Preliminary result)   Collection Time: 07/10/15  8:04 PM  Result Value Ref Range Status   Specimen Description URINE, RANDOM  Final   Special Requests NONE  Final   Culture (A)  Final    50,000 COLONIES/mL YEAST IDENTIFICATION TO FOLLOW    Report Status PENDING  Incomplete    IMAGING: Dg Chest 2 View  06/21/2015  CLINICAL DATA:  Increased sputum production with hemoptysis, 1 week history of chest  congestion, chronically altered mental status. EXAM: CHEST  2 VIEW COMPARISON:  Portable chest x-ray of June 12, 2015 FINDINGS: Today's study is obtained in a somewhat more lordotic fashion than the previous study. The lungs are mildly hypoinflated. The interstitial markings are coarse. There is density in the right upper lobe that is more conspicuous today. The left retrocardiac density has increased. The cardiac silhouette remains enlarged. The pulmonary vascularity is not clearly engorged. A small right pleural effusion is suspected. The permanent pacemaker is in reasonable position radiographically. The sternal wires are intact. The left internal jugular venous catheter tip projects over the distal third of the SVC. IMPRESSION: Increased density in the right upper lobe worrisome for progressive pneumonia. Increased retrocardiac density on the left consistent with worsening atelectasis or development of pneumonia as well. Mild stick stable cardiac silhouette enlargement without pulmonary vascular congestion. Electronically Signed   By: Thoren Hosang  [place] M.D.   On: 06/21/2015 16:09   Dg Chest Port 1 View  07/10/2015  CLINICAL DATA:  82 year old female with altered mental status. EXAM: PORTABLE CHEST 1 VIEW COMPARISON:  Most recent radiographs 06/21/2015 FINDINGS: Increase/progressive right pleural effusion that appears circumferential. Hazy opacity at the left lung base suspicious for pleural effusion. Bibasilar opacities. Cardiomegaly is unchanged. Patient is post median sternotomy. There is a left central line with tip in the atrial caval junction. Right perihilar and suprahilar opacities have increased. Question developing vascular congestion. Right-sided pacemaker remains in place. Chronic deformity of the right shoulder with dislocation. IMPRESSION: 1. Increased bilateral pleural effusions, right greater than left. Probable vascular congestion. Question fluid overload/CHF. 2. Increasing right perihilar and  suprahilar opacities, may be vascular, atelectasis or pneumonia. Underlying pulmonary mass or right paratracheal adenopathy is difficult to exclude on this portable exam. Electronically Signed   By: Jeb Levering M.D.   On: 07/10/2015 18:58   3/1  Aerobic Culture Mixed Gram Positive/Gram Negative Organisms Isolated   Gram Stain Result No polymorphonuclear leukocytes seen  Gram Stain Result 4+ Mixed Gram Positive/Negative Organisms    Specimen  Wound - Other-enter as order comment   CRP 05/22/15 = 23, ESR 48  [facility] DC summary admit 3/1-3/13 Hospital Course:  Jasmine Briggs is a 82 year old female with a history of HTN, complete heart block s/p PPM, dementia, blindness, CKD stage III, and gout who presented with a stage IV pressure ulcer with osteomyelitis.   Osteomyelitis secondary to stage IV pressure ulcer of sacrum, POA. Large pressure ulcer tracking likely to bone vs tendon on exam with elevated CRP and ESR on admission, no fever or leukocytosis. Lactic acid improved to 1.6 from 3.1 on admission. Her wound culture grew 4+ mixed gram positive and gram negative organisms, xray revealed osteomyelitis of the distal sacrum/proximal coccyx. She was started on empiric therapy with vancomycin and zosyn. Zosyn was changed for ertapenem on 3/6 for once a day dosing. She will be discharged home with home infusion for 6 weeks of IV vancomycin and ertapenem (3/1 - 4/11). She has an appt with ID on June 24, 2015.   Patient with elevated LFTs which are attributed to ertapenum currently, but not 4x upper limit of normal. Will continue current dosing, but will need careful monitoring. May need alternative plan for antibiotics. Vancomycin dosing is also tenuous and needs careful monitoring, dose reducing prior to discharge, but may need to consider q48 hr discharge.   Of note given her advanced age, multiple conversations were held regarding her goals of care and treatment after explanation that IV antibiotics are  not going to heal her wound that is present due to her inability to get out of bed on her own and properly position herself. Patient and family refused SNF despite patient's functional status and understood risk of taking patient home in current status. The patient made it clear that she wants to go home, but is difficult to understand if she wants antibiotics or not but it is clear that her family (notably [name]) wants IV antibiotics for her, family is still wanting full code and all medical treatment.   Bladder Stretch Injury: Patient found to have 870 cc of urine in bladder the morning of 05/28/15 which removal of 800 cc by I/O catheterization. Foley catheter replaced for 2 weeks until 3/21. Will follow up with urology.   Right upper extremity DVT: Likely provoked in the setting of PICC/IV. Discussed plan with Dr. Joan Flores with hematology and will plan to treat for total of 4 weeks. Will continue apixiban at 5 mg twice a day (3/10-17), followed by 2.5 mg BID x3 weeks. Then can discontinue with no follow-up imaging if patient has no signs or symptoms of persistent clot.   DM. HbA1c 9.4% 05/04/14. Not on meds at home. No medications initiated  HTN. Re-started on amlodipine 10 mg daily   Dementia. No formal diagnosis (MOCA, MMSE, or SLUMS) but has been documented in multiple clinic notes.   Gout: Continued on allopurinol 100 mg daily, uric acid 3 thus colchicine 0.3 mg discontinued as her last documented flare has been multiple years ago.   CKDIII - Baseline creatinine 1.1, all meds dose adjusted accordingly  Assessment:   Kimiya Brunelle is a 82 y.o. female with dementia, bedbound, chronic foley, malnutrition (alb 2.7) sacral decub with osteomyelitis recently on vanco and invanz at home admitted with AMS, anasarca.  Her wbc is nml, no fevers. She has been on abx now since approx 3/1 - . No fevers, wbc nml.  Her cultures at  [facility] from her wound were mixed gram pos and gram neg. Her treatment with vanco and  ertapenem was empiric.  She continues to have wound care and the wound is improving  Recommendations Would change to oral cipro 500 bid and doxy 100 bid for 7 days Fluconazole for 7 more days She will no longer need her Central line at DC - can leave in now if necessary given anasarca and difficulyt IV access but PLEASE ENSURE LINE IS REMOVED PRIOR TO DC   Thank you very much for allowing me to participate in the care of this patient. Please call with questions.   Cheral Marker. Ola Spurr, MD

## 2015-07-12 NOTE — Progress Notes (Signed)
Dr. Orvan Falconerampbell consulted, spoke with her about when she would see patient. She stated that it would be around 12:30, updated on patient condition. Trudee KusterBrandi R Mansfield

## 2015-07-12 NOTE — Clinical Documentation Improvement (Signed)
Internal Medicine  Based on the clinical findings below, please document in next progress note any associated diagnoses/conditions the patient has or may have.   Other  Clinically Undetermined  Supporting Information:  Hypotensive on admission - 96/56 with Map of 66  Hypothermic - 94.5 Rectally  Has source of infection with organ failure - Aspiration Pneumonia, Acute Diastolic CHF, ARF  WBC 4.5 on admission increasing to 9.2 while on antibiotics via PICC  Being treated with IV Vancomycin q24h  Please exercise your independent, professional judgment when responding. A specific answer is not anticipated or expected.  Thank You, Shellee MiloEileen T Zamiah Tollett RN, BSN, CCDS Health Information Management [place] [phone]; Cell: [phone]

## 2015-07-13 LAB — GLUCOSE, CAPILLARY
GLUCOSE-CAPILLARY: 120 mg/dL — AB (ref 65–99)
Glucose-Capillary: 122 mg/dL — ABNORMAL HIGH (ref 65–99)
Glucose-Capillary: 122 mg/dL — ABNORMAL HIGH (ref 65–99)

## 2015-07-13 LAB — CBC
HCT: 27.1 % — ABNORMAL LOW (ref 35.0–47.0)
HEMOGLOBIN: 9 g/dL — AB (ref 12.0–16.0)
MCH: 28.9 pg (ref 26.0–34.0)
MCHC: 33.1 g/dL (ref 32.0–36.0)
MCV: 87.5 fL (ref 80.0–100.0)
Platelets: 151 10*3/uL (ref 150–440)
RBC: 3.1 MIL/uL — ABNORMAL LOW (ref 3.80–5.20)
RDW: 20.1 % — AB (ref 11.5–14.5)
WBC: 9.2 10*3/uL (ref 3.6–11.0)

## 2015-07-13 LAB — BASIC METABOLIC PANEL
Anion gap: 7 (ref 5–15)
BUN: 37 mg/dL — AB (ref 6–20)
CALCIUM: 9.1 mg/dL (ref 8.9–10.3)
CO2: 21 mmol/L — AB (ref 22–32)
CREATININE: 1.61 mg/dL — AB (ref 0.44–1.00)
Chloride: 120 mmol/L — ABNORMAL HIGH (ref 101–111)
GFR calc Af Amer: 31 mL/min — ABNORMAL LOW (ref >60)
GFR, EST NON AFRICAN AMERICAN: 27 mL/min — AB (ref >60)
GLUCOSE: 141 mg/dL — AB (ref 65–99)
Potassium: 3.2 mmol/L — ABNORMAL LOW (ref 3.5–5.1)
Sodium: 148 mmol/L — ABNORMAL HIGH (ref 135–145)

## 2015-07-13 LAB — URINE CULTURE

## 2015-07-13 MED ORDER — MINERAL OIL RE ENEM
1.0000 | ENEMA | Freq: Once | RECTAL | Status: AC
  Administered 2015-07-14: 1 via RECTAL

## 2015-07-13 MED ORDER — SODIUM CHLORIDE 0.9% FLUSH
10.0000 mL | Freq: Two times a day (BID) | INTRAVENOUS | Status: DC
  Administered 2015-07-13 – 2015-07-25 (×13): 10 mL

## 2015-07-13 MED ORDER — FUROSEMIDE 10 MG/ML IJ SOLN
4.0000 mg/h | INTRAVENOUS | Status: AC
  Administered 2015-07-13: 4 mg/h via INTRAVENOUS
  Filled 2015-07-13: qty 25

## 2015-07-13 MED ORDER — FLUCONAZOLE IN SODIUM CHLORIDE 100-0.9 MG/50ML-% IV SOLN
100.0000 mg | Freq: Once | INTRAVENOUS | Status: AC
  Administered 2015-07-13: 100 mg via INTRAVENOUS
  Filled 2015-07-13: qty 50

## 2015-07-13 MED ORDER — POTASSIUM CHLORIDE 20 MEQ/15ML (10%) PO SOLN: 40.0000 meq | Freq: Once | ORAL | Status: DC

## 2015-07-13 MED ORDER — SODIUM CHLORIDE 0.9% FLUSH: 10.0000 mL | INTRAVENOUS | Status: DC | PRN

## 2015-07-13 MED ORDER — FLUCONAZOLE IN SODIUM CHLORIDE 100-0.9 MG/50ML-% IV SOLN
100.0000 mg | INTRAVENOUS | Status: DC
  Administered 2015-07-14 – 2015-07-17 (×4): 100 mg via INTRAVENOUS
  Filled 2015-07-13 (×4): qty 50

## 2015-07-13 NOTE — Consult Note (Signed)
[facility]  CONSULT NOTE    Date: 07/13/2015                  Patient Name:  Jasmine Briggs  MRN: [id number]  DOB: Jan 17, 1921  Age / Sex: 82 y.o., female         PCP: Kurtis Bushman, MD                 Service Requesting Consult: Dr. Imogene Burn                 Reason for Consult: Acute renal failure on chronic kidney disease stage III with systolic congestive heart failure            History of Present Illness: Jasmine Briggs is a 82 y.o. black female with dementia, hypertension, diabetes mellitus type II, gout, who was admitted to [facility] on 07/10/2015 for Anasarca [R60.1] Hypothermia, initial encounter [T68.XXXA]  Patient unable to give history. History taken from chart and from family. Daughter at bedside. She became less talkative and altered. Found to have systolic congestive heart failure on this admission. Placed on furosemide gtt. This was stopped due to hypotension and worsening renal function.   She continues to get zosyn and vanc for osteomyelitis of sacrum. Also with candida in urine culture - diflucan given.   Patient is noncommunicative.    Medications: Outpatient medications: Prescriptions prior to admission  Medication Sig Dispense Refill Last Dose  . acetaminophen (TYLENOL) 500 MG tablet Take 500 mg by mouth every 6 (six) hours as needed for mild pain.    Past Week at Unknown time  . allopurinol (ZYLOPRIM) 100 MG tablet Take 100 mg by mouth daily.   07/10/2015 at Unknown time  . amLODipine (NORVASC) 10 MG tablet Take 10 mg by mouth daily.    07/10/2015 at Unknown time  . ertapenem (INVANZ) 1 g SOLR injection Inject 1 g into the vein daily.   07/10/2015 at Unknown time  . HEPARIN SODIUM, PORCINE, IV Inject 2.5 mLs into the vein daily. Heparin, porcine 100 units/mL.   07/10/2015 at Unknown time  . ibuprofen (ADVIL,MOTRIN) 600 MG tablet Take 600 mg by mouth every 6 (six) hours as needed for mild pain.   Past Week at Unknown time  . polyethylene glycol  (MIRALAX / GLYCOLAX) packet Take 17 g by mouth daily as needed for mild constipation.    Past Week at Unknown time  . QUEtiapine (SEROQUEL) 25 MG tablet Take 1 tablet (25 mg total) by mouth at bedtime. 30 tablet 0 07/09/2015 at Unknown time  . vancomycin 500 mg in sodium chloride 0.9 % 100 mL Inject 500 mg into the vein daily. 15 Dose 0 07/10/2015 at Unknown time    Current medications: Current Facility-Administered Medications  Medication Dose Route Frequency Provider Last Rate Last Dose  . enoxaparin (LOVENOX) injection 30 mg  30 mg Subcutaneous Q24H Shaune Pollack, MD   30 mg at 07/12/15 2229  . fluconazole (DIFLUCAN) IVPB 100 mg  100 mg Intravenous Once Shaune Pollack, MD   100 mg at 07/13/15 1214  . [START ON 07/14/2015] fluconazole (DIFLUCAN) IVPB 100 mg  100 mg Intravenous Q24H Shaune Pollack, MD      . ondansetron [facility]) tablet 4 mg  4 mg Oral Q6H PRN Oralia Manis, MD       Or  . ondansetron [facility]) injection 4 mg  4 mg Intravenous Q6H PRN Oralia Manis, MD      . piperacillin-tazobactam (ZOSYN) IVPB  3.375 g  3.375 g Intravenous [address] [place], RPH   3.375 g at 07/13/15 7425  . potassium chloride 20 MEQ/15ML (10%) solution 40 mEq  40 mEq Oral Once Shaune Pollack, MD      . sodium chloride flush (NS) 0.9 % injection 10-40 mL  10-40 mL Intracatheter Q12H Shaune Pollack, MD   10 mL at 07/13/15 1005  . sodium chloride flush (NS) 0.9 % injection 10-40 mL  10-40 mL Intracatheter PRN Shaune Pollack, MD      . sodium chloride flush (NS) 0.9 % injection 3 mL  3 mL Intravenous Q12H Oralia Manis, MD   3 mL at 07/13/15 1006  . vancomycin (VANCOCIN) 500 mg in sodium chloride 0.9 % 100 mL IVPB  500 mg Intravenous Q24H Cindi Carbon, RPH   500 mg at 07/13/15 9563      Allergies: No Known Allergies    Past Medical History: Past Medical History  Diagnosis Date  . Dementia   . Osteomyelitis (HCC)     sacral wound  . Hypertension   . Arthritis   . Diabetes mellitus without complication (HCC)   . Gout      Past  Surgical History: Past Surgical History  Procedure Laterality Date  . Picc line placement    . Cardiac surgery    . Pacemaker insertion       Family History: Family History  Problem Relation Age of Onset  . Diabetes Maternal Aunt      Social History: Social History   Social History  . Marital Status: Widowed    Spouse Name: N/A  . Number of Children: N/A  . Years of Education: N/A   Occupational History  . Not on file.   Social History Main Topics  . Smoking status: Never Smoker   . Smokeless tobacco: Former Neurosurgeon    Types: Snuff  . Alcohol Use: No  . Drug Use: No  . Sexual Activity: Not on file   Other Topics Concern  . Not on file   Social History Narrative   From home.      Review of Systems: Review of Systems  Unable to perform ROS: dementia    Vital Signs: Blood pressure 125/59, pulse 74, temperature 96 F (35.6 C), temperature source Axillary, resp. rate 18, height  (1.702 m), weight 84.188 kg (185 lb 9.6 oz), SpO2 94 %.  Weight trends: Filed Weights   07/10/15 1757 07/10/15 2305  Weight: 90.719 kg (200 lb) 84.188 kg (185 lb 9.6 oz)    Physical Exam: General: NAD, laying in bed  Head: Normocephalic, atraumatic. Moist oral mucosal membranes  Eyes: Anicteric, PERRL  Neck: Supple, trachea midline  Lungs:  Diminished bilaterally  Heart: Regular rate and rhythm  Abdomen:  Soft, nontender, obese  Extremities: ++ peripheral edema.  Neurologic: Not following commands, moving all four extremities.   Skin: No lesions  GU: Foley with urine     Lab results: Basic Metabolic Panel:  Recent Labs Lab 07/11/15 0454 07/11/15 1300 07/12/15 0644 07/13/15 0435  NA 147*  --  148* 148*  K 3.8  --  3.6 3.2*  CL 120*  --  119* 120*  CO2 17*  --  20* 21*  GLUCOSE 95  --  117* 141*  BUN 31*  --  32* 37*  CREATININE 1.39*  --  1.59* 1.61*  CALCIUM 9.1  --  9.5 9.1  MG  --  2.5*  --   --  Liver Function Tests:  Recent Labs Lab  07/10/15 1821  AST 33  ALT 20  ALKPHOS 78  BILITOT 0.4  PROT 6.6  ALBUMIN 2.7*   No results for input(s): LIPASE, AMYLASE in the last 168 hours. No results for input(s): AMMONIA in the last 168 hours.  CBC:  Recent Labs Lab 07/10/15 1821 07/11/15 0540 07/12/15 0644 07/13/15 0435  WBC 4.5 4.9 9.2 9.2  NEUTROABS 3.3  --   --   --   HGB 8.1* 7.0* 9.7* 9.0*  HCT 25.5* 21.6* 29.3* 27.1*  MCV 89.5 87.0 85.8 87.5  PLT 175 145* 172 151    Cardiac Enzymes:  Recent Labs Lab 07/10/15 1821  TROPONINI <0.03    BNP: Invalid input(s): POCBNP  CBG:  Recent Labs Lab 07/12/15 1158 07/12/15 1802 07/12/15 2358 07/13/15 0501 07/13/15 1159  GLUCAP 99 130* 120* 122* 122*    Microbiology: Results for orders placed or performed during the hospital encounter of 07/10/15  Blood culture (routine x 2)     Status: None (Preliminary result)   Collection Time: 07/10/15  6:23 PM  Result Value Ref Range Status   Specimen Description BLOOD PICC LINE  Final   Special Requests BOTTLES DRAWN AEROBIC AND ANAEROBIC  8CC  Final   Culture NO GROWTH 2 DAYS  Final   Report Status PENDING  Incomplete  Blood culture (routine x 2)     Status: None (Preliminary result)   Collection Time: 07/10/15  7:00 PM  Result Value Ref Range Status   Specimen Description BLOOD PICC LINE  Final   Special Requests BOTTLES DRAWN AEROBIC AND ANAEROBIC  5CC  Final   Culture NO GROWTH 2 DAYS  Final   Report Status PENDING  Incomplete  Urine culture     Status: Abnormal   Collection Time: 07/10/15  8:04 PM  Result Value Ref Range Status   Specimen Description URINE, RANDOM  Final   Special Requests NONE  Final   Culture 50,000 COLONIES/mL CANDIDA ALBICANS (A)  Final   Report Status 07/13/2015 FINAL  Final    Coagulation Studies:  Recent Labs  07/10/15 1821  LABPROT 17.3*  INR 1.40    Urinalysis:  Recent Labs  07/10/15 2004  COLORURINE AMBER*  LABSPEC 1.018  PHURINE 5.0  GLUCOSEU 50*  HGBUR  2+*  BILIRUBINUR NEGATIVE  KETONESUR NEGATIVE  PROTEINUR 100*  NITRITE NEGATIVE  LEUKOCYTESUR 1+*      Imaging:  No results found.   Assessment & Plan: Jasmine Briggs is a 82 y.o. black female with dementia, hypertension, diabetes mellitus type II, gout, who [facility]   1. Acute renal failure on chronic kidney disease stage III 2. Acute exacerbation of systolic congestive heart failure: EF of 40-45%. With anasarca 3. Chronic sacral osteomyelitis 4. Urinary tract infection  Will restart furosemide gtt.  Appreciate palliative care.      LOS: 3 Arnell Slivinski 4/22/201712:39 PM

## 2015-07-13 NOTE — Progress Notes (Signed)
Speech Language Pathology Dysphagia Treatment Patient Details Name: Jeanella CaraSarah Kadlec MRN: [id number] DOB: 08/05/1920 Today's Date: 07/13/2015 Time: [id number] SLP Time Calculation (min) (ACUTE ONLY): 19 min  Assessment / Plan / Recommendation Clinical Impression   pt continues to present with a moderate to severe oral pharyngeal dysphagia characterized by anterior spillage of bolus trials with all po intake. Pt with lengthy mastication of trials of puree as well as liquids. Pt was noted to bite spoon and required significant cuing to release it. Pt with wet vocal quality post intake of puree trials and nectar thick liquids, as well as weak cough post intake. Family was not present for session. Pt intake poor with x 4 bites puree and 2 oz nectar thick liquids via spoon. SLP recommends to continue current diet. Prognosis for this patient is guarded.     Diet Recommendation    Dysphagia 1 with nectar thick liquids   SLP Plan Continue with current plan of care   Pertinent Vitals/Pain No pain reported   Swallowing Goals     General Behavior/Cognition: Cooperative;Confused;Pleasant mood;Distractible;Requires cueing;Doesn't follow directions Patient Positioning: Upright in bed Oral care provided: Yes HPI: Pt is a 82 y.o. female with a known history of Dementia, sacral decubitus with osteomyelitis - stage 3 now per chart, hypertension, diabetes, adult failure to thrive who presents with Alteration of Mental Status and diffuse swelling over her body. Patient was recently admitted here with pneumonia and UTI and was treated with antibiotics and improved some. She was discharged home with a PICC line and IV antibiotics. Per notes from recent admissions, pt was evaluated by ST and had been tolerating puree diet with Nectar then thin liquids upon discharge. Currently, pt is NPO sec. to lethargy and AMS. Pt mumbled intermittently but unable to determine what pt said. Pt required max. cues (verbal/tactile) to  participate in the session/po trials. Total assistance w/ strict aspiration precautions.   Oral Cavity - Oral Hygiene     Dysphagia Treatment Treatment Methods: Skilled observation;Compensation strategy training Patient observed directly with PO's: Yes Type of PO's observed: Dysphagia 1 (puree);Nectar-thick liquids Feeding: Total assist Liquids provided via: Teaspoon Oral Phase Signs & Symptoms: Anterior loss/spillage;Prolonged mastication Pharyngeal Phase Signs & Symptoms: Audible swallow;Multiple swallows Type of cueing: Verbal;Tactile Amount of cueing: Maximal   GO     Meredith PelStacie Harris Sauber 07/13/2015, 11:06 AM

## 2015-07-13 NOTE — Progress Notes (Signed)
[facility] [place] [facility]   PATIENT NAME: Jasmine CaraSarah Briggs    MR#:  [id number]  DATE OF BIRTH:  14-Apr-1920  SUBJECTIVE:  CHIEF COMPLAINT:   Chief Complaint  Patient presents with  . Altered Mental Status   The patient is confused, non-communicative. REVIEW OF SYSTEMS:  Unable to get ROS.  DRUG ALLERGIES:  No Known Allergies  VITALS:  Blood pressure 125/59, pulse 74, temperature 96 F (35.6 C), temperature source Axillary, resp. rate 18, height 5\' 7"  (1.702 m), weight 84.188 kg (185 lb 9.6 oz), SpO2 94 %.  PHYSICAL EXAMINATION:  GENERAL:  82 y.o.-year-old patient lying in the bed with no acute distress.  EYES: blindness. HEENT: Head atraumatic, normocephalic.  NECK:  Supple, no jugular venous distention. No thyroid enlargement, no tenderness.  LUNGS: Normal breath sounds bilaterally, no wheezing, bilateral rales. No use of accessory muscles of respiration.  CARDIOVASCULAR: S1, S2 normal. No murmurs, rubs, or gallops.  ABDOMEN: Soft, nontender, nondistended. Bowel sounds present. No organomegaly or mass.  EXTREMITIES: bilateral arm and leg edema 2+, no cyanosis, or clubbing.  NEUROLOGIC: unable to exam. PSYCHIATRIC: The patient opened eyes to verbal stimuli. Noncommunicative. SKIN: Sacral DU stage 3.    LABORATORY PANEL:   CBC  Recent Labs Lab 07/13/15 0435  WBC 9.2  HGB 9.0*  HCT 27.1*  PLT 151   ------------------------------------------------------------------------------------------------------------------  Chemistries   Recent Labs Lab 07/10/15 1821  07/11/15 1300  07/13/15 0435  NA 146*  < >  --   < > 148*  K 4.1  < >  --   < > 3.2*  CL 117*  < >  --   < > 120*  CO2 18*  < >  --   < > 21*  GLUCOSE 77  < >  --   < > 141*  BUN 30*  < >  --   < > 37*  CREATININE 1.35*  < >  --   < > 1.61*  CALCIUM 9.4  < >  --   < > 9.1  MG  --   --  2.5*  --   --   AST 33  --   --   --   --   ALT 20  --   --   --   --   ALKPHOS 78   --   --   --   --   BILITOT 0.4  --   --   --   --   < > = values in this interval not displayed. ------------------------------------------------------------------------------------------------------------------  Cardiac Enzymes  Recent Labs Lab 07/10/15 1821  TROPONINI <0.03   ------------------------------------------------------------------------------------------------------------------  RADIOLOGY:  No results found.  EKG:   Orders placed or performed during the hospital encounter of 07/10/15  . EKG 12-Lead  . EKG 12-Lead    ASSESSMENT AND PLAN:   Acute diastolicCHF (congestive heart failure) (HCC)  She was on a low rate Lasix drip, with concurrent albumin infusion. Echocardiogram: EF: 45% - 50% . carefully diuresis following renal function and electrolytes per Dr. Lady GaryFath,  cardiology consult. Resume Lasix drip.   Anasarca. gentle diuresis with albumin infusion as above. Lasix drip was on hold due to worsening renal function and low side blood pressure. Resume Lasix drip per nephrology consult.  Sacral DU stage 3, wound care.  Acute renal failure on chronic kidney disease stage III, Worsening.  resume lasix drip per Dr. Wynelle LinkKolluru.  Hypernatremia. Worsening, F/u BMP.  History of sacral  osteomyelitis, Aspiration pneumonia and UTI. Continue Zosyn plus vancomycin. Negative Blood cultures so far. urine cultures: CANDIDA ALBICANS. Diflucan pharmacy to dose. F/u Dr. Sampson Goon.   Acute metabolic encephalopathy and Dementia. Started dysphagia 1 diet per speech study. Aspiration precaution.    HTN (hypertension) - blood pressure borderline low, holding antihypertensives for now.  Type 2 diabetes mellitus (HCC) - sliding scale insulin with corresponding glucose checks  Anemia of chronic disease. Hb decreased from 8.1 to 7.0.  Status post 1 units PRBC transfusion, Hb increased to 9.7. No active bleeding. Hemoglobin is stable.   Poor prognosis.  Dr. Orvan Falconer had a  meeting with family members. Per Dr. Orvan Falconer, FM (3/6 children) want further treatment without hospice care or SNF placement.  I discussed with Dr. Wynelle Link. Physical therapy evaluation suggested skilled nursing facility placement. All the records are reviewed and case discussed with Care Management/Social Workerr. Management plans discussed with the patient, her daughter and they are in agreement.  CODE STATUS: DNR  TOTAL TIME TAKING CARE OF THIS PATIENT: 41 minutes.  Greater than 50% time was spent on coordination of care and face-to-face counseling.  POSSIBLE D/C IN >3 DAYS, DEPENDING ON CLINICAL CONDITION.   Shaune Pollack M.D on 07/13/2015 at 2:32 PM  Between 7am to 6pm - Pager - [phone]  After 6pm go to [email or web address] - password [email or web address] [facility]  [place] [facility]  Office  [phone]  CC: Primary care physician; Kurtis Bushman, MD

## 2015-07-13 NOTE — Consult Note (Signed)
Pharmacy Antibiotic Note  Jasmine Briggs is a 82 y.o. female admitted on 07/10/2015 with UTI.  Pharmacy has been consulted for fluconazole dosing.  Plan: fluconazole 100mg  daily  Height: 5\' 7"  (170.2 cm) Weight: 185 lb 9.6 oz (84.188 kg) IBW/kg (Calculated) : 61.6  Temp (24hrs), Avg:97.1 F (36.2 C), Min:96 F (35.6 C), Max:97.6 F (36.4 C)   Recent Labs Lab 07/10/15 1821 07/10/15 1822 07/11/15 0454 07/11/15 0540 07/12/15 0644 07/13/15 0435  WBC 4.5  --   --  4.9 9.2 9.2  CREATININE 1.35*  --  1.39*  --  1.59* 1.61*  LATICACIDVEN  --  1.4  --   --   --   --     Estimated Creatinine Clearance: 24.8 mL/min (by C-G formula based on Cr of 1.61).    No Known Allergies  Antimicrobials this admission: Vancomycin 4/19 >> Piperacillin/tazobactam 4/19 >>   Dose adjustments this admission:   Microbiology results: Recent Results (from the past 240 hour(s))  Blood culture (routine x 2)     Status: None (Preliminary result)   Collection Time: 07/10/15  6:23 PM  Result Value Ref Range Status   Specimen Description BLOOD PICC LINE  Final   Special Requests BOTTLES DRAWN AEROBIC AND ANAEROBIC  8CC  Final   Culture NO GROWTH 2 DAYS  Final   Report Status PENDING  Incomplete  Blood culture (routine x 2)     Status: None (Preliminary result)   Collection Time: 07/10/15  7:00 PM  Result Value Ref Range Status   Specimen Description BLOOD PICC LINE  Final   Special Requests BOTTLES DRAWN AEROBIC AND ANAEROBIC  5CC  Final   Culture NO GROWTH 2 DAYS  Final   Report Status PENDING  Incomplete  Urine culture     Status: Abnormal   Collection Time: 07/10/15  8:04 PM  Result Value Ref Range Status   Specimen Description URINE, RANDOM  Final   Special Requests NONE  Final   Culture 50,000 COLONIES/mL CANDIDA ALBICANS (A)  Final   Report Status 07/13/2015 FINAL  Final     Thank you for allowing pharmacy to be a part of this patient's care.  Olene FlossMelissa D Tamila Gaulin, Pharm.D Clinical  Pharmacist   07/13/2015 11:37 AM

## 2015-07-13 NOTE — Progress Notes (Addendum)
Pt has not urinated for 24 hours per documentation. Attempted to deflate balloon and advance catheter without success. Also, attempted to reposition catheter to which produce no results. MD Anne HahnWillis notified. Order given to irrigate catheter. RN will attempt irrigation. Will continue to monitor.   Jasmine Briggs, Jasmine Briggs    Update 12:41 AM :  Foley irrigation completed as order by physician. Initially 20 mL of urine obtained. Will continue to monitor.

## 2015-07-14 LAB — BASIC METABOLIC PANEL
Anion gap: 9 (ref 5–15)
BUN: 43 mg/dL — AB (ref 6–20)
CHLORIDE: 123 mmol/L — AB (ref 101–111)
CO2: 20 mmol/L — ABNORMAL LOW (ref 22–32)
CREATININE: 1.7 mg/dL — AB (ref 0.44–1.00)
Calcium: 9.1 mg/dL (ref 8.9–10.3)
GFR, EST AFRICAN AMERICAN: 29 mL/min — AB (ref >60)
GFR, EST NON AFRICAN AMERICAN: 25 mL/min — AB (ref >60)
Glucose, Bld: 159 mg/dL — ABNORMAL HIGH (ref 65–99)
POTASSIUM: 3.4 mmol/L — AB (ref 3.5–5.1)
SODIUM: 152 mmol/L — AB (ref 135–145)

## 2015-07-14 LAB — VANCOMYCIN, TROUGH: Vancomycin Tr: 40 ug/mL (ref 10–20)

## 2015-07-14 LAB — CBC
HCT: 24.1 % — ABNORMAL LOW (ref 35.0–47.0)
Hemoglobin: 7.9 g/dL — ABNORMAL LOW (ref 12.0–16.0)
MCH: 28.7 pg (ref 26.0–34.0)
MCHC: 32.6 g/dL (ref 32.0–36.0)
MCV: 88.2 fL (ref 80.0–100.0)
PLATELETS: 128 10*3/uL — AB (ref 150–440)
RBC: 2.74 MIL/uL — AB (ref 3.80–5.20)
RDW: 20.9 % — AB (ref 11.5–14.5)
WBC: 8.5 10*3/uL (ref 3.6–11.0)

## 2015-07-14 LAB — GLUCOSE, CAPILLARY
GLUCOSE-CAPILLARY: 128 mg/dL — AB (ref 65–99)
GLUCOSE-CAPILLARY: 131 mg/dL — AB (ref 65–99)
GLUCOSE-CAPILLARY: 142 mg/dL — AB (ref 65–99)

## 2015-07-14 NOTE — Progress Notes (Signed)
Pharmacy Antibiotic Note  Jasmine Briggs is a 82 y.o. female admitted on 07/10/2015 with pneumonia.  Pharmacy has been consulted for vancomycin and Zosyn dosing.  Patient was recently admitted and discharged on 4/5 with a PICC line on antibiotics for sacral decub and possible OM, UTI, and pneumonia. Patient was receiving vancomycin 500 mg IV daily PTA.  Unclear how long patient has been receiving treatment for OM.  Goal vanc trough 15-20 mcg/mL  Vancomycin trough not drawn prior to dose 4/23, rescheduled for this AM. Trough this morning: 6240mcg/ml. Creatinine continues to increase  Plan: Vancomycin discontinued. RN to stop dose that is currently hanging. Will recheck random level in 24 hours and re-dose.   Will continue zosyn 3.375gm IV Q8H for now, will change to Q12H if renal function worsens (for CrCl < 20)  Height: 5\' 7"  (170.2 cm) Weight: 185 lb 9.6 oz (84.188 kg) IBW/kg (Calculated) : 61.6  Temp (24hrs), Avg:96.3 F (35.7 C), Min:95 F (35 C), Max:97.6 F (36.4 C)   Recent Labs Lab 07/10/15 1821 07/10/15 1822 07/11/15 0454 07/11/15 0540 07/12/15 0644 07/13/15 0435 07/14/15 0557 07/14/15 0735  WBC 4.5  --   --  4.9 9.2 9.2 8.5  --   CREATININE 1.35*  --  1.39*  --  1.59* 1.61* 1.70*  --   LATICACIDVEN  --  1.4  --   --   --   --   --   --   VANCOTROUGH  --   --   --   --   --   --   --  40*    Estimated Creatinine Clearance: 23.5 mL/min (by C-G formula based on Cr of 1.7).    No Known Allergies  Antimicrobials this admission: vancomycin Zosyn >>    >>   Dose adjustments this admission: 4/20 Vancomycin 1gm IV Q24H -> 500mg  Q24H 4/20 Zosyn 4.5gm IV Q8H -> 3.75gm IV Q8H 4/23 Vancomycin held, will redose  Microbiology results: 4/19 BCx: pending 4/19 UCx: 50 k candida albicans 4/19 Sputum: pending   Thank you for allowing pharmacy to be a part of this patient's care.  Martyn MalayBarefoot,Kaito Schulenburg C, PharmD Clinical Pharmacist 07/14/2015 8:52 AM

## 2015-07-14 NOTE — Progress Notes (Addendum)
Pt straining to have bowel movement, when providing peri care blood noted on wipe, blood coming from rectum. Stool can visually be seen by nursing staff. MD Anne HahnWillis notified for disimpaction order. RN will disimpact. Will continue to monitor.   Mayra NeerNesbitt, Maximillion Gill M  Update: Upon disimpacting pt, moderate amount of bowel removed. Nursing staff can still feel impacted stool. Pt will benefit from enema. MD Anne HahnWillis notified for enema order. Order provided. Nursing staff to administer enema.   Mayra NeerNesbitt, Kervens Roper M

## 2015-07-14 NOTE — Progress Notes (Signed)
[facility]  ROUNDING NOTE   Subjective:   Son at bedside.   T min 95  UOP 1015 with furosemide gtt   Objective:  Vital signs in last 24 hours:  Temp:  [95 F (35 C)-97.6 F (36.4 C)] 97.6 F (36.4 C) (04/23 0800) Pulse Rate:  [66-74] 66 (04/23 0800) Resp:  [15-24] 15 (04/23 0800) BP: (104-125)/(52-71) 112/52 mmHg (04/23 0800) SpO2:  [94 %-98 %] 98 % (04/23 0800)  Weight change:  Filed Weights   07/10/15 1757 07/10/15 2305  Weight: 90.719 kg (200 lb) 84.188 kg (185 lb 9.6 oz)    Intake/Output: I/O last 3 completed shifts: In: -  Out: 1015 [Urine:1015]   Intake/Output this shift:     Physical Exam: General: NAD, laying in bed  Head: Normocephalic, atraumatic. Moist oral mucosal membranes  Eyes: Anicteric, PERRL  Neck: Supple, trachea midline  Lungs:  Diminished bilaterally  Heart: Regular rate and rhythm  Abdomen:  Soft, nontender, obese  Extremities: ++ peripheral edema.  Neurologic: Not following commands, moving all four extremities  Skin: No lesions  GU: Foley    Basic Metabolic Panel:  Recent Labs Lab 07/10/15 1821 07/11/15 0454 07/11/15 1300 07/12/15 0644 07/13/15 0435 07/14/15 0557  NA 146* 147*  --  148* 148* 152*  K 4.1 3.8  --  3.6 3.2* 3.4*  CL 117* 120*  --  119* 120* 123*  CO2 18* 17*  --  20* 21* 20*  GLUCOSE 77 95  --  117* 141* 159*  BUN 30* 31*  --  32* 37* 43*  CREATININE 1.35* 1.39*  --  1.59* 1.61* 1.70*  CALCIUM 9.4 9.1  --  9.5 9.1 9.1  MG  --   --  2.5*  --   --   --     Liver Function Tests:  Recent Labs Lab 07/10/15 1821  AST 33  ALT 20  ALKPHOS 78  BILITOT 0.4  PROT 6.6  ALBUMIN 2.7*   No results for input(s): LIPASE, AMYLASE in the last 168 hours. No results for input(s): AMMONIA in the last 168 hours.  CBC:  Recent Labs Lab 07/10/15 1821 07/11/15 0540 07/12/15 0644 07/13/15 0435 07/14/15 0557  WBC 4.5 4.9 9.2 9.2 8.5  NEUTROABS 3.3  --   --   --   --   HGB 8.1* 7.0* 9.7* 9.0* 7.9*   HCT 25.5* 21.6* 29.3* 27.1* 24.1*  MCV 89.5 87.0 85.8 87.5 88.2  PLT 175 145* 172 151 128*    Cardiac Enzymes:  Recent Labs Lab 07/10/15 1821  TROPONINI <0.03    BNP: Invalid input(s): POCBNP  CBG:  Recent Labs Lab 07/12/15 2358 07/13/15 0501 07/13/15 1159 07/14/15 0020 07/14/15 0602  GLUCAP 120* 122* 122* 128* 142*    Microbiology: Results for orders placed or performed during the hospital encounter of 07/10/15  Blood culture (routine x 2)     Status: None (Preliminary result)   Collection Time: 07/10/15  6:23 PM  Result Value Ref Range Status   Specimen Description BLOOD PICC LINE  Final   Special Requests BOTTLES DRAWN AEROBIC AND ANAEROBIC  8CC  Final   Culture NO GROWTH 4 DAYS  Final   Report Status PENDING  Incomplete  Blood culture (routine x 2)     Status: None (Preliminary result)   Collection Time: 07/10/15  7:00 PM  Result Value Ref Range Status   Specimen Description BLOOD PICC LINE  Final   Special Requests BOTTLES DRAWN AEROBIC AND  ANAEROBIC  5CC  Final   Culture NO GROWTH 4 DAYS  Final   Report Status PENDING  Incomplete  Urine culture     Status: Abnormal   Collection Time: 07/10/15  8:04 PM  Result Value Ref Range Status   Specimen Description URINE, RANDOM  Final   Special Requests NONE  Final   Culture 50,000 COLONIES/mL CANDIDA ALBICANS (A)  Final   Report Status 07/13/2015 FINAL  Final    Coagulation Studies: No results for input(s): LABPROT, INR in the last 72 hours.  Urinalysis: No results for input(s): COLORURINE, LABSPEC, PHURINE, GLUCOSEU, HGBUR, BILIRUBINUR, KETONESUR, PROTEINUR, UROBILINOGEN, NITRITE, LEUKOCYTESUR in the last 72 hours.  Invalid input(s): APPERANCEUR    Imaging: No results found.   Medications:   . furosemide (LASIX) infusion Stopped (07/14/15 0939)   . enoxaparin (LOVENOX) injection  30 mg Subcutaneous Q24H  . fluconazole (DIFLUCAN) IV  100 mg Intravenous Q24H  . piperacillin-tazobactam (ZOSYN)  IV   3.375 g Intravenous Q8H  . potassium chloride  40 mEq Oral Once  . sodium chloride flush  10-40 mL Intracatheter Q12H  . sodium chloride flush  3 mL Intravenous Q12H   ondansetron **OR** ondansetron (ZOFRAN) IV, sodium chloride flush  Assessment/ Plan:  Ms. Parissa Chiao is a 82 y.o. black female with dementia, hypertension, diabetes mellitus type II, gout, who was admitted to [facility] on 07/10/2015   1. Acute renal failure on chronic kidney disease stage III: due to acute exacerbation of CHF causing acute cardiorenal syndrome. Creatinine fluctuates with fluid status. Baseline most likely ~1.3 from 05/2015.  - Unfortunately patient is not tolerating furosemide and IV albumin well. Will discontinue - Discussed dialysis with family. Do not believe patient is a candidate. Family expresses understanding.   2. Hypernatremia: secondary to loop diuretics.   3. Acute exacerbation of systolic congestive heart failure: EF of 40-45%. With anasarca. And hypoalbuminia.  - discussed case with Dr. Lady Gary.   3. Chronic sacral osteomyelitis: vanco and pip/tazo  4. Urinary tract infection: fungal: treated with diflucan.   Appreciate palliative care. Discussed care with Hospitalist and Cardiology   LOS: 4 [place], Meriah Shands 4/23/20179:54 AM

## 2015-07-14 NOTE — Progress Notes (Addendum)
[facility] - [place] at [facility]   PATIENT NAME: Jasmine Briggs    MR#:  [id number]  DATE OF BIRTH:  03-Sep-1920  SUBJECTIVE:  CHIEF COMPLAINT:   Chief Complaint  Patient presents with  . Altered Mental Status   The patient is nonverbal, non-communicative. Very poor oral intake oliguria for 24 h per RN. REVIEW OF SYSTEMS:  Unable to get ROS.  DRUG ALLERGIES:  No Known Allergies  VITALS:  Blood pressure 112/52, pulse 66, temperature 97.6 F (36.4 C), temperature source Axillary, resp. rate 15, height  (1.702 m), weight 84.188 kg (185 lb 9.6 oz), SpO2 98 %.  PHYSICAL EXAMINATION:  GENERAL:  82 y.o.-year-old patient lying in the bed with no acute distress.  EYES: blindness. HEENT: Head atraumatic, normocephalic.  NECK:  Supple, no jugular venous distention. No thyroid enlargement, no tenderness.  LUNGS: Normal breath sounds bilaterally, no wheezing, bilateral rales. No use of accessory muscles of respiration.  CARDIOVASCULAR: S1, S2 normal. No murmurs, rubs, or gallops.  ABDOMEN: Soft, nontender, nondistended. Bowel sounds present. No organomegaly or mass.  EXTREMITIES: bilateral arm and leg edema 3+, no cyanosis, or clubbing.  NEUROLOGIC: unable to exam. PSYCHIATRIC: The patient opened eyes to verbal stimuli. Noncommunicative. SKIN: Sacral DU stage 3.    LABORATORY PANEL:   CBC  Recent Labs Lab 07/14/15 0557  WBC 8.5  HGB 7.9*  HCT 24.1*  PLT 128*   ------------------------------------------------------------------------------------------------------------------  Chemistries   Recent Labs Lab 07/10/15 1821  07/11/15 1300  07/14/15 0557  NA 146*  < >  --   < > 152*  K 4.1  < >  --   < > 3.4*  CL 117*  < >  --   < > 123*  CO2 18*  < >  --   < > 20*  GLUCOSE 77  < >  --   < > 159*  BUN 30*  < >  --   < > 43*  CREATININE 1.35*  < >  --   < > 1.70*  CALCIUM 9.4  < >  --   < > 9.1  MG  --   --  2.5*  --   --   AST 33  --   --    --   --   ALT 20  --   --   --   --   ALKPHOS 78  --   --   --   --   BILITOT 0.4  --   --   --   --   < > = values in this interval not displayed. ------------------------------------------------------------------------------------------------------------------  Cardiac Enzymes  Recent Labs Lab 07/10/15 1821  TROPONINI <0.03   ------------------------------------------------------------------------------------------------------------------  RADIOLOGY:  No results found.  EKG:   Orders placed or performed during the hospital encounter of 07/10/15  . EKG 12-Lead  . EKG 12-Lead    ASSESSMENT AND PLAN:   Acute diastolicCHF (congestive heart failure) (HCC)  She was on a low rate Lasix drip, with concurrent albumin infusion. Echocardiogram: EF: 45% - 50% . carefully diuresis following renal function and electrolytes per Dr. Lady Gary,  cardiology consult. Resumed Lasix drip but discontinued due to worsening renal function.   Anasarca. gentle diuresis with albumin infusion as above. Lasix drip was on hold due to worsening renal function and low side blood pressure. Resumed Lasix drip per nephrology consult. Discontinue lasix drip due to worsening renal function per Dr. Wynelle Link.  Sacral DU stage 3, wound  care.  Acute renal failure on chronic kidney disease stage III, Worsening. Oliguria. Discontinue lasix drip per Dr. Wynelle LinkKolluru.   Hypernatremia. secondary to loop diuretics. Worsening, F/u BMP. Hypokalemia. KCl supplement.  History of sacral osteomyelitis, Aspiration pneumonia and UTI. Continue Zosyn, hold vancomycin due to elevated level. Negative Blood cultures so far. urine cultures: CANDIDA ALBICANS. Diflucan pharmacy to dose. F/u Dr. Sampson GoonFitzgerald.   Acute metabolic encephalopathy and Dementia. Started dysphagia 1 diet per speech study. Aspiration precaution.    HTN (hypertension) - blood pressure borderline low, holding antihypertensives for now.  Type 2 diabetes  mellitus (HCC) - sliding scale insulin with corresponding glucose checks  Anemia of chronic disease. Hb decreased from 8.1 to 7.0.  Status post 1 units PRBC transfusion, Hb increased to 9.7. No active bleeding. Hemoglobin decreased to 7.9 today. F/u Hb.   Poor prognosis.  Dr. Orvan Falconerampbell had a meeting with family members. Per Dr. Orvan Falconerampbell, FM (3/6 children) want further treatment without hospice care or SNF placement.  I discussed with her son about the worsening condition, high risk for cardiopulmonary arrest and very poor prognosis. He will discuss with other FM.  I discussed with Dr. Lady GaryFath.  Physical therapy evaluation suggested skilled nursing facility placement. All the records are reviewed and case discussed with Care Management/Social Workerr. Management plans discussed with the patient, her son and they are in agreement.  CODE STATUS: DNR  TOTAL TIME TAKING CARE OF THIS PATIENT: 46 minutes.  Greater than 50% time was spent on coordination of care and face-to-face counseling.  POSSIBLE D/C IN >3 DAYS, DEPENDING ON CLINICAL CONDITION.   Shaune Pollackhen, Nedda Gains M.D on 07/14/2015 at 9:54 AM  Between 7am to 6pm - Pager - [phone]  After 6pm go to [email or web address] - password [email or web address] [email or web address]  [facility]  Office  [phone]  CC: Primary care physician; Kurtis BushmanGLADMAN, CHRISTINE D, MD

## 2015-07-14 NOTE — Care Management Important Message (Signed)
Important Message  Patient Details  Name: Jasmine Briggs MRN: [id number] Date of Birth: 11/12/1920   Medicare Important Message Given:  Yes    Jazmene Racz A, RN 07/14/2015, 2:13 PM

## 2015-07-15 LAB — BASIC METABOLIC PANEL
ANION GAP: 9 (ref 5–15)
BUN: 48 mg/dL — ABNORMAL HIGH (ref 6–20)
CALCIUM: 9.2 mg/dL (ref 8.9–10.3)
CO2: 22 mmol/L (ref 22–32)
CREATININE: 1.75 mg/dL — AB (ref 0.44–1.00)
Chloride: 123 mmol/L — ABNORMAL HIGH (ref 101–111)
GFR, EST AFRICAN AMERICAN: 28 mL/min — AB (ref >60)
GFR, EST NON AFRICAN AMERICAN: 24 mL/min — AB (ref >60)
Glucose, Bld: 136 mg/dL — ABNORMAL HIGH (ref 65–99)
Potassium: 3 mmol/L — ABNORMAL LOW (ref 3.5–5.1)
SODIUM: 154 mmol/L — AB (ref 135–145)

## 2015-07-15 LAB — CBC
HEMATOCRIT: 23.4 % — AB (ref 35.0–47.0)
Hemoglobin: 7.7 g/dL — ABNORMAL LOW (ref 12.0–16.0)
MCH: 28.6 pg (ref 26.0–34.0)
MCHC: 32.9 g/dL (ref 32.0–36.0)
MCV: 86.9 fL (ref 80.0–100.0)
PLATELETS: 127 10*3/uL — AB (ref 150–440)
RBC: 2.69 MIL/uL — ABNORMAL LOW (ref 3.80–5.20)
RDW: 20.2 % — AB (ref 11.5–14.5)
WBC: 9.8 10*3/uL (ref 3.6–11.0)

## 2015-07-15 LAB — URINALYSIS COMPLETE WITH MICROSCOPIC (ARMC ONLY)
BILIRUBIN URINE: NEGATIVE
GLUCOSE, UA: NEGATIVE mg/dL
Ketones, ur: NEGATIVE mg/dL
Nitrite: NEGATIVE
Protein, ur: 100 mg/dL — AB
Specific Gravity, Urine: 1.01 (ref 1.005–1.030)
pH: 5 (ref 5.0–8.0)

## 2015-07-15 LAB — GLUCOSE, CAPILLARY
GLUCOSE-CAPILLARY: 136 mg/dL — AB (ref 65–99)
GLUCOSE-CAPILLARY: 157 mg/dL — AB (ref 65–99)
Glucose-Capillary: 115 mg/dL — ABNORMAL HIGH (ref 65–99)
Glucose-Capillary: 118 mg/dL — ABNORMAL HIGH (ref 65–99)

## 2015-07-15 LAB — VANCOMYCIN, RANDOM: Vancomycin Rm: 38 ug/mL

## 2015-07-15 LAB — MAGNESIUM: MAGNESIUM: 2.3 mg/dL (ref 1.7–2.4)

## 2015-07-15 MED ORDER — POTASSIUM CHLORIDE 20 MEQ/15ML (10%) PO SOLN
60.0000 meq | Freq: Once | ORAL | Status: AC
  Administered 2015-07-15: 60 meq via ORAL
  Filled 2015-07-15: qty 45

## 2015-07-15 MED ORDER — POTASSIUM CHLORIDE CRYS ER 20 MEQ PO TBCR: 60.0000 meq | EXTENDED_RELEASE_TABLET | Freq: Once | ORAL | Status: DC

## 2015-07-15 MED ORDER — FUROSEMIDE 10 MG/ML IJ SOLN
4.0000 mg/h | INTRAVENOUS | Status: DC
  Administered 2015-07-15 – 2015-07-25 (×4): 4 mg/h via INTRAVENOUS
  Filled 2015-07-15 (×4): qty 25

## 2015-07-15 MED ORDER — DOXYCYCLINE HYCLATE 100 MG PO TABS
100.0000 mg | ORAL_TABLET | Freq: Two times a day (BID) | ORAL | Status: DC
  Administered 2015-07-15 – 2015-07-17 (×5): 100 mg via ORAL
  Filled 2015-07-15 (×7): qty 1

## 2015-07-15 MED ORDER — CIPROFLOXACIN HCL 500 MG PO TABS
500.0000 mg | ORAL_TABLET | Freq: Two times a day (BID) | ORAL | Status: DC
  Administered 2015-07-15: 500 mg via ORAL
  Filled 2015-07-15 (×2): qty 1

## 2015-07-15 NOTE — Progress Notes (Signed)
[facility]  ROUNDING NOTE   Subjective:   Nursing staff reports that in the last few hours, urine has turned blood-tinged This happened after they turned the patient UOP 950 cc. Appears to be somewhat tachypnea, short of breath Sodium level high at 152->154  Objective:  Vital signs in last 24 hours:  Temp:  [97.5 F (36.4 C)-99.7 F (37.6 C)] 97.5 F (36.4 C) (04/24 1215) Pulse Rate:  [70-76] 71 (04/24 1215) Resp:  [16-22] 16 (04/24 1215) BP: (115-136)/(53-78) 115/53 mmHg (04/24 1215) SpO2:  [96 %-100 %] 100 % (04/24 1215)  Weight change:  Filed Weights   07/10/15 1757 07/10/15 2305  Weight: 90.719 kg (200 lb) 84.188 kg (185 lb 9.6 oz)    Intake/Output: I/O last 3 completed shifts: In: -  Out: 1850 [Urine:1850]   Intake/Output this shift:     Physical Exam: General: NAD, laying in bed  Head: Normocephalic, atraumatic. Moist oral mucosal membranes  Eyes: Anicteric,   Neck: Supple, trachea midline  Lungs:  Diffuse mild crackles bilaterally  Heart: Irregular rhythm  Abdomen:  Soft, nontender, obese  Extremities: ++ peripheral edema.  Neurologic: Not following commands,   Skin: No lesions  GU: Foley With pink urine     Basic Metabolic Panel:  Recent Labs Lab 07/11/15 0454 07/11/15 1300 07/12/15 0644 07/13/15 0435 07/14/15 0557 07/15/15 0948  NA 147*  --  148* 148* 152* 154*  K 3.8  --  3.6 3.2* 3.4* 3.0*  CL 120*  --  119* 120* 123* 123*  CO2 17*  --  20* 21* 20* 22  GLUCOSE 95  --  117* 141* 159* 136*  BUN 31*  --  32* 37* 43* 48*  CREATININE 1.39*  --  1.59* 1.61* 1.70* 1.75*  CALCIUM 9.1  --  9.5 9.1 9.1 9.2  MG  --  2.5*  --   --   --  2.3    Liver Function Tests:  Recent Labs Lab 07/10/15 1821  AST 33  ALT 20  ALKPHOS 78  BILITOT 0.4  PROT 6.6  ALBUMIN 2.7*   No results for input(s): LIPASE, AMYLASE in the last 168 hours. No results for input(s): AMMONIA in the last 168 hours.  CBC:  Recent Labs Lab  07/10/15 1821 07/11/15 0540 07/12/15 0644 07/13/15 0435 07/14/15 0557 07/15/15 0948  WBC 4.5 4.9 9.2 9.2 8.5 9.8  NEUTROABS 3.3  --   --   --   --   --   HGB 8.1* 7.0* 9.7* 9.0* 7.9* 7.7*  HCT 25.5* 21.6* 29.3* 27.1* 24.1* 23.4*  MCV 89.5 87.0 85.8 87.5 88.2 86.9  PLT 175 145* 172 151 128* 127*    Cardiac Enzymes:  Recent Labs Lab 07/10/15 1821  TROPONINI <0.03    BNP: Invalid input(s): POCBNP  CBG:  Recent Labs Lab 07/14/15 0602 07/14/15 1205 07/15/15 07/15/15 0607 07/15/15 1212  GLUCAP 142* 131* 118* 115* 136*    Microbiology: Results for orders placed or performed during the hospital encounter of 07/10/15  Blood culture (routine x 2)     Status: None (Preliminary result)   Collection Time: 07/10/15  6:23 PM  Result Value Ref Range Status   Specimen Description BLOOD PICC LINE  Final   Special Requests BOTTLES DRAWN AEROBIC AND ANAEROBIC  8CC  Final   Culture NO GROWTH 4 DAYS  Final   Report Status PENDING  Incomplete  Blood culture (routine x 2)     Status: None (Preliminary result)  Collection Time: 07/10/15  7:00 PM  Result Value Ref Range Status   Specimen Description BLOOD PICC LINE  Final   Special Requests BOTTLES DRAWN AEROBIC AND ANAEROBIC  5CC  Final   Culture NO GROWTH 4 DAYS  Final   Report Status PENDING  Incomplete  Urine culture     Status: Abnormal   Collection Time: 07/10/15  8:04 PM  Result Value Ref Range Status   Specimen Description URINE, RANDOM  Final   Special Requests NONE  Final   Culture 50,000 COLONIES/mL CANDIDA ALBICANS (A)  Final   Report Status 07/13/2015 FINAL  Final    Coagulation Studies: No results for input(s): LABPROT, INR in the last 72 hours.  Urinalysis:  Recent Labs  07/15/15 1558  COLORURINE PENDING  LABSPEC PENDING  PHURINE PENDING  GLUCOSEU PENDING  HGBUR PENDING  BILIRUBINUR PENDING  KETONESUR PENDING  PROTEINUR PENDING  NITRITE PENDING  LEUKOCYTESUR PENDING      Imaging: No results  found.   Medications:   . furosemide (LASIX) infusion 4 mg/hr (07/15/15 1659)   . ciprofloxacin  500 mg Oral BID  . doxycycline  100 mg Oral Q12H  . enoxaparin (LOVENOX) injection  30 mg Subcutaneous Q24H  . fluconazole (DIFLUCAN) IV  100 mg Intravenous Q24H  . sodium chloride flush  10-40 mL Intracatheter Q12H  . sodium chloride flush  3 mL Intravenous Q12H   ondansetron **OR** ondansetron (ZOFRAN) IV, sodium chloride flush  Assessment/ Plan:  [name] is a 82 y.o. black female with dementia, hypertension, diabetes mellitus type II, gout, who was admitted to [facility] on 07/10/2015   1. Acute renal failure on chronic kidney disease stage III: due to acute exacerbation of CHF causing acute cardiorenal syndrome. Creatinine fluctuates with fluid status. Baseline most likely ~1.3 from 05/2015.  - Urine output is fair, serum creatinine 1.75  2. Hypernatremia: . - Patient needs free water replacement because of difficulty swallowing - Unsure about the plans for NG tube at present  3. Acute exacerbation of systolic congestive heart failure: EF of 40-45%. With anasarca. And hypoalbuminia.  - discussed case with Dr. Lady Gary.   3. Chronic sacral osteomyelitis: vanco and pip/tazo  4. Anasarca with third spacing - Restart Lasix drip to address pulmonary edema  5. Gross hematuria - Hold Lovenox - Send urinalysis - Can consider renal ultrasound in the morning, if stable from hemodynamic and respiratory standpoint     LOS: 5 Jasmine Briggs 4/24/20175:02 PM

## 2015-07-15 NOTE — Progress Notes (Addendum)
Speech Therapy Note: reviewed chart notes; consulted NSG re: pt's status today. Pt appears to be tolerating bites/sips of her Dys. 1 diet w/ Nectar liquids (meds in Puree - Crushed) w/ no consistent, overt s/s of aspiration noted. NSG following aspiration precautions as posted. Pt appears at her baseline per report.  Due to pt's declined medical and Cognitive status', recommend continue a Dys. 1 diet w/ Nectar liquids w/ aspiration precautions; feeding assistance; meds in puree - Crushed or mixed in a Nectar liquid. Will provide ordering information for thickened liquids, and thickened liquids/thickener, for discharge home if pt is to return home w/ family as per chart.

## 2015-07-15 NOTE — Care Management Note (Signed)
Case Management Note  Patient Details  Name: Jeanella CaraSarah Vint MRN: [id number] Date of Birth: [birth year]  Subjective/Objective:      Discussed discharge planning today with Dr Imogene Burnhen who reports that he is still actively hydrating Ms Antonellis per CKD. No current plans for discharge. Admitted 07/10/15 with acute CHF and PNA. Sacral decubiti stage IV, Blind, Dementia. Large number of supportive family members.Family wants aggressive care, no Palliative Care, no hospice, no SNF. Mrs Dot BeenDark has a PICC line. Lives at home with her son who has administered her IV antiibiotics in the past. Anticipate discharge home with IV antibiotics. Currently open to [facility].  Case management will follow for discharge planning.              Action/Plan:   Expected Discharge Date:                  Expected Discharge Plan:     In-House Referral:     Discharge planning Services     Post Acute Care Choice:    Choice offered to:     DME Arranged:    DME Agency:     HH Arranged:    HH Agency:     Status of Service:     Medicare Important Message Given:  Yes Date Medicare IM Given:    Medicare IM give by:    Date Additional Medicare IM Given:    Additional Medicare Important Message give by:     If discussed at Long Length of Stay Meetings, dates discussed:    Additional Comments:  Baneen Wieseler A, RN 07/15/2015, 1:08 PM

## 2015-07-15 NOTE — Progress Notes (Signed)
Pharmacy Antibiotic Note  Jeanella CaraSarah Vanvoorhis is a 82 y.o. female admitted on 07/10/2015 with pneumonia.  Pharmacy has been consulted for vancomycin and Zosyn dosing. 4/24 MD consulted pharmacy for Cipro dosing. Discontinuing Vancomcyin and Zosyn.  Patient was recently admitted and discharged on 4/5 with a PICC line on antibiotics for sacral decub and possible OM, UTI, and pneumonia. Patient was receiving vancomycin 500 mg IV daily PTA.  Unclear how long patient has been receiving treatment for OM.   Plan: Will order Cipro 500mg  PO BID for 7 days per Dr. Myrtis HoppingFitzgeralds note. Ideally would like to dose at q18h but this would not work well at discharge.  Height: 5\' 7"  (170.2 cm) Weight: 185 lb 9.6 oz (84.188 kg) IBW/kg (Calculated) : 61.6  Temp (24hrs), Avg:98.6 F (37 C), Min:97.5 F (36.4 C), Max:99.7 F (37.6 C)   Recent Labs Lab 07/10/15 1822 07/11/15 0454 07/11/15 0540 07/12/15 0644 07/13/15 0435 07/14/15 0557 07/14/15 0735 07/15/15 0635 07/15/15 0948  WBC  --   --  4.9 9.2 9.2 8.5  --   --  9.8  CREATININE  --  1.39*  --  1.59* 1.61* 1.70*  --   --  1.75*  LATICACIDVEN 1.4  --   --   --   --   --   --   --   --   VANCOTROUGH  --   --   --   --   --   --  40*  --   --   VANCORANDOM  --   --   --   --   --   --   --  38  --     Estimated Creatinine Clearance: 22.9 mL/min (by C-G formula based on Cr of 1.75).    No Known Allergies  Antimicrobials this admission: Vancomycin 4/20 >> 4/24  Zosyn 4/20 >> 4/24 Diflucan 4/21 >>  Doxycycline 4/24 >> Cipro 4/24 >>  Dose adjustments this admission: 4/20 Vancomycin 1gm IV Q24H -> 500mg  Q24H 4/20 Zosyn 4.5gm IV Q8H -> 3.75gm IV Q8H 4/23 Vancomycin held, will redose 4/24 Vancomycin and Zosyn discontinued  Microbiology results: 4/19 BCx: pending 4/19 UCx: 50 k candida albicans 4/19 Sputum: pending   Thank you for allowing pharmacy to be a part of this patient's care.  Clovia CuffLisa Tilda Samudio, PharmD, BCPS 07/15/2015 3:53 PM

## 2015-07-15 NOTE — Progress Notes (Signed)
[facility] Physicians - [place] at [facility]   PATIENT NAME: Jasmine Briggs    MR#:  [id number]  DATE OF BIRTH:  Sep 01, 1920  SUBJECTIVE:  CHIEF COMPLAINT:   Chief Complaint  Patient presents with  . Altered Mental Status   The patient is nonverbal, non-communicative. More urine output.  REVIEW OF SYSTEMS:  Unable to get ROS.  DRUG ALLERGIES:  No Known Allergies  VITALS:  Blood pressure 115/53, pulse 71, temperature 97.5 F (36.4 C), temperature source Oral, resp. rate 16, height  (1.702 m), weight 84.188 kg (185 lb 9.6 oz), SpO2 100 %.  PHYSICAL EXAMINATION:  GENERAL:  82 y.o.-year-old patient lying in the bed with no acute distress.  EYES: blindness. HEENT: Head atraumatic, normocephalic.  NECK:  Supple, no jugular venous distention. No thyroid enlargement, no tenderness.  LUNGS: Normal breath sounds bilaterally, no wheezing, bilateral rales. No use of accessory muscles of respiration.  CARDIOVASCULAR: S1, S2 normal. No murmurs, rubs, or gallops.  ABDOMEN: Soft, nontender, nondistended. Bowel sounds present. No organomegaly or mass.  EXTREMITIES: bilateral arm and leg edema 3+, no cyanosis, or clubbing.  NEUROLOGIC: unable to exam. PSYCHIATRIC: The patient opened eyes to verbal stimuli. Noncommunicative. SKIN: Sacral DU stage 3.    LABORATORY PANEL:   CBC  Recent Labs Lab 07/15/15 0948  WBC 9.8  HGB 7.7*  HCT 23.4*  PLT 127*   ------------------------------------------------------------------------------------------------------------------  Chemistries   Recent Labs Lab 07/10/15 1821  07/15/15 0948  NA 146*  < > 154*  K 4.1  < > 3.0*  CL 117*  < > 123*  CO2 18*  < > 22  GLUCOSE 77  < > 136*  BUN 30*  < > 48*  CREATININE 1.35*  < > 1.75*  CALCIUM 9.4  < > 9.2  MG  --   < > 2.3  AST 33  --   --   ALT 20  --   --   ALKPHOS 78  --   --   BILITOT 0.4  --   --   < > = values in this interval not  displayed. ------------------------------------------------------------------------------------------------------------------  Cardiac Enzymes  Recent Labs Lab 07/10/15 1821  TROPONINI <0.03   ------------------------------------------------------------------------------------------------------------------  RADIOLOGY:  No results found.  EKG:   Orders placed or performed during the hospital encounter of 07/10/15  . EKG 12-Lead  . EKG 12-Lead    ASSESSMENT AND PLAN:   Acute diastolicCHF (congestive heart failure) (HCC)  She was on a low rate Lasix drip, with concurrent albumin infusion. Echocardiogram: EF: 45% - 50% . carefully diuresis following renal function and electrolytes per Dr. Lady Gary,  cardiology consult. Resumed Lasix drip but discontinued due to worsening renal function.   Anasarca. gentle diuresis with albumin infusion as above. Lasix drip was on hold due to worsening renal function and low side blood pressure. Resumed Lasix drip per nephrology consult. Discontinued lasix drip due to worsening renal function per Dr. Wynelle Link.  Sacral DU stage 3, wound care.  Acute renal failure on chronic kidney disease stage III, Worsening. Discontinued lasix drip per Dr. Wynelle Link.   Hypernatremia. secondary to loop diuretics. Worsening, F/u BMP. Hypokalemia. KCl supplement.  History of sacral osteomyelitis, Aspiration pneumonia and UTI. Continue Zosyn, hold vancomycin due to elevated level. Negative Blood cultures so far. urine cultures: CANDIDA ALBICANS. Continue Diflucan. F/u Dr. Sampson Goon.   Acute metabolic encephalopathy and Dementia. Started dysphagia 1 diet per speech study. Aspiration precaution.    HTN (hypertension) - blood pressure borderline  low, holding antihypertensives for now.  Type 2 diabetes mellitus (HCC) - sliding scale insulin with corresponding glucose checks  Anemia of chronic disease. Hb decreased from 8.1 to 7.0.  Status post 1 units PRBC  transfusion, Hb increased to 9.7. No active bleeding. Hemoglobin decreased to 7.9 today. F/u Hb.   Poor prognosis.  Dr. Orvan Falconerampbell had a meeting with family members. Per Dr. Orvan Falconerampbell, FM (3/6 children) want further treatment without hospice care or SNF placement.  I discussed with her son about the worsening condition, high risk for cardiopulmonary arrest and very poor prognosis. He discussed with other FM and they want to continue treatment.  I discussed with Dr. Lady GaryFath.  Physical therapy evaluation suggested skilled nursing facility placement. All the records are reviewed and case discussed with Care Management/Social Workerr. Management plans discussed with the patient, her son and they are in agreement.  CODE STATUS: DNR  TOTAL TIME TAKING CARE OF THIS PATIENT: 42 minutes.  Greater than 50% time was spent on coordination of care and face-to-face counseling.  POSSIBLE D/C IN >3 DAYS, DEPENDING ON CLINICAL CONDITION.   Shaune Pollackhen, Liliyana Thobe M.D on 07/15/2015 at 12:41 PM  Between 7am to 6pm - Pager - [phone]  After 6pm go to [email or web address] - password [email or web address] [email or web address]  [facility]  Office  [phone]  CC: Primary care physician; Kurtis BushmanGLADMAN, CHRISTINE D, MD

## 2015-07-15 NOTE — Progress Notes (Addendum)
Went in to turn patient and noticed that patient had blood tinged urine in her tubing of foley Patient also appeared to be tachypnea and short of breath. Dr. Thedore MinsSingh notified stated he will put in some orders. Per Dr. Thedore MinsSingh please hold lovenox until further notice see his note as well

## 2015-07-16 LAB — BASIC METABOLIC PANEL
Anion gap: 5 (ref 5–15)
BUN: 48 mg/dL — AB (ref 6–20)
CHLORIDE: 125 mmol/L — AB (ref 101–111)
CO2: 25 mmol/L (ref 22–32)
Calcium: 9.4 mg/dL (ref 8.9–10.3)
Creatinine, Ser: 1.72 mg/dL — ABNORMAL HIGH (ref 0.44–1.00)
GFR calc Af Amer: 29 mL/min — ABNORMAL LOW (ref >60)
GFR calc non Af Amer: 25 mL/min — ABNORMAL LOW (ref >60)
GLUCOSE: 194 mg/dL — AB (ref 65–99)
POTASSIUM: 3.4 mmol/L — AB (ref 3.5–5.1)
Sodium: 155 mmol/L — ABNORMAL HIGH (ref 135–145)

## 2015-07-16 LAB — GLUCOSE, CAPILLARY
Glucose-Capillary: 158 mg/dL — ABNORMAL HIGH (ref 65–99)
Glucose-Capillary: 169 mg/dL — ABNORMAL HIGH (ref 65–99)
Glucose-Capillary: 179 mg/dL — ABNORMAL HIGH (ref 65–99)

## 2015-07-16 LAB — CBC
HEMATOCRIT: 24.2 % — AB (ref 35.0–47.0)
HEMOGLOBIN: 7.8 g/dL — AB (ref 12.0–16.0)
MCH: 28.8 pg (ref 26.0–34.0)
MCHC: 32.2 g/dL (ref 32.0–36.0)
MCV: 89.3 fL (ref 80.0–100.0)
Platelets: 145 10*3/uL — ABNORMAL LOW (ref 150–440)
RBC: 2.71 MIL/uL — ABNORMAL LOW (ref 3.80–5.20)
RDW: 21.5 % — AB (ref 11.5–14.5)
WBC: 9.6 10*3/uL (ref 3.6–11.0)

## 2015-07-16 MED ORDER — CIPROFLOXACIN HCL 500 MG PO TABS
250.0000 mg | ORAL_TABLET | Freq: Two times a day (BID) | ORAL | Status: DC
  Administered 2015-07-16 – 2015-07-17 (×3): 250 mg via ORAL
  Filled 2015-07-16 (×4): qty 1

## 2015-07-16 NOTE — Care Management Important Message (Signed)
Important Message  Patient Details  Name: Jasmine Briggs MRN: [id number] Date of Birth: 10/01/1920   Medicare Important Message Given:  Yes    Olegario MessierKathy A Venice Marcucci 07/16/2015, 11:22 AM

## 2015-07-16 NOTE — Progress Notes (Signed)
[facility] - [place] at [facility]   PATIENT NAME: Jasmine Briggs    MR#:  [id number]  DATE OF BIRTH:  [birth year]  SUBJECTIVE:  CHIEF COMPLAINT:   Chief Complaint  Patient presents with  . Altered Mental Status   The patient is nonverbal, non-communicative. More urine output.  REVIEW OF SYSTEMS:  Unable to get ROS.  DRUG ALLERGIES:  No Known Allergies  VITALS:  Blood pressure 107/74, pulse 70, temperature 97.4 F (36.3 C), temperature source Oral, resp. rate 14, height 5\' 7"  (1.702 m), weight 84.188 kg (185 lb 9.6 oz), SpO2 99 %.  PHYSICAL EXAMINATION:  GENERAL:  82 y.o.-year-old patient lying in the bed with no acute distress.  EYES: blindness. HEENT: Head atraumatic, normocephalic.  NECK:  Supple, no jugular venous distention. No thyroid enlargement, no tenderness.  LUNGS: Normal breath sounds bilaterally, no wheezing, bilateral rales. No use of accessory muscles of respiration.  CARDIOVASCULAR: S1, S2 normal. No murmurs, rubs, or gallops.  ABDOMEN: Soft, nontender, nondistended. Bowel sounds present. No organomegaly or mass.  EXTREMITIES: bilateral arm and leg edema 3+, no cyanosis, or clubbing.  NEUROLOGIC: unable to exam. PSYCHIATRIC: The patient opened eyes to verbal stimuli. Noncommunicative. SKIN: Sacral DU stage 3.    LABORATORY PANEL:   CBC  Recent Labs Lab 07/16/15 0626  WBC 9.6  HGB 7.8*  HCT 24.2*  PLT 145*   ------------------------------------------------------------------------------------------------------------------  Chemistries   Recent Labs Lab 07/10/15 1821  07/15/15 0948 07/16/15 0626  NA 146*  < > 154* 155*  K 4.1  < > 3.0* 3.4*  CL 117*  < > 123* 125*  CO2 18*  < > 22 25  GLUCOSE 77  < > 136* 194*  BUN 30*  < > 48* 48*  CREATININE 1.35*  < > 1.75* 1.72*  CALCIUM 9.4  < > 9.2 9.4  MG  --   < > 2.3  --   AST 33  --   --   --   ALT 20  --   --   --   ALKPHOS 78  --   --   --   BILITOT 0.4  --   --   --    < > = values in this interval not displayed. ------------------------------------------------------------------------------------------------------------------  Cardiac Enzymes  Recent Labs Lab 07/10/15 1821  TROPONINI <0.03   ------------------------------------------------------------------------------------------------------------------  RADIOLOGY:  No results found.  EKG:   Orders placed or performed during the hospital encounter of 07/10/15  . EKG 12-Lead  . EKG 12-Lead    ASSESSMENT AND PLAN:   Acute diastolicCHF (congestive heart failure) (HCC)  She was on a low rate Lasix drip, with concurrent albumin infusion. Echocardiogram: EF: 45% - 50% . carefully diuresis following renal function and electrolytes per Dr. Lady GaryFath,  cardiology consult. Resumed Lasix drip.   Anasarca. gentle diuresis with albumin infusion as above. Lasix drip was on hold due to worsening renal function and low side blood pressure. Resumed Lasix drip per nephrology consult. Discontinued lasix drip due to worsening renal function and restarted per Dr. Dr. Thedore MinsSingh.  Sacral DU stage 3, wound care.  Acute renal failure on chronic kidney disease stage III, Worsening but stable . Resumed  lasix drip per Dr. Thedore MinsSingh.   Hypernatremia. secondary to loop diuretics. Worsening, F/u BMP. Encourage family and the nurse to give oral water.  Hypokalemia. Better, give KCl supplement. Follow-up level.   History of sacral osteomyelitis, Aspiration pneumonia and UTI.She has been treated with Zosyn, hold vancomycin  due to elevated level. Negative Blood cultures so far. urine cultures: CANDIDA ALBICANS. Would change to oral cipro 500 bid and doxy 100 bid for 7 days, Continue Diflucan for 7 more days, She will no longer need her Central line at DC per Dr. Sampson Goon.   Acute metabolic encephalopathy and Dementia. Started dysphagia 1 diet per speech study. Aspiration precaution.    HTN (hypertension) - blood  pressure borderline low, holding antihypertensives for now.  Type 2 diabetes mellitus (HCC) - sliding scale insulin with corresponding glucose checks  Anemia of chronic disease. Hb decreased from 8.1 to 7.0.  Status post 1 units PRBC transfusion, Hb increased to 9.7. No active bleeding. Hemoglobin decreased to 7.8 today. F/u Hb.  Hematuria. Hold Lovenox.   Poor prognosis.  Dr. Orvan Falconer had a meeting with family members. Per Dr. Orvan Falconer, FM (3/6 children) want further treatment without hospice care or SNF placement.  I discussed with her son about the worsening condition, high risk for cardiopulmonary arrest and very poor prognosis. He discussed with other FM and they want to continue treatment.  I discussed with Dr. Thedore Mins and Dr. Sampson Goon.  Physical therapy evaluation suggested skilled nursing facility placement. All the records are reviewed and case discussed with Care Management/Social Workerr. Management plans discussed with the patient, her son and they are in agreement.  CODE STATUS: DNR  TOTAL TIME TAKING CARE OF THIS PATIENT: 40 minutes.  Greater than 50% time was spent on coordination of care and face-to-face counseling.  POSSIBLE D/C IN >3 DAYS, DEPENDING ON CLINICAL CONDITION.   Shaune Pollack M.D on 07/16/2015 at 3:23 PM  Between 7am to 6pm - Pager - [phone]  After 6pm go to [email or web address] - password [email or web address] [facility]  [place] [facility]  Office  [phone]  CC: Primary care physician; Kurtis Bushman, MD

## 2015-07-16 NOTE — Progress Notes (Signed)
[facility]  ROUNDING NOTE   Subjective:   Urine is clear today Breathing is better Sodium remains high at 155 Serum creatinine about the same at 1.72 Patient's family members report that she was able to eat some earlier Urine output 950 cc ast 24 hours and appears to be improving with Lasix drip  Objective:  Vital signs in last 24 hours:  Temp:  [97.4 F (36.3 C)] 97.4 F (36.3 C) (04/25 0445) Pulse Rate:  [70-72] 70 (04/25 0445) Resp:  [12-14] 14 (04/25 0445) BP: (107)/(71-74) 107/74 mmHg (04/25 0445) SpO2:  [98 %-99 %] 99 % (04/25 0445)  Weight change:  Filed Weights   07/10/15 1757 07/10/15 2305  Weight: 90.719 kg (200 lb) 84.188 kg (185 lb 9.6 oz)    Intake/Output: I/O last 3 completed shifts: In: 204.1 [P.O.:100; I.V.:4.1; IV Piggyback:100] Out: 1500 [Urine:1500]   Intake/Output this shift:  Total I/O In: 10 [I.V.:10] Out: 650 [Urine:650]  Physical Exam: General: NAD, laying in bed  Head: Normocephalic, atraumatic. Moist oral mucosal membranes  Eyes: Anicteric,   Neck: Supple, trachea midline  Lungs:  Diffuse mild crackles bilaterally  Heart: Irregular rhythm  Abdomen:  Soft, nontender, obese  Extremities: ++ peripheral edema.  Neurologic: Not following commands,   Skin: No lesions  GU: Foley With pink urine     Basic Metabolic Panel:  Recent Labs Lab 07/11/15 1300 07/12/15 0644 07/13/15 0435 07/14/15 0557 07/15/15 0948 07/16/15 0626  NA  --  148* 148* 152* 154* 155*  K  --  3.6 3.2* 3.4* 3.0* 3.4*  CL  --  119* 120* 123* 123* 125*  CO2  --  20* 21* 20* 22 25  GLUCOSE  --  117* 141* 159* 136* 194*  BUN  --  32* 37* 43* 48* 48*  CREATININE  --  1.59* 1.61* 1.70* 1.75* 1.72*  CALCIUM  --  9.5 9.1 9.1 9.2 9.4  MG 2.5*  --   --   --  2.3  --     Liver Function Tests:  Recent Labs Lab 07/10/15 1821  AST 33  ALT 20  ALKPHOS 78  BILITOT 0.4  PROT 6.6  ALBUMIN 2.7*   No results for input(s): LIPASE, AMYLASE in the last  168 hours. No results for input(s): AMMONIA in the last 168 hours.  CBC:  Recent Labs Lab 07/10/15 1821  07/12/15 0644 07/13/15 0435 07/14/15 0557 07/15/15 0948 07/16/15 0626  WBC 4.5  < > 9.2 9.2 8.5 9.8 9.6  NEUTROABS 3.3  --   --   --   --   --   --   HGB 8.1*  < > 9.7* 9.0* 7.9* 7.7* 7.8*  HCT 25.5*  < > 29.3* 27.1* 24.1* 23.4* 24.2*  MCV 89.5  < > 85.8 87.5 88.2 86.9 89.3  PLT 175  < > 172 151 128* 127* 145*  < > = values in this interval not displayed.  Cardiac Enzymes:  Recent Labs Lab 07/10/15 1821  TROPONINI <0.03    BNP: Invalid input(s): POCBNP  CBG:  Recent Labs Lab 07/15/15 1212 07/15/15 1916 07/16/15 0046 07/16/15 0752 07/16/15 1309  GLUCAP 136* 157* 158* 179* 169*    Microbiology: Results for orders placed or performed during the hospital encounter of 07/10/15  Blood culture (routine x 2)     Status: None (Preliminary result)   Collection Time: 07/10/15  6:23 PM  Result Value Ref Range Status   Specimen Description BLOOD PICC LINE  Final  Special Requests BOTTLES DRAWN AEROBIC AND ANAEROBIC  8CC  Final   Culture NO GROWTH 4 DAYS  Final   Report Status PENDING  Incomplete  Blood culture (routine x 2)     Status: None (Preliminary result)   Collection Time: 07/10/15  7:00 PM  Result Value Ref Range Status   Specimen Description BLOOD PICC LINE  Final   Special Requests BOTTLES DRAWN AEROBIC AND ANAEROBIC  5CC  Final   Culture NO GROWTH 4 DAYS  Final   Report Status PENDING  Incomplete  Urine culture     Status: Abnormal   Collection Time: 07/10/15  8:04 PM  Result Value Ref Range Status   Specimen Description URINE, RANDOM  Final   Special Requests NONE  Final   Culture 50,000 COLONIES/mL CANDIDA ALBICANS (A)  Final   Report Status 07/13/2015 FINAL  Final  Urine culture     Status: None (Preliminary result)   Collection Time: 07/15/15  3:58 PM  Result Value Ref Range Status   Specimen Description URINE, CATHETERIZED  Final   Special  Requests NONE  Final   Culture NO GROWTH < 24 HOURS  Final   Report Status PENDING  Incomplete    Coagulation Studies: No results for input(s): LABPROT, INR in the last 72 hours.  Urinalysis:  Recent Labs  07/15/15 1558  COLORURINE AMBER*  LABSPEC 1.010  PHURINE 5.0  GLUCOSEU NEGATIVE  HGBUR 2+*  BILIRUBINUR NEGATIVE  KETONESUR NEGATIVE  PROTEINUR 100*  NITRITE NEGATIVE  LEUKOCYTESUR 1+*      Imaging: No results found.   Medications:   . furosemide (LASIX) infusion 4 mg/hr (07/15/15 1659)   . ciprofloxacin  250 mg Oral BID  . doxycycline  100 mg Oral Q12H  . fluconazole (DIFLUCAN) IV  100 mg Intravenous Q24H  . sodium chloride flush  10-40 mL Intracatheter Q12H  . sodium chloride flush  3 mL Intravenous Q12H   ondansetron **OR** ondansetron (ZOFRAN) IV, sodium chloride flush  Assessment/ Plan:  Jasmine Briggs is a 82 y.o. black female with dementia, hypertension, diabetes mellitus type II, gout, who [facility]   1. Acute renal failure on chronic kidney disease stage III: due to acute exacerbation of CHF causing acute cardiorenal syndrome. Creatinine fluctuates with fluid status. Baseline most likely ~1.3 from 05/2015.  - Urine output is fair, serum creatinine 1.72  2. Hypernatremia: . - Patient needs free water replacement because of difficulty swallowing - currently taking in liquids with Thick-It  3. Acute exacerbation of systolic congestive heart failure: EF of 40-45%. With anasarca. And hypoalbuminia.   . Chronic sacral osteomyelitis:  Antibiotics per primary team  5. Anasarca with third spacing - Restart Lasix drip to address pulmonary edema  6. Gross hematuria - Hold Lovenox - urine appears to be clearing     LOS: 6 Jasmine Briggs 4/25/20173:46 PM

## 2015-07-16 NOTE — Progress Notes (Addendum)
Pharmacy Antibiotic Note  Jasmine Briggs is a 82 y.o. female admitted on 07/10/2015 with pneumonia.  Pharmacy has been consulted for vancomycin and Zosyn dosing. 4/24 MD consulted pharmacy for Cipro dosing. Discontinuing Vancomcyin and Zosyn.  Patient was recently admitted and discharged on 4/5 with a PICC line on antibiotics for sacral decub and possible OM, UTI, and pneumonia. Patient was receiving vancomycin 500 mg IV daily PTA.  Unclear how long patient has been receiving treatment for OM.   Plan: Will order renally adjust Cipro to 250 mg PO BID for 7 days.  Continue Fluconazole 100 mg IV q24h based on renal function.  Height: 5\' 7"  (170.2 cm) Weight: 185 lb 9.6 oz (84.188 kg) IBW/kg (Calculated) : 61.6  Temp (24hrs), Avg:97.4 F (36.3 C), Min:97.4 F (36.3 C), Max:97.5 F (36.4 C)   Recent Labs Lab 07/10/15 1822  07/12/15 0644 07/13/15 0435 07/14/15 0557 07/14/15 0735 07/15/15 0635 07/15/15 0948 07/16/15 0626  WBC  --   < > 9.2 9.2 8.5  --   --  9.8 9.6  CREATININE  --   < > 1.59* 1.61* 1.70*  --   --  1.75* 1.72*  LATICACIDVEN 1.4  --   --   --   --   --   --   --   --   VANCOTROUGH  --   --   --   --   --  40*  --   --   --   VANCORANDOM  --   --   --   --   --   --  38  --   --   < > = values in this interval not displayed.  Estimated Creatinine Clearance: 23.3 mL/min (by C-G formula based on Cr of 1.72).    No Known Allergies  Antimicrobials this admission: Vancomycin 4/20 >> 4/24  Zosyn 4/20 >> 4/24 Diflucan 4/21 >>  Doxycycline 4/24 >> Cipro 4/24 >>  Dose adjustments this admission: 4/20 Vancomycin 1gm IV Q24H -> 500mg  Q24H 4/20 Zosyn 4.5gm IV Q8H -> 3.75gm IV Q8H 4/23 Vancomycin held, will redose 4/24 Vancomycin and Zosyn discontinued  Microbiology results: 4/19 BCx: pending 4/19 UCx: 50 k candida albicans 4/19 Sputum: pending   Thank you for allowing pharmacy to be a part of this patient's care.  Clarisa Schoolsrystal Blayton Huttner, PharmD Clinical  Pharmacist 07/16/2015

## 2015-07-17 LAB — CBC
HEMATOCRIT: 20.8 % — AB (ref 35.0–47.0)
HEMOGLOBIN: 6.7 g/dL — AB (ref 12.0–16.0)
MCH: 28.5 pg (ref 26.0–34.0)
MCHC: 32.4 g/dL (ref 32.0–36.0)
MCV: 88 fL (ref 80.0–100.0)
Platelets: 126 10*3/uL — ABNORMAL LOW (ref 150–440)
RBC: 2.37 MIL/uL — AB (ref 3.80–5.20)
RDW: 21.1 % — ABNORMAL HIGH (ref 11.5–14.5)
WBC: 8.2 10*3/uL (ref 3.6–11.0)

## 2015-07-17 LAB — PREPARE RBC (CROSSMATCH)

## 2015-07-17 LAB — GLUCOSE, CAPILLARY
GLUCOSE-CAPILLARY: 154 mg/dL — AB (ref 65–99)
GLUCOSE-CAPILLARY: 158 mg/dL — AB (ref 65–99)
GLUCOSE-CAPILLARY: 162 mg/dL — AB (ref 65–99)
GLUCOSE-CAPILLARY: 178 mg/dL — AB (ref 65–99)

## 2015-07-17 LAB — BASIC METABOLIC PANEL
ANION GAP: 9 (ref 5–15)
BUN: 47 mg/dL — ABNORMAL HIGH (ref 6–20)
CHLORIDE: 125 mmol/L — AB (ref 101–111)
CO2: 22 mmol/L (ref 22–32)
Calcium: 9.3 mg/dL (ref 8.9–10.3)
Creatinine, Ser: 1.71 mg/dL — ABNORMAL HIGH (ref 0.44–1.00)
GFR calc non Af Amer: 25 mL/min — ABNORMAL LOW (ref >60)
GFR, EST AFRICAN AMERICAN: 29 mL/min — AB (ref >60)
Glucose, Bld: 190 mg/dL — ABNORMAL HIGH (ref 65–99)
POTASSIUM: 3.1 mmol/L — AB (ref 3.5–5.1)
SODIUM: 156 mmol/L — AB (ref 135–145)

## 2015-07-17 LAB — MAGNESIUM: MAGNESIUM: 2.3 mg/dL (ref 1.7–2.4)

## 2015-07-17 MED ORDER — FLUCONAZOLE 100 MG PO TABS
100.0000 mg | ORAL_TABLET | Freq: Every day | ORAL | Status: DC
  Filled 2015-07-17 (×2): qty 1

## 2015-07-17 MED ORDER — SODIUM CHLORIDE 0.9 % IV SOLN: Freq: Once | INTRAVENOUS | Status: DC

## 2015-07-17 NOTE — Progress Notes (Signed)
Nutrition Follow-up     INTERVENTION:  -Recommend calorie count and discussed with MD Imogene Burnhen.   -Continue magic cup and mightyshake for added nutrition. -Kitchen to be sending thickened liquids between meals as well.      NUTRITION DIAGNOSIS:   Increased nutrient needs related to wound healing as evidenced by estimated needs.    GOAL:   Patient will meet greater than or equal to 90% of their needs    MONITOR:   PO intake, Diet advancement  REASON FOR ASSESSMENT:   Malnutrition Screening Tool    ASSESSMENT:      Jasmine Briggs with bair hugger at this time.  Noted Palliative care has signed off and family wants aggressive care.   Spoke with dtr at bedside and reports Jasmine Briggs has been eating good prior to admission, 3 meals per day was drinking supplement drinks.  Limited documentation in chart.  Dtr reports has been eating about 1 bite of each item on meal trays.  Likes magic cup.  Medications reviewed; lasix drip Labs reviewed Na 156, K 3.1, BUN 47, creatinine 1.71, glucose 190, hgb 6.7  Nutrition-Focused physical exam completed. Limited exam as dtr did not really want this writer to perform exam   Diet Order:  DIET - DYS 1 Room service appropriate?: No; Fluid consistency:: Nectar Thick  Skin:   (stage III pressure ulcer noted on coccyx)  Last BM:  4/24  Height:   Ht Readings from Last 1 Encounters:  07/10/15 5\' 7"  (1.702 m)    Weight: dtr thinks Jasmine Briggs has had wt gain (fluid wt gain??)  Wt Readings from Last 1 Encounters:  07/10/15 185 lb 9.6 oz (84.188 kg)    Ideal Body Weight:     BMI:  Body mass index is 29.06 kg/(m^2).  Estimated Nutritional Needs:   Kcal:  2100 kcals/d  Protein:  100-126 g/d  Fluid:  2 L/d  EDUCATION NEEDS:   No education needs identified at this time  Bentley Haralson B. Freida BusmanAllen, RD, LDN [phone] (pager) Weekend/On-Call pager [phone])

## 2015-07-17 NOTE — Progress Notes (Signed)
Patient receiving 1 unit of blood at this time, family at bedside, no reaction noted

## 2015-07-17 NOTE — Progress Notes (Signed)
Inpatient Diabetes Program Recommendations  AACE/ADA: New Consensus Statement on Inpatient Glycemic Control (2015)  Target Ranges:  Prepandial:   less than 140 mg/dL      Peak postprandial:   less than 180 mg/dL (1-2 hours)      Critically ill patients:  140 - 180 mg/dL   Review of Glycemic Control  Diabetes history: DM2 Outpatient Diabetes medications: None Current orders for Inpatient glycemic control: CBGs Q6H  Inpatient Diabetes Program Recommendations: Correction (SSI): Glucose is bieng monitored but no orders for Novolog correction ordered at this time. Please consider ordering CBGs with Novolog 0-9 units TID with meals.  Thanks, Jasmine PennerMarie Kedrick Mcnamee, RN, MSN, CDE Diabetes Coordinator Inpatient Diabetes Program [phone] (Team Pager from 8am to 5pm) [phone] ([facility] office) [phone] [facility] office) [phone] [facility] office)

## 2015-07-17 NOTE — Progress Notes (Signed)
Palliative Care Update  I have been following pt 'at a distance' since my consult.  It was apparent then-- and is apparent now-- that family is continuing to desire aggressive medical management.  This is going to keep pt in the hospital for the majority of her remaining days. Patient had expressed strong desires to go home and 'stay' home.  Family is trying to honor her wishes and they expect to take her home again from here.  Hospice in the home was offered (during last 2-3 hospital stays here and also when she was at outside hospitals).  But, this was declined.   I have spoken with Dr. Imogene Burnhen today and he and I agree that family is unlikely to change their wishes and that ongoing Palliative Care consultation is not indicated.   I will sign off at this time.  Please call if there are changes in what the 6 adult children desire for pt's care approach.   Cammie McgeeMargaret Ferriter Barnabas Henriques, MD Palliative Care

## 2015-07-17 NOTE — Progress Notes (Signed)
Blood transfusion completed, no adverse reaction noted, patient resting in bed, family at bedside.

## 2015-07-17 NOTE — Progress Notes (Signed)
Prime doctor paged to update on patient's temperature. Awaiting for a call back.

## 2015-07-17 NOTE — Progress Notes (Signed)
DR. Sheryle Hailiamond called back and informed of patient's temp, no new order received but to continue to use the Bair hugger machine to warm her.

## 2015-07-17 NOTE — Progress Notes (Signed)
[facility] - [place] at [facility]   PATIENT NAME: Jasmine Briggs    MR#:  [id number]  DATE OF BIRTH:  1920/10/27  SUBJECTIVE:  CHIEF COMPLAINT:   Chief Complaint  Patient presents with  . Altered Mental Status   The patient is more awake,non-communicative. A little better oral intake per her daughter. More urine output.  REVIEW OF SYSTEMS:  Unable to get ROS.  DRUG ALLERGIES:  No Known Allergies  VITALS:  Blood pressure 107/50, pulse 70, temperature 97.6 F (36.4 C), temperature source Rectal, resp. rate 20, height  (1.702 m), weight 84.188 kg (185 lb 9.6 oz), SpO2 100 %.  PHYSICAL EXAMINATION:  GENERAL:  82 y.o.-year-old patient lying in the bed with no acute distress.  EYES: blindness. HEENT: Head atraumatic, normocephalic.  NECK:  Supple, no jugular venous distention. No thyroid enlargement, no tenderness.  LUNGS: Normal breath sounds bilaterally, no wheezing, bilateral rales. No use of accessory muscles of respiration.  CARDIOVASCULAR: S1, S2 normal. No murmurs, rubs, or gallops.  ABDOMEN: Soft, nontender, nondistended. Bowel sounds present. No organomegaly or mass.  EXTREMITIES: bilateral arm and leg edema 3+, no cyanosis, or clubbing.  NEUROLOGIC: unable to exam. PSYCHIATRIC: The patient opened eyes to verbal stimuli. Noncommunicative. SKIN: Sacral DU stage 3.    LABORATORY PANEL:   CBC  Recent Labs Lab 07/17/15 0938  WBC 8.2  HGB 6.7*  HCT 20.8*  PLT 126*   ------------------------------------------------------------------------------------------------------------------  Chemistries   Recent Labs Lab 07/10/15 1821  07/17/15 0938 07/17/15 1226  NA 146*  < > 156*  --   K 4.1  < > 3.1*  --   CL 117*  < > 125*  --   CO2 18*  < > 22  --   GLUCOSE 77  < > 190*  --   BUN 30*  < > 47*  --   CREATININE 1.35*  < > 1.71*  --   CALCIUM 9.4  < > 9.3  --   MG  --   < >  --  2.3  AST 33  --   --   --   ALT 20  --   --   --    ALKPHOS 78  --   --   --   BILITOT 0.4  --   --   --   < > = values in this interval not displayed. ------------------------------------------------------------------------------------------------------------------  Cardiac Enzymes  Recent Labs Lab 07/10/15 1821  TROPONINI <0.03   ------------------------------------------------------------------------------------------------------------------  RADIOLOGY:  No results found.  EKG:   Orders placed or performed during the hospital encounter of 07/10/15  . EKG 12-Lead  . EKG 12-Lead    ASSESSMENT AND PLAN:   Acute diastolicCHF (congestive heart failure) (HCC)  She was on a low rate Lasix drip, with concurrent albumin infusion. Echocardiogram: EF: 45% - 50% . carefully diuresis following renal function and electrolytes per Jasmine Briggs,  cardiology consult. Continue Lasix drip.   Anasarca. gentle diuresis with albumin infusion as above. Lasix drip was on hold due to worsening renal function and low side blood pressure. Resumed Lasix drip per nephrology consult. Discontinued lasix drip due to worsening renal function and restarted per Jasmine Briggs.  Sacral DU stage 3, wound care.  Acute renal failure on chronic kidney disease stage III, Worsening but stable . Continue  lasix drip per Jasmine Briggs.   Hypernatremia. secondary to loop diuretics. Worsening, F/u BMP. Encourage family and the nurse to give oral water.  Hypokalemia. give KCl supplement. Follow-up level. Magnesium is normal.  History of sacral osteomyelitis, Aspiration pneumonia and UTI.She has been treated with Zosyn, hold vancomycin due to elevated level. Negative Blood cultures so far. urine cultures: CANDIDA ALBICANS. changed to oral cipro 500 bid and doxy 100 bid for #2/7 days, Continue Diflucan for #2/7 more days, She will no longer need her Central line at DC per Jasmine Briggs.  Acute metabolic encephalopathy and Dementia. Continue dysphagia 1 diet per speech  study. Aspiration precaution.   HTN (hypertension) - blood pressure borderline low, holding antihypertensives for now.  Type 2 diabetes mellitus (HCC) - sliding scale insulin with corresponding glucose checks  Anemia of chronic disease. Hb decreased from 8.1 to 7.0.  Status post 1 units PRBC transfusion, Hb increased to 9.7. No active bleeding. Hemoglobin decreased to 6.7 today. Give another 3 units of PRBC transfusion and F/u Hb.  Hematuria. Hold Lovenox. Improved.   Poor prognosis.  Jasmine Briggs had a meeting with family members. Per Jasmine Briggs, FM (3/6 children) want further treatment without hospice care or SNF placement.  I discussed with her son about the worsening condition, high risk for cardiopulmonary arrest and very poor prognosis. He discussed with other FM and they want to continue treatment.  I discussed with Jasmine Briggs and Jasmine Briggs.  Physical therapy evaluation suggested skilled nursing facility placement. All the records are reviewed and case discussed with Care Management/Social Workerr. Management plans discussed with the patient, her daughter and they are in agreement.  CODE STATUS: DNR  TOTAL TIME TAKING CARE OF THIS PATIENT: 45 minutes.  Greater than 50% time was spent on coordination of care and face-to-face counseling.  POSSIBLE D/C IN >3 DAYS, DEPENDING ON CLINICAL CONDITION.   Jasmine Pollackhen, Jasmine Briggs M.D on 07/17/2015 at 2:23 PM  Between 7am to 6pm - Pager - [phone]  After 6pm go to [email or web address] - password [email or web address] [facility]  [facility]  Office  [phone]  CC: Primary care physician; Jasmine Briggs

## 2015-07-17 NOTE — Progress Notes (Signed)
Bedside report received. Patient resting in the bed, alert unresponsive to verbal tactile Iv lasix infusing, rectal temp monitoring 96.5 at this time ,

## 2015-07-17 NOTE — Progress Notes (Signed)
Patient was cool at shift change and as such the thermometer could not read her temp. Bair Hugger warming blanket was started. Patient is warm after the machine was started but her temperature is reading low (94.5) with 2 different thermometers. Dr Loney Lohseni notified with a new order to get the dual temperature machine to check. The machine is being used and the temperature reading is now 95.2. Patient is asymptomatic, no acute distress noted. Will continue to monitor.

## 2015-07-17 NOTE — Progress Notes (Signed)
Family at bedside, patient as sleep at this time.

## 2015-07-17 NOTE — Progress Notes (Signed)
[facility]  ROUNDING NOTE   Subjective:   Urine is clear today Breathing is better Sodium remains high at 156 Serum creatinine about the same at 1.71 Patient's family members report that she was able to eat some earlier Urine output 1600 cc ast 24 hours and appears to be improving with Lasix drip  Objective:  Vital signs in last 24 hours:  Temp:  [94.5 F (34.7 C)-97.8 F (36.6 C)] 97.6 F (36.4 C) (04/26 1516) Pulse Rate:  [65-73] 70 (04/26 1516) Resp:  [10-20] 18 (04/26 1505) BP: (107-126)/(42-60) 111/55 mmHg (04/26 1516) SpO2:  [75 %-100 %] 94 % (04/26 1516)  Weight change:  Filed Weights   07/10/15 1757 07/10/15 2305  Weight: 90.719 kg (200 lb) 84.188 kg (185 lb 9.6 oz)    Intake/Output: I/O last 3 completed shifts: In: 158.3 [I.V.:158.3] Out: 2200 [Urine:2200]   Intake/Output this shift:     Physical Exam: General: NAD, laying in bed  Head: Normocephalic, atraumatic. Moist oral mucosal membranes  Eyes: Anicteric,   Neck: Supple, trachea midline  Lungs:  Diffuse mild crackles bilaterally  Heart: Irregular rhythm  Abdomen:  Soft, nontender, obese  Extremities: ++ peripheral edema.  Neurologic: Not following commands,   Skin: No lesions  GU: Foley Bonham yellow urine    Basic Metabolic Panel:  Recent Labs Lab 07/11/15 1300  07/13/15 0435 07/14/15 0557 07/15/15 0948 07/16/15 0626 07/17/15 0938 07/17/15 1226  NA  --   < > 148* 152* 154* 155* 156*  --   K  --   < > 3.2* 3.4* 3.0* 3.4* 3.1*  --   CL  --   < > 120* 123* 123* 125* 125*  --   CO2  --   < > 21* 20* 22 25 22   --   GLUCOSE  --   < > 141* 159* 136* 194* 190*  --   BUN  --   < > 37* 43* 48* 48* 47*  --   CREATININE  --   < > 1.61* 1.70* 1.75* 1.72* 1.71*  --   CALCIUM  --   < > 9.1 9.1 9.2 9.4 9.3  --   MG 2.5*  --   --   --  2.3  --   --  2.3  < > = values in this interval not displayed.  Liver Function Tests:  Recent Labs Lab 07/10/15 1821  AST 33  ALT 20  ALKPHOS  78  BILITOT 0.4  PROT 6.6  ALBUMIN 2.7*   No results for input(s): LIPASE, AMYLASE in the last 168 hours. No results for input(s): AMMONIA in the last 168 hours.  CBC:  Recent Labs Lab 07/10/15 1821  07/13/15 0435 07/14/15 0557 07/15/15 0948 07/16/15 0626 07/17/15 0938  WBC 4.5  < > 9.2 8.5 9.8 9.6 8.2  NEUTROABS 3.3  --   --   --   --   --   --   HGB 8.1*  < > 9.0* 7.9* 7.7* 7.8* 6.7*  HCT 25.5*  < > 27.1* 24.1* 23.4* 24.2* 20.8*  MCV 89.5  < > 87.5 88.2 86.9 89.3 88.0  PLT 175  < > 151 128* 127* 145* 126*  < > = values in this interval not displayed.  Cardiac Enzymes:  Recent Labs Lab 07/10/15 1821  TROPONINI <0.03    BNP: Invalid input(s): POCBNP  CBG:  Recent Labs Lab 07/16/15 0752 07/16/15 1309 07/17/15 0008 07/17/15 0604 07/17/15 1204  GLUCAP 179* 169* 162*  158* 178*    Microbiology: Results for orders placed or performed during the hospital encounter of 07/10/15  Blood culture (routine x 2)     Status: None (Preliminary result)   Collection Time: 07/10/15  6:23 PM  Result Value Ref Range Status   Specimen Description BLOOD PICC LINE  Final   Special Requests BOTTLES DRAWN AEROBIC AND ANAEROBIC  8CC  Final   Culture NO GROWTH 4 DAYS  Final   Report Status PENDING  Incomplete  Blood culture (routine x 2)     Status: None (Preliminary result)   Collection Time: 07/10/15  7:00 PM  Result Value Ref Range Status   Specimen Description BLOOD PICC LINE  Final   Special Requests BOTTLES DRAWN AEROBIC AND ANAEROBIC  5CC  Final   Culture NO GROWTH 4 DAYS  Final   Report Status PENDING  Incomplete  Urine culture     Status: Abnormal   Collection Time: 07/10/15  8:04 PM  Result Value Ref Range Status   Specimen Description URINE, RANDOM  Final   Special Requests NONE  Final   Culture 50,000 COLONIES/mL CANDIDA ALBICANS (A)  Final   Report Status 07/13/2015 FINAL  Final  Urine culture     Status: None (Preliminary result)   Collection Time: 07/15/15   3:58 PM  Result Value Ref Range Status   Specimen Description URINE, CATHETERIZED  Final   Special Requests NONE  Final   Culture HOLDING FOR POSSIBLE PATHOGEN  Final   Report Status PENDING  Incomplete    Coagulation Studies: No results for input(s): LABPROT, INR in the last 72 hours.  Urinalysis:  Recent Labs  07/15/15 1558  COLORURINE AMBER*  LABSPEC 1.010  PHURINE 5.0  GLUCOSEU NEGATIVE  HGBUR 2+*  BILIRUBINUR NEGATIVE  KETONESUR NEGATIVE  PROTEINUR 100*  NITRITE NEGATIVE  LEUKOCYTESUR 1+*      Imaging: No results found.   Medications:   . furosemide (LASIX) infusion 4 mg/hr (07/15/15 1659)   . sodium chloride   Intravenous Once  . ciprofloxacin  250 mg Oral BID  . doxycycline  100 mg Oral Q12H  . [START ON 07/18/2015] fluconazole  100 mg Oral Daily  . sodium chloride flush  10-40 mL Intracatheter Q12H  . sodium chloride flush  3 mL Intravenous Q12H   ondansetron **OR** ondansetron (ZOFRAN) IV, sodium chloride flush  Assessment/ Plan:  Ms. Ashyah Quizon is a 82 y.o. black female with dementia, hypertension, diabetes mellitus type II, gout, who was admitted to [facility] on 07/10/2015   1. Acute renal failure on chronic kidney disease stage III: due to acute exacerbation of CHF causing acute cardiorenal syndrome. Creatinine fluctuates with fluid status. Baseline most likely ~1.3 from 05/2015.  - Urine output is fair, serum creatinine 1.72  2. Hypernatremia: . - Patient needs free water replacement  - currently taking in liquids with Thick-It because of difficulty swallowing    3. Acute exacerbation of systolic congestive heart failure: EF of 40-45%.  With anasarca with third spacing. And hypoalbuminia.  - Restart Lasix drip to address pulmonary edema   4. Chronic sacral osteomyelitis:  Antibiotics per primary team  5. Gross hematuria - Hold Lovenox - urine appears to be clearing     LOS: 7 Kischa Altice 4/26/20174:38 PM

## 2015-07-18 LAB — GLUCOSE, CAPILLARY
GLUCOSE-CAPILLARY: 158 mg/dL — AB (ref 65–99)
GLUCOSE-CAPILLARY: 221 mg/dL — AB (ref 65–99)
Glucose-Capillary: 150 mg/dL — ABNORMAL HIGH (ref 65–99)
Glucose-Capillary: 156 mg/dL — ABNORMAL HIGH (ref 65–99)
Glucose-Capillary: 182 mg/dL — ABNORMAL HIGH (ref 65–99)

## 2015-07-18 LAB — BASIC METABOLIC PANEL
ANION GAP: 11 (ref 5–15)
BUN: 47 mg/dL — ABNORMAL HIGH (ref 6–20)
CHLORIDE: 125 mmol/L — AB (ref 101–111)
CO2: 22 mmol/L (ref 22–32)
Calcium: 9.4 mg/dL (ref 8.9–10.3)
Creatinine, Ser: 1.76 mg/dL — ABNORMAL HIGH (ref 0.44–1.00)
GFR calc non Af Amer: 24 mL/min — ABNORMAL LOW (ref >60)
GFR, EST AFRICAN AMERICAN: 28 mL/min — AB (ref >60)
Glucose, Bld: 184 mg/dL — ABNORMAL HIGH (ref 65–99)
POTASSIUM: 2.9 mmol/L — AB (ref 3.5–5.1)
SODIUM: 158 mmol/L — AB (ref 135–145)

## 2015-07-18 LAB — CBC
HEMATOCRIT: 26.4 % — AB (ref 35.0–47.0)
HEMOGLOBIN: 8.7 g/dL — AB (ref 12.0–16.0)
MCH: 28.2 pg (ref 26.0–34.0)
MCHC: 32.8 g/dL (ref 32.0–36.0)
MCV: 85.9 fL (ref 80.0–100.0)
PLATELETS: 105 10*3/uL — AB (ref 150–440)
RBC: 3.07 MIL/uL — AB (ref 3.80–5.20)
RDW: 18.4 % — ABNORMAL HIGH (ref 11.5–14.5)
WBC: 10.5 10*3/uL (ref 3.6–11.0)

## 2015-07-18 LAB — PHOSPHORUS: PHOSPHORUS: 2.9 mg/dL (ref 2.5–4.6)

## 2015-07-18 LAB — POTASSIUM: Potassium: 3.6 mmol/L (ref 3.5–5.1)

## 2015-07-18 LAB — TYPE AND SCREEN
ABO/RH(D): O POS
Antibody Screen: NEGATIVE
Unit division: 0

## 2015-07-18 LAB — MAGNESIUM
MAGNESIUM: 2 mg/dL (ref 1.7–2.4)
Magnesium: 2.1 mg/dL (ref 1.7–2.4)

## 2015-07-18 MED ORDER — FLUCONAZOLE IN SODIUM CHLORIDE 100-0.9 MG/50ML-% IV SOLN
100.0000 mg | INTRAVENOUS | Status: AC
  Administered 2015-07-18 – 2015-07-21 (×4): 100 mg via INTRAVENOUS
  Filled 2015-07-18 (×4): qty 50

## 2015-07-18 MED ORDER — DOXYCYCLINE HYCLATE 100 MG IV SOLR
100.0000 mg | Freq: Two times a day (BID) | INTRAVENOUS | Status: AC
  Administered 2015-07-18 – 2015-07-22 (×9): 100 mg via INTRAVENOUS
  Filled 2015-07-18 (×10): qty 100

## 2015-07-18 MED ORDER — CIPROFLOXACIN IN D5W 200 MG/100ML IV SOLN
200.0000 mg | Freq: Two times a day (BID) | INTRAVENOUS | Status: AC
  Administered 2015-07-18 – 2015-07-22 (×9): 200 mg via INTRAVENOUS
  Filled 2015-07-18 (×9): qty 100

## 2015-07-18 MED ORDER — IPRATROPIUM-ALBUTEROL 0.5-2.5 (3) MG/3ML IN SOLN
3.0000 mL | Freq: Four times a day (QID) | RESPIRATORY_TRACT | Status: DC | PRN
  Administered 2015-07-18: 3 mL via RESPIRATORY_TRACT
  Filled 2015-07-18: qty 3

## 2015-07-18 MED ORDER — POTASSIUM CHLORIDE 10 MEQ/100ML IV SOLN
10.0000 meq | INTRAVENOUS | Status: AC
  Administered 2015-07-18 (×4): 10 meq via INTRAVENOUS
  Filled 2015-07-18 (×4): qty 100

## 2015-07-18 MED ORDER — DEXTROSE 5 % IV SOLN
INTRAVENOUS | Status: DC
  Administered 2015-07-18 (×2): via INTRAVENOUS

## 2015-07-18 MED ORDER — POTASSIUM CHLORIDE CRYS ER 20 MEQ PO TBCR
30.0000 meq | EXTENDED_RELEASE_TABLET | Freq: Two times a day (BID) | ORAL | Status: DC
  Filled 2015-07-18: qty 1

## 2015-07-18 NOTE — Progress Notes (Signed)
[facility]  ROUNDING NOTE   Subjective:   Breathing is better Sodium remains high at 158 Serum creatinine about the same at 1.76  Urine output 950 cc ast 24 hours  Continued on Lasix drip  Objective:  Vital signs in last 24 hours:  Temp:  [97.5 F (36.4 C)-98.4 F (36.9 C)] 98.3 F (36.8 C) (04/27 0505) Pulse Rate:  [69-77] 71 (04/27 1109) Resp:  [16-20] 16 (04/27 1109) BP: (111-134)/(42-76) 123/60 mmHg (04/27 1109) SpO2:  [75 %-100 %] 100 % (04/27 1109)  Weight change:  Filed Weights   07/10/15 1757 07/10/15 2305  Weight: 90.719 kg (200 lb) 84.188 kg (185 lb 9.6 oz)    Intake/Output: I/O last 3 completed shifts: In: 599.3 [I.V.:148.3; Blood:451] Out: 1550 [Urine:1550]   Intake/Output this shift:     Physical Exam: General: NAD, laying in bed  Head: Normocephalic, atraumatic. Moist oral mucosal membranes  Eyes: Anicteric,   Neck: Supple, trachea midline  Lungs:  Diffuse mild crackles bilaterally  Heart: Irregular rhythm  Abdomen:  Soft, nontender, obese  Extremities: ++ generalized edema.  Neurologic: Not following commands,   Skin: No lesions  GU: Foley Devers yellow urine    Basic Metabolic Panel:  Recent Labs Lab 07/14/15 0557 07/15/15 0948 07/16/15 0626 07/17/15 0938 07/17/15 1226 07/18/15 0500  NA 152* 154* 155* 156*  --  158*  K 3.4* 3.0* 3.4* 3.1*  --  2.9*  CL 123* 123* 125* 125*  --  125*  CO2 20* 22 25 22   --  22  GLUCOSE 159* 136* 194* 190*  --  184*  BUN 43* 48* 48* 47*  --  47*  CREATININE 1.70* 1.75* 1.72* 1.71*  --  1.76*  CALCIUM 9.1 9.2 9.4 9.3  --  9.4  MG  --  2.3  --   --  2.3  --     Liver Function Tests: No results for input(s): AST, ALT, ALKPHOS, BILITOT, PROT, ALBUMIN in the last 168 hours. No results for input(s): LIPASE, AMYLASE in the last 168 hours. No results for input(s): AMMONIA in the last 168 hours.  CBC:  Recent Labs Lab 07/14/15 0557 07/15/15 0948 07/16/15 0626 07/17/15 0938  07/18/15 0500  WBC 8.5 9.8 9.6 8.2 10.5  HGB 7.9* 7.7* 7.8* 6.7* 8.7*  HCT 24.1* 23.4* 24.2* 20.8* 26.4*  MCV 88.2 86.9 89.3 88.0 85.9  PLT 128* 127* 145* 126* 105*    Cardiac Enzymes: No results for input(s): CKTOTAL, CKMB, CKMBINDEX, TROPONINI in the last 168 hours.  BNP: Invalid input(s): POCBNP  CBG:  Recent Labs Lab 07/17/15 2114 07/18/15 0108 07/18/15 0638 07/18/15 0731 07/18/15 1109  GLUCAP 154* 150* 156* 158* 182*    Microbiology: Results for orders placed or performed during the hospital encounter of 07/10/15  Blood culture (routine x 2)     Status: None (Preliminary result)   Collection Time: 07/10/15  6:23 PM  Result Value Ref Range Status   Specimen Description BLOOD PICC LINE  Final   Special Requests BOTTLES DRAWN AEROBIC AND ANAEROBIC  8CC  Final   Culture NO GROWTH 4 DAYS  Final   Report Status PENDING  Incomplete  Blood culture (routine x 2)     Status: None (Preliminary result)   Collection Time: 07/10/15  7:00 PM  Result Value Ref Range Status   Specimen Description BLOOD PICC LINE  Final   Special Requests BOTTLES DRAWN AEROBIC AND ANAEROBIC  5CC  Final   Culture NO GROWTH 4 DAYS  Final   Report Status PENDING  Incomplete  Urine culture     Status: Abnormal   Collection Time: 07/10/15  8:04 PM  Result Value Ref Range Status   Specimen Description URINE, RANDOM  Final   Special Requests NONE  Final   Culture 50,000 COLONIES/mL CANDIDA ALBICANS (A)  Final   Report Status 07/13/2015 FINAL  Final  Urine culture     Status: Abnormal (Preliminary result)   Collection Time: 07/15/15  3:58 PM  Result Value Ref Range Status   Specimen Description URINE, CATHETERIZED  Final   Special Requests NONE  Final   Culture (A)  Final    20,000 COLONIES/mL YEAST IDENTIFICATION TO FOLLOW    Report Status PENDING  Incomplete    Coagulation Studies: No results for input(s): LABPROT, INR in the last 72 hours.  Urinalysis:  Recent Labs  07/15/15 1558   COLORURINE AMBER*  LABSPEC 1.010  PHURINE 5.0  GLUCOSEU NEGATIVE  HGBUR 2+*  BILIRUBINUR NEGATIVE  KETONESUR NEGATIVE  PROTEINUR 100*  NITRITE NEGATIVE  LEUKOCYTESUR 1+*      Imaging: No results found.   Medications:   . dextrose Stopped (07/18/15 1112)  . furosemide (LASIX) infusion Stopped (07/18/15 1113)   . sodium chloride   Intravenous Once  . ciprofloxacin  250 mg Oral BID  . doxycycline  100 mg Oral Q12H  . fluconazole  100 mg Oral Daily  . potassium chloride  10 mEq Intravenous Q1 Hr x 4  . sodium chloride flush  10-40 mL Intracatheter Q12H  . sodium chloride flush  3 mL Intravenous Q12H   ondansetron **OR** ondansetron (ZOFRAN) IV, sodium chloride flush  Assessment/ Plan:  [name] is a 82 y.o. black female with dementia, hypertension, diabetes mellitus type II, gout, who was [facility]   1. Acute renal failure on chronic kidney disease stage III: due to acute exacerbation of CHF causing acute cardiorenal syndrome. Creatinine fluctuates with fluid status. Baseline most likely ~1.3 from 05/2015.  - Urine output is fair, serum creatinine 1.72  2. Hypernatremia: . - Patient needs free water replacement  - currently taking in liquids with Thick-It because of difficulty swallowing   - start D5 at low dose  3. Acute exacerbation of systolic congestive heart failure: EF of 40-45%.  With anasarca with third spacing. And hypoalbuminia.  - continue Lasix drip to address pulmonary edema   4. Chronic sacral osteomyelitis:  Antibiotics per primary team  5. Gross hematuria - Hold Lovenox - urine appears to be clearing  6.  Hypokalemia IV replacement    LOS: 8 Jasmine Briggs 4/27/20172:26 PM

## 2015-07-18 NOTE — Progress Notes (Signed)
[facility] - [place] at [facility]   PATIENT NAME: Jasmine CaraSarah Broce    MR#:  [id number]  DATE OF BIRTH:  [birth year]  SUBJECTIVE:  CHIEF COMPLAINT:   Chief Complaint  Patient presents with  . Altered Mental Status   The patient is lethargic,non-communicative. Still poor oral intake, fair urine output.  REVIEW OF SYSTEMS:  Unable to get ROS.  DRUG ALLERGIES:  No Known Allergies  VITALS:  Blood pressure 123/60, pulse 71, temperature 98.3 F (36.8 C), temperature source Oral, resp. rate 16, height 5\' 7"  (1.702 m), weight 84.188 kg (185 lb 9.6 oz), SpO2 100 %.  PHYSICAL EXAMINATION:  GENERAL:  82 y.o.-year-old patient lying in the bed with no acute distress.  EYES: blindness. HEENT: Head atraumatic, normocephalic.  NECK:  Supple, no jugular venous distention. No thyroid enlargement, no tenderness.  LUNGS: Normal breath sounds bilaterally, no wheezing, bilateral rales. No use of accessory muscles of respiration.  CARDIOVASCULAR: S1, S2 normal. No murmurs, rubs, or gallops.  ABDOMEN: Soft, nontender, nondistended. Bowel sounds present. No organomegaly or mass.  EXTREMITIES: bilateral arm and leg edema 3+, no cyanosis, or clubbing.  NEUROLOGIC: unable to exam. PSYCHIATRIC: The patient opened eyes to verbal stimuli. Noncommunicative. SKIN: Sacral DU stage 3.    LABORATORY PANEL:   CBC  Recent Labs Lab 07/18/15 0500  WBC 10.5  HGB 8.7*  HCT 26.4*  PLT 105*   ------------------------------------------------------------------------------------------------------------------  Chemistries   Recent Labs Lab 07/17/15 1226 07/18/15 0500  NA  --  158*  K  --  2.9*  CL  --  125*  CO2  --  22  GLUCOSE  --  184*  BUN  --  47*  CREATININE  --  1.76*  CALCIUM  --  9.4  MG 2.3  --    ------------------------------------------------------------------------------------------------------------------  Cardiac Enzymes No results for input(s): TROPONINI in  the last 168 hours. ------------------------------------------------------------------------------------------------------------------  RADIOLOGY:  No results found.  EKG:   Orders placed or performed during the hospital encounter of 07/10/15  . EKG 12-Lead  . EKG 12-Lead    ASSESSMENT AND PLAN:   Acute diastolicCHF (congestive heart failure) (HCC)  She was on a low rate Lasix drip, with concurrent albumin infusion. Echocardiogram: EF: 45% - 50% . carefully diuresis following renal function and electrolytes per Dr. Lady GaryFath,  cardiology consult. Continue Lasix drip.   Anasarca. gentle diuresis with albumin infusion as above. Lasix drip was on hold due to worsening renal function and low side blood pressure. Resumed Lasix drip per nephrology consult. Discontinued lasix drip due to worsening renal function and restarted per Dr. Dr. Thedore MinsSingh.  Sacral DU stage 3, wound care.  Acute renal failure on chronic kidney disease stage III, Worsening but stable . Continue  lasix drip per Dr. Thedore MinsSingh.   Hypernatremia. secondary to loop diuretics. Worsening, F/u BMP. Encourage family and the nurse to give oral water. I discussed with the patient's son about options for NGT or PEG tube placement for feeding and the treatment of hypernatremia. He'll discuss with other family members.  Hypokalemia. give KCl supplement. Follow-up level. Magnesium is normal.  History of sacral osteomyelitis, Aspiration pneumonia and UTI.She has been treated with Zosyn, hold vancomycin due to elevated level. Negative Blood cultures so far. urine cultures: CANDIDA ALBICANS. changed to oral cipro 500 bid and doxy 100 bid for #3/7 days, Continue Diflucan for #3/7 more days, She will no longer need her Central line at DC per Dr. Sampson GoonFitzgerald.  Acute metabolic encephalopathy and Dementia.  Continue dysphagia 1 diet per speech study. Aspiration precaution.   HTN (hypertension) - blood pressure borderline low, holding  antihypertensives for now.  Type 2 diabetes mellitus (HCC) - sliding scale insulin with corresponding glucose checks  Anemia of chronic disease. Hb decreased from 8.1 to 7.0.  Status post 1 units PRBC transfusion, Hb increased to 9.7. No active bleeding. Hemoglobin decreased to 6.7, she was given another 1 units of PRBC transfusion and Hb up to 8.7.  Hematuria. Hold Lovenox. Improved.   Poor prognosis.  Dr. Orvan Falconer had a meeting with family members. Per Dr. Orvan Falconer, FM (3/6 children) want further treatment without hospice care or SNF placement.  I discussed with her son about the worsening condition, high risk for cardiopulmonary arrest and very poor prognosis. He discussed with other FM and they want to continue treatment.  I discussed with Dr. Thedore Mins.  Physical therapy evaluation suggested skilled nursing facility placement. All the records are reviewed and case discussed with Care Management/Social Workerr. Management plans discussed with the patient, her son and they are in agreement.  CODE STATUS: DNR  TOTAL TIME TAKING CARE OF THIS PATIENT: 45 minutes.  Greater than 50% time was spent on coordination of care and face-to-face counseling.  POSSIBLE D/C IN >3 DAYS, DEPENDING ON CLINICAL CONDITION.   Shaune Pollack M.D on 07/18/2015 at 11:34 AM  Between 7am to 6pm - Pager - [phone]  After 6pm go to [email or web address] - password [email or web address] [facility]  [place] [facility]  Office  [phone]  CC: Primary care physician; Kurtis Bushman, MD

## 2015-07-18 NOTE — Care Management (Signed)
Not progressing. Lasix gtt. D% added for [id number]. Poor intake . Prognosis poor. Family wants aggressive care.

## 2015-07-18 NOTE — Progress Notes (Signed)
Pt too lethargic and does not wake up enough to safely take crushed PO medications with applesauce. Dr. Imogene Burnhen paged and aware, MD to change potassium orders to IV since K was 2.9 this morning.

## 2015-07-18 NOTE — Progress Notes (Addendum)
Electrolyte CONSULT NOTE - INITIAL  Pharmacy Consult for Electrolyte supplementation   No Known Allergies  Patient Measurements: Height: 5\' 7"  (170.2 cm) Weight: 185 lb 9.6 oz (84.188 kg) IBW/kg (Calculated) : 61.6  Vital Signs: Temp: 98.6 F (37 C) (04/27 1953) BP: 117/69 mmHg (04/27 1953) Pulse Rate: 69 (04/27 1953) Intake/Output from previous day: 04/26 0701 - 04/27 0700 In: 451 [Blood:451] Out: 950 [Urine:950] Intake/Output from this shift:    Labs:  Recent Labs  07/16/15 0626 07/17/15 0938 07/18/15 0500  WBC 9.6 8.2 10.5  HGB 7.8* 6.7* 8.7*  HCT 24.2* 20.8* 26.4*  PLT 145* 126* 105*     Recent Labs  07/16/15 0626 07/17/15 0938 07/17/15 1226 07/18/15 0500 07/18/15 1819  NA 155* 156*  --  158*  --   K 3.4* 3.1*  --  2.9* 3.6  CL 125* 125*  --  125*  --   CO2 25 22  --  22  --   GLUCOSE 194* 190*  --  184*  --   BUN 48* 47*  --  47*  --   CREATININE 1.72* 1.71*  --  1.76*  --   CALCIUM 9.4 9.3  --  9.4  --   MG  --   --  2.3 2.1  --   PHOS  --   --   --  2.9  --    Estimated Creatinine Clearance: 22.7 mL/min (by C-G formula based on Cr of 1.76).    Recent Labs  07/18/15 0731 07/18/15 1109 07/18/15 1822  GLUCAP 158* 182* 221*    Medical History: Past Medical History  Diagnosis Date  . Dementia   . Osteomyelitis (HCC)     sacral wound  . Hypertension   . Arthritis   . Diabetes mellitus without complication (HCC)   . Gout     Medications:  Scheduled:  . sodium chloride   Intravenous Once  . ciprofloxacin  200 mg Intravenous Q12H  . doxycycline (VIBRAMYCIN) IV  100 mg Intravenous Q12H  . fluconazole (DIFLUCAN) IV  100 mg Intravenous Q24H  . sodium chloride flush  10-40 mL Intracatheter Q12H  . sodium chloride flush  3 mL Intravenous Q12H   Infusions:  . dextrose 50 mL/hr at 07/18/15 1550  . furosemide (LASIX) infusion 4 mg/hr (07/18/15 1944)    Assessment: Pharmacy consulted to monitor and supplement electrolytes in a 82 yo  female admitted with ulcers.  Patient with CHF and on furosemide infusion. Scr worse today at 1.76.  Patient too lethargic per notes and discussion with MD to take PO meds.  K: 2.9, Mag: pending, Phos: pending  Plan:  MD ordered KCl 10 meq IV x 4.  Will check add on magnesium and phosphorus.  Will recheck potassium at 1800 as she will likely require additional supplementation.    Of note, per MD, patient is to be evaluated for possible PEG tube for nutritional purposes.   4/27 @ 18:00:   K  = 3.6, Mag = 2.0  No further supplementation needed.  Will recheck electrolytes on 4/28 with AM labs.   Pharmacy will continue to follow.  Sammye Staff D 07/18/2015,8:57 PM

## 2015-07-18 NOTE — Progress Notes (Signed)
Calorie Count Note  Calorie count ordered.  Diet: dysphagia 1 with nectar thick liquids per SLP Supplements: mightyshake and magic cup  Breakfast: 4/26 100% nectar thick orange juice 70 kcals and 1/4 cup magic cup 73 kcals and 2.25 g of protein Lunch: 4/26 120ml thickened water 0 kcals Dinner: 4/26 2 spoonfuls mashed potatoes 20 kcals, 30% thickened cranberry juice 20 kcals, 50% migthyshake 100 kcals and 3 gm of protein   Total intake: 283 kcal (13% of minimum estimated needs)  5.25 g protein (5% of minimum estimated needs)  Nutrition Dx: Increased nutrient needs related to wound healing as evidenced by estimated needs  Goal: Pt will meet greater than or equal to 90% of their needs Not meeting nutritional goals at this time  Intervention: continue calorie count at this time.  Noted MD Imogene Burnhen discussed NG tube, PEG with family and family to decide poc.   Medications reviewed D5 at 6050ml/hr started per nephrology noted   Archit Leger B. Freida BusmanAllen, RD, LDN [phone] (pager) Weekend/On-Call pager [phone])

## 2015-07-18 NOTE — Progress Notes (Signed)
Electrolyte CONSULT NOTE - INITIAL  Pharmacy Consult for Electrolyte supplementation   No Known Allergies  Patient Measurements: Height: 5\' 7"  (170.2 cm) Weight: 185 lb 9.6 oz (84.188 kg) IBW/kg (Calculated) : 61.6  Vital Signs: Temp: 98.3 F (36.8 C) (04/27 0505) Temp Source: Oral (04/27 0505) BP: 123/60 mmHg (04/27 1109) Pulse Rate: 71 (04/27 1109) Intake/Output from previous day: 04/26 0701 - 04/27 0700 In: 451 [Blood:451] Out: 950 [Urine:950] Intake/Output from this shift:    Labs:  Recent Labs  07/16/15 0626 07/17/15 0938 07/18/15 0500  WBC 9.6 8.2 10.5  HGB 7.8* 6.7* 8.7*  HCT 24.2* 20.8* 26.4*  PLT 145* 126* 105*     Recent Labs  07/16/15 0626 07/17/15 0938 07/17/15 1226 07/18/15 0500  NA 155* 156*  --  158*  K 3.4* 3.1*  --  2.9*  CL 125* 125*  --  125*  CO2 25 22  --  22  GLUCOSE 194* 190*  --  184*  BUN 48* 47*  --  47*  CREATININE 1.72* 1.71*  --  1.76*  CALCIUM 9.4 9.3  --  9.4  MG  --   --  2.3  --    Estimated Creatinine Clearance: 22.7 mL/min (by C-G formula based on Cr of 1.76).    Recent Labs  07/18/15 0638 07/18/15 0731 07/18/15 1109  GLUCAP 156* 158* 182*    Medical History: Past Medical History  Diagnosis Date  . Dementia   . Osteomyelitis (HCC)     sacral wound  . Hypertension   . Arthritis   . Diabetes mellitus without complication (HCC)   . Gout     Medications:  Scheduled:  . sodium chloride   Intravenous Once  . ciprofloxacin  250 mg Oral BID  . doxycycline  100 mg Oral Q12H  . fluconazole  100 mg Oral Daily  . potassium chloride  10 mEq Intravenous Q1 Hr x 4  . sodium chloride flush  10-40 mL Intracatheter Q12H  . sodium chloride flush  3 mL Intravenous Q12H   Infusions:  . dextrose Stopped (07/18/15 1112)  . furosemide (LASIX) infusion Stopped (07/18/15 1113)    Assessment: Pharmacy consulted to monitor and supplement electrolytes in a 82 yo female admitted with ulcers.  Patient with CHF and on  furosemide infusion. Scr worse today at 1.76.  Patient too lethargic per notes and discussion with MD to take PO meds.  K: 2.9, Mag: pending, Phos: pending  Plan:  MD ordered KCl 10 meq IV x 4.  Will check add on magnesium and phosphorus.  Will recheck potassium at 1800 as she will likely require additional supplementation.    Of note, per MD, patient is to be evaluated for possible PEG tube for nutritional purposes.   Pharmacy will continue to follow.  Alver Leete G 07/18/2015,2:25 PM

## 2015-07-18 NOTE — Care Management Important Message (Signed)
Important Message  Patient Details  Name: Jasmine Briggs MRN: [id number] Date of Birth: 1921-01-16   Medicare Important Message Given:  Yes    Olegario MessierKathy A Chardae Mulkern 07/18/2015, 1:27 PM

## 2015-07-19 LAB — CBC
HEMATOCRIT: 26.7 % — AB (ref 35.0–47.0)
HEMOGLOBIN: 8.7 g/dL — AB (ref 12.0–16.0)
MCH: 29 pg (ref 26.0–34.0)
MCHC: 32.7 g/dL (ref 32.0–36.0)
MCV: 88.5 fL (ref 80.0–100.0)
PLATELETS: 124 10*3/uL — AB (ref 150–440)
RBC: 3.02 MIL/uL — ABNORMAL LOW (ref 3.80–5.20)
RDW: 18.7 % — AB (ref 11.5–14.5)
WBC: 10.2 10*3/uL (ref 3.6–11.0)

## 2015-07-19 LAB — GLUCOSE, CAPILLARY
GLUCOSE-CAPILLARY: 281 mg/dL — AB (ref 65–99)
GLUCOSE-CAPILLARY: 176 mg/dL — AB (ref 65–99)
Glucose-Capillary: 161 mg/dL — ABNORMAL HIGH (ref 65–99)
Glucose-Capillary: 188 mg/dL — ABNORMAL HIGH (ref 65–99)
Glucose-Capillary: 238 mg/dL — ABNORMAL HIGH (ref 65–99)
Glucose-Capillary: 279 mg/dL — ABNORMAL HIGH (ref 65–99)
Glucose-Capillary: 285 mg/dL — ABNORMAL HIGH (ref 65–99)

## 2015-07-19 LAB — BASIC METABOLIC PANEL
ANION GAP: 8 (ref 5–15)
BUN: 44 mg/dL — ABNORMAL HIGH (ref 6–20)
CO2: 22 mmol/L (ref 22–32)
Calcium: 9.2 mg/dL (ref 8.9–10.3)
Chloride: 123 mmol/L — ABNORMAL HIGH (ref 101–111)
Creatinine, Ser: 1.74 mg/dL — ABNORMAL HIGH (ref 0.44–1.00)
GFR calc Af Amer: 28 mL/min — ABNORMAL LOW (ref >60)
GFR, EST NON AFRICAN AMERICAN: 24 mL/min — AB (ref >60)
GLUCOSE: 332 mg/dL — AB (ref 65–99)
POTASSIUM: 3.1 mmol/L — AB (ref 3.5–5.1)
SODIUM: 153 mmol/L — AB (ref 135–145)

## 2015-07-19 LAB — URINE CULTURE: Culture: 20000 — AB

## 2015-07-19 LAB — POTASSIUM: POTASSIUM: 3.6 mmol/L (ref 3.5–5.1)

## 2015-07-19 MED ORDER — POTASSIUM CL IN DEXTROSE 5% 20 MEQ/L IV SOLN
20.0000 meq | INTRAVENOUS | Status: DC
  Administered 2015-07-19 – 2015-07-23 (×5): 20 meq via INTRAVENOUS
  Filled 2015-07-19 (×6): qty 1000

## 2015-07-19 MED ORDER — INSULIN ASPART 100 UNIT/ML ~~LOC~~ SOLN: 0.0000 [IU] | Freq: Every day | SUBCUTANEOUS | Status: DC

## 2015-07-19 MED ORDER — POTASSIUM CHLORIDE 10 MEQ/100ML IV SOLN
10.0000 meq | INTRAVENOUS | Status: AC
  Administered 2015-07-19 (×4): 10 meq via INTRAVENOUS
  Filled 2015-07-19 (×4): qty 100

## 2015-07-19 MED ORDER — INSULIN ASPART 100 UNIT/ML ~~LOC~~ SOLN
0.0000 [IU] | Freq: Three times a day (TID) | SUBCUTANEOUS | Status: DC
  Administered 2015-07-19 (×2): 5 [IU] via SUBCUTANEOUS
  Administered 2015-07-19 – 2015-07-20 (×2): 2 [IU] via SUBCUTANEOUS
  Administered 2015-07-20 – 2015-07-22 (×3): 1 [IU] via SUBCUTANEOUS
  Filled 2015-07-19: qty 1
  Filled 2015-07-19: qty 2
  Filled 2015-07-19: qty 1
  Filled 2015-07-19: qty 2
  Filled 2015-07-19: qty 1
  Filled 2015-07-19 (×2): qty 5

## 2015-07-19 MED ORDER — INSULIN DETEMIR 100 UNIT/ML ~~LOC~~ SOLN
5.0000 [IU] | Freq: Every day | SUBCUTANEOUS | Status: DC
  Administered 2015-07-19: 5 [IU] via SUBCUTANEOUS
  Filled 2015-07-19 (×2): qty 0.05

## 2015-07-19 NOTE — Progress Notes (Signed)
Pharmacy Consult for Electrolyte supplementation  Labs:  Recent Labs  07/17/15 0938 07/18/15 0500 07/19/15 0632  WBC 8.2 10.5 10.2  HGB 6.7* 8.7* 8.7*  HCT 20.8* 26.4* 26.7*  PLT 126* 105* 124*     Recent Labs  07/17/15 0938 07/17/15 1226 07/18/15 0500 07/18/15 1819 07/18/15 1822 07/19/15 0632  NA 156*  --  158*  --   --  153*  K 3.1*  --  2.9* 3.6  --  3.1*  CL 125*  --  125*  --   --  123*  CO2 22  --  22  --   --  22  GLUCOSE 190*  --  184*  --   --  332*  BUN 47*  --  47*  --   --  44*  CREATININE 1.71*  --  1.76*  --   --  1.74*  CALCIUM 9.3  --  9.4  --   --  9.2  MG  --  2.3 2.1  --  2.0  --   PHOS  --   --  2.9  --   --   --    Estimated Creatinine Clearance: 23 mL/min (by C-G formula based on Cr of 1.74).  Assessment: Pharmacy consulted to monitor and supplement electrolytes in a 82 yo female admitted with ulcers.  Patient with CHF and on furosemide infusion. Scr worse today at 1.76.  Patient too lethargic per notes and discussion with MD to take PO meds.  Of note, per MD, patient is to be evaluated for possible PEG tube for nutritional purposes.   Plan:  Patient remains on lasix drip, not taking PO meds. Potassium 3.1. Will order KCl 40 mEq IV and recheck potassium this evening.  Ahlivia Salahuddin C 07/19/2015,9:39 AM

## 2015-07-19 NOTE — Progress Notes (Signed)
Calorie Count Note  Calorie count ordered.  Diet: dysphagia 1 with nectar thick liquids Supplements: mightyshake and magic cup  Breakfast: 4/28 ate 1/2 of puree eggs (8 kcals and 2 gm or protein, 1/4 oatmeal 45 kcals and 1.5 g of protein, 1/2 thickened juice 30 kcals (too lethargic to eat more) Lunch: 4/27 1/2 puree [place] 50 kcals, 8 gm protein, 1/2 mashed potatoes 61.5 kcals, 1.5 g of protein, 1 juices 60 kcals, 2 water 10 kcals, 1/2 magic cup 145 kcals 4.5 g of protein, 1/2 mightyshake 100 kcals 2 g of protein, 2 juices 120 kcals Dinner: no tray ticket found (pm snack noted 2 waters 10 kcals and 1 juice 60 kcals)  Total intake: 699 kcal (33% of minimum estimated needs)  19.5 g protein (19 % of minimum estimated needs)  Nutrition Dx: Increased nutrient needs related to wound healing as evidenced by estimated needs  Goal: Patient will meet greater than or equal to 90% of needs  Intervention:   -Continue supplements and encouraging pt to eat -Paged Dr Imogene Burnhen to discuss calorie count results and pt not meeting nutritional needs.  MD wants to continue calorie count through the weekend and family to decide on PEG first of the week.   -Noted D5 continues, Na elevated.    Mariza Bourget B. Freida BusmanAllen, RD, LDN [phone] (pager) Weekend/On-Call pager [phone])

## 2015-07-19 NOTE — Progress Notes (Signed)
Pharmacy Consult for Electrolyte supplementation  Labs:  Recent Labs  07/17/15 0938 07/18/15 0500 07/19/15 0632  WBC 8.2 10.5 10.2  HGB 6.7* 8.7* 8.7*  HCT 20.8* 26.4* 26.7*  PLT 126* 105* 124*     Recent Labs  07/17/15 0938 07/17/15 1226 07/18/15 0500 07/18/15 1819 07/18/15 1822 07/19/15 0632 07/19/15 2302  NA 156*  --  158*  --   --  153*  --   K 3.1*  --  2.9* 3.6  --  3.1* 3.6  CL 125*  --  125*  --   --  123*  --   CO2 22  --  22  --   --  22  --   GLUCOSE 190*  --  184*  --   --  332*  --   BUN 47*  --  47*  --   --  44*  --   CREATININE 1.71*  --  1.76*  --   --  1.74*  --   CALCIUM 9.3  --  9.4  --   --  9.2  --   MG  --  2.3 2.1  --  2.0  --   --   PHOS  --   --  2.9  --   --   --   --    Estimated Creatinine Clearance: 23 mL/min (by C-G formula based on Cr of 1.74).  Assessment: Pharmacy consulted to monitor and supplement electrolytes in a 82 yo female admitted with ulcers.  Patient with CHF and on furosemide infusion. Scr worse today at 1.76.  Patient too lethargic per notes and discussion with MD to take PO meds.  Of note, per MD, patient is to be evaluated for possible PEG tube for nutritional purposes.   Plan:  K 3.6 this evening. No further supplement warranted at this time. Will recheck tomorrow morning.   Carola FrostNathan A Huntley Knoop, Pharm.D., BCPS Clinical Pharmacist 07/19/2015,11:45 PM

## 2015-07-19 NOTE — Progress Notes (Signed)
[facility] [place] [facility]   PATIENT NAME: Jasmine Briggs    MR#:  [id number]  DATE OF BIRTH:  04-16-1920  SUBJECTIVE:  CHIEF COMPLAINT:   Chief Complaint  Patient presents with  . Altered Mental Status   The patient is lethargic, non-communicative. Still poor oral intake per RN, but daughter said oral intake is better. fair urine output.  REVIEW OF SYSTEMS:  Unable to get ROS.  DRUG ALLERGIES:  No Known Allergies  VITALS:  Blood pressure 92/58, pulse 70, temperature 97.6 F (36.4 C), temperature source Oral, resp. rate 18, height  (1.702 m), weight 84.188 kg (185 lb 9.6 oz), SpO2 96 %.  PHYSICAL EXAMINATION:  GENERAL:  82 y.o.-year-old patient lying in the bed with no acute distress.  EYES: blindness. HEENT: Head atraumatic, normocephalic.  NECK:  Supple, no jugular venous distention. No thyroid enlargement, no tenderness.  LUNGS: Normal breath sounds bilaterally, no wheezing, bilateral rales. No use of accessory muscles of respiration.  CARDIOVASCULAR: S1, S2 normal. No murmurs, rubs, or gallops.  ABDOMEN: Soft, nontender, nondistended. Bowel sounds present. No organomegaly or mass.  EXTREMITIES: bilateral arm and leg edema 2-3+, no cyanosis, or clubbing.  NEUROLOGIC: unable to exam. PSYCHIATRIC: The patient opened eyes to verbal stimuli. Noncommunicative. SKIN: Sacral DU stage 3.    LABORATORY PANEL:   CBC  Recent Labs Lab 07/19/15 0632  WBC 10.2  HGB 8.7*  HCT 26.7*  PLT 124*   ------------------------------------------------------------------------------------------------------------------  Chemistries   Recent Labs Lab 07/18/15 1822 07/19/15 0632  NA  --  153*  K  --  3.1*  CL  --  123*  CO2  --  22  GLUCOSE  --  332*  BUN  --  44*  CREATININE  --  1.74*  CALCIUM  --  9.2  MG 2.0  --     ------------------------------------------------------------------------------------------------------------------  Cardiac Enzymes No results for input(s): TROPONINI in the last 168 hours. ------------------------------------------------------------------------------------------------------------------  RADIOLOGY:  No results found.  EKG:   Orders placed or performed during the hospital encounter of 07/10/15  . EKG 12-Lead  . EKG 12-Lead    ASSESSMENT AND PLAN:   Acute diastolicCHF (congestive heart failure) (HCC)  She was on a low rate Lasix drip, with concurrent albumin infusion. Echocardiogram: EF: 45% - 50% . carefully diuresis following renal function and electrolytes per Dr. Lady Gary,  cardiology consult. Continue Lasix drip.   Anasarca. gentle diuresis with albumin infusion as above. Lasix drip was on hold due to worsening renal function and low side blood pressure. Resumed Lasix drip per nephrology consult. Discontinued lasix drip due to worsening renal function and restarted per Dr. Dr. Thedore Mins.  Sacral DU stage 3, wound care.  Acute renal failure on chronic kidney disease stage III, Worsening but stable . Continue  lasix drip per Dr. Thedore Mins.   Hypernatremia. secondary to loop diuretics. Worsening but better today.  Encourage family and the nurse to give oral water. I discussed with the patient's son about options for NGT or PEG tube placement for feeding and the treatment of hypernatremia yesterday. The patient's daughter and another son said her oral intake is better, they want to wait to decide, but the agree PEG if necessary. She is on D5 iv. F/u BMP.  Hypokalemia. give KCl supplement. Follow-up level. Magnesium is normal.  History of sacral osteomyelitis, Aspiration pneumonia and UTI.She has been treated with Zosyn, hold vancomycin due to elevated level. Negative Blood cultures so far. urine cultures: CANDIDA ALBICANS.  changed to oral cipro 500 bid and doxy 100  bid for #4/7 days, Continue Diflucan for #4/7 more days, She will no longer need her Central line at DC per Dr. Sampson GoonFitzgerald.  Acute metabolic encephalopathy and Dementia. Continue dysphagia 1 diet per speech study. Aspiration precaution.   HTN (hypertension) - blood pressure borderline low, holding antihypertensives for now.  Type 2 diabetes mellitus (HCC) - sliding scale insulin with corresponding glucose checks. Add levemir due to elevated BS.  Anemia of chronic disease. Hb decreased from 8.1 to 7.0.  Status post 1 units PRBC transfusion, Hb increased to 9.7. No active bleeding. Hemoglobin decreased to 6.7, she was given another 1 units of PRBC transfusion and Hb up to 8.7.  Hematuria. Hold Lovenox. Improved.   Poor prognosis.  Dr. Orvan Falconerampbell had a meeting with family members. Per Dr. Orvan Falconerampbell, FM (3/6 children) want further treatment without hospice care or SNF placement.  I discussed with her son about the worsening condition, high risk for cardiopulmonary arrest and very poor prognosis. He discussed with other FM and they want to continue treatment.  I discussed with Dr. Thedore MinsSingh.  Physical therapy evaluation suggested skilled nursing facility placement. All the records are reviewed and case discussed with Care Management/Social Workerr. Management plans discussed with the patient's daughter and son and they are in agreement.  CODE STATUS: DNR  TOTAL TIME TAKING CARE OF THIS PATIENT: 41 minutes.  Greater than 50% time was spent on coordination of care and face-to-face counseling.  POSSIBLE D/C IN >3 DAYS, DEPENDING ON CLINICAL CONDITION.   Shaune Pollackhen, Nadira Single M.D on 07/19/2015 at 1:09 PM  Between 7am to 6pm - Pager - [phone]  After 6pm go to [email or web address] - password [email or web address] [facility]  [facility]  Office  [phone]  CC: Primary care physician; Kurtis BushmanGLADMAN, CHRISTINE D, MD

## 2015-07-19 NOTE — Progress Notes (Signed)
Yesterday potassium was replaced and increased to 3.9 currently on a lasix drip at 4 and current potassium this morning is 3.1 Dr. Thedore MinsSingh notified and orders for a d5 with 20 K ordered for patient to maintain potassium.

## 2015-07-19 NOTE — Progress Notes (Signed)
[facility]  ROUNDING NOTE   Subjective:   Breathing is better Sodium remains high ut has improved to 150 today Potassium level is not low at 3.1 Serum creatinine about the same at 1.74  Urine output 1150 cc ast 24 hours  Continued on Lasix drip  Objective:  Vital signs in last 24 hours:  Temp:  [97.6 F (36.4 C)-98.6 F (37 C)] 97.6 F (36.4 C) (04/28 1136) Pulse Rate:  [69-71] 70 (04/28 1136) Resp:  [18] 18 (04/28 1136) BP: (92-117)/(37-69) 92/58 mmHg (04/28 1136) SpO2:  [96 %-100 %] 96 % (04/28 1136)  Weight change:  Filed Weights   07/10/15 1757 07/10/15 2305  Weight: 90.719 kg (200 lb) 84.188 kg (185 lb 9.6 oz)    Intake/Output: I/O last 3 completed shifts: In: -  Out: 1750 [Urine:1750]   Intake/Output this shift:  Total I/O In: 120 [P.O.:120] Out: 0   Physical Exam: General: NAD, laying in bed  Head: Normocephalic, atraumatic. Moist oral mucosal membranes  Eyes: Anicteric,   Neck: Supple, trachea midline  Lungs:  Diffuse mild crackles bilaterally  Heart: Irregular rhythm  Abdomen:  Soft, nontender, obese  Extremities: ++ generalized edema.  Neurologic: Not following commands,   Skin: No lesions  GU: Foley with light yellow urine    Basic Metabolic Panel:  Recent Labs Lab 07/15/15 0948 07/16/15 0626 07/17/15 0938 07/17/15 1226 07/18/15 0500 07/18/15 1819 07/18/15 1822 07/19/15 0632  NA 154* 155* 156*  --  158*  --   --  153*  K 3.0* 3.4* 3.1*  --  2.9* 3.6  --  3.1*  CL 123* 125* 125*  --  125*  --   --  123*  CO2 --  22  --   --  22  GLUCOSE 136* 194* 190*  --  184*  --   --  332*  BUN 48* 48* 47*  --  47*  --   --  44*  CREATININE 1.75* 1.72* 1.71*  --  1.76*  --   --  1.74*  CALCIUM 9.2 9.4 9.3  --  9.4  --   --  9.2  MG 2.3  --   --  2.3 2.1  --  2.0  --   PHOS  --   --   --   --  2.9  --   --   --     Liver Function Tests: No results for input(s): AST, ALT, ALKPHOS, BILITOT, PROT, ALBUMIN in the last 168  hours. No results for input(s): LIPASE, AMYLASE in the last 168 hours. No results for input(s): AMMONIA in the last 168 hours.  CBC:  Recent Labs Lab 07/15/15 0948 07/16/15 0626 07/17/15 0938 07/18/15 0500 07/19/15 0632  WBC 9.8 9.6 8.2 10.5 10.2  HGB 7.7* 7.8* 6.7* 8.7* 8.7*  HCT 23.4* 24.2* 20.8* 26.4* 26.7*  MCV 86.9 89.3 88.0 85.9 88.5  PLT 127* 145* 126* 105* 124*    Cardiac Enzymes: No results for input(s): CKTOTAL, CKMB, CKMBINDEX, TROPONINI in the last 168 hours.  BNP: Invalid input(s): POCBNP  CBG:  Recent Labs Lab 07/18/15 1822 07/19/15 0012 07/19/15 0627 07/19/15 0837 07/19/15 1138  GLUCAP 221* 238* 279* 281* 285*    Microbiology: Results for orders placed or performed during the hospital encounter of 07/10/15  Blood culture (routine x 2)     Status: None (Preliminary result)   Collection Time: 07/10/15  6:23 PM  Result Value Ref Range Status  Specimen Description BLOOD PICC LINE  Final   Special Requests BOTTLES DRAWN AEROBIC AND ANAEROBIC  8CC  Final   Culture NO GROWTH 4 DAYS  Final   Report Status PENDING  Incomplete  Blood culture (routine x 2)     Status: None (Preliminary result)   Collection Time: 07/10/15  7:00 PM  Result Value Ref Range Status   Specimen Description BLOOD PICC LINE  Final   Special Requests BOTTLES DRAWN AEROBIC AND ANAEROBIC  5CC  Final   Culture NO GROWTH 4 DAYS  Final   Report Status PENDING  Incomplete  Urine culture     Status: Abnormal   Collection Time: 07/10/15  8:04 PM  Result Value Ref Range Status   Specimen Description URINE, RANDOM  Final   Special Requests NONE  Final   Culture 50,000 COLONIES/mL CANDIDA ALBICANS (A)  Final   Report Status 07/13/2015 FINAL  Final  Urine culture     Status: Abnormal   Collection Time: 07/15/15  3:58 PM  Result Value Ref Range Status   Specimen Description URINE, CATHETERIZED  Final   Special Requests NONE  Final   Culture 20,000 COLONIES/mL CANDIDA ALBICANS (A)   Final   Report Status 07/19/2015 FINAL  Final    Coagulation Studies: No results for input(s): LABPROT, INR in the last 72 hours.  Urinalysis: No results for input(s): COLORURINE, LABSPEC, PHURINE, GLUCOSEU, HGBUR, BILIRUBINUR, KETONESUR, PROTEINUR, UROBILINOGEN, NITRITE, LEUKOCYTESUR in the last 72 hours.  Invalid input(s): APPERANCEUR    Imaging: No results found.   Medications:   . dextrose 5 % with KCl 20 mEq / L 20 mEq (07/19/15 1015)  . furosemide (LASIX) infusion Stopped (07/19/15 0418)   . sodium chloride   Intravenous Once  . ciprofloxacin  200 mg Intravenous Q12H  . doxycycline (VIBRAMYCIN) IV  100 mg Intravenous Q12H  . fluconazole (DIFLUCAN) IV  100 mg Intravenous Q24H  . insulin aspart  0-5 Units Subcutaneous QHS  . insulin aspart  0-9 Units Subcutaneous TID WC  . insulin detemir  5 Units Subcutaneous QHS  . potassium chloride  10 mEq Intravenous Q1 Hr x 4  . sodium chloride flush  10-40 mL Intracatheter Q12H  . sodium chloride flush  3 mL Intravenous Q12H   ipratropium-albuterol, ondansetron **OR** ondansetron (ZOFRAN) IV, sodium chloride flush  Assessment/ Plan:  Ms. Jeanella CaraSarah Zajkowski is a 82 y.o. black female with dementia, hypertension, diabetes mellitus type II, gout, who [facility]   1. Acute renal failure on chronic kidney disease stage III: due to acute exacerbation of CHF causing acute cardiorenal syndrome. Creatinine fluctuates with fluid status. Baseline most likely ~1.3 from 05/2015.  - Urine output is fair, serum creatinine 1.74  2. Hypernatremia, hypokalemia: . - Patient needs free water replacement  - currently taking in liquids with Thick-It because of difficulty swallowing   - continue D5 withpotassium supplementation  3. Acute exacerbation of systolic congestive heart failure: EF of 40-45%.  With anasarca with third spacing. And hypoalbuminia.  - continue Lasix drip to address pulmonary edema   4. Chronic sacral  osteomyelitis:  Antibiotics per primary team  5. Gross hematuria - Hold Lovenox - urine as cleared now      LOS: 9 Kleigh Hoelzer 4/28/20172:20 PM

## 2015-07-19 NOTE — Progress Notes (Signed)
Inpatient Diabetes Program Recommendations  AACE/ADA: New Consensus Statement on Inpatient Glycemic Control (2015)  Target Ranges:  Prepandial:   less than 140 mg/dL      Peak postprandial:   less than 180 mg/dL (1-2 hours)      Critically ill patients:  140 - 180 mg/dL   Review of Glycemic Control  Results for Jasmine Briggs, Jasmine Briggs (MRN [id number]) as of 07/19/2015 09:55  Ref. Range 07/18/2015 11:09 07/18/2015 18:22 07/19/2015 00:12 07/19/2015 06:27 07/19/2015 08:37  Glucose-Capillary Latest Ref Range: 65-99 mg/dL 629182 (H) 528221 (H) 413238 (H) 279 (H) 281 (H)   Diabetes history: DM2 Outpatient Diabetes medications: None Current orders for Inpatient glycemic control: Novolog 0-9 units tid, Novolog 0-5 units qhs  Susette RacerJulie Ronold Hardgrove, RN, [place], [place]HA, CDE Diabetes Coordinator Inpatient Diabetes Program  [phone] (Team Pager) [phone] [facility] Office) 07/22/2015 10:46 AM

## 2015-07-19 NOTE — Progress Notes (Signed)
Pharmacy Antibiotic Note  Jeanella CaraSarah Escoto is a 82 y.o. female admitted on 07/10/2015 with pneumonia.  Pharmacy has been consulted for Cipro dosing. Discontinuing Vancomcyin and Zosyn.  Patient was recently admitted and discharged on 4/5 with a PICC line on antibiotics for sacral decub and possible OM, UTI, and pneumonia. Patient was receiving vancomycin 500 mg IV daily PTA.  Unclear how long patient has been receiving treatment for OM.   Plan: Continue Cipro 200mg  IV Q12H Continue Fluconazole 100 mg IV q24h based on renal function.  Height: 5\' 7"  (170.2 cm) Weight: 185 lb 9.6 oz (84.188 kg) IBW/kg (Calculated) : 61.6  Temp (24hrs), Avg:98.1 F (36.7 C), Min:97.6 F (36.4 C), Max:98.6 F (37 C)   Recent Labs Lab 07/14/15 0735 07/15/15 0635 07/15/15 0948 07/16/15 0626 07/17/15 0938 07/18/15 0500 07/19/15 0632  WBC  --   --  9.8 9.6 8.2 10.5 10.2  CREATININE  --   --  1.75* 1.72* 1.71* 1.76* 1.74*  VANCOTROUGH 40*  --   --   --   --   --   --   VANCORANDOM  --  38  --   --   --   --   --     Estimated Creatinine Clearance: 23 mL/min (by C-G formula based on Cr of 1.74).    No Known Allergies  Antimicrobials this admission: Vancomycin 4/20 >> 4/24  Zosyn 4/20 >> 4/24 Diflucan 4/21 >>  Doxycycline 4/24 >> Cipro 4/24 >>  Dose adjustments this admission: 4/20 Vancomycin 1gm IV Q24H -> 500mg  Q24H 4/20 Zosyn 4.5gm IV Q8H -> 3.75gm IV Q8H 4/23 Vancomycin held, will redose 4/24 Vancomycin and Zosyn discontinued  Microbiology results: 4/19 BCx: pending 4/19 UCx: 50 k candida albicans 4/19 Sputum: pending   Thank you for allowing pharmacy to be a part of this patient's care.  Garlon HatchetJody Briena Swingler, PharmD Clinical Pharmacist   07/19/2015

## 2015-07-19 NOTE — Progress Notes (Signed)
Inpatient Diabetes Program Recommendations  AACE/ADA: New Consensus Statement on Inpatient Glycemic Control (2015)  Target Ranges:  Prepandial:   less than 140 mg/dL      Peak postprandial:   less than 180 mg/dL (1-2 hours)      Critically ill patients:  140 - 180 mg/dL  Results for Jasmine Briggs, Rie (MRN [id number]) as of 07/19/2015 12:14  Ref. Range 07/18/2015 07:31 07/18/2015 11:09 07/18/2015 18:22 07/19/2015 00:12 07/19/2015 06:27 07/19/2015 08:37 07/19/2015 11:38  Glucose-Capillary Latest Ref Range: 65-99 mg/dL 962158 (H) 952182 (H) 841221 (H) 238 (H) 279 (H) 281 (H) 285 (H)   Review of Glycemic Control  Diabetes history: DM2 Outpatient Diabetes medications: None Current orders for Inpatient glycemic control: Novolog 0-9 units TID with meals, Novolog 0-5 units QHS  Inpatient Diabetes Program Recommendations: Insulin - Basal: Please consider ordering low dose basal insulin. Recommend starting with Levemir 5 units QHS. Correction (SSI): If patient continues to have poor PO intake, please consider changing the frequency of CBGs and Novolog to Q4H.  Thanks, Orlando PennerMarie Asaf Elmquist, RN, MSN, CDE Diabetes Coordinator Inpatient Diabetes Program [phone] (Team Pager from 8am to 5pm) [phone] ([facility] office) [phone] [facility] office) [phone] [facility] office)

## 2015-07-19 NOTE — Progress Notes (Signed)
Patients blood sugar is 279 patient on d5 gtt at 50 mL/hr. Patient is not currently on sliding scale dr. Imogene Burnhen notified. Orders for low dose sliding scale ordered. Will continue to monitor

## 2015-07-20 LAB — GLUCOSE, CAPILLARY
GLUCOSE-CAPILLARY: 148 mg/dL — AB (ref 65–99)
GLUCOSE-CAPILLARY: 140 mg/dL — AB (ref 65–99)
Glucose-Capillary: 158 mg/dL — ABNORMAL HIGH (ref 65–99)
Glucose-Capillary: 166 mg/dL — ABNORMAL HIGH (ref 65–99)

## 2015-07-20 LAB — BASIC METABOLIC PANEL
Anion gap: 10 (ref 5–15)
Anion gap: 9 (ref 5–15)
BUN: 41 mg/dL — AB (ref 6–20)
BUN: 41 mg/dL — AB (ref 6–20)
CHLORIDE: 120 mmol/L — AB (ref 101–111)
CHLORIDE: 121 mmol/L — AB (ref 101–111)
CO2: 21 mmol/L — AB (ref 22–32)
CO2: 21 mmol/L — ABNORMAL LOW (ref 22–32)
Calcium: 9.4 mg/dL (ref 8.9–10.3)
Calcium: 9.1 mg/dL (ref 8.9–10.3)
Creatinine, Ser: 1.72 mg/dL — ABNORMAL HIGH (ref 0.44–1.00)
Creatinine, Ser: 1.68 mg/dL — ABNORMAL HIGH (ref 0.44–1.00)
GFR calc Af Amer: 29 mL/min — ABNORMAL LOW (ref >60)
GFR calc Af Amer: 29 mL/min — ABNORMAL LOW (ref >60)
GFR calc non Af Amer: 25 mL/min — ABNORMAL LOW (ref >60)
GFR calc non Af Amer: 25 mL/min — ABNORMAL LOW (ref >60)
GLUCOSE: 156 mg/dL — AB (ref 65–99)
GLUCOSE: 168 mg/dL — AB (ref 65–99)
POTASSIUM: 3.6 mmol/L (ref 3.5–5.1)
POTASSIUM: 3.6 mmol/L (ref 3.5–5.1)
Sodium: 151 mmol/L — ABNORMAL HIGH (ref 135–145)
Sodium: 151 mmol/L — ABNORMAL HIGH (ref 135–145)

## 2015-07-20 LAB — CBC
HEMATOCRIT: 25.9 % — AB (ref 35.0–47.0)
HEMOGLOBIN: 8.3 g/dL — AB (ref 12.0–16.0)
MCH: 28.1 pg (ref 26.0–34.0)
MCHC: 31.9 g/dL — ABNORMAL LOW (ref 32.0–36.0)
MCV: 88.3 fL (ref 80.0–100.0)
Platelets: 148 10*3/uL — ABNORMAL LOW (ref 150–440)
RBC: 2.94 MIL/uL — ABNORMAL LOW (ref 3.80–5.20)
RDW: 19.4 % — ABNORMAL HIGH (ref 11.5–14.5)
WBC: 11.9 10*3/uL — ABNORMAL HIGH (ref 3.6–11.0)

## 2015-07-20 LAB — CULTURE, BLOOD (ROUTINE X 2)
CULTURE: NO GROWTH
Culture: NO GROWTH

## 2015-07-20 LAB — MAGNESIUM: MAGNESIUM: 1.9 mg/dL (ref 1.7–2.4)

## 2015-07-20 LAB — PHOSPHORUS: Phosphorus: 2.9 mg/dL (ref 2.5–4.6)

## 2015-07-20 MED ORDER — INSULIN DETEMIR 100 UNIT/ML ~~LOC~~ SOLN
8.0000 [IU] | Freq: Every day | SUBCUTANEOUS | Status: DC
  Administered 2015-07-20 – 2015-07-23 (×4): 8 [IU] via SUBCUTANEOUS
  Filled 2015-07-20 (×5): qty 0.08

## 2015-07-20 NOTE — Progress Notes (Signed)
[facility]  ROUNDING NOTE   Subjective:   Breathing is better Sodium remains high but stable at 151 today Potassium level has improved to 3.6 Serum creatinine about the same at 1.68  Urine output 900 cc ast 24 hours  Continued on Lasix drip  Objective:  Vital signs in last 24 hours:  Temp:  [97.5 F (36.4 C)-97.7 F (36.5 C)] 97.7 F (36.5 C) (04/29 0800) Pulse Rate:  [69-70] 70 (04/29 1126) Resp:  [18-20] 19 (04/29 1126) BP: (100-123)/(32-83) 100/34 mmHg (04/29 1126) SpO2:  [96 %-100 %] 100 % (04/29 1126)  Weight change:  Filed Weights   07/10/15 1757 07/10/15 2305  Weight: 90.719 kg (200 lb) 84.188 kg (185 lb 9.6 oz)    Intake/Output: I/O last 3 completed shifts: In: 1094 [P.O.:120; I.V.:624; IV Piggyback:350] Out: 1600 [Urine:1600]   Intake/Output this shift:  Total I/O In: 240 [P.O.:240] Out: 0   Physical Exam: General: NAD, laying in bed  Head: Normocephalic, atraumatic. Moist oral mucosal membranes  Eyes: Anicteric,   Neck: Supple, trachea midline  Lungs:  Diffuse mild crackles bilaterally  Heart: Irregular rhythm  Abdomen:  Soft, nontender, obese  Extremities: ++ generalized edema.  Neurologic: Not following commands,   Skin: No lesions  GU: Foley with light yellow urine    Basic Metabolic Panel:  Recent Labs Lab 07/15/15 0948  07/17/15 0938 07/17/15 1226 07/18/15 0500 07/18/15 1819 07/18/15 1822 07/19/15 0632 07/19/15 2302 07/20/15 0500 07/20/15 1150  NA 154*  < > 156*  --  158*  --   --  153*  --  151* 151*  K 3.0*  < > 3.1*  --  2.9* 3.6  --  3.1* 3.6 3.6 3.6  CL 123*  < > 125*  --  125*  --   --  123*  --  120* 121*  CO2 22  < > 22  --  22  --   --  22  --  21* 21*  GLUCOSE 136*  < > 190*  --  184*  --   --  332*  --  156* 168*  BUN 48*  < > 47*  --  47*  --   --  44*  --  41* 41*  CREATININE 1.75*  < > 1.71*  --  1.76*  --   --  1.74*  --  1.72* 1.68*  CALCIUM 9.2  < > 9.3  --  9.4  --   --  9.2  --  9.4 9.1  MG  2.3  --   --  2.3 2.1  --  2.0  --   --   --  1.9  PHOS  --   --   --   --  2.9  --   --   --   --   --  2.9  < > = values in this interval not displayed.  Liver Function Tests: No results for input(s): AST, ALT, ALKPHOS, BILITOT, PROT, ALBUMIN in the last 168 hours. No results for input(s): LIPASE, AMYLASE in the last 168 hours. No results for input(s): AMMONIA in the last 168 hours.  CBC:  Recent Labs Lab 07/16/15 0626 07/17/15 0938 07/18/15 0500 07/19/15 0632 07/20/15 0500  WBC 9.6 8.2 10.5 10.2 11.9*  HGB 7.8* 6.7* 8.7* 8.7* 8.3*  HCT 24.2* 20.8* 26.4* 26.7* 25.9*  MCV 89.3 88.0 85.9 88.5 88.3  PLT 145* 126* 105* 124* 148*    Cardiac Enzymes: No results for input(s): CKTOTAL,  CKMB, CKMBINDEX, TROPONINI in the last 168 hours.  BNP: Invalid input(s): POCBNP  CBG:  Recent Labs Lab 07/19/15 1704 07/19/15 2128 07/19/15 2351 07/20/15 0534 07/20/15 1126  GLUCAP 176* 161* 188* 158* 148*    Microbiology: Results for orders placed or performed during the hospital encounter of 07/10/15  Blood culture (routine x 2)     Status: None   Collection Time: 07/10/15  6:23 PM  Result Value Ref Range Status   Specimen Description BLOOD PICC LINE  Final   Special Requests BOTTLES DRAWN AEROBIC AND ANAEROBIC  8CC  Final   Culture NO GROWTH 10 DAYS  Final   Report Status 07/20/2015 FINAL  Final  Blood culture (routine x 2)     Status: None   Collection Time: 07/10/15  7:00 PM  Result Value Ref Range Status   Specimen Description BLOOD PICC LINE  Final   Special Requests BOTTLES DRAWN AEROBIC AND ANAEROBIC  5CC  Final   Culture NO GROWTH 10 DAYS  Final   Report Status 07/20/2015 FINAL  Final  Urine culture     Status: Abnormal   Collection Time: 07/10/15  8:04 PM  Result Value Ref Range Status   Specimen Description URINE, RANDOM  Final   Special Requests NONE  Final   Culture 50,000 COLONIES/mL CANDIDA ALBICANS (A)  Final   Report Status 07/13/2015 FINAL  Final  Urine  culture     Status: Abnormal   Collection Time: 07/15/15  3:58 PM  Result Value Ref Range Status   Specimen Description URINE, CATHETERIZED  Final   Special Requests NONE  Final   Culture 20,000 COLONIES/mL CANDIDA ALBICANS (A)  Final   Report Status 07/19/2015 FINAL  Final    Coagulation Studies: No results for input(s): LABPROT, INR in the last 72 hours.  Urinalysis: No results for input(s): COLORURINE, LABSPEC, PHURINE, GLUCOSEU, HGBUR, BILIRUBINUR, KETONESUR, PROTEINUR, UROBILINOGEN, NITRITE, LEUKOCYTESUR in the last 72 hours.  Invalid input(s): APPERANCEUR    Imaging: No results found.   Medications:   . dextrose 5 % with KCl 20 mEq / L 20 mEq (07/20/15 0803)  . furosemide (LASIX) infusion Stopped (07/19/15 0418)   . sodium chloride   Intravenous Once  . ciprofloxacin  200 mg Intravenous Q12H  . doxycycline (VIBRAMYCIN) IV  100 mg Intravenous Q12H  . fluconazole (DIFLUCAN) IV  100 mg Intravenous Q24H  . insulin aspart  0-5 Units Subcutaneous QHS  . insulin aspart  0-9 Units Subcutaneous TID WC  . insulin detemir  8 Units Subcutaneous QHS  . sodium chloride flush  10-40 mL Intracatheter Q12H  . sodium chloride flush  3 mL Intravenous Q12H   ipratropium-albuterol, ondansetron **OR** ondansetron (ZOFRAN) IV, sodium chloride flush  Assessment/ Plan:  Ms. Jeanella CaraSarah Egolf is a 82 y.o. black female with dementia, hypertension, diabetes mellitus type II, gout, who [facility]   1. Acute renal failure on chronic kidney disease stage III: due to acute exacerbation of CHF causing acute cardiorenal syndrome. Creatinine fluctuates with fluid status. Baseline most likely ~1.3 from 05/2015.  - Urine output is fair, serum creatinine 1.68  2. Hypernatremia, hypokalemia: . - Patient needs free water replacement  - currently taking in liquids with Thick-It because of difficulty swallowing   - continue D5 with potassium supplementation  3. Acute exacerbation of  systolic congestive heart failure: EF of 40-45%.  With anasarca with third spacing. And hypoalbuminia.  - continue Lasix drip to address pulmonary edema  4. Chronic sacral osteomyelitis:  Antibiotics per primary team  5. Gross hematuria - Hold Lovenox - urine as cleared now      LOS: 10 Aune Adami 4/29/20173:48 PM

## 2015-07-20 NOTE — Progress Notes (Signed)
[facility] - [place] at [facility]   PATIENT NAME: Jasmine Briggs    MR#:  [id number]  DATE OF BIRTH:  09/18/1920  SUBJECTIVE:  CHIEF COMPLAINT:   Chief Complaint  Patient presents with  . Altered Mental Status   The patient is lethargic, non-communicative. Still poor oral intake per RN, but daughter said oral intake is better. fair urine output, Now on D5 water solution. Sodium level has improved today, however, patient does not communicate. REVIEW OF SYSTEMS:  Unable to get ROS.  DRUG ALLERGIES:  No Known Allergies  VITALS:  Blood pressure 100/34, pulse 70, temperature 97.7 F (36.5 C), temperature source Axillary, resp. rate 19, height  (1.702 m), weight 84.188 kg (185 lb 9.6 oz), SpO2 100 %.  PHYSICAL EXAMINATION:  GENERAL:  82 y.o.-year-old patient lying in the bed with no acute distress. Somnolent in the bed, opens her eyes, however, does not converse EYES: blindness. HEENT: Head atraumatic, normocephalic.  NECK:  Supple, no jugular venous distention. No thyroid enlargement, no tenderness.  LUNGS: Diminished breath sounds bilaterally, no wheezing, bilateral rales. No use of accessory muscles of respiration.  CARDIOVASCULAR: S1, S2 normal. No murmurs, rubs, or gallops.  ABDOMEN: Soft, nontender, nondistended. Bowel sounds present. No organomegaly or mass.  EXTREMITIES: bilateral arm and leg edema 2-3+, no cyanosis, or clubbing.  NEUROLOGIC: unable to exam. PSYCHIATRIC: The patient opened eyes to verbal stimuli. Noncommunicative. SKIN: Sacral DU stage 3. Skin is thin, prone to tears   LABORATORY PANEL:   CBC  Recent Labs Lab 07/20/15 0500  WBC 11.9*  HGB 8.3*  HCT 25.9*  PLT 148*   ------------------------------------------------------------------------------------------------------------------  Chemistries   Recent Labs Lab 07/20/15 1150  NA 151*  K 3.6  CL 121*  CO2 21*  GLUCOSE 168*  BUN 41*  CREATININE 1.68*  CALCIUM  9.1  MG 1.9   ------------------------------------------------------------------------------------------------------------------  Cardiac Enzymes No results for input(s): TROPONINI in the last 168 hours. ------------------------------------------------------------------------------------------------------------------  RADIOLOGY:  No results found.  EKG:   Orders placed or performed during the hospital encounter of 07/10/15  . EKG 12-Lead  . EKG 12-Lead    ASSESSMENT AND PLAN:   Acute diastolicCHF (congestive heart failure) (HCC)  She was on a low rate Lasix drip, with concurrent albumin infusion. Echocardiogram: EF: 45% - 50% . carefully diuresis following renal function and electrolytes per Dr. Lady Gary,  cardiology consult. Continue Lasix drip, Kidney function remains relatively stable.   Anasarca. gentle diuresis with albumin infusion as above. Lasix drip was on hold due to worsening renal function and low side blood pressure. Resumed Lasix drip per nephrology recommendations.   Sacral DU stage 3, wound care.  Acute renal failure due to ATN with a known history of chronic kidney disease stage III, stable at present . Continue  lasix drip per Dr. Thedore Mins.   Hypernatremia. secondary to loop diuretics. Better today. Discussed this patient's daughter today who agrees that patient should have supportive care, so she is comfortable and not suffering, palliative care is involved for further recommendations and discussions with family, likely to resume discussions next Monday   Hypokalemia. Resolved with KCl supplementation.  Magnesium is normal.  History of sacral osteomyelitis, Aspiration pneumonia and UTI.She has been treated with Zosyn, hold vancomycin due to elevated level. Negative Blood cultures so far. urine cultures: CANDIDA ALBICANS. changed to oral cipro 500 bid and doxy 100 bid for #5/7 days, Continue Diflucan for #5/7 days, She will no longer need her Central line at DC  per Dr. Sampson GoonFitzgerald.  Acute metabolic encephalopathy and Dementia. Continue dysphagia 1 diet per speech study. Aspiration precaution.   HTN (hypertension) - blood pressure borderline low, holding antihypertensives for now.  Type 2 diabetes mellitus (HCC) - sliding scale insulin with corresponding glucose checks. Blood glucose levels are at 150 to 200s Advance Levemir  due to elevated BS.  Anemia of chronic disease. Hb decreased from 8.1 to 7.0.  Status post 1 units PRBC transfusion, Hb increased to 9.7. No active bleeding. Hemoglobin decreased to 6.7, she was given another 1 units of PRBC transfusion and Hb up to 8.3  today. .  Hematuria. Holding Lovenox. Improved.   Poor prognosis.  Dr. Orvan Falconerampbell had a meeting with family members. Per Dr. Orvan Falconerampbell, FM (3/6 children) want further treatment without hospice care or SNF placement.  I discussed with her son about the worsening condition, high risk for cardiopulmonary arrest and very poor prognosis. He discussed with other FM and they want to continue treatment.  I discussed with Dr. Thedore MinsSingh.  Physical therapy evaluation suggested skilled nursing facility placement. All the records are reviewed and case discussed with Care Management/Social Workerr. Management plans discussed with the patient's daughter and son and they are in agreement.  CODE STATUS: DNR  TOTAL TIME TAKING CARE OF THIS PATIENT: 41 minutes.  Greater than 50% time was spent on coordination of care and face-to-face counseling.  POSSIBLE D/C IN >3 DAYS, DEPENDING ON CLINICAL CONDITION.   Katharina CaperVAICKUTE,Aseel Uhde M.D on 07/20/2015 at 1:11 PM  Between 7am to 6pm - Pager - [phone]  After 6pm go to [email or web address] - password [email or web address]  [facility]  Office  [phone]  CC: Primary care physician; Kurtis BushmanGLADMAN, CHRISTINE D, MD

## 2015-07-20 NOTE — Progress Notes (Signed)
Pharmacy Consult for Electrolyte supplementation  Labs:  Recent Labs  07/18/15 0500 07/19/15 0632 07/20/15 0500  WBC 10.5 10.2 11.9*  HGB 8.7* 8.7* 8.3*  HCT 26.4* 26.7* 25.9*  PLT 105* 124* 148*     Recent Labs  07/17/15 1226 07/18/15 0500  07/18/15 1822 07/19/15 0632 07/19/15 2302 07/20/15 0500  NA  --  158*  --   --  153*  --  151*  K  --  2.9*  < >  --  3.1* 3.6 3.6  CL  --  125*  --   --  123*  --  120*  CO2  --  22  --   --  22  --  21*  GLUCOSE  --  184*  --   --  332*  --  156*  BUN  --  47*  --   --  44*  --  41*  CREATININE  --  1.76*  --   --  1.74*  --  1.72*  CALCIUM  --  9.4  --   --  9.2  --  9.4  MG 2.3 2.1  --  2.0  --   --   --   PHOS  --  2.9  --   --   --   --   --   < > = values in this interval not displayed. Estimated Creatinine Clearance: 23.3 mL/min (by C-G formula based on Cr of 1.72).  Assessment: Pharmacy consulted to monitor and supplement electrolytes in a 82 yo female admitted with ulcers.  Patient with CHF and on furosemide infusion. Scr worse today at 1.76.  Patient too lethargic per notes and discussion with MD to take PO meds.  Of note, per MD, patient is to be evaluated for possible PEG tube for nutritional purposes.   Plan:  K 3.6 this morning.  No further supplement warranted at this time. Will recheck tomorrow morning.   Annaleigh Steinmeyer D, Pharm.D., BCPS Clinical Pharmacist 07/20/2015,10:34 AM

## 2015-07-21 LAB — BASIC METABOLIC PANEL
ANION GAP: 11 (ref 5–15)
BUN: 37 mg/dL — ABNORMAL HIGH (ref 6–20)
CALCIUM: 9.5 mg/dL (ref 8.9–10.3)
CO2: 21 mmol/L — AB (ref 22–32)
Chloride: 117 mmol/L — ABNORMAL HIGH (ref 101–111)
Creatinine, Ser: 1.56 mg/dL — ABNORMAL HIGH (ref 0.44–1.00)
GFR calc non Af Amer: 28 mL/min — ABNORMAL LOW (ref >60)
GFR, EST AFRICAN AMERICAN: 32 mL/min — AB (ref >60)
Glucose, Bld: 148 mg/dL — ABNORMAL HIGH (ref 65–99)
POTASSIUM: 3.5 mmol/L (ref 3.5–5.1)
Sodium: 149 mmol/L — ABNORMAL HIGH (ref 135–145)

## 2015-07-21 LAB — GLUCOSE, CAPILLARY
GLUCOSE-CAPILLARY: 134 mg/dL — AB (ref 65–99)
GLUCOSE-CAPILLARY: 105 mg/dL — AB (ref 65–99)
Glucose-Capillary: 89 mg/dL (ref 65–99)
Glucose-Capillary: 132 mg/dL — ABNORMAL HIGH (ref 65–99)

## 2015-07-21 LAB — CBC
HEMATOCRIT: 25.6 % — AB (ref 35.0–47.0)
HEMOGLOBIN: 8.4 g/dL — AB (ref 12.0–16.0)
MCH: 29.1 pg (ref 26.0–34.0)
MCHC: 32.7 g/dL (ref 32.0–36.0)
MCV: 89 fL (ref 80.0–100.0)
Platelets: 113 10*3/uL — ABNORMAL LOW (ref 150–440)
RBC: 2.87 MIL/uL — ABNORMAL LOW (ref 3.80–5.20)
RDW: 19.6 % — ABNORMAL HIGH (ref 11.5–14.5)
WBC: 10.3 10*3/uL (ref 3.6–11.0)

## 2015-07-21 NOTE — Progress Notes (Signed)
Pharmacy Consult for Electrolyte supplementation  Labs:  Recent Labs  07/19/15 0632 07/20/15 0500 07/21/15 0635  WBC 10.2 11.9* 10.3  HGB 8.7* 8.3* 8.4*  HCT 26.7* 25.9* 25.6*  PLT 124* 148* 113*     Recent Labs  07/18/15 1822  07/20/15 0500 07/20/15 1150 07/21/15 0635  NA  --   < > 151* 151* 149*  K  --   < > 3.6 3.6 3.5  CL  --   < > 120* 121* 117*  CO2  --   < > 21* 21* 21*  GLUCOSE  --   < > 156* 168* 148*  BUN  --   < > 41* 41* 37*  CREATININE  --   < > 1.72* 1.68* 1.56*  CALCIUM  --   < > 9.4 9.1 9.5  MG 2.0  --   --  1.9  --   PHOS  --   --   --  2.9  --   < > = values in this interval not displayed. Estimated Creatinine Clearance: 25.6 mL/min (by C-G formula based on Cr of 1.56).  Assessment: Pharmacy consulted to monitor and supplement electrolytes in a 82 yo female admitted with ulcers.  Patient with CHF and on furosemide infusion.   Plan:  Electrolytes within goal range. Will recheck electrolytes in 2 days.   Terriona Horlacher D, Pharm.D., BCPS Clinical Pharmacist 07/21/2015,8:42 AM

## 2015-07-21 NOTE — Progress Notes (Signed)
[facility] - [place] at [facility]   PATIENT NAME: Jasmine CaraSarah Briggs    MR#:  [id number]  DATE OF BIRTH:  1920-10-10  SUBJECTIVE:  CHIEF COMPLAINT:   Chief Complaint  Patient presents with  . Altered Mental Status   The patient is lethargic, non-communicative. Better oral intake, per family members. Sodium level has improved , however, patient does not communicate. Blood pressure is low but stable. Urinary output on Lasix drip is about 1600 cc over the past 24 hours.  REVIEW OF SYSTEMS:  Unable to get ROS.  DRUG ALLERGIES:  No Known Allergies  VITALS:  Blood pressure 101/60, pulse 71, temperature 97.2 F (36.2 C), temperature source Axillary, resp. rate 14, height 5\' 7"  (1.702 m), weight 84.188 kg (185 lb 9.6 oz), SpO2 96 %.  PHYSICAL EXAMINATION:  GENERAL:  82 y.o.-year-old patient lying in the bed with no acute distress. Somnolent in the bed, opens her eyes, however, does not converse EYES: blindness. HEENT: Head atraumatic, normocephalic.  NECK:  Supple, no jugular venous distention. No thyroid enlargement, no tenderness.  LUNGS: Diminished breath sounds bilaterally, no wheezing, bilateral rales. No use of accessory muscles of respiration.  CARDIOVASCULAR: S1, S2 normal. No murmurs, rubs, or gallops.  ABDOMEN: Soft, nontender, nondistended. Bowel sounds present. No organomegaly or mass.  EXTREMITIES: bilateral arm and leg edema 2-3+, no cyanosis, or clubbing.  NEUROLOGIC: unable to exam. PSYCHIATRIC: The patient opened eyes to verbal stimuli. Noncommunicative. SKIN: Sacral DU stage 3. Skin is thin, prone to tears   LABORATORY PANEL:   CBC  Recent Labs Lab 07/21/15 0635  WBC 10.3  HGB 8.4*  HCT 25.6*  PLT 113*   ------------------------------------------------------------------------------------------------------------------  Chemistries   Recent Labs Lab 07/20/15 1150 07/21/15 0635  NA 151* 149*  K 3.6 3.5  CL 121* 117*  CO2 21* 21*   GLUCOSE 168* 148*  BUN 41* 37*  CREATININE 1.68* 1.56*  CALCIUM 9.1 9.5  MG 1.9  --    ------------------------------------------------------------------------------------------------------------------  Cardiac Enzymes No results for input(s): TROPONINI in the last 168 hours. ------------------------------------------------------------------------------------------------------------------  RADIOLOGY:  No results found.  EKG:   Orders placed or performed during the hospital encounter of 07/10/15  . EKG 12-Lead  . EKG 12-Lead    ASSESSMENT AND PLAN:   Acute Combined systolic/diastolic CHF (congestive heart failure) (HCC) . Patient remained stable on Lasix drip, kidney function also remained stable. Urinary output is good at 1600 cc over the past 24 hours. Appreciate cardiology's consultation.  Patient's case was discussed with patient's son, all questions were answered. Patient's son voiced understanding of patient's likely poor outcome.    Anasarca. gentle diuresis with  Lasix drip  per nephrology recommendations.Urinary output is satisfactory   Sacral DU stage 3, wound care to be continued.  Acute  on chronic renal failure due to ATN with a known history of chronic kidney disease stage III, stable/better today  . Continue  lasix drip per Dr. Thedore MinsSingh, Discussed this patient's nephrologist today.   Hypernatremia. secondary  free water deficit, loop diuretics. Better . Discussed with patient's son today, patient's family is not very much interested to discuss further care with Dr. Orvan Falconerampbell for my discussion with  one of patient's sons.   Hypokalemia. Resolved with KCl supplementation.  Magnesium is normal.  Sepsis, due to sacral osteomyelitis, Aspiration pneumonia and UTI.She has been treated with Zosyn, vancomycin , initially.  Negative Blood cultures so far. urine cultures: CANDIDA ALBICANS.Marland Kitchen. Patient was seen by Dr. Sampson GoonFitzgerald and recommended oral/intravenous  Cipro and  doxycycline, also Diflucan. Day is day 6 out of 7, discontinue all antibiotics/antifungal fungals in the next 48 hours ,  per Dr. Sampson Goon.  Acute metabolic encephalopathy and Dementia. Continue dysphagia 1 diet per speech study. Aspiration precautions.  Hypotension with history of hypertension , holding antihypertensives for now. No IV fluids due to anasarca/CHF   Type 2 diabetes mellitus (HCC) - sliding scale insulin with corresponding glucose checks. Blood glucose levels are a110-160, continue current dose of  Levemir .  Anemia of chronic disease and of acute blood loss due to hematuria.  Status post 2 units PRBC transfusion during this admission . No active bleeding was noted . Follow hemoglobin level tomorrow morning. .  Hematuria. Holding Lovenox. Improved.  , failure to thrive, adult due to underlying dementia, metabolic encephalopathy, supportive therapy is given, I discussed the poor prognosis with patient's family who is reluctant to make decisions at this time. In regards to comfort care measures only, which patient would benefit from . Unfortunately patient's family is not willing to discuss palliation/hospice care with Dr. Orvan Falconer    I discussed medical issues with patient's son, who was presented during my evaluation.All his questions were answered, he voiced understanding.   I discussed with Dr. Thedore Mins.  Physical therapy evaluation suggested skilled nursing facility placement. All the records are reviewed and case discussed with Care Management/Social Workerr. Management plans discussed with the patient's daughter and son and they are in agreement.  CODE STATUS: DNR  TOTAL TIME TAKING CARE OF THIS PATIENT:  45 minutes.  Greater than 50% time was spent on coordination of care and face-to-face counseling.  POSSIBLE D/C IN >3 DAYS, DEPENDING ON CLINICAL CONDITION.   Katharina Caper M.D on 07/21/2015 at 1:18 PM  Between 7am to 6pm - Pager - [phone]  After 6pm go  to [email or web address] - password [email or web address]  [place] [facility]  Office  [phone]  CC: Primary care physician; Kurtis Bushman, MD

## 2015-07-21 NOTE — Progress Notes (Signed)
Patient has rested comfortably today. Has been asleep most of the day, but has opened her eyes a few times. Family fed the patient a late breakfast and she had a few bites of oatmeal and applesauce. For lunch patient had a few bites of mashed potatoes, but has not been awake for dinner. Upon arrival this AM patient was back on the bear hugger for temperature. Temp is maintaining around 97 axillary from around lunch time today, so bear hugger is now off. Q2 turns performed today as well as dressing change to sacrum. Will continue to monitor.

## 2015-07-21 NOTE — Progress Notes (Signed)
[facility]  ROUNDING NOTE   Subjective:   Resting quietly. Her son reports that she did not eat today as well as she did yesterday Sodium remains high but improved to 149 today Potassium level has improved to 3.5 Serum creatinine slightly improved at 1.56  Urine output 1650 cc last 24 hours  Continued on Lasix drip and D5  Objective:  Vital signs in last 24 hours:  Temp:  [94.5 F (34.7 C)-97.5 F (36.4 C)] 97.2 F (36.2 C) (04/30 1120) Pulse Rate:  [68-71] 71 (04/30 1109) Resp:  [14-20] 14 (04/30 1109) BP: (101-114)/(60-88) 101/60 mmHg (04/30 1109) SpO2:  [96 %-100 %] 96 % (04/30 1109)  Weight change:  Filed Weights   07/10/15 1757 07/10/15 2305  Weight: 90.719 kg (200 lb) 84.188 kg (185 lb 9.6 oz)    Intake/Output: I/O last 3 completed shifts: In: 1214 [P.O.:240; I.V.:624; IV Piggyback:350] Out: 1950 [Urine:1950]   Intake/Output this shift:     Physical Exam: General: NAD, laying in bed  Head: Normocephalic, atraumatic. Moist oral mucosal membranes  Eyes: Anicteric,   Neck: Supple, trachea midline  Lungs:  Diffuse mild crackles bilaterally  Heart: Irregular rhythm  Abdomen:  Soft, nontender, obese  Extremities: ++ generalized edema.  Neurologic: Resting quietly  Skin: No lesions  GU: Foley with light yellow urine    Basic Metabolic Panel:  Recent Labs Lab 07/15/15 0948  07/17/15 1226 07/18/15 0500  07/18/15 1822 07/19/15 0632 07/19/15 2302 07/20/15 0500 07/20/15 1150 07/21/15 0635  NA 154*  < >  --  158*  --   --  153*  --  151* 151* 149*  K 3.0*  < >  --  2.9*  < >  --  3.1* 3.6 3.6 3.6 3.5  CL 123*  < >  --  125*  --   --  123*  --  120* 121* 117*  CO2 22  < >  --  22  --   --  22  --  21* 21* 21*  GLUCOSE 136*  < >  --  184*  --   --  332*  --  156* 168* 148*  BUN 48*  < >  --  47*  --   --  44*  --  41* 41* 37*  CREATININE 1.75*  < >  --  1.76*  --   --  1.74*  --  1.72* 1.68* 1.56*  CALCIUM 9.2  < >  --  9.4  --   --  9.2   --  9.4 9.1 9.5  MG 2.3  --  2.3 2.1  --  2.0  --   --   --  1.9  --   PHOS  --   --   --  2.9  --   --   --   --   --  2.9  --   < > = values in this interval not displayed.  Liver Function Tests: No results for input(s): AST, ALT, ALKPHOS, BILITOT, PROT, ALBUMIN in the last 168 hours. No results for input(s): LIPASE, AMYLASE in the last 168 hours. No results for input(s): AMMONIA in the last 168 hours.  CBC:  Recent Labs Lab 07/17/15 0938 07/18/15 0500 07/19/15 0632 07/20/15 0500 07/21/15 0635  WBC 8.2 10.5 10.2 11.9* 10.3  HGB 6.7* 8.7* 8.7* 8.3* 8.4*  HCT 20.8* 26.4* 26.7* 25.9* 25.6*  MCV 88.0 85.9 88.5 88.3 89.0  PLT 126* 105* 124* 148* 113*  Cardiac Enzymes: No results for input(s): CKTOTAL, CKMB, CKMBINDEX, TROPONINI in the last 168 hours.  BNP: Invalid input(s): POCBNP  CBG:  Recent Labs Lab 07/20/15 1126 07/20/15 1652 07/20/15 2143 07/21/15 0733 07/21/15 1111  GLUCAP 148* 140* 166* 134* 105*    Microbiology: Results for orders placed or performed during the hospital encounter of 07/10/15  Blood culture (routine x 2)     Status: None   Collection Time: 07/10/15  6:23 PM  Result Value Ref Range Status   Specimen Description BLOOD PICC LINE  Final   Special Requests BOTTLES DRAWN AEROBIC AND ANAEROBIC  8CC  Final   Culture NO GROWTH 10 DAYS  Final   Report Status 07/20/2015 FINAL  Final  Blood culture (routine x 2)     Status: None   Collection Time: 07/10/15  7:00 PM  Result Value Ref Range Status   Specimen Description BLOOD PICC LINE  Final   Special Requests BOTTLES DRAWN AEROBIC AND ANAEROBIC  5CC  Final   Culture NO GROWTH 10 DAYS  Final   Report Status 07/20/2015 FINAL  Final  Urine culture     Status: Abnormal   Collection Time: 07/10/15  8:04 PM  Result Value Ref Range Status   Specimen Description URINE, RANDOM  Final   Special Requests NONE  Final   Culture 50,000 COLONIES/mL CANDIDA ALBICANS (A)  Final   Report Status 07/13/2015  FINAL  Final  Urine culture     Status: Abnormal   Collection Time: 07/15/15  3:58 PM  Result Value Ref Range Status   Specimen Description URINE, CATHETERIZED  Final   Special Requests NONE  Final   Culture 20,000 COLONIES/mL CANDIDA ALBICANS (A)  Final   Report Status 07/19/2015 FINAL  Final    Coagulation Studies: No results for input(s): LABPROT, INR in the last 72 hours.  Urinalysis: No results for input(s): COLORURINE, LABSPEC, PHURINE, GLUCOSEU, HGBUR, BILIRUBINUR, KETONESUR, PROTEINUR, UROBILINOGEN, NITRITE, LEUKOCYTESUR in the last 72 hours.  Invalid input(s): APPERANCEUR    Imaging: No results found.   Medications:   . dextrose 5 % with KCl 20 mEq / L 20 mEq (07/21/15 0500)  . furosemide (LASIX) infusion Stopped (07/19/15 0418)   . sodium chloride   Intravenous Once  . ciprofloxacin  200 mg Intravenous Q12H  . doxycycline (VIBRAMYCIN) IV  100 mg Intravenous Q12H  . fluconazole (DIFLUCAN) IV  100 mg Intravenous Q24H  . insulin aspart  0-5 Units Subcutaneous QHS  . insulin aspart  0-9 Units Subcutaneous TID WC  . insulin detemir  8 Units Subcutaneous QHS  . sodium chloride flush  10-40 mL Intracatheter Q12H  . sodium chloride flush  3 mL Intravenous Q12H   ipratropium-albuterol, ondansetron **OR** ondansetron (ZOFRAN) IV, sodium chloride flush  Assessment/ Plan:  [name] is a 82 y.o. black female with dementia, hypertension, diabetes mellitus type II, gout, who was admitted to [facility] on 07/10/2015   1. Acute renal failure on chronic kidney disease stage III: due to acute exacerbation of CHF causing acute cardiorenal syndrome. Creatinine fluctuates with fluid status. Baseline most likely ~1.3 from 05/2015.  - Urine output is fair, serum creatinine 1.56  2. Hypernatremia, hypokalemia: . - Patient needs free water replacement  - currently taking in liquids with Thick-It because of difficulty swallowing   - continue D5 with potassium supplementation at low dose  of 50cc/hr  3. Acute exacerbation of systolic congestive heart failure: EF of 40-45%.  With anasarca with third spacing. And  hypoalbuminia.  - continue Lasix drip to address pulmonary edema   4. Chronic sacral osteomyelitis:  Antibiotics per primary team  5. Gross hematuria - Hold Lovenox - urine has cleared now       LOS: 11 Jasmine Briggs 4/30/20171:44 PM

## 2015-07-22 LAB — BASIC METABOLIC PANEL
Anion gap: 9 (ref 5–15)
BUN: 35 mg/dL — AB (ref 6–20)
CALCIUM: 8.9 mg/dL (ref 8.9–10.3)
CO2: 21 mmol/L — ABNORMAL LOW (ref 22–32)
CREATININE: 1.52 mg/dL — AB (ref 0.44–1.00)
Chloride: 115 mmol/L — ABNORMAL HIGH (ref 101–111)
GFR calc Af Amer: 33 mL/min — ABNORMAL LOW (ref >60)
GFR, EST NON AFRICAN AMERICAN: 29 mL/min — AB (ref >60)
Glucose, Bld: 112 mg/dL — ABNORMAL HIGH (ref 65–99)
POTASSIUM: 3.5 mmol/L (ref 3.5–5.1)
SODIUM: 145 mmol/L (ref 135–145)

## 2015-07-22 LAB — GLUCOSE, CAPILLARY
GLUCOSE-CAPILLARY: 113 mg/dL — AB (ref 65–99)
Glucose-Capillary: 101 mg/dL — ABNORMAL HIGH (ref 65–99)
Glucose-Capillary: 144 mg/dL — ABNORMAL HIGH (ref 65–99)
Glucose-Capillary: 155 mg/dL — ABNORMAL HIGH (ref 65–99)

## 2015-07-22 LAB — PLATELET COUNT: PLATELETS: 119 10*3/uL — AB (ref 150–440)

## 2015-07-22 NOTE — Progress Notes (Addendum)
[facility] - [place] at [facility]   PATIENT NAME: Jasmine Briggs    MR#:  [id number]  DATE OF BIRTH:  11/22/1920  SUBJECTIVE:  CHIEF COMPLAINT:   Chief Complaint  Patient presents with  . Altered Mental Status   The patient is lethargic, non-communicative. Better oral intake, per family members. Sodium level has Normalized, patient is able to answer yes and no questions. Blood pressure is low but stable. Urinary output on Lasix drip is about 950 cc over the past 24 hours, lower than yesterday.  REVIEW OF SYSTEMS:  Unable to get ROS.  DRUG ALLERGIES:  No Known Allergies  VITALS:  Blood pressure 105/90, pulse 73, temperature 97.6 F (36.4 C), temperature source Oral, resp. rate 17, height  (1.702 m), weight 84.188 kg (185 lb 9.6 oz), SpO2 100 %.  PHYSICAL EXAMINATION:  GENERAL:  82 y.o.-year-old patient lying in the bed with no acute distress. Somnolent in the bed, opens her eyes, And admittedly able to nod head or shake, minimal verbalization EYES: blindness. HEENT: Head atraumatic, normocephalic.  NECK:  Supple, no jugular venous distention. No thyroid enlargement, no tenderness.  LUNGS: Relatively good air entrance and breath sounds bilaterally, no wheezing, few bilateral rales. No use of accessory muscles of respiration. Diminished at bases, also more diminished on the right side CARDIOVASCULAR: S1, S2 normal. No murmurs, rubs, or gallops.  ABDOMEN: Soft, nontender, nondistended. Bowel sounds present. No organomegaly or mass.  EXTREMITIES: bilateral arm and leg edema 2-3+, no cyanosis, or clubbing.  NEUROLOGIC: unable to exam. PSYCHIATRIC: The patient opened eyes to verbal stimuli. Noncommunicative. SKIN: Sacral DU stage 3. Skin is thin, prone to tears   LABORATORY PANEL:   CBC  Recent Labs Lab 07/21/15 0635 07/22/15 0529  WBC 10.3  --   HGB 8.4*  --   HCT 25.6*  --   PLT 113* 119*    ------------------------------------------------------------------------------------------------------------------  Chemistries   Recent Labs Lab 07/20/15 1150  07/22/15 0529  NA 151*  < > 145  K 3.6  < > 3.5  CL 121*  < > 115*  CO2 21*  < > 21*  GLUCOSE 168*  < > 112*  BUN 41*  < > 35*  CREATININE 1.68*  < > 1.52*  CALCIUM 9.1  < > 8.9  MG 1.9  --   --   < > = values in this interval not displayed. ------------------------------------------------------------------------------------------------------------------  Cardiac Enzymes No results for input(s): TROPONINI in the last 168 hours. ------------------------------------------------------------------------------------------------------------------  RADIOLOGY:  No results found.  EKG:   Orders placed or performed during the hospital encounter of 07/10/15  . EKG 12-Lead  . EKG 12-Lead    ASSESSMENT AND PLAN:   Acute Combined systolic/diastolic CHF (congestive heart failure) (HCC) . Patient remains stable on Lasix drip, kidney function also remained stable. Urinary output is 950 cc over the past 24 hours. Appreciate cardiology's input.  Medical condition was discussed with the grandson, all questions were answered  Anasarca. gentle diuresis with  Lasix drip  per nephrology recommendations.Urinary output is satisfactory   Sacral DU stage 3, wound care to be continued.  Acute  on chronic renal failure due to ATN with a known history of chronic kidney disease stage III, stable/better today  . Continue  lasix drip per nephrologist .   Hypernatremia. secondary  free water deficit, loop diuretics. Resolved. Patient's family is not very much interested to discuss further care with Jasmine Briggs , unfortunately, more over they are not  able to make decisions with regards to her care, and I'm afraid that patient will continue in and out hospitalization over the next few months.   Hypokalemia. Resolved with KCl  supplementation.  Magnesium is normal.  Sepsis, due to sacral osteomyelitis, Aspiration pneumonia and UTI.She has been treated with Zosyn, vancomycin , initially.  Negative Blood cultures so far. urine cultures: CANDIDA ALBICANS.Marland Kitchen. Patient was seen by Jasmine Briggs and recommended oral/intravenous Cipro and doxycycline, also Diflucan. Day is day 7 out of 7, discontinue all antibiotics/antifungal fungals tomorrow morning ,  per Jasmine Briggs.  metabolic encephalopathy and Dementia. Continue dysphagia 1 diet per speech study. Aspiration precautions. Calorie count will be initiated again today to help to discuss palliative/comfort care.   Hypotension with history of hypertension , holding antihypertensives for now. No IV fluids due to anasarca/CHF   Type 2 diabetes mellitus (HCC) - sliding scale insulin with corresponding glucose checks. Blood glucose levels are 89-160, continue current dose of  Levemir 8 units daily. Watching for hypoglycemia closely  Anemia of chronic disease and of acute blood loss due to hematuria.  Status post 2 units PRBC transfusion during this admission . No active bleeding was noted . Follow hemoglobin level periodically .  Hematuria. Holding Lovenox. Improved.  Failure to thrive, adult due to underlying dementia, metabolic encephalopathy, calorie count will be initiated again today, discussed this patient's grandson again, which may help to guide with further decision making    I discussed with Jasmine Briggs  Physical therapy evaluation suggested skilled nursing facility placement. All the records are reviewed and case discussed with Care Management/Social Workerr. Management plans discussed with the patient's daughter and son and they are in agreement.  CODE STATUS: DNR  TOTAL TIME TAKING CARE OF THIS PATIENT:  35 minutes.  Greater than 50% time was spent on coordination of care and face-to-face counseling, discussed with patient's grandson today.  POSSIBLE D/C  IN >3 DAYS, DEPENDING ON CLINICAL CONDITION.   Jasmine CaperVAICKUTE,Jasmine Briggs M.D on 07/22/2015 at 1:39 PM  Between 7am to 6pm - Pager - [phone]  After 6pm go to [email or web address] - password [email or web address] [email or web address]  [facility]  Office  [phone]  CC: Primary care physician; Kurtis BushmanGLADMAN, CHRISTINE D, MD

## 2015-07-22 NOTE — Progress Notes (Signed)
Speech Therapy Note: reviewed chart notes. Pt continues w/ multiple medical issues including stage 3 pressure ulcer, failure to thrive(adult) due to underlying Dementia, metabolic encephalopathy. MD has discussed pt's poor prognosis with pt's family who are reluctant to make decisions at this time. Pt has been tolerating a conservative diet consistency of Dys. 1 w/ Nectar liquids - a thickened liquid(Nectar) is strongly recommended at this time sec. to pt's baseline Dementia and prolonged illness/weakness at this point. Pt is at increased risk for aspiration sec. to these and other issues. Per chart notes, pt is taking only bites and sips at meals; she is often somnolent, lethargic. She requires feeding and max cues for support during feeding to take po's. Family reported they had been "mashing" her foods at home prior to this hospitalization(recent hospitalizations). MD has presented the option of alternative means of feeding as well as palliative/hospice care. As pt appears at an adequate(new) baseline for her oral intake w/ no overt s/s of aspiration being reported by staff or family, SLP recommends continuing w/ the diet of Dysphagia 1(puree) w/ Nectar consistency liquids w/ strict aspiration precautions; meds crushed in Puree; feeding assistance.  ST services will be available for any further education or assessment if pt has a change in medical status. Dietician following. NSG/CM updated.

## 2015-07-22 NOTE — Care Management (Signed)
Barrier to discharge: NA improved at 145. D5 gtt continues. Lasix gtt continues for ARF. Calorie Count for poor intake. Family wants aggressive care and home when medically stable.

## 2015-07-22 NOTE — Care Management Important Message (Signed)
Important Message  Patient Details  Name: Jasmine Briggs MRN: [id number] Date of Birth: 11-16-1920   Medicare Important Message Given:  Yes    [name], RN 07/22/2015, 12:44 PM

## 2015-07-22 NOTE — Progress Notes (Signed)
Calorie Count Note  Calorie Count Continues since 07/17/15  Diet: Dysphagia I, Nectar Thick Supplements: BankerMighty Shakes, Magic Cup  07/20/15 Breakfast: 1/2 Oatmeal, bites of applesauce, sips of water (45 kcals, 1.5 grams of protein) Lunch: Bites of mashed potatoes (intake negligeble) Dinner: 75% of puree chicken and potatoes, some other items circled on tray ticket but no amount listed (135 kcals, 12 g of protein) Supplements: Nothing documented Snacks: 4 Juices 280 kcals, 0 g of protein; 2 waters (10 calories, 0 g of protein)  Total intake: 470 kcal (22% of minimum estimated needs)  13.5 protein (13% of minimum estimated needs)  07/21/15 Breakfast: 1/2 scrambled egg white, 1/3 grits, 100% OJ (115, 3 g of protein) Lunch: ate some of [place], puree broccoli, potatoes, pudding and buttermilk according to meal tray ticket but no specific amounts available to calculate calories and protein Dinner: 1/2 Puree [place]urkey, 2 T mashed potaotes, 2 T puree green beans (75 calories, 8 g protein) Supplements: Nothing documented Snacks: 4 Nectar Juices (280 kcals, 3 Water (15 calories)  Total intake: Unable to accurately assess calories and protein as no specific amounts at lunch time  Of note, family reporting that pt does take some of the Magic Cup at times (receiving between meals and sometimes on meal trays) but this is not documented during the calorie count. Family reports she takes sips of Mighty Shake but not much more than this.   Nutrition Dx: Increased nutrient needs related to wound healing as evidenced by estimated needs  Goal: Patient will meet greater than or equal to 90% of needs  Intervention:  -Family mostly assisting pt a meal times; family reporting that they are documenting amounts eaten on meal tray ticket but they report that there are many family members visiting and assisting at meal times and some may document better than others.  Discussed with Paulino RilyAshley RN, encourage nursing staff  to complete calorie count or follow-up with family documentation to ensure accuracy -Continue Magic Cup, [employer]ighty Shakes -Based on current information; pt is not meeting nutritional needs; pt is taking in some po but not enough to meet nutritional needs and promote wound healing at this time; pt has stage III pressure ulcer.  PEG tube has been discussed with MD previously -D5 continues for hypernatremia; sodium improved (145 this AM). Noted pt receiving Buttermilk (220 mg of sodium per serving) and V8 juice (610 mg of sodium per serving) on all Lunch and [employer] trays; items removed at present time due to sodium content. Encourage thickened water, juices and milk.   Jasmine Starcherate Nikie Cid Jasmine Briggs, Jasmine Briggs, Jasmine Briggs [phone] Pager  [phone] Weekend/On-Call Pager

## 2015-07-22 NOTE — Progress Notes (Signed)
[facility]  ROUNDING NOTE   Subjective:  Pt resting comfortably in bed.  Daugther also at bedside.  Cr 1.52 at the moment.   Objective:  Vital signs in last 24 hours:  Temp:  [97.4 F (36.3 C)-97.6 F (36.4 C)] 97.6 F (36.4 C) (05/01 0349) Pulse Rate:  [72-78] 73 (05/01 1118) Resp:  [17-20] 17 (05/01 1118) BP: (105-116)/(56-90) 105/90 mmHg (05/01 1118) SpO2:  [94 %-100 %] 100 % (05/01 1118)  Weight change:  Filed Weights   07/10/15 1757 07/10/15 2305  Weight: 90.719 kg (200 lb) 84.188 kg (185 lb 9.6 oz)    Intake/Output: I/O last 3 completed shifts: In: 286 [I.V.:36; IV Piggyback:250] Out: 2600 [Urine:2600]   Intake/Output this shift:     Physical Exam: General: NAD, laying in bed  Head: Normocephalic, atraumatic. Moist oral mucosal membranes  Eyes: Anicteric,  Neck: Supple, trachea midline  Lungs:  Basilar rales  Heart: Irregular rhythm  Abdomen:  Soft, nontender, obese  Extremities: ++ generalized edema.  Neurologic: Resting quietly  Skin: No lesions  GU: Foley with light yellow urine    Basic Metabolic Panel:  Recent Labs Lab 07/17/15 1226 07/18/15 0500  07/18/15 1822 07/19/15 0632 07/19/15 2302 07/20/15 0500 07/20/15 1150 07/21/15 0635 07/22/15 0529  NA  --  158*  --   --  153*  --  151* 151* 149* 145  K  --  2.9*  < >  --  3.1* 3.6 3.6 3.6 3.5 3.5  CL  --  125*  --   --  123*  --  120* 121* 117* 115*  CO2  --  22  --   --  22  --  21* 21* 21* 21*  GLUCOSE  --  184*  --   --  332*  --  156* 168* 148* 112*  BUN  --  47*  --   --  44*  --  41* 41* 37* 35*  CREATININE  --  1.76*  --   --  1.74*  --  1.72* 1.68* 1.56* 1.52*  CALCIUM  --  9.4  --   --  9.2  --  9.4 9.1 9.5 8.9  MG 2.3 2.1  --  2.0  --   --   --  1.9  --   --   PHOS  --  2.9  --   --   --   --   --  2.9  --   --   < > = values in this interval not displayed.  Liver Function Tests: No results for input(s): AST, ALT, ALKPHOS, BILITOT, PROT, ALBUMIN in the last 168  hours. No results for input(s): LIPASE, AMYLASE in the last 168 hours. No results for input(s): AMMONIA in the last 168 hours.  CBC:  Recent Labs Lab 07/17/15 0938 07/18/15 0500 07/19/15 0632 07/20/15 0500 07/21/15 0635 07/22/15 0529  WBC 8.2 10.5 10.2 11.9* 10.3  --   HGB 6.7* 8.7* 8.7* 8.3* 8.4*  --   HCT 20.8* 26.4* 26.7* 25.9* 25.6*  --   MCV 88.0 85.9 88.5 88.3 89.0  --   PLT 126* 105* 124* 148* 113* 119*    Cardiac Enzymes: No results for input(s): CKTOTAL, CKMB, CKMBINDEX, TROPONINI in the last 168 hours.  BNP: Invalid input(s): POCBNP  CBG:  Recent Labs Lab 07/21/15 1618 07/21/15 2209 07/22/15 0816 07/22/15 1117 07/22/15 1627  GLUCAP 89 132* 101* 144* 113*    Microbiology: Results for orders placed or performed  during the hospital encounter of 07/10/15  Blood culture (routine x 2)     Status: None   Collection Time: 07/10/15  6:23 PM  Result Value Ref Range Status   Specimen Description BLOOD PICC LINE  Final   Special Requests BOTTLES DRAWN AEROBIC AND ANAEROBIC  8CC  Final   Culture NO GROWTH 10 DAYS  Final   Report Status 07/20/2015 FINAL  Final  Blood culture (routine x 2)     Status: None   Collection Time: 07/10/15  7:00 PM  Result Value Ref Range Status   Specimen Description BLOOD PICC LINE  Final   Special Requests BOTTLES DRAWN AEROBIC AND ANAEROBIC  5CC  Final   Culture NO GROWTH 10 DAYS  Final   Report Status 07/20/2015 FINAL  Final  Urine culture     Status: Abnormal   Collection Time: 07/10/15  8:04 PM  Result Value Ref Range Status   Specimen Description URINE, RANDOM  Final   Special Requests NONE  Final   Culture 50,000 COLONIES/mL CANDIDA ALBICANS (A)  Final   Report Status 07/13/2015 FINAL  Final  Urine culture     Status: Abnormal   Collection Time: 07/15/15  3:58 PM  Result Value Ref Range Status   Specimen Description URINE, CATHETERIZED  Final   Special Requests NONE  Final   Culture 20,000 COLONIES/mL CANDIDA ALBICANS  (A)  Final   Report Status 07/19/2015 FINAL  Final    Coagulation Studies: No results for input(s): LABPROT, INR in the last 72 hours.  Urinalysis: No results for input(s): COLORURINE, LABSPEC, PHURINE, GLUCOSEU, HGBUR, BILIRUBINUR, KETONESUR, PROTEINUR, UROBILINOGEN, NITRITE, LEUKOCYTESUR in the last 72 hours.  Invalid input(s): APPERANCEUR    Imaging: No results found.   Medications:   . dextrose 5 % with KCl 20 mEq / L 20 mEq (07/22/15 [id number])  . furosemide (LASIX) infusion Stopped (07/21/15 2127)   . sodium chloride   Intravenous Once  . ciprofloxacin  200 mg Intravenous Q12H  . doxycycline (VIBRAMYCIN) IV  100 mg Intravenous Q12H  . insulin aspart  0-5 Units Subcutaneous QHS  . insulin aspart  0-9 Units Subcutaneous TID WC  . insulin detemir  8 Units Subcutaneous QHS  . sodium chloride flush  10-40 mL Intracatheter Q12H  . sodium chloride flush  3 mL Intravenous Q12H   ipratropium-albuterol, ondansetron **OR** ondansetron (ZOFRAN) IV, sodium chloride flush  Assessment/ Plan:  [name] is a 82 y.o. black female with dementia, hypertension, diabetes mellitus type II, gout, who [facility]   1. Acute renal failure on chronic kidney disease stage III: due to acute exacerbation of CHF causing acute cardiorenal syndrome. Creatinine fluctuates with fluid status. Baseline most likely ~1.3 from 05/2015.  - Renal parameters trending towards baseline.  Continue to monitor Cr trend daily.    2. Hypernatremia, hypokalemia: . - Na now normalized at 145.  Continue to monitor daily for now. - continue D5W one additional day.  3. Acute exacerbation of systolic congestive heart failure: EF of 40-45%.  With anasarca with third spacing. And hypoalbuminia.  - now off lasix.    4. Chronic sacral osteomyelitis:  Antibiotics per primary team, on cipro/doxycycline.   5. Gross hematuria - Hold Lovenox - urine has cleared now     LOS: 12 Jasmine Briggs,  Jasmine Briggs 5/1/20176:04 PM

## 2015-07-23 LAB — GLUCOSE, CAPILLARY
GLUCOSE-CAPILLARY: 74 mg/dL (ref 65–99)
GLUCOSE-CAPILLARY: 112 mg/dL — AB (ref 65–99)
Glucose-Capillary: 83 mg/dL (ref 65–99)
Glucose-Capillary: 89 mg/dL (ref 65–99)

## 2015-07-23 LAB — BASIC METABOLIC PANEL
Anion gap: 9 (ref 5–15)
BUN: 34 mg/dL — AB (ref 6–20)
CO2: 21 mmol/L — ABNORMAL LOW (ref 22–32)
Calcium: 8.8 mg/dL — ABNORMAL LOW (ref 8.9–10.3)
Chloride: 112 mmol/L — ABNORMAL HIGH (ref 101–111)
Creatinine, Ser: 1.39 mg/dL — ABNORMAL HIGH (ref 0.44–1.00)
GFR calc Af Amer: 37 mL/min — ABNORMAL LOW (ref >60)
GFR, EST NON AFRICAN AMERICAN: 32 mL/min — AB (ref >60)
GLUCOSE: 84 mg/dL (ref 65–99)
POTASSIUM: 3.5 mmol/L (ref 3.5–5.1)
Sodium: 142 mmol/L (ref 135–145)

## 2015-07-23 LAB — MAGNESIUM: Magnesium: 1.9 mg/dL (ref 1.7–2.4)

## 2015-07-23 MED ORDER — POTASSIUM CL IN DEXTROSE 5% 20 MEQ/L IV SOLN
20.0000 meq | INTRAVENOUS | Status: DC
  Administered 2015-07-23 – 2015-07-24 (×2): 20 meq via INTRAVENOUS
  Filled 2015-07-23: qty 1000

## 2015-07-23 MED ORDER — NICOTINE 7 MG/24HR TD PT24
7.0000 mg | MEDICATED_PATCH | Freq: Every day | TRANSDERMAL | Status: DC
  Administered 2015-07-23 – 2015-07-26 (×4): 7 mg via TRANSDERMAL
  Filled 2015-07-23 (×4): qty 1

## 2015-07-23 MED ORDER — ALBUMIN HUMAN 25 % IV SOLN
25.0000 g | Freq: Two times a day (BID) | INTRAVENOUS | Status: DC
  Administered 2015-07-23 – 2015-07-26 (×7): 25 g via INTRAVENOUS
  Filled 2015-07-23 (×9): qty 100

## 2015-07-23 NOTE — Consult Note (Addendum)
WOC wound follow up  Reason for follow up:  ongoingStage 3 pressure injury to sacrum, present on admission. Patient has had recent decline in health.  Last albumin was 2.7  Poor PO intake.  Wound type:Stage 3 pressure injury, not improving Pressure Ulcer POA: Yes Measurement:2 cm x 0.5 cm  x 0.3 cm (appears to have started as a fissure and is now complicated with pressure) Wound ZOX:WRUEbed:pink and moist Drainage (amount, consistency, odor) Minimal serosanguinous No odor Periwound:Intact Foley catheter in place.  Dressing procedure/placement/frequency:Cleanse sacral wound with soap and water and pat dry.Gently fill wound bed with NS moist gauze.  Cover with dry 4x4 gauze and ABD pad.  Change daily. Will order mattress replacement with low air loss feature.  Will not follow at this time. Please re-consult if needed.  Maple HudsonKaren Alter Moss RN BSN CWON Pager [phone]

## 2015-07-23 NOTE — Progress Notes (Signed)
Pharmacy Consult for Electrolyte supplementation  Labs:  Recent Labs  07/21/15 0635 07/22/15 0529  WBC 10.3  --   HGB 8.4*  --   HCT 25.6*  --   PLT 113* 119*     Recent Labs  07/20/15 1150 07/21/15 0635 07/22/15 0529 07/23/15 0515  NA 151* 149* 145 142  K 3.6 3.5 3.5 3.5  CL 121* 117* 115* 112*  CO2 21* 21* 21* 21*  GLUCOSE 168* 148* 112* 84  BUN 41* 37* 35* 34*  CREATININE 1.68* 1.56* 1.52* 1.39*  CALCIUM 9.1 9.5 8.9 8.8*  MG 1.9  --   --  1.9  PHOS 2.9  --   --   --    Estimated Creatinine Clearance: 28.8 mL/min (by C-G formula based on Cr of 1.39).  Assessment: Pharmacy consulted to monitor and supplement electrolytes in a 82 yo female admitted with ulcers.  Patient with CHF and on furosemide infusion.   Plan:  Electrolytes within goal range. Will recheck electrolytes in 2 days.   Foye DeerLisa G Tonya Carlile, Pharm.D., BCPS Clinical Pharmacist 07/23/2015,10:44 AM

## 2015-07-23 NOTE — Evaluation (Signed)
Physical Therapy Evaluation Patient Details Name: Jasmine CaraSarah Briggs MRN: [id number] DOB: 1920-10-10 Today's Date: 07/23/2015   History of Present Illness  Pt is a 82 y.o. female (PMH of legally blind and dislocated R shoulder) presenting to hospital with AMS and diffuse swelling on 07/10/15 and admitted with acute combined systolic and diastolic CHF, PNA, and acute renal failure on chronic kidney disease stage III.  Pt also s/p 2 units PRBC transfusion.  Pt with recent admit with PNA and UTI.  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  Clinical Impression  Prior to admission, pt has not been able to assist with transfers since January 2017 per pt's daughter.  Pt lives with her son in 1 level home with ramp to enter.  Currently pt is total assist supine to L sidelying (with cueing and encouragement unable to get pt to participate with functional mobility at all and once on pt's side, pt stating "leave me alone" very clearly; pt otherwise did not say anything else during session except "move" when pt's daughter placed wet towel on pt's face to wake her up.  Pt appears to be at her most recent functional baseline but pt's daughter reporting that she wants to be able to progress pt back to being able to assist with transfers again.  D/t pt's poor level of participation, pt does not appear appropriate for PT intervention but d/t family wanting PT, will trial pt with PT in hospital with hope for seeing pt at more opportune time for participation.  D/t pt's dementia, anticipate carryover with PT interventions will be difficult.  Currently recommend pt discharge to home with 24/7 assist and use of hoyer for transfers when medically appropriate.     Follow Up Recommendations No PT follow up;Supervision/Assistance - 24 hour    Equipment Recommendations   (hoyer)    Recommendations for Other Services       Precautions / Restrictions Precautions Precautions: Fall Precaution  Comments: Aspiration; foley; R foot wound; sacral wound Restrictions Weight Bearing Restrictions: No      Mobility  Bed Mobility Overal bed mobility: Needs Assistance;+2 for physical assistance Bed Mobility: Rolling Rolling: Total assist;+2 for physical assistance         General bed mobility comments: supine to L sidelying; pt given cueing to assist but pt did not participate at all with mobility during session  Transfers                 General transfer comment: deferred d/t pt not participating in functional mobility  Ambulation/Gait                Stairs            Wheelchair Mobility    Modified Rankin (Stroke Patients Only)       Balance                                             Pertinent Vitals/Pain Pain Assessment: Faces Faces Pain Scale: Hurts a little bit Pain Location: pt did not report a pain location Pain Descriptors / Indicators: Grimacing Pain Intervention(s): Limited activity within patient's tolerance;Monitored during session;Repositioned    Home Living Family/patient expects to be discharged to:: Private residence Living Arrangements: Children (Pt's son) Available Help at Discharge: Family;Available 24 hours/day Type of Home: House Home Access: Ramped entrance     Home  Layout: One level Home Equipment: Wheelchair - manual;Hospital bed;Walker - 2 wheels      Prior Function Level of Independence: Needs assistance         Comments: per pt's daughter: pt has 24/7 care; total assist for all mobility since February 2017 (last time pt able to assist with transfers was January 2017; last time pt ambulated with assist was over a year ago in early 2016--stopped ambulating d/t R LE buckling)     Hand Dominance        Extremity/Trunk Assessment   Upper Extremity Assessment:  (pt did not follow any commands to move B UE's)           Lower Extremity Assessment:  (L DF 15 degrees short of neutral;  R DF to neutral; pt wiggled B toes 1 time (not to command) and resisted any LE movement and did not follow commands to move B LE's)         Communication   Communication:  (Pt's daughter reports pt normally only talks to people she knows)  Cognition Arousal/Alertness: Lethargic Behavior During Therapy: Flat affect Overall Cognitive Status: History of cognitive impairments - at baseline                      General Comments   Nursing cleared pt for participation in physical therapy.  Pt's daughter agreeable to PT session.     Exercises        Assessment/Plan    PT Assessment Patient needs continued PT services  PT Diagnosis Difficulty walking;Generalized weakness   PT Problem List Decreased strength;Decreased range of motion;Decreased activity tolerance;Decreased balance;Decreased mobility  PT Treatment Interventions DME instruction;Functional mobility training;Therapeutic activities;Therapeutic exercise;Balance training;Patient/family education   PT Goals (Current goals can be found in the Care Plan section) Acute Rehab PT Goals Patient Stated Goal: to be able to assist with transfers again PT Goal Formulation: With family (pt's daughter) Time For Goal Achievement: 08/06/15 Potential to Achieve Goals: Poor    Frequency  (PT trial (min 2x/week))   Barriers to discharge        Co-evaluation               End of Session   Activity Tolerance:  (Limited per pt's participation level) Patient left: in bed;with call bell/phone within reach;with bed alarm set (B heels elevated via pillow) Nurse Communication: Mobility status;Precautions         Time: 2956-2130 PT Time Calculation (min) (ACUTE ONLY): 23 min   Charges:   PT Evaluation $PT Eval Moderate Complexity: 1 Procedure     PT G CodesHendricks Limes 2015/08/05, 1:23 PM Hendricks Limes, PT [phone]

## 2015-07-23 NOTE — Progress Notes (Signed)
Unable to retrieve an axillary temperature. Rectal temperature is 94.5. Inserted rectal probe for continuous monitoring and applied bear hugger.

## 2015-07-23 NOTE — Progress Notes (Signed)
[facility]  ROUNDING NOTE   Subjective:  Creatinine currently down to 1.39. Patient resting comfortably in bed. Continues to have considerableedema however this has improved from admission. Remains on Lasix drip at 4 mg per hour.  Objective:  Vital signs in last 24 hours:  Temp:  [97 F (36.1 C)-98 F (36.7 C)] 98 F (36.7 C) (05/02 1125) Pulse Rate:  [67-70] 67 (05/02 1125) Resp:  [16-18] 18 (05/02 1125) BP: (102-108)/(67-90) 108/90 mmHg (05/02 1125) SpO2:  [97 %-100 %] 97 % (05/02 0605)  Weight change:  Filed Weights   07/10/15 1757 07/10/15 2305  Weight: 90.719 kg (200 lb) 84.188 kg (185 lb 9.6 oz)    Intake/Output: I/O last 3 completed shifts: In: 748 [I.V.:648; IV Piggyback:100] Out: 1700 [Urine:1700]   Intake/Output this shift:  Total I/O In: 240 [P.O.:240] Out: -   Physical Exam: General: NAD, laying in bed  Head: Normocephalic, atraumatic. Moist oral mucosal membranes  Eyes: Anicteric,  Neck: Supple, trachea midline  Lungs:  Basilar rales  Heart: Irregular rhythm  Abdomen:  Soft, nontender, obese  Extremities: ++ generalized edema.  Neurologic: Resting quietly  Skin: No lesions  GU: Foley with light yellow urine    Basic Metabolic Panel:  Recent Labs Lab 07/17/15 1226 07/18/15 0500  07/18/15 1822  07/20/15 0500 07/20/15 1150 07/21/15 0635 07/22/15 0529 07/23/15 0515  NA  --  158*  --   --   < > 151* 151* 149* 145 142  K  --  2.9*  < >  --   < > 3.6 3.6 3.5 3.5 3.5  CL  --  125*  --   --   < > 120* 121* 117* 115* 112*  CO2  --  22  --   --   < > 21* 21* 21* 21* 21*  GLUCOSE  --  184*  --   --   < > 156* 168* 148* 112* 84  BUN  --  47*  --   --   < > 41* 41* 37* 35* 34*  CREATININE  --  1.76*  --   --   < > 1.72* 1.68* 1.56* 1.52* 1.39*  CALCIUM  --  9.4  --   --   < > 9.4 9.1 9.5 8.9 8.8*  MG 2.3 2.1  --  2.0  --   --  1.9  --   --  1.9  PHOS  --  2.9  --   --   --   --  2.9  --   --   --   < > = values in this interval not  displayed.  Liver Function Tests: No results for input(s): AST, ALT, ALKPHOS, BILITOT, PROT, ALBUMIN in the last 168 hours. No results for input(s): LIPASE, AMYLASE in the last 168 hours. No results for input(s): AMMONIA in the last 168 hours.  CBC:  Recent Labs Lab 07/17/15 0938 07/18/15 0500 07/19/15 0632 07/20/15 0500 07/21/15 0635 07/22/15 0529  WBC 8.2 10.5 10.2 11.9* 10.3  --   HGB 6.7* 8.7* 8.7* 8.3* 8.4*  --   HCT 20.8* 26.4* 26.7* 25.9* 25.6*  --   MCV 88.0 85.9 88.5 88.3 89.0  --   PLT 126* 105* 124* 148* 113* 119*    Cardiac Enzymes: No results for input(s): CKTOTAL, CKMB, CKMBINDEX, TROPONINI in the last 168 hours.  BNP: Invalid input(s): POCBNP  CBG:  Recent Labs Lab 07/22/15 1117 07/22/15 1627 07/22/15 2049 07/23/15 0724 07/23/15 1124  GLUCAP 144* 113* 155* 83 89    Microbiology: Results for orders placed or performed during the hospital encounter of 07/10/15  Blood culture (routine x 2)     Status: None   Collection Time: 07/10/15  6:23 PM  Result Value Ref Range Status   Specimen Description BLOOD PICC LINE  Final   Special Requests BOTTLES DRAWN AEROBIC AND ANAEROBIC  8CC  Final   Culture NO GROWTH 10 DAYS  Final   Report Status 07/20/2015 FINAL  Final  Blood culture (routine x 2)     Status: None   Collection Time: 07/10/15  7:00 PM  Result Value Ref Range Status   Specimen Description BLOOD PICC LINE  Final   Special Requests BOTTLES DRAWN AEROBIC AND ANAEROBIC  5CC  Final   Culture NO GROWTH 10 DAYS  Final   Report Status 07/20/2015 FINAL  Final  Urine culture     Status: Abnormal   Collection Time: 07/10/15  8:04 PM  Result Value Ref Range Status   Specimen Description URINE, RANDOM  Final   Special Requests NONE  Final   Culture 50,000 COLONIES/mL CANDIDA ALBICANS (A)  Final   Report Status 07/13/2015 FINAL  Final  Urine culture     Status: Abnormal   Collection Time: 07/15/15  3:58 PM  Result Value Ref Range Status   Specimen  Description URINE, CATHETERIZED  Final   Special Requests NONE  Final   Culture 20,000 COLONIES/mL CANDIDA ALBICANS (A)  Final   Report Status 07/19/2015 FINAL  Final    Coagulation Studies: No results for input(s): LABPROT, INR in the last 72 hours.  Urinalysis: No results for input(s): COLORURINE, LABSPEC, PHURINE, GLUCOSEU, HGBUR, BILIRUBINUR, KETONESUR, PROTEINUR, UROBILINOGEN, NITRITE, LEUKOCYTESUR in the last 72 hours.  Invalid input(s): APPERANCEUR    Imaging: No results found.   Medications:   . dextrose 5 % with KCl 20 mEq / L 20 mEq (07/23/15 1500)  . furosemide (LASIX) infusion Stopped (07/21/15 2127)   . sodium chloride   Intravenous Once  . insulin aspart  0-5 Units Subcutaneous QHS  . insulin aspart  0-9 Units Subcutaneous TID WC  . insulin detemir  8 Units Subcutaneous QHS  . nicotine  7 mg Transdermal Daily  . sodium chloride flush  10-40 mL Intracatheter Q12H  . sodium chloride flush  3 mL Intravenous Q12H   ipratropium-albuterol, ondansetron **OR** ondansetron (ZOFRAN) IV, sodium chloride flush  Assessment/ Plan:  [name] is a 82 y.o. black female with dementia, hypertension, diabetes mellitus type II, gout, who [facility]   1. Acute renal failure on chronic kidney disease stage III: due to acute exacerbation of CHF causing acute cardiorenal syndrome. Creatinine fluctuates with fluid status. Baseline most likely ~1.3 from 05/2015.  - creatinine now seems to be back at its baseline at 1.3.  Okay to continue Lasix drip for now.  2. Hypernatremia, hypokalemia: . - sodium remains normal at 142.  Potassium also normal at 3.5despite Lasix drip.  Continue to monitor..  3. Acute exacerbation of systolic congestive heart failure: EF of 40-45%.  With anasarca with third spacing. And hypoalbuminia.  - patient now back on Lasix drip.  For some reason medication list in the note indicates that Lasix drip has been stopped however she was  on it this a.m. And we will continue this.    4. Chronic sacral osteomyelitis:  Antibiotics per primary team, on cipro/doxycycline.   5. Gross hematuria - Hold  Lovenox - urine has cleared now     LOS: 13 Jasmine Briggs 5/2/20173:32 PM

## 2015-07-23 NOTE — Plan of Care (Signed)
Problem: Physical Regulation: Goal: Ability to maintain a body temperature in the normal range will improve Outcome: Not Progressing Unable to maintain body temperature. Bear Hugger applied along with additional blankets.

## 2015-07-23 NOTE — Progress Notes (Addendum)
[facility] - [place] at [facility]   PATIENT NAME: Jasmine Briggs    MR#:  [id number]  DATE OF BIRTH:  [birth year]  SUBJECTIVE:  CHIEF COMPLAINT:   Chief Complaint  Patient presents with  . Altered Mental Status   The patient is more alert, is able to speak some and reactive to verbal stimulation. Better oral intake, per family members. Sodium level has Normalized, patient is able to answer yes and no questions. Blood pressure is low but stable. Urinary output on Lasix drip is about 1200 cc over the past 24 hours. Patient's family, son and daughter were present during my interview seemed to be more upbeat today, they request physical therapist intervention REVIEW OF SYSTEMS:  Unable to get ROS.  DRUG ALLERGIES:  No Known Allergies  VITALS:  Blood pressure 104/69, pulse 70, temperature 97.8 F (36.6 C), temperature source Axillary, resp. rate 16, height  (1.702 m), weight 84.188 kg (185 lb 9.6 oz), SpO2 97 %.  PHYSICAL EXAMINATION:  GENERAL:  82 y.o.-year-old patient lying in the bed with no acute distress. Somnolent in the bed, opens her eyes,  able to nod or shake head to questions, minimal verbalization EYES: blindness. HEENT: Head atraumatic, normocephalic.  NECK:  Supple, no jugular venous distention. No thyroid enlargement, no tenderness.  LUNGS: Diminished air entrance and breath sounds bilaterally, no wheezing, few bilateral rales. No use of accessory muscles of respiration. Diminished at bases, no dullness to percussion, although evaluation is limited due to patient's size/obesity CARDIOVASCULAR: S1, S2 normal. No murmurs, rubs, or gallops.  ABDOMEN: Soft, nontender, nondistended. Bowel sounds present. No organomegaly or mass.  EXTREMITIES: bilateral arm and leg edema 2-3+, no cyanosis, or clubbing.  NEUROLOGIC: unable to exam. PSYCHIATRIC: The patient opened eyes to verbal stimuli. Noncommunicative. SKIN: Sacral DU stage 3. Skin is thin, prone to  tears   LABORATORY PANEL:   CBC  Recent Labs Lab 07/21/15 0635 07/22/15 0529  WBC 10.3  --   HGB 8.4*  --   HCT 25.6*  --   PLT 113* 119*   ------------------------------------------------------------------------------------------------------------------  Chemistries   Recent Labs Lab 07/23/15 0515  NA 142  K 3.5  CL 112*  CO2 21*  GLUCOSE 84  BUN 34*  CREATININE 1.39*  CALCIUM 8.8*  MG 1.9   ------------------------------------------------------------------------------------------------------------------  Cardiac Enzymes No results for input(s): TROPONINI in the last 168 hours. ------------------------------------------------------------------------------------------------------------------  RADIOLOGY:  No results found.  EKG:   Orders placed or performed during the hospital encounter of 07/10/15  . EKG 12-Lead  . EKG 12-Lead    ASSESSMENT AND PLAN:   Acute Combined systolic/diastolic CHF (congestive heart failure) (HCC) . Continue Lasix drip, kidney function has improved, close to baseline.. Urinary output is 1200 cc over the past 24 hours. Appreciate cardiology's input.  Medical condition was discussed with patient's son and daughter were present during my interview at the bedside, all questions were answered, they voiced understanding.   Anasarca. gentle diuresis with  Lasix drip  per nephrology recommendations.Urinary output is satisfactory  Sacral DU stage 3, wound care to be continued.  Acute  on chronic renal failure due to ATN with a known history of chronic kidney disease stage III, stable/better today  . Continue  lasix drip per nephrologist, following kidney function closely .   Hypernatremia. secondary  free water deficit, loop diuretics. Resolved. Patient's family is not very much interested to discuss further care with Dr. Orvan Falconer , unfortunately, more over they are not able  to make decisions with regards to her care, and I'm afraid that  patient will continue in and out hospitalization over the next few months.   Hypokalemia. Resolved with KCl supplementation.  Magnesium is normal.  Sepsis, due to sacral osteomyelitis, Aspiration pneumonia and UTI.She has been treated with Zosyn, vancomycin , initially.  Negative Blood cultures so far. urine cultures: CANDIDA ALBICANS.Marland Kitchen. Patient was seen by Dr. Sampson GoonFitzgerald and recommended oral/intravenous Cipro and doxycycline, also Diflucan. Patient completed antibiotic/antifungal therapy , according to  Dr. Jarrett AblesFitzgerald's recommendations.  metabolic encephalopathy and Dementia. Continue dysphagia 1 diet per speech study. Aspiration precautions. Calorie count is initiated  to help to discuss palliative/comfort care.   Hypotension with history of hypertension , holding antihypertensives for now. No IV fluids due to anasarca/CHF  Type 2 diabetes mellitus (HCC) - sliding scale insulin with corresponding glucose checks. Blood glucose levels are 89-150, continue current dose of  Levemir 8 units daily. No hypoglycemia symptoms or signs at this time, patient's oral intake has improved with patient's family, advance medications as needed  Anemia of chronic disease and of acute blood loss due to hematuria.  Status post 2 units PRBC transfusion during this admission . No active bleeding was noted recently since Lovenox was stopped. Follow hemoglobin level periodically .  Hematuria. Holding Lovenox. Improved.  Failure to thrive, adult due to underlying dementia, metabolic encephalopathy, calorie count to be continued, which may help to guide with further decision making. A shin several intake is not satisfactory and not sufficient to maintain life, discussed with dietitian today. We will need to discuss this family in regards to their wishes for percutaneous gastric tube placement  Generalized weakness, continue physical therapy, follow progress, likely skilled nursing facility placement within next few days.    I discussed with Dr. Cherylann RatelLateef  Physical therapy evaluation suggested skilled nursing facility placement. All the records are reviewed and case discussed with Care Management/Social Workerr. Management plans discussed with the patient's daughter and son and they are in agreement.  CODE STATUS: DNR  TOTAL TIME TAKING CARE OF THIS PATIENT:  40 minutes.  Greater than 50% time was spent on coordination of care and face-to-face counseling, discussed with patient's son and daughter today, all questions were answered. Discussed with dietitian POSSIBLE D/C IN >3 DAYS, DEPENDING ON CLINICAL CONDITION.   Katharina CaperVAICKUTE,Rollyn Scialdone M.D on 07/23/2015 at 11:00 AM  Between 7am to 6pm - Pager - [phone]  After 6pm go to [email or web address] - password [email or web address] [facility]  [facility]  Office  [phone]  CC: Primary care physician; Kurtis BushmanGLADMAN, CHRISTINE D, MD

## 2015-07-23 NOTE — Progress Notes (Signed)
Changed sacral dressing.  Re-consulted wound care as I believe wound care orders should be readdressed since initial order on 4/20.  Spoke to Clydie BraunKaren (wound care nurse), she is going to change the orders and order a specialty bed.  Will see patient tomorrow on 5/3

## 2015-07-23 NOTE — Progress Notes (Signed)
Calorie Count Note  Calorie Count continues (ordered initially on 07/17/15)  Diet: Dysphagia I, Nectar Thick Supplements: Mining engineerMagic Cup, [employer] Hughes IncorporatedMighty Shakes  Breakfast: 1/2 egg whites scrambled, 3/4 oatmeal, 3/4 yogurt, 1 nectar OJ, 3 bites of Magic Cup (360 kcals, 14 g protein) Lunch: 1/4 puree roast beef, 1/3 mashed potatoes, 1/4 puree green beans, 1 nectar juice (230 kcals, 6.5 g of protein) Dinner: 1/2 Puree Roast Pork, 1/4 mashed sweet potatoes, 1 bite of broccoli, all of vanilla pudding, 2 Juices (290 kcals, 15 g of protein) Supplements between meals: 4 bites of Magic Cup (75 kcals, 2 g of protein) Between meals: 4 additional juices, 1 water (285 kcals, 0 g of protein)  Total intake in past 24 hours: 1240 kcal (59% of minimum estimated needs)  37.5 g protein (38%% of minimum estimated needs)  Average intake since calorie count initiated: 673 kcal (32% of minimum estimated needs) 18.8 g protein (18.9% of minimum estimated needs)  Intervention:  -Pt continues to not meet nutritional needs despite calorie intake improvement in past 24 hours. Pt continues with insufficient protein intake to promote wound healing. Discussed with MD Winona LegatoVaickute; MD plans to discuss with family tomorrow. In the mean time, calorie count to continue per MD.  -Discussed importance of protein intake via puree meat and eggs, dairy products, nutritional supplements (magic cup, mighty shakes) with pt's family with regards to wound healing. Family verbalized understanding and plan to encourage protein during meals and between meals -Recommend addition of MVI -D5 continues at rate of 50 ml/hr secondary to hypernatremia, sodium improved; MD Vaickute gave order to decrease to rate of 25 ml/hr. Encourage po intake of nectar thick beverages but may be difficult to meet hydration needs with poor po intake. V-8 juice and buttermilk removed from trays starting yesterday. Discussed this concern with MD Vaickute as well  [name] MS,  RD, LDN [phone] Pager  [phone] Weekend/On-Call Pager

## 2015-07-24 LAB — GLUCOSE, CAPILLARY
GLUCOSE-CAPILLARY: 71 mg/dL (ref 65–99)
GLUCOSE-CAPILLARY: 136 mg/dL — AB (ref 65–99)
GLUCOSE-CAPILLARY: 124 mg/dL — AB (ref 65–99)
Glucose-Capillary: 50 mg/dL — ABNORMAL LOW (ref 65–99)
Glucose-Capillary: 80 mg/dL (ref 65–99)

## 2015-07-24 MED ORDER — DEXTROSE 50 % IV SOLN
1.0000 | Freq: Once | INTRAVENOUS | Status: AC
  Administered 2015-07-24: 50 mL via INTRAVENOUS
  Filled 2015-07-24 (×2): qty 50

## 2015-07-24 MED ORDER — POTASSIUM CL IN DEXTROSE 5% 20 MEQ/L IV SOLN
20.0000 meq | INTRAVENOUS | Status: DC
  Administered 2015-07-24: 20 meq via INTRAVENOUS
  Filled 2015-07-24 (×4): qty 1000

## 2015-07-24 NOTE — Progress Notes (Signed)
Calorie Count Note  Calorie count continues (ordered initially on 07/17/2015)  Diet: Dysphagia 1, Nectar Thick Supplements: Special educational needs teacherMagic Cup and Mighty Shakes  Breakfast: 100% OJ, 1/2 yogurt, 1/2 egg whites scrambled, 1/4 of grits (160 kcals and 6 g of protein) Lunch: few bites chocolate pudding, 100% cranberry juice, 2 spoonfuls puree chicken, 3 spoonfuls mashed potatoes, 2 spoonfuls puree green beans (190 kcals and 4 g of protein) Dinner: pat margarine, 100% cranberry juice, 100% puree chicken, 3/4 mashed potatoes, 1/2 green beans, 100% thickened water and apple juice (480 kcals and 8 g of protein)  Supplements/snacks: 1/3 magic cup, 2 nectar thick juices 240 kcals and 3 g of protein  Total intake: 1070 kcal (50% of minimum estimated needs)  18 g  protein (18% of minimum estimated needs)   Intervention: -Pt continues to not meet nutritional needs with insufficient protein intake to promote wound healing. Discussed calorie count results with MD Winona LegatoVaickute and MD agreeable to discontinuing calorie count at this time.  Palliative care re-consulted.    Wandy Bossler B. Freida BusmanAllen, RD, LDN [phone] (pager) Weekend/On-Call pager [phone])

## 2015-07-24 NOTE — Progress Notes (Signed)
[facility] [place] [facility]   PATIENT NAME: Jasmine CaraSarah Briggs    MR#:  [id number]  DATE OF BIRTH:  June 24, 1920  SUBJECTIVE:  CHIEF COMPLAINT:   Chief Complaint  Patient presents with  . Altered Mental Status  The patient is Somnolent again, although was able to talk some in the morning with patient's family, minimal oral intake, although was not able to wake up to eat breakfast. The patient was noted to be hypoglycemic today with blood glucose level of 50, while off D5 solution .  Urinary output on Lasix drip is about 1650cc over the past 24 hours. Patient's daughter is present during my evaluation/discussion REVIEW OF SYSTEMS:  Unable to get ROS.  DRUG ALLERGIES:  No Known Allergies  VITALS:  Blood pressure 113/50, pulse 71, temperature 98.2 F (36.8 C), temperature source Rectal, resp. rate 18, height 5\' 7"  (1.702 m), weight 84.188 kg (185 lb 9.6 oz), SpO2 94 %.  PHYSICAL EXAMINATION:  GENERAL:  82 y.o.-year-old patient lying in the bed with no acute distress. Somnolent in the bed, Not opening her eyes, non-conversive, labored breathing EYES: blindness. HEENT: Head atraumatic, normocephalic.  NECK:  Supple, no jugular venous distention. No thyroid enlargement, no tenderness.  LUNGS: Relatively good air entrance and breath sounds bilaterally, no wheezing, few bilateral rales. No use of accessory muscles of respiration. Diminished at bases, no dullness to percussion, although evaluation is limited due to patient's size/obesity CARDIOVASCULAR: S1, S2 normal, rhythm was regular. No murmurs, rubs, or gallops.  ABDOMEN: Soft, nontender, nondistended. Bowel sounds present. No organomegaly or mass.  EXTREMITIES: bilateral arm and leg edema 2+, no cyanosis, or clubbing.  NEUROLOGIC: unable to exam due to significant somnolence. PSYCHIATRIC: The patient is more somnolent today, lethargic, not opening her eyes, does not converse. SKIN: Sacral DU stage 3. Skin is  thin, prone to tears   LABORATORY PANEL:   CBC  Recent Labs Lab 07/21/15 0635 07/22/15 0529  WBC 10.3  --   HGB 8.4*  --   HCT 25.6*  --   PLT 113* 119*   ------------------------------------------------------------------------------------------------------------------  Chemistries   Recent Labs Lab 07/23/15 0515  NA 142  K 3.5  CL 112*  CO2 21*  GLUCOSE 84  BUN 34*  CREATININE 1.39*  CALCIUM 8.8*  MG 1.9   ------------------------------------------------------------------------------------------------------------------  Cardiac Enzymes No results for input(s): TROPONINI in the last 168 hours. ------------------------------------------------------------------------------------------------------------------  RADIOLOGY:  No results found.  EKG:   Orders placed or performed during the hospital encounter of 07/10/15  . EKG 12-Lead  . EKG 12-Lead    ASSESSMENT AND PLAN:   Acute Combined systolic/diastolic CHF (congestive heart failure) (HCC) . Continue Lasix drip, kidney function has improved, close to baseline.. Urinary output is 1650 cc over the past 24 hours. Appreciate nephrology input.  Medical condition was discussed with patient's daughter again. Palliative care reconsulted.  Anasarca. gentle diuresis with  Lasix drip  per nephrology recommendations.Urinary output is satisfactory. Clinically improved  Sacral DU stage 3, wound care to be continued.  Acute  on chronic renal failure due to ATN with a known history of chronic kidney disease stage III . Continue  lasix drip per nephrologist, following kidney function closely, check labs tomorrow morning .   Hypernatremia. secondary  free water deficit, loop diuretics. Resolved on D5 water solution, now on lower D5. rate. Recheck labs tomorrow morning. Although Patient's family  was not very much interested to discuss further care with Dr. Orvan Falconerampbell , we'll reconsult  palliative care again due to new  hypoglycemia, poor oral intake, not sufficient enough to sustain life   Hypokalemia. Resolved with KCl supplementation.  Magnesium is normal.  Sepsis, due to sacral osteomyelitis, Aspiration pneumonia and UTI.She has been treated with Zosyn, vancomycin , initially.  Negative Blood cultures so far. urine cultures: CANDIDA ALBICANS.Marland Kitchen Patient was seen by Dr. Sampson Goon and recommended oral/intravenous Cipro and doxycycline, also Diflucan. Patient completed antibiotic/antifungal therapy , according to  Dr. Jarrett Ables recommendations.  metabolic encephalopathy and Dementia. Continue dysphagia 1 diet per speech study. Aspiration precautions. I discussed patient's calorie count with dietitian yesterday, it appears the patient does not take enough calories to sustain life.   Hypotension with history of hypertension , holding antihypertensives for now. No IV fluids due to anasarca/CHF  Type 2 diabetes mellitus (HCC) with hypoglycemia despite D5 water solution ,discontinue  sliding scale insulin,   Levemir. . Follow blood glucose levels closely, as well as hypoglycemia symptoms or signs at this time, patient's oral intake   Anemia of chronic disease and of acute blood loss due to hematuria.  Status post 2 units PRBC transfusion during this admission . No active bleeding was noted recently since Lovenox was stopped. Follow hemoglobin level periodically .  Hematuria. Holding Lovenox. Improved.  Failure to thrive, adult due to underlying dementia, metabolic encephalopathy, calorie count to be continued, which may help to guide with further decision making.  Actually oral  intake is not satisfactory and not sufficient to maintain life, discussed with dietitian today and patient's daughter today. Palliative care to discuss with family if they desire percutaneous gastric tube placement due to patient's malnutrition, failure to thrive   Generalized weakness, continue physical therapy, if possible due to altered  mental status/dementia   I discussed with Dr. Cherylann Ratel  Physical therapy evaluation suggested skilled nursing facility placement. All the records are reviewed and case discussed with Care Management/Social Workerr. Management plans discussed with the patient's daughter and son and they are in agreement.  CODE STATUS: DNR  TOTAL TIME TAKING CARE OF THIS PATIENT:  40 minutes.  Greater than 50% time was spent on coordination of care and face-to-face counseling, discussed with patient's daughter today, all questions were answered. Called and discussed with Dr. Orvan Falconer, palliative care physician, care management  POSSIBLE D/C IN >3 DAYS, DEPENDING ON CLINICAL CONDITION.   Katharina Caper M.D on 07/24/2015 at 12:47 PM  Between 7am to 6pm - Pager - [phone]  After 6pm go to [email or web address] - password [email or web address] [facility]  [facility] [facility]  Office  [phone]  CC: Primary care physician; Kurtis Bushman, MD

## 2015-07-24 NOTE — Progress Notes (Signed)
PT Cancellation Note  Patient Details Name: Jasmine CaraSarah Shively MRN: [id number] DOB: [birth year]   Cancelled Treatment:    Reason Eval/Treat Not Completed: Fatigue/lethargy limiting ability to participate. Treatment attempted for nearly 10 minutes per family request. Numerous family members in room all attempting to awaken pt. Pt extremely lethargic and only able to open eyes briefly without focus on anyone/thing before rolling back and closing. Pt breathing heavily. Unable to follow any instruction for movement of either leg with assist. Discussion with family regarding pt's usual schedule, as they note she was very awake and alert most of the day yesterday and earlier this morning. May have more success with attempted morning treatment tomorrow as the schedule allows.    Kristeen MissHeidi Elizabeth Bishop, VirginiaPTA 07/24/2015, 3:23 PM

## 2015-07-24 NOTE — Care Management (Signed)
Dr. Orvan Falconerampbell is to complete another consult on patient to assist in developing some goals of care with family.  Dr. Winona LegatoVaickute was to DC D 5 but patient became hypoglycemic and it had to be restarted.

## 2015-07-24 NOTE — Progress Notes (Signed)
Rectal temperature is at 98.5 , bear hugger at 38 degrees celsius.

## 2015-07-24 NOTE — Progress Notes (Signed)
Patient 1200 CBG 50. Patient is only responsive to painful stimuli. Notified Dr. Winona LegatoVaickute. Per MD give 1 amp of D50. D50 given. CBG after given 1 amp of D50 was 136. Will continue to monitor patient. Rudean Haskellonnisha R Jesse Hirst

## 2015-07-24 NOTE — Care Management Important Message (Signed)
Important Message  Patient Details  Name: Jasmine Briggs MRN: [id number] Date of Birth: 06/02/1920   Medicare Important Message Given:  Yes    Olegario MessierKathy A Reita Shindler 07/24/2015, 2:16 PM

## 2015-07-25 LAB — GLUCOSE, CAPILLARY
GLUCOSE-CAPILLARY: 189 mg/dL — AB (ref 65–99)
GLUCOSE-CAPILLARY: 192 mg/dL — AB (ref 65–99)
Glucose-Capillary: 119 mg/dL — ABNORMAL HIGH (ref 65–99)
Glucose-Capillary: 173 mg/dL — ABNORMAL HIGH (ref 65–99)

## 2015-07-25 LAB — BASIC METABOLIC PANEL
Anion gap: 11 (ref 5–15)
BUN: 28 mg/dL — ABNORMAL HIGH (ref 6–20)
CO2: 21 mmol/L — AB (ref 22–32)
Calcium: 8.9 mg/dL (ref 8.9–10.3)
Chloride: 107 mmol/L (ref 101–111)
Creatinine, Ser: 1.26 mg/dL — ABNORMAL HIGH (ref 0.44–1.00)
GFR, EST AFRICAN AMERICAN: 42 mL/min — AB (ref >60)
GFR, EST NON AFRICAN AMERICAN: 36 mL/min — AB (ref >60)
Glucose, Bld: 96 mg/dL (ref 65–99)
Potassium: 3.5 mmol/L (ref 3.5–5.1)
SODIUM: 139 mmol/L (ref 135–145)

## 2015-07-25 NOTE — Care Management (Signed)
Dr. Ether Griffins, Posey Pronto (unit director) and myself met with son Jasmine Briggs at bedside. Dr. Ether Griffins discussed end of life care and poor prognosis with son. She thoroughly explained that patient was not improving and she was declining rapidly. Son was adamant that the he and the family were not willing to speak with palliative care (Dr. Megan Salon) again. After son discussion so stated he and family would be willing to hear from a hospice representative as to what services they can provide. Offered agency preference and son did not have one. CM requested a family meeting today with Dr. Ether Griffins, [place], hospice representative and director. Will meet back with son at 60 to set a time to meet. Notified Marolyn Hammock, [facility] of meeting request.  Will follow up at 11am.

## 2015-07-25 NOTE — Progress Notes (Signed)
[place] at [place] NAME: Jasmine Briggs    MR#:  [id number]  DATE OF BIRTH:  [birth year]  SUBJECTIVE:  CHIEF COMPLAINT:   Chief Complaint  Patient presents with  . Altered Mental Status  The patient is more  Alert today, able to communicate some, better oral intake. Blood glucose level remains low but stable, patient remains on D5 solution at 50 cc an hour. Sodium level remains normal while on D5 water solution.  REVIEW OF SYSTEMS:  Unable to get ROS.  DRUG ALLERGIES:  No Known Allergies  VITALS:  Blood pressure 135/98, pulse 70, temperature 96 F (35.6 C), temperature source Oral, resp. rate 17, height _0  (1.702 m), weight 83.915 kg (185 lb), SpO2 95 %.  PHYSICAL EXAMINATION:  GENERAL:  82 y.o.-year-old patient lying in the bed with no acute distress. Somnolent , But opens her eyes and mumbles  EYES: blindness. HEENT: Head atraumatic, normocephalic.  NECK:  Supple, no jugular venous distention. No thyroid enlargement, no tenderness.  LUNGS: Some diminished air entrance and breath sounds on the right anteriorly at base, good air entrance on the left, no wheezing. No use of accessory muscles of respiration. Diminished at bases, no dullness to percussion, although evaluation is limited due to patient's size/obesity CARDIOVASCULAR: S1, S2 normal, rhythm was regular. No murmurs, rubs, or gallops.  ABDOMEN: Soft, nontender, nondistended. Bowel sounds present. No organomegaly or mass.  EXTREMITIES: bilateral arm and leg edema 2+, no cyanosis, or clubbing.  NEUROLOGIC: unable to exam due to significant somnolence. PSYCHIATRIC: The patient is somnolent , but opens her eyes, able to answer yes and no questions intermittently, does not otherwise follow commands, does not verbalize, but mumbles SKIN: Sacral DU stage 3. Skin is thin, prone to tears   LABORATORY PANEL:   CBC  Recent Labs Lab 07/21/15 0635 07/22/15 0529  WBC 10.3  --    HGB 8.4*  --   HCT 25.6*  --   PLT 113* 119*   ------------------------------------------------------------------------------------------------------------------  Chemistries   Recent Labs Lab 07/23/15 0515 07/25/15 0404  NA 142 139  K 3.5 3.5  CL 112* 107  CO2 21* 21*  GLUCOSE 84 96  BUN 34* 28*  CREATININE 1.39* 1.26*  CALCIUM 8.8* 8.9  MG 1.9  --    ------------------------------------------------------------------------------------------------------------------  Cardiac Enzymes No results for input(s): TROPONINI in the last 168 hours. ------------------------------------------------------------------------------------------------------------------  RADIOLOGY:  No results found.  EKG:   Orders placed or performed during the hospital encounter of 07/10/15  . EKG 12-Lead  . EKG 12-Lead    ASSESSMENT AND PLAN:   Acute Combined systolic/diastolic CHF (congestive heart failure) ([place]) . Continue Lasix drip, kidney function has Been stable, at baseline.. Urinary output is 1500 cc over the past 24 hours. Appreciate nephrology input.  Medical condition was discussed with patient's son . Patient's family apparently is not willing to discuss with Dr. Megan Salon, family meeting was arranged for today by care management to discuss further care, discharge planning. I discussed with patient's son, possibility of hospice, he is willing to listen to hospice specialist about provided care  Anasarca. gentle diuresis with  Lasix drip  per nephrology recommendations.Urinary output is satisfactory. Clinically improved  Sacral DU stage 3, wound care to be continued, unless palliative care.  Acute  on chronic renal failure due to ATN with a known history of chronic kidney disease stage III. Patient's creatinine is stable, urinary output remains stable with Lasix . Continue  lasix drip per nephrologist, following kidney function periodically, unless patient decides on palliation of  care  Hypernatremia. secondary  free water deficit, loop diuretics. Resolved on D5 water solution, now on D5. 50 cc an hour. Unfortunately, we were not able to wean off D5 water solution due to hypoglycemia episode yesterday, repeated sodium level remains stable  Hypokalemia. Resolved with KCl supplementation.  Magnesium is normal.  Sepsis, due to sacral osteomyelitis, Aspiration pneumonia and UTI.She has been treated with Zosyn, vancomycin , initially.  Negative Blood cultures so far. urine cultures: CANDIDA ALBICANS.Marland Kitchen Patient was seen by Dr. Ola Spurr and recommended oral/intravenous Cipro and doxycycline, also Diflucan. Patient completed antibiotic/antifungal therapy , according to  Dr. Blane Ohara recommendations.  metabolic encephalopathy due to hypoglycemia, multiple metabolic derangements in the setting of Dementia. Continue dysphagia 1 diet per speech study. Aspiration precautions. I discussed patient's calorie count with dietitian , few days ago, it appears the patient does not take enough calories to sustain life. I discussed this with patient's son, family meeting is scheduled for later today to discuss advanced care planning  Hypotension with history of hypertension , due to hypoalbuminemia, malnutrition, holding antihypertensives for now. No IV fluids due to anasarca/CHF  Type 2 diabetes mellitus (HCC) with hypoglycemia due to poor oral intake, insulin despite D5 water solution ,discontinued  sliding scale insulin,   Levemir now. . Stable blood glucose levels today, some better oral intake very likely due to patient being admitted to get more alert today  Anemia of chronic disease and of acute blood loss due to hematuria.  Status post 2 units PRBC transfusion during this admission . No active bleeding was noted recently since Lovenox was stopped. Follow hemoglobin level periodically .  Hematuria. Holding Lovenox. Improved.  Failure to thrive, adult, malnutrition due to underlying  dementia, metabolic encephalopathy, calorie count reveals unsatisfactory oral  Intake,  not sufficient to maintain life, discussed with patient's son today.   Generalized weakness, continue physical therapy, if possible due to altered mental status/dementia   I discussed with nursing staff, nursing supervisor, Management, palliative care, Dr. Megan Salon Physical therapy evaluation suggested skilled nursing facility placement. All the records are reviewed and case discussed with Care Management/Social Workerr. Management plans discussed with the patient's daughter and son and they are in agreement.  CODE STATUS: DNR  TOTAL TIME TAKING CARE OF THIS PATIENT:  35 minutes.     Advance care planning:   I have met with patient's son, Herbie Baltimore to discuss patient's condition, explained to him that patient's oral intake precludes her from thriving and ultimately will lead to demise. I explained to him palliative care options including hospice care. I answered all of his questions. He agreed to gather family members for family meeting later today. Time spent 20 additional minutes  POSSIBLE D/C IN >3 DAYS, DEPENDING ON CLINICAL CONDITION.   Theodoro Grist M.D on 07/25/2015 at 1:02 PM  Between 7am to 6pm - Pager - [phone]  After 6pm go to [email or web address] - password [email or web address] [facility]  [place] [facility]  Office  [phone]  CC: Primary care physician; Caryl Never, MD

## 2015-07-25 NOTE — Progress Notes (Signed)
[facility]  ROUNDING NOTE   Subjective:  The patient continues to diurese quite well. Urine output was 1.5 L over the preceding 24 hours. Renal function also stable with a creatinine of 1.2. However her general condition has not significantly improved.  Objective:  Vital signs in last 24 hours:  Temp:  [96 F (35.6 C)-98.9 F (37.2 C)] 96 F (35.6 C) (05/04 1153) Pulse Rate:  [69-71] 70 (05/04 1153) Resp:  [16-18] 17 (05/04 1153) BP: (114-138)/(47-98) 135/98 mmHg (05/04 1153) SpO2:  [95 %-97 %] 95 % (05/04 1153) Weight:  [83.915 kg (185 lb)] 83.915 kg (185 lb) (05/04 0438)  Weight change:  Filed Weights   07/10/15 1757 07/10/15 2305 07/25/15 0438  Weight: 90.719 kg (200 lb) 84.188 kg (185 lb 9.6 oz) 83.915 kg (185 lb)    Intake/Output: I/O last 3 completed shifts: In: 1677.7 [I.V.:1377.7; IV Piggyback:300] Out: 2250 [Urine:2250]   Intake/Output this shift:  Total I/O In: 120 [P.O.:120] Out: 400 [Urine:400]  Physical Exam: General: NAD, laying in bed  Head: Normocephalic, atraumatic. Moist oral mucosal membranes  Eyes: Anicteric,  Neck: Supple, trachea midline  Lungs:  Basilar rales normal effort  Heart: Irregular rhythm  Abdomen:  Soft, nontender, obese  Extremities: ++ generalized edema.  Neurologic: Resting quietly  Skin: No lesions  GU: Foley with light yellow urine    Basic Metabolic Panel:  Recent Labs Lab 07/18/15 1822  07/20/15 1150 07/21/15 0635 07/22/15 0529 07/23/15 0515 07/25/15 0404  NA  --   < > 151* 149* 145 142 139  K  --   < > 3.6 3.5 3.5 3.5 3.5  CL  --   < > 121* 117* 115* 112* 107  CO2  --   < > 21* 21* 21* 21* 21*  GLUCOSE  --   < > 168* 148* 112* 84 96  BUN  --   < > 41* 37* 35* 34* 28*  CREATININE  --   < > 1.68* 1.56* 1.52* 1.39* 1.26*  CALCIUM  --   < > 9.1 9.5 8.9 8.8* 8.9  MG 2.0  --  1.9  --   --  1.9  --   PHOS  --   --  2.9  --   --   --   --   < > = values in this interval not displayed.  Liver  Function Tests: No results for input(s): AST, ALT, ALKPHOS, BILITOT, PROT, ALBUMIN in the last 168 hours. No results for input(s): LIPASE, AMYLASE in the last 168 hours. No results for input(s): AMMONIA in the last 168 hours.  CBC:  Recent Labs Lab 07/19/15 0632 07/20/15 0500 07/21/15 0635 07/22/15 0529  WBC 10.2 11.9* 10.3  --   HGB 8.7* 8.3* 8.4*  --   HCT 26.7* 25.9* 25.6*  --   MCV 88.5 88.3 89.0  --   PLT 124* 148* 113* 119*    Cardiac Enzymes: No results for input(s): CKTOTAL, CKMB, CKMBINDEX, TROPONINI in the last 168 hours.  BNP: Invalid input(s): POCBNP  CBG:  Recent Labs Lab 07/24/15 1207 07/24/15 1651 07/24/15 2110 07/25/15 0752 07/25/15 1146  GLUCAP 136* 80 124* 119* 173*    Microbiology: Results for orders placed or performed during the hospital encounter of 07/10/15  Blood culture (routine x 2)     Status: None   Collection Time: 07/10/15  6:23 PM  Result Value Ref Range Status   Specimen Description BLOOD PICC LINE  Final   Special Requests  BOTTLES DRAWN AEROBIC AND ANAEROBIC  8CC  Final   Culture NO GROWTH 10 DAYS  Final   Report Status 07/20/2015 FINAL  Final  Blood culture (routine x 2)     Status: None   Collection Time: 07/10/15  7:00 PM  Result Value Ref Range Status   Specimen Description BLOOD PICC LINE  Final   Special Requests BOTTLES DRAWN AEROBIC AND ANAEROBIC  5CC  Final   Culture NO GROWTH 10 DAYS  Final   Report Status 07/20/2015 FINAL  Final  Urine culture     Status: Abnormal   Collection Time: 07/10/15  8:04 PM  Result Value Ref Range Status   Specimen Description URINE, RANDOM  Final   Special Requests NONE  Final   Culture 50,000 COLONIES/mL CANDIDA ALBICANS (A)  Final   Report Status 07/13/2015 FINAL  Final  Urine culture     Status: Abnormal   Collection Time: 07/15/15  3:58 PM  Result Value Ref Range Status   Specimen Description URINE, CATHETERIZED  Final   Special Requests NONE  Final   Culture 20,000  COLONIES/mL CANDIDA ALBICANS (A)  Final   Report Status 07/19/2015 FINAL  Final    Coagulation Studies: No results for input(s): LABPROT, INR in the last 72 hours.  Urinalysis: No results for input(s): COLORURINE, LABSPEC, PHURINE, GLUCOSEU, HGBUR, BILIRUBINUR, KETONESUR, PROTEINUR, UROBILINOGEN, NITRITE, LEUKOCYTESUR in the last 72 hours.  Invalid input(s): APPERANCEUR    Imaging: No results found.   Medications:   . dextrose 5 % with KCl 20 mEq / L 20 mEq (07/24/15 1231)  . furosemide (LASIX) infusion 4 mg/hr (07/24/15 1233)   . sodium chloride   Intravenous Once  . albumin human  25 g Intravenous Q12H  . nicotine  7 mg Transdermal Daily  . sodium chloride flush  10-40 mL Intracatheter Q12H  . sodium chloride flush  3 mL Intravenous Q12H   ipratropium-albuterol, ondansetron **OR** ondansetron (ZOFRAN) IV, sodium chloride flush  Assessment/ Plan:  [name] is a 82 y.o. black female with dementia, hypertension, diabetes mellitus type II, gout, who was admitted to [facility] on 07/10/2015   1. Acute renal failure on chronic kidney disease stage III: due to acute exacerbation of CHF causing acute cardiorenal syndrome. Creatinine fluctuates with fluid status. Baseline most likely ~1.3 from 05/2015.  - creatinine remained stable at 1.2. Okay to continue Lasix drip while still here.  2. Hypernatremia, hypokalemia: . - sodium down to 139.  Serum potassium 3.5.  Both currently acceptable.  3. Acute exacerbation of systolic congestive heart failure: EF of 40-45%.  With anasarca with third spacing. And hypoalbuminia.  - we will continueLasix drip while the patient remains here in a palliative effort.   4. Chronic sacral osteomyelitis:  Previously treated with Cipro and doxycycline.  5. Gross hematuria - Hold Lovenox - urine has cleared now     LOS: 15 Jasmine Briggs 5/4/201712:16 PM

## 2015-07-25 NOTE — Progress Notes (Signed)
Requested by [facility] Gweneth DimitriLisa Jacobs to attend a family meeting with patient's children to answer questions regarding hospice care as family had chosen [facility] to provide service. In attendance were 3 of patient's son's: Doyce LooseKenneth, Robert and Jacqlyn KraussSylvester [name](Raymond) and daughter in law F[place] HeightsElsie. Her daughters and one other son were unable to attend.  Attending physician Dr. Winona LegatoVaickute, unit director Scherrie BatemanEvette Law and [facility]CMRN Misty StanleyLisa were also present.  Writer provided education regarding hospice services in the home as well as in the hospice home. Questions answered. Jacqlyn KraussSylvester would like to speak with his other siblings (that were not present) and plans to make a decision by tomorrow. He will notify CMRN Misty StanleyLisa at that time. Thank you for the opportunity to be involved in the care of this patient and her family. Dayna BarkerKaren Robertson RN, BSN, [facility] [place], [facility] Liaison [phone] c

## 2015-07-25 NOTE — Care Management (Addendum)
Family meeting with 3 sons, Scherrie BatemanEvette Law, primary nurse, CM and [facility] coordinator. Presented home care options. CM to follow up with family tomorrow for their discharge decision of home with home health, home with hospice or hospice home. They have been notified that patient is medically stable for discharge and agree to make  discharge decision be tomorrow.

## 2015-07-25 NOTE — Progress Notes (Signed)
Physical Therapy Treatment Patient Details Name: Jeanella CaraSarah Berk MRN: [id number] DOB: 07-Jul-1920 Today's Date: 07/25/2015    History of Present Illness Pt is a 82 y.o. female (PMH of legally blind and dislocated R shoulder) presenting to hospital with AMS and diffuse swelling on 07/10/15 and admitted with acute combined systolic and diastolic CHF, PNA, and acute renal failure on chronic kidney disease stage III.  Pt also s/p 2 units PRBC transfusion.  Pt with recent admit with PNA and UTI.  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    PT Comments    Upon arrival, son feeding pt; notes pt has been able to eat a little. Pt a little more responsive with eyes open more and attempting to answer questions. Difficulty to discern what pt is saying. Pt seems to deny pain. Bilateral lower extremity exercises initiated and pt able to demonstrate some active assisted movement at bilateral ankles and knee; however, pt begins to demonstrate increased restless behavior moaning and moving head side to side. Son asks if pt is too tired; pt nods yes. Pt repositioned for improved comfort and no further PT attempted at this time. Family is planning on a meeting this afternoon regarding pt care moving forward. Continue PT as appropriate based on outcome from family meeting.   Follow Up Recommendations  No PT follow up;Supervision/Assistance - 24 hour     Equipment Recommendations       Recommendations for Other Services       Precautions / Restrictions Restrictions Weight Bearing Restrictions: No    Mobility  Bed Mobility                  Transfers                    Ambulation/Gait                 Stairs            Wheelchair Mobility    Modified Rankin (Stroke Patients Only)       Balance                                    Cognition Arousal/Alertness: Lethargic Behavior During Therapy: Flat affect Overall  Cognitive Status: History of cognitive impairments - at baseline                      Exercises General Exercises - Lower Extremity Ankle Circles/Pumps: AAROM;Both;5 reps;Supine (2 sets each) Quad Sets: Other (comment) (attempted, pt unable to follow instructions/perform) Short Arc Quad: AAROM;Both;5 reps;Supine    General Comments        Pertinent Vitals/Pain Pain Assessment: No/denies pain (as best as therapist can understand)    Home Living                      Prior Function            PT Goals (current goals can now be found in the care plan section) Progress towards PT goals: Not progressing toward goals - comment    Frequency  Other (Comment) (trial basis)    PT Plan Current plan remains appropriate    Co-evaluation             End of Session   Activity Tolerance: Patient limited by fatigue;Patient limited by lethargy Patient left: in bed;with family/visitor present;Other (comment) (  repositioned for comfort)     Time: 1420-1430 PT Time Calculation (min) (ACUTE ONLY): 10 min  Charges:  $Therapeutic Exercise: 8-22 mins                    G Codes:      Kristeen Miss, PTA 07/25/2015, 2:41 PM

## 2015-07-25 NOTE — Progress Notes (Signed)
Patient's temperature dropped to 97.7 at the beginning of the shift. Applied bear hugger, at 0200 temperature is 98.8 rectally.

## 2015-07-25 NOTE — Progress Notes (Signed)
Palliative Care Update  I have reviewed the latest update note from Care Manager.  It seems family would not want to talk to me again.  I have had several family meetings with the adult siblings over two different hospital stays.  Each time, they chose to continue with aggressive care.  They were never hostile toward me, but there were very strong and challenging family dynamics that made for a lack of consensus.  There were issues about who should be the decision maker and there were outspoken family members who insisted we were all 'focusing far too much on pt's age' --though I had not done so --I ended up hearing about it for 40 minutes or so from the oldest daughter.  See my note from when I signed off the last time.  I found that these dynamics made it highly unlikely for family to come to any agreement any time soon about a more palliative approach to care.  Perhaps a fresh voice and presence can bring a new perspective to this option.   I have previously mentioned hospice in the home as a way for pt to get and stay home --since they are trying to honor pt's wishes to be at home.  But they did not then want to hear anything more about 'hospice'. Especially the oldest daughter.   Legally, a majority of adult siblings should agree on the pt's care plan, as there is no HCPOA.  Two would not weigh in on decisions and one was adamantly against even 'talking' about Hospice or comfort care.  Jasmine Briggs is the oldest son, but not the only Management consultantdecision maker.  Perhaps enough time has passed for them to see that we have run out of things to do and we cannot 'fix' most of what is going on with their mother.    Family has agreed to meet with Hospice Liaison at 3pm today.  I will sign off at this time, as it is clear they did not want to meet one more time with me.   Jasmine HalterMargaret F Naeemah Jasmer, MD

## 2015-07-26 DIAGNOSIS — N189 Chronic kidney disease, unspecified: Secondary | ICD-10-CM

## 2015-07-26 DIAGNOSIS — E870 Hyperosmolality and hypernatremia: Secondary | ICD-10-CM

## 2015-07-26 DIAGNOSIS — R627 Adult failure to thrive: Secondary | ICD-10-CM

## 2015-07-26 DIAGNOSIS — L89153 Pressure ulcer of sacral region, stage 3: Secondary | ICD-10-CM

## 2015-07-26 DIAGNOSIS — R310 Gross hematuria: Secondary | ICD-10-CM

## 2015-07-26 DIAGNOSIS — N179 Acute kidney failure, unspecified: Secondary | ICD-10-CM

## 2015-07-26 DIAGNOSIS — D62 Acute posthemorrhagic anemia: Secondary | ICD-10-CM

## 2015-07-26 DIAGNOSIS — E440 Moderate protein-calorie malnutrition: Secondary | ICD-10-CM

## 2015-07-26 DIAGNOSIS — R531 Weakness: Secondary | ICD-10-CM

## 2015-07-26 DIAGNOSIS — E876 Hypokalemia: Secondary | ICD-10-CM

## 2015-07-26 DIAGNOSIS — G9341 Metabolic encephalopathy: Secondary | ICD-10-CM

## 2015-07-26 DIAGNOSIS — R1310 Dysphagia, unspecified: Secondary | ICD-10-CM

## 2015-07-26 LAB — BASIC METABOLIC PANEL
ANION GAP: 11 (ref 5–15)
BUN: 26 mg/dL — AB (ref 6–20)
CHLORIDE: 107 mmol/L (ref 101–111)
CO2: 22 mmol/L (ref 22–32)
Calcium: 9.2 mg/dL (ref 8.9–10.3)
Creatinine, Ser: 1.16 mg/dL — ABNORMAL HIGH (ref 0.44–1.00)
GFR calc Af Amer: 46 mL/min — ABNORMAL LOW (ref >60)
GFR, EST NON AFRICAN AMERICAN: 40 mL/min — AB (ref >60)
GLUCOSE: 203 mg/dL — AB (ref 65–99)
POTASSIUM: 3.4 mmol/L — AB (ref 3.5–5.1)
Sodium: 140 mmol/L (ref 135–145)

## 2015-07-26 LAB — GLUCOSE, CAPILLARY
GLUCOSE-CAPILLARY: 187 mg/dL — AB (ref 65–99)
GLUCOSE-CAPILLARY: 216 mg/dL — AB (ref 65–99)

## 2015-07-26 LAB — MAGNESIUM: MAGNESIUM: 1.8 mg/dL (ref 1.7–2.4)

## 2015-07-26 LAB — PHOSPHORUS: Phosphorus: 3.1 mg/dL (ref 2.5–4.6)

## 2015-07-26 MED ORDER — POTASSIUM CHLORIDE 10 MEQ/100ML IV SOLN
10.0000 meq | INTRAVENOUS | Status: DC
  Filled 2015-07-26 (×3): qty 100

## 2015-07-26 MED ORDER — TORSEMIDE 20 MG PO TABS: 20.0000 mg | ORAL_TABLET | Freq: Two times a day (BID) | ORAL | Status: DC

## 2015-07-26 MED ORDER — NICOTINE 7 MG/24HR TD PT24: 7.0000 mg | MEDICATED_PATCH | Freq: Every day | TRANSDERMAL | Status: DC

## 2015-07-26 MED ORDER — POTASSIUM CHLORIDE 2 MEQ/ML IV SOLN
INTRAVENOUS | Status: DC
  Filled 2015-07-26 (×2): qty 1000

## 2015-07-26 MED ORDER — ONDANSETRON HCL 4 MG PO TABS: 4.0000 mg | ORAL_TABLET | Freq: Four times a day (QID) | ORAL | Status: DC | PRN

## 2015-07-26 MED ORDER — POTASSIUM CHLORIDE 20 MEQ/15ML (10%) PO SOLN: 20.0000 meq | Freq: Every day | ORAL | Status: DC

## 2015-07-26 NOTE — Progress Notes (Addendum)
Attempted to remove PICC line but met resistance. Dr. Ether Griffins notified stated that it is important for the picc to be removed. [facility] notified to come and attempt to remove line. Foley catheter removed. Patient is incontinent and has voided since removal.

## 2015-07-26 NOTE — Care Management Important Message (Signed)
Important Message  Patient Details  Name: Jeanella CaraSarah Bangura MRN: [id number] Date of Birth: [birth year]   Medicare Important Message Given:  Yes    Marily MemosLisa M Francesa Eugenio, RN 07/26/2015, 3:18 PM

## 2015-07-26 NOTE — Care Management (Signed)
Met with family at bedside. Son states that the family has all agreed that they want to take patient home with home health. Notified Dr. Ether Griffins who will discharge patient today. Notified hospice coordinator, Flo Shanks. Jason with [facility] notified for resumption of care with SN, HHA and SW. Request a home visit within 24 hours preferably this evening. Medical Necessity completed, placed with yellow DNR and given to primary RN who will call for transport.

## 2015-07-26 NOTE — Care Management (Signed)
[facility] has declined taking patient back. Updated sons, they are are agreeable to [facility]. Referral to Dayna BarkerKaren Robertson who will let CM know if they can accept patient. Adult Protective Services referral made to [place] Sink[place]ita Copper at [facility].

## 2015-07-26 NOTE — Discharge Instructions (Signed)
Hypothermia Prevention Hypothermia is a drop in body temperature to 35F (35C) or lower. It is usually caused by exposure to cold temperatures, but it can also be caused by diseases that alter the body's temperature. WHAT ARE SOME WAYS TO PREVENT HYPOTHERMIA?  Do not underestimate the cold. Most cases of hypothermia develop at 30-15F (1-10C).  Do not ignore shivering. Persistent or violent shivering is a warning that your temperature is dropping and that you may be on the edge of getting hypothermia.  Dress appropriately for the cold:  Make sure your face, ears, and neck are covered before going into cold weather. [place] with hoods that extend several inches beyond your face are great in protecting against wind and blowing snow.  Wear a pair of cotton socks under a pair of wool socks to increase insulation and to take the sweat away from your feet.  Wear layers. If you begin to sweat, take a layer off so that your clothing does not become wet.  Stay dry. If any of your clothing gets wet, replace it right away. Your body loses heat more quickly if your clothes are wet.  Stay out of the wind.  Drink enough fluid to keep your urine clear or pale yellow.  Do not skip meals.  Do not drink alcohol if you are going to be outside in cold weather, if you are going boating, or before going to bed on cold nights. Alcohol causes your body to lose heat. WHAT ARE SOME WAYS TO PREVENT HYPOTHERMIA IN WATER? Water does not need to be extremely cold to cause hypothermia. Water at any temperature colder than your normal body temperature will cause you to lose heat. If you fall into cold water:  Get out of the water right away. If you cannot get out of the water, get as much of yourself out as possible by climbing onto anything floating nearby, such as an overturned boat.  Do not attempt to swim unless land, a boat, another person, or a life jacket is close by. Swimming uses up energy and may shorten  survival time.  Do not remove your clothing until you are safely out of the water and can get dry or warm. When in the water, buckle, button, and zip up your clothes. The layer of water between your clothing and your body will help insulate you and keep you warm.  Hold your knees to your chest. The knee-to-chest position minimizes heat loss.  If you have fallen into the water with other people, face each other, create a tight circle, and huddle together. Remember to wear a life jacket if you plan to ride in any watercraft. A life jacket can keep you alive longer in cold water by helping you float without using energy and by providing insulation to reduce heat loss. WHAT ARE SOME WAYS TO PREVENT HYPOTHERMIA IF YOU ARE STRANDED IN YOUR CAR? Keep emergency supplies such as blankets, gloves, and a change of clothes in your car. If you get stranded in your car, cover yourself with the blankets or clothing. If there are others in the car with you, huddle together. Run the car for 10 minutes each hour to warm it up. Make sure a window is slightly open and the exhaust pipe is not covered with snow while the engine is running. WHAT ARE SOME WAYS TO PREVENT HYPOTHERMIA IN CHILDREN? Children lose heat faster than adults do. Accidental hypothermia can occur even with temperatures of 60-37F (15.6-18.3C), particularly among infants.  Infants  less than one year of age should never sleep in a cold room. They should be provided with warm clothing and a wearable blanket to prevent loss of body heat.  To help prevent hypothermia when children are outside in the winter:  Dress infants and young children in one more layer than an adult would wear in the same conditions.  Limit the amount of time children spend outside in the cold.  Have children come inside often to warm up.   This information is not intended to replace advice given to you by your health care provider. Make sure you discuss any questions you  have with your health care provider.   Document Released: 03/06/2000 Document Revised: 11/28/2014 Document Reviewed: 06/04/2014 Elsevier Interactive Patient Education 2016 Elsevier Inc.  Edema Edema is an abnormal buildup of fluids. It is more common in your legs and thighs. Painless swelling of the feet and ankles is more likely as a person ages. It also is common in looser skin, like around your eyes. HOME CARE   Keep the affected body part above the level of the heart while lying down.  Do not sit still or stand for a long time.  Do not put anything right under your knees when you lie down.  Do not wear tight clothes on your upper legs.  Exercise your legs to help the puffiness (swelling) go down.  Wear elastic bandages or support stockings as told by your doctor.  A low-salt diet may help lessen the puffiness.  Only take medicine as told by your doctor. GET HELP IF:  Treatment is not working.  You have heart, liver, or kidney disease and notice that your skin looks puffy or shiny.  You have puffiness in your legs that does not get better when you raise your legs.  You have sudden weight gain for no reason. GET HELP RIGHT AWAY IF:   You have shortness of breath or chest pain.  You cannot breathe when you lie down.  You have pain, redness, or warmth in the areas that are puffy.  You have heart, liver, or kidney disease and get edema all of a sudden.  You have a fever and your symptoms get worse all of a sudden. MAKE SURE YOU:   Understand these instructions.  Will watch your condition.  Will get help right away if you are not doing well or get worse.   This information is not intended to replace advice given to you by your health care provider. Make sure you discuss any questions you have with your health care provider.   Document Released: 08/26/2007 Document Revised: 03/14/2013 Document Reviewed: 12/30/2012 Elsevier Interactive Patient Education 2016  Elsevier Inc.  Peripheral Edema You have swelling in your legs (peripheral edema). This swelling is due to excess accumulation of salt and water in your body. Edema may be a sign of heart, kidney or liver disease, or a side effect of a medication. It may also be due to problems in the leg veins. Elevating your legs and using special support stockings may be very helpful, if the cause of the swelling is due to poor venous circulation. Avoid long periods of standing, whatever the cause. Treatment of edema depends on identifying the cause. Chips, pretzels, pickles and other salty foods should be avoided. Restricting salt in your diet is almost always needed. Water pills (diuretics) are often used to remove the excess salt and water from your body via urine. These medicines prevent the kidney from reabsorbing  sodium. This increases urine flow. Diuretic treatment may also result in lowering of potassium levels in your body. Potassium supplements may be needed if you have to use diuretics daily. Daily weights can help you keep track of your progress in clearing your edema. You should call your caregiver for follow up care as recommended. SEEK IMMEDIATE MEDICAL CARE IF:   You have increased swelling, pain, redness, or heat in your legs.  You develop shortness of breath, especially when lying down.  You develop chest or abdominal pain, weakness, or fainting.  You have a fever.   This information is not intended to replace advice given to you by your health care provider. Make sure you discuss any questions you have with your health care provider.   Document Released: 04/16/2004 Document Revised: 06/01/2011 Document Reviewed: 09/19/2014 Elsevier Interactive Patient Education [employer]2016 Elsevier Inc.

## 2015-07-26 NOTE — Progress Notes (Signed)
New referral for [facility] received from [place]. Serivices of nursing, speech, Education officer, museum and home health aide requested. Writer has made both family and CMRN [name] that [name] does not offer speech therapy at this time. Writer met in the patient room with her sons Jasmine Savannah [name]) and Jasmine Briggs. [facility] information and contact number given. Writer also explained that the [facility] admissions nurse  would contact them as soon an order had been obtained from patient's PCP Dr. Marjie Skiff and that this could be the first of the week. Jasmine Savannah [name]) and Jasmine Briggs voiced understanding. Jasmine Savannah gave his cell phone number as the contact for admission [phone]. Patient lives in her home and family provides care for her. She has 4 sons and 2 daughters. Jasmine Briggs is 82 year old woman admitted to [facility] on 4/19 with a diagnosis of altered mental status and poor po intake. She has been treated this admission with IV lasix and albumin related to her anasarca as well as IV antibiotics. She is currently eating only bites, albumin 2.7. She has a stage 3 pressure ulcer to her sacrum, wound care consult this and on previous admissions. She has also required nephrology consult d/t worsening kidney function, cardio renal syndrome per nephrology note. PMH includes dementia,failure to thrive, sacral osteomyelitis, HTN, DM, recurrent pneumonia and arthritis. All patient information faxed to referral. Thank you for the opportunity to be involved in the care of this patient and her family.  Flo Shanks RN, BSN, [place] [facility] Liaison [phone] c

## 2015-07-26 NOTE — Progress Notes (Signed)
MEDICATION RELATED CONSULT NOTE  Pharmacy Consult for electrolyte management    No Known Allergies  Patient Measurements: Height: 5\' 7"  (170.2 cm) Weight: 185 lb (83.915 kg) IBW/kg (Calculated) : 61.6   Vital Signs: BP: 117/97 mmHg (05/05 1129) Pulse Rate: 74 (05/05 1129) Intake/Output from previous day: 05/04 0701 - 05/05 0700 In: 120 [P.O.:120] Out: 1850 [Urine:1850] Intake/Output from this shift:    Labs:  Recent Labs  07/25/15 0404 07/26/15 0517  CREATININE 1.26* 1.16*  MG  --  1.8  PHOS  --  3.1   Estimated Creatinine Clearance: 34.4 mL/min (by C-G formula based on Cr of 1.16).  Assessment: Pharmacy consulted for electrolyte management for 82 yo female currently receiving furosemide drip at 4mg /hr. Patient is also on D5 with 20mEq of Potassium/L at 950mL/hr.     Plan:  Potassium is 3.4. Will transition patient to D5 with 40mEq of Potassium/L at 850mL/hr.   Will obtain follow up electrolytes with am labs.   Pharmacy will continue to monitor and adjust per consult.   Audria Takeshita L 07/26/2015,1:12 PM

## 2015-07-26 NOTE — Progress Notes (Signed)
Inpatient Diabetes Program Recommendations  AACE/ADA: New Consensus Statement on Inpatient Glycemic Control (2015)  Target Ranges:  Prepandial:   less than 140 mg/dL      Peak postprandial:   less than 180 mg/dL (1-2 hours)      Critically ill patients:  140 - 180 mg/dL   Results for Jasmine Briggs, [place] (MRN [id number]) as of 07/26/2015 11:28  Ref. Range 07/25/2015 07:52 07/25/2015 11:46 07/25/2015 16:52 07/25/2015 20:59 07/26/2015 07:25  Glucose-Capillary Latest Ref Range: 65-99 mg/dL 409119 (H) 811173 (H) 914189 (H) 192 (H) 187 (H)    Diabetes history: DM2 Outpatient Diabetes medications: None Current orders for Inpatient glycemic control: None  Recommendations: Consider starting low dose basal insulin Lantus 4 units qhs. (she had previously been on Lantus 9 units and had a low blood sugar) Based on her age and renal function, I think Lantus is a safer choice than Novolog.   Susette RacerJulie Harshika Mago, RN, BA, MHA, CDE Diabetes Coordinator Inpatient Diabetes Program  [phone] (Team Pager) [phone] [facility] Office) 07/26/2015 11:36 AM

## 2015-07-26 NOTE — Care Management (Signed)
[facility] has agreed to accept patient. Family aware.

## 2015-07-26 NOTE — Progress Notes (Signed)
Patient Rectal temp 94.4 bear hugger applied will continue to monitor temp

## 2015-07-26 NOTE — Progress Notes (Signed)
Discharge instructions given to patient family, they verbalize understanding. Prescriptions faxed to [facility] family aware and agree to go and pick up for patient. Foley catheter and PICC line removed. Patient prepared for transport. All questions answered. Will continue to monitor until EMS gets here.

## 2015-07-26 NOTE — Progress Notes (Signed)
Notified by CMRN Gweneth DimitriLisa Jacobs that family has declined [facility] services and wishes to return home with home health, they were previously with [facility]. Referral notified. Thank you. Dayna BarkerKaren Robertson RN, BSN, [facility] [place], [facility] Liaison [phone] c

## 2015-07-26 NOTE — Progress Notes (Signed)
[facility]  ROUNDING NOTE   Subjective:  Patient continues to have improvement in her lower extremity edema. Creatinine stable at 1.1 as well. Family appears to be considering transition to hospice care.  Objective:  Vital signs in last 24 hours:  Temp:  [96 F (35.6 C)] 96 F (35.6 C) (05/04 1153) Pulse Rate:  [70-78] 78 (05/05 0454) Resp:  [17-18] 18 (05/05 0454) BP: (114-135)/(62-98) 114/98 mmHg (05/05 0454) SpO2:  [95 %-96 %] 95 % (05/05 0454)  Weight change:  Filed Weights   07/10/15 1757 07/10/15 2305 07/25/15 0438  Weight: 90.719 kg (200 lb) 84.188 kg (185 lb 9.6 oz) 83.915 kg (185 lb)    Intake/Output: I/O last 3 completed shifts: In: 876.1 [P.O.:120; I.V.:656.1; IV Piggyback:100] Out: 2450 [Urine:2450]   Intake/Output this shift:     Physical Exam: General: NAD, laying in bed  Head: Normocephalic, atraumatic. Moist oral mucosal membranes  Eyes: Anicteric,  Neck: Supple, trachea midline  Lungs:  Basilar rales normal effort  Heart: Irregular rhythm  Abdomen:  Soft, nontender, obese  Extremities: 1+ generalized edema.  Neurologic: Resting in bed, does converse at times  Skin: No lesions  GU: Foley with light yellow urine    Basic Metabolic Panel:  Recent Labs Lab 07/20/15 1150 07/21/15 0635 07/22/15 0529 07/23/15 0515 07/25/15 0404 07/26/15 0517  NA 151* 149* 145 142 139 140  K 3.6 3.5 3.5 3.5 3.5 3.4*  CL 121* 117* 115* 112* 107 107  CO2 21* 21* 21* 21* 21* 22  GLUCOSE 168* 148* 112* 84 96 203*  BUN 41* 37* 35* 34* 28* 26*  CREATININE 1.68* 1.56* 1.52* 1.39* 1.26* 1.16*  CALCIUM 9.1 9.5 8.9 8.8* 8.9 9.2  MG 1.9  --   --  1.9  --  1.8  PHOS 2.9  --   --   --   --  3.1    Liver Function Tests: No results for input(s): AST, ALT, ALKPHOS, BILITOT, PROT, ALBUMIN in the last 168 hours. No results for input(s): LIPASE, AMYLASE in the last 168 hours. No results for input(s): AMMONIA in the last 168 hours.  CBC:  Recent Labs Lab  07/20/15 0500 07/21/15 0635 07/22/15 0529  WBC 11.9* 10.3  --   HGB 8.3* 8.4*  --   HCT 25.9* 25.6*  --   MCV 88.3 89.0  --   PLT 148* 113* 119*    Cardiac Enzymes: No results for input(s): CKTOTAL, CKMB, CKMBINDEX, TROPONINI in the last 168 hours.  BNP: Invalid input(s): POCBNP  CBG:  Recent Labs Lab 07/25/15 0752 07/25/15 1146 07/25/15 1652 07/25/15 2059 07/26/15 0725  GLUCAP 119* 173* 189* 192* 187*    Microbiology: Results for orders placed or performed during the hospital encounter of 07/10/15  Blood culture (routine x 2)     Status: None   Collection Time: 07/10/15  6:23 PM  Result Value Ref Range Status   Specimen Description BLOOD PICC LINE  Final   Special Requests BOTTLES DRAWN AEROBIC AND ANAEROBIC  8CC  Final   Culture NO GROWTH 10 DAYS  Final   Report Status 07/20/2015 FINAL  Final  Blood culture (routine x 2)     Status: None   Collection Time: 07/10/15  7:00 PM  Result Value Ref Range Status   Specimen Description BLOOD PICC LINE  Final   Special Requests BOTTLES DRAWN AEROBIC AND ANAEROBIC  5CC  Final   Culture NO GROWTH 10 DAYS  Final   Report Status 07/20/2015  FINAL  Final  Urine culture     Status: Abnormal   Collection Time: 07/10/15  8:04 PM  Result Value Ref Range Status   Specimen Description URINE, RANDOM  Final   Special Requests NONE  Final   Culture 50,000 COLONIES/mL CANDIDA ALBICANS (A)  Final   Report Status 07/13/2015 FINAL  Final  Urine culture     Status: Abnormal   Collection Time: 07/15/15  3:58 PM  Result Value Ref Range Status   Specimen Description URINE, CATHETERIZED  Final   Special Requests NONE  Final   Culture 20,000 COLONIES/mL CANDIDA ALBICANS (A)  Final   Report Status 07/19/2015 FINAL  Final    Coagulation Studies: No results for input(s): LABPROT, INR in the last 72 hours.  Urinalysis: No results for input(s): COLORURINE, LABSPEC, PHURINE, GLUCOSEU, HGBUR, BILIRUBINUR, KETONESUR, PROTEINUR, UROBILINOGEN,  NITRITE, LEUKOCYTESUR in the last 72 hours.  Invalid input(s): APPERANCEUR    Imaging: No results found.   Medications:   . dextrose 5 % with KCl 20 mEq / L 20 mEq (07/24/15 1231)  . furosemide (LASIX) infusion 4 mg/hr (07/25/15 2048)   . sodium chloride   Intravenous Once  . albumin human  25 g Intravenous Q12H  . nicotine  7 mg Transdermal Daily  . sodium chloride flush  10-40 mL Intracatheter Q12H  . sodium chloride flush  3 mL Intravenous Q12H   ipratropium-albuterol, ondansetron **OR** ondansetron (ZOFRAN) IV, sodium chloride flush  Assessment/ Plan:  [name] is a 82 y.o. black female with dementia, hypertension, diabetes mellitus type II, gout, who was admitted to [facility] on 07/10/2015   1. Acute renal failure on chronic kidney disease stage III: due to acute exacerbation of CHF causing acute cardiorenal syndrome. Creatinine fluctuates with fluid status. Baseline most likely ~1.3 from 05/2015.  - enal function has stabilized with a creatinine of 1.1.  Okay to continue diuresis while inpatient. We would recommend transitioning to either torsemide or bumetanide as an outpatient for palliation.  2. Hypernatremia, hypokalemia: . - patient being given potassium repletion this a.m.  Sodium stable at 140.  Continue to monitor both.  3. Acute exacerbation of systolic congestive heart failure: EF of 40-45%.  With anasarca with third spacing. And hypoalbuminia.  - okay to continue furosemide drip while the patient remains an inpatient.  As above transitioned to either torsemide or bumetanide as an outpatient for palliation.   4. Chronic sacral osteomyelitis:  Previously treated with Cipro and doxycycline.  5. Gross hematuria - Hold Lovenox - urine has cleared now     LOS: 16 Jasmine Briggs 5/5/201711:29 AM

## 2015-07-26 NOTE — Progress Notes (Signed)
[facility] at bedside and were successful at removing PICC line. EMS called for transport. Patient stable at this time.

## 2015-07-26 NOTE — Discharge Summary (Signed)
[facility] [place] [facility]   PATIENT NAME: Jasmine Briggs    MR#:  [id number]  DATE OF BIRTH:  1920/07/07  DATE OF ADMISSION:  07/10/2015 ADMITTING PHYSICIAN: Oralia Manis, MD  DATE OF DISCHARGE: 07/26/2015  5:43 PM  PRIMARY CARE PHYSICIAN: Kurtis Bushman, MD     ADMISSION DIAGNOSIS:  Anasarca [R60.1] Hypothermia, initial encounter [T68.XXXA]  DISCHARGE DIAGNOSIS:  Principal Problem:   Acute CHF (congestive heart failure) (HCC) Active Problems:   Anasarca   Hypernatremia   Failure to thrive in adult   Aspiration pneumonia (HCC)   Dementia   Hypotension   Acute on chronic renal failure (HCC)   Acute posthemorrhagic anemia   Gross hematuria   Dysphagia   Moderate malnutrition (HCC)   Pressure ulcer   Type 2 diabetes mellitus (HCC)   Decubitus ulcer of sacral region, stage 3 (HCC)   Hypokalemia   Encephalopathy, metabolic   Generalized weakness   SECONDARY DIAGNOSIS:   Past Medical History  Diagnosis Date  . Dementia   . Osteomyelitis (HCC)     sacral wound  . Hypertension   . Arthritis   . Diabetes mellitus without complication (HCC)   . Gout     .pro HOSPITAL COURSE:   The patient is 82 year old female who presented with altered mental status and diffuse swelling. Patient was recently admitted at [facility] with pneumonia and UTI, was treated with antibiotics and improved , she was sent home with a PICC line and IV antibiotics. She seemed to be doing okay at home, and her son was administering her medications. However day before readmission she became more confused again and less talkative. She has dementia at baseline, but normally will interact and knows who she is and where she is. Also she had her Foley catheter removed one day before, and family noticed a lot of swelling all over her body. She was getting ertapenem and vancomycin IV at home. In the ED on evaluation she was hypothermic, altered, and diffusely edematous. Her  labs showed hypernatremia with sodium level of 158, low potassium of 2.9, creatinine of 1.76. She was admitted, initiated on lasix intravenous drip, dextrose solution, metabolic derangements resolved over period of time, but despite this she still did not eat much, and calorie count revealed minimal consumption, not enough to sustain life. We discussed with patients family these concerns, recommended hospice care, but family was conflicted and decided to return home with home health services. Discussion by problem:  Acute Combined systolic/diastolic CHF (congestive heart failure) (HCC) . The patient was managed on Lasix drip, she diuresed  about 12.2 liters over her stay in hospital,  kidney function has improved to the  baseline.. Nephrology followed patient in the hospital.   Anasarca, diuresis with Lasix drip per nephrology recommendations was continued, .she diuresed  about 12.2 liters over her stay in hospital, Urinary output was good.  Clinically improved  Sacral DU stage 3, wound care to be continued with home health services.  Acute on chronic renal failure due to ATN with a known history of chronic kidney disease stage III. Patient's creatinine improved with Lasix . Nephrologist recommended to initiate torsemide, patient is started on torsemide twice daily.  Hypernatremia. secondary free water deficit, loop diuretics. Resolved on D5 water solution.  Hypokalemia. Resolved with KCl supplementation. Magnesium was normal.  Sepsis, due to sacral osteomyelitis, Aspiration pneumonia and UTI.She has been treated with Zosyn, vancomycin , initially. repeated blood cultures were negative. urine cultures:  CANDIDA ALBICANS.Marland Kitchen Patient was seen by Dr. Sampson Goon and recommended  Cipro and doxycycline, also Diflucan and completed course. PICC line was discontinued upon discharge home  metabolic encephalopathy due to hypoglycemia, multiple metabolic derangements in the setting of Dementia. Continue  dysphagia 1 diet per speech study. Aspiration precautions. Per  dietitian ,  the patient does not take enough calories to sustain life. I discussed this with patient's  Family at family  meeting , but they decided against palliative care, but chose home health services, patient is being discharged home today.  Hypotension with history of hypertension , due to hypoalbuminemia, malnutrition, antihypertensives were discontinued.  Type 2 diabetes mellitus (HCC) with hypoglycemia due to poor oral intake, off diabetic medications.  Anemia of chronic disease and of acute blood loss due to hematuria. Status post 2 units PRBC transfusion during this admission . Was felt to be due to hematuria.  Hematuria. Resolved holding Lovenox. .  Failure to thrive, adult, malnutrition due to underlying dementia, metabolic encephalopathy, calorie count revealed unsatisfactory oral Intake, not sufficient to maintain life, discussed with patient's family , but no palliative care was chosen by family.   Generalized weakness, patient had some minimal physical therapy in the hospital, but it was  Not possible due to altered mental status/dementia    DISCHARGE CONDITIONS:   poor  CONSULTS OBTAINED:  Treatment Team:  Dalia Heading, MD Mick Sell, MD Lamont Dowdy, MD  DRUG ALLERGIES:  No Known Allergies  DISCHARGE MEDICATIONS:   Discharge Medication List as of 07/26/2015  4:13 PM    START taking these medications   Details  nicotine (NICODERM CQ - DOSED IN MG/24 HR) 7 mg/24hr patch Place 1 patch (7 mg total) onto the skin daily., Starting 07/26/2015, Until Discontinued, Normal    ondansetron (ZOFRAN) 4 MG tablet Take 1 tablet (4 mg total) by mouth every 6 (six) hours as needed for nausea., Starting 07/26/2015, Until Discontinued, Normal    potassium chloride 20 MEQ/15ML (10%) SOLN Take 15 mLs (20 mEq total) by mouth daily., Starting 07/26/2015, Until Discontinued, Normal    torsemide (DEMADEX) 20 MG  tablet Take 1 tablet (20 mg total) by mouth 2 (two) times daily., Starting 07/26/2015, Until Discontinued, Normal      CONTINUE these medications which have NOT CHANGED   Details  acetaminophen (TYLENOL) 500 MG tablet Take 500 mg by mouth every 6 (six) hours as needed for mild pain. , Until Discontinued, Historical Med    allopurinol (ZYLOPRIM) 100 MG tablet Take 100 mg by mouth daily., Until Discontinued, Historical Med    polyethylene glycol (MIRALAX / GLYCOLAX) packet Take 17 g by mouth daily as needed for mild constipation. , Until Discontinued, Historical Med      STOP taking these medications     amLODipine (NORVASC) 10 MG tablet      ertapenem (INVANZ) 1 g SOLR injection      HEPARIN SODIUM, PORCINE, IV      ibuprofen (ADVIL,MOTRIN) 600 MG tablet      QUEtiapine (SEROQUEL) 25 MG tablet      vancomycin 500 mg in sodium chloride 0.9 % 100 mL          DISCHARGE INSTRUCTIONS:    follow up with PCP  If you experience worsening of your admission symptoms, develop shortness of breath, life threatening emergency, suicidal or homicidal thoughts you must seek medical attention immediately by calling 911 or calling your MD immediately  if symptoms less severe.  You Must read complete  instructions/literature along with all the possible adverse reactions/side effects for all the Medicines you take and that have been prescribed to you. Take any new Medicines after you have completely understood and accept all the possible adverse reactions/side effects.   Please note  You were cared for by a hospitalist during your hospital stay. If you have any questions about your discharge medications or the care you received while you were in the hospital after you are discharged, you can call the unit and asked to speak with the hospitalist on call if the hospitalist that took care of you is not available. Once you are discharged, your primary care physician will handle any further medical  issues. Please note that NO REFILLS for any discharge medications will be authorized once you are discharged, as it is imperative that you return to your primary care physician (or establish a relationship with a primary care physician if you do not have one) for your aftercare needs so that they can reassess your need for medications and monitor your lab values.    Today   CHIEF COMPLAINT:   Chief Complaint  Patient presents with  . Altered Mental Status    HISTORY OF PRESENT ILLNESS:  Jeanella CaraSarah Xiong  is a 82 y.o. female who presented with altered mental status and diffuse swelling. Patient was recently admitted at [facility] with pneumonia and UTI, was treated with antibiotics and improved , she was sent home with a PICC line and IV antibiotics. She seemed to be doing okay at home, and her son was administering her medications. However day before readmission she became more confused again and less talkative. She has dementia at baseline, but normally will interact and knows who she is and where she is. Also she had her Foley catheter removed one day before, and family noticed a lot of swelling all over her body. She was getting ertapenem and vancomycin IV at home. In the ED on evaluation she was hypothermic, altered, and diffusely edematous. Her labs showed hypernatremia with sodium level of 158, low potassium of 2.9, creatinine of 1.76. She was admitted, initiated on lasix intravenous drip, dextrose solution, metabolic derangements resolved over period of time, but despite this she still did not eat much, and calorie count revealed minimal consumption, not enough to sustain life. We discussed with patients family these concerns, recommended hospice care, but family was conflicted and decided to return home with home health services. Discussion by problem:  Acute Combined systolic/diastolic CHF (congestive heart failure) (HCC) . The patient was managed on Lasix drip, she diuresed  about 12.2 liters over  her stay in hospital,  kidney function has improved to the  baseline.. Nephrology followed patient in the hospital.   Anasarca, diuresis with Lasix drip per nephrology recommendations was continued, .she diuresed  about 12.2 liters over her stay in hospital, Urinary output was good.  Clinically improved  Sacral DU stage 3, wound care to be continued with home health services.  Acute on chronic renal failure due to ATN with a known history of chronic kidney disease stage III. Patient's creatinine improved with Lasix . Nephrologist recommended to initiate torsemide, patient is started on torsemide twice daily.  Hypernatremia. secondary free water deficit, loop diuretics. Resolved on D5 water solution.  Hypokalemia. Resolved with KCl supplementation. Magnesium was normal.  Sepsis, due to sacral osteomyelitis, Aspiration pneumonia and UTI.She has been treated with Zosyn, vancomycin , initially. repeated blood cultures were negative. urine cultures: CANDIDA ALBICANS.Marland Kitchen. Patient was seen by Dr. Sampson GoonFitzgerald  and recommended  Cipro and doxycycline, also Diflucan and completed course. PICC line was discontinued upon discharge home  metabolic encephalopathy due to hypoglycemia, multiple metabolic derangements in the setting of Dementia. Continue dysphagia 1 diet per speech study. Aspiration precautions. Per  dietitian ,  the patient does not take enough calories to sustain life. I discussed this with patient's  Family at family  meeting , but they decided against palliative care, but chose home health services, patient is being discharged home today.  Hypotension with history of hypertension , due to hypoalbuminemia, malnutrition, antihypertensives were discontinued.  Type 2 diabetes mellitus (HCC) with hypoglycemia due to poor oral intake, off diabetic medications.  Anemia of chronic disease and of acute blood loss due to hematuria. Status post 2 units PRBC transfusion during this admission . Was felt  to be due to hematuria.  Hematuria. Resolved holding Lovenox. .  Failure to thrive, adult, malnutrition due to underlying dementia, metabolic encephalopathy, calorie count revealed unsatisfactory oral Intake, not sufficient to maintain life, discussed with patient's family , but no palliative care was chosen by family.   Generalized weakness, patient had some minimal physical therapy in the hospital, but it was  Not possible due to altered mental status/dementia      VITAL SIGNS:  Blood pressure 117/97, pulse 74, temperature 96.8 F (36 C), temperature source Oral, resp. rate 19, height 5\' 7"  (1.702 m), weight 83.915 kg (185 lb), SpO2 97 %.  I/O:   Intake/Output Summary (Last 24 hours) at 07/26/15 1912 Last data filed at 07/26/15 1700  Gross per 24 hour  Intake      0 ml  Output   1850 ml  Net  -1850 ml    PHYSICAL EXAMINATION:  GENERAL:  82 y.o.-year-old patient lying in the bed with no acute distress.  EYES: Pupils equal, round, reactive to light and accommodation. No scleral icterus. Extraocular muscles intact.  HEENT: Head atraumatic, normocephalic. Oropharynx and nasopharynx clear.  NECK:  Supple, no jugular venous distention. No thyroid enlargement, no tenderness.  LUNGS: Normal breath sounds bilaterally, no wheezing, rales,rhonchi or crepitation. No use of accessory muscles of respiration.  CARDIOVASCULAR: S1, S2 normal. No murmurs, rubs, or gallops.  ABDOMEN: Soft, non-tender, non-distended. Bowel sounds present. No organomegaly or mass.  EXTREMITIES: No pedal edema, cyanosis, or clubbing.  NEUROLOGIC: Cranial nerves II through XII are intact. Muscle strength 5/5 in all extremities. Sensation intact. Gait not checked.  PSYCHIATRIC: The patient is alert and oriented x 3.  SKIN: No obvious rash, lesion, or ulcer.   DATA REVIEW:   CBC  Recent Labs Lab 07/21/15 0635 07/22/15 0529  WBC 10.3  --   HGB 8.4*  --   HCT 25.6*  --   PLT 113* 119*    Chemistries    Recent Labs Lab 07/26/15 0517  NA 140  K 3.4*  CL 107  CO2 22  GLUCOSE 203*  BUN 26*  CREATININE 1.16*  CALCIUM 9.2  MG 1.8    Cardiac Enzymes No results for input(s): TROPONINI in the last 168 hours.  Microbiology Results  Results for orders placed or performed during the hospital encounter of 07/10/15  Blood culture (routine x 2)     Status: None   Collection Time: 07/10/15  6:23 PM  Result Value Ref Range Status   Specimen Description BLOOD PICC LINE  Final   Special Requests BOTTLES DRAWN AEROBIC AND ANAEROBIC  8CC  Final   Culture NO GROWTH 10 DAYS  Final  Report Status 07/20/2015 FINAL  Final  Blood culture (routine x 2)     Status: None   Collection Time: 07/10/15  7:00 PM  Result Value Ref Range Status   Specimen Description BLOOD PICC LINE  Final   Special Requests BOTTLES DRAWN AEROBIC AND ANAEROBIC  5CC  Final   Culture NO GROWTH 10 DAYS  Final   Report Status 07/20/2015 FINAL  Final  Urine culture     Status: Abnormal   Collection Time: 07/10/15  8:04 PM  Result Value Ref Range Status   Specimen Description URINE, RANDOM  Final   Special Requests NONE  Final   Culture 50,000 COLONIES/mL CANDIDA ALBICANS (A)  Final   Report Status 07/13/2015 FINAL  Final  Urine culture     Status: Abnormal   Collection Time: 07/15/15  3:58 PM  Result Value Ref Range Status   Specimen Description URINE, CATHETERIZED  Final   Special Requests NONE  Final   Culture 20,000 COLONIES/mL CANDIDA ALBICANS (A)  Final   Report Status 07/19/2015 FINAL  Final    RADIOLOGY:  No results found.  EKG:   Orders placed or performed during the hospital encounter of 07/10/15  . EKG 12-Lead  . EKG 12-Lead      Management plans discussed with the patient, family and they are in agreement.  CODE STATUS:     Code Status Orders        Start     Ordered   07/10/15 2256  Do not attempt resuscitation (DNR)   Continuous    Question Answer Comment  In the event of cardiac or  respiratory ARREST Do not call a "code blue"   In the event of cardiac or respiratory ARREST Do not perform Intubation, CPR, defibrillation or ACLS   In the event of cardiac or respiratory ARREST Use medication by any route, position, wound care, and other measures to relive pain and suffering. May use oxygen, suction and manual treatment of airway obstruction as needed for comfort.      07/10/15 2255    Code Status History    Date Active Date Inactive Code Status Order ID Comments User Context   06/24/2015  5:41 PM 06/26/2015  7:08 PM DNR [id number]  Suan Halter, MD Inpatient   06/21/2015  8:04 PM 06/24/2015  5:41 PM Full Code [id number]  Houston Siren, MD Inpatient   06/12/2015  8:35 PM 06/15/2015  7:56 PM Full Code [id number]  Enid Baas, MD Inpatient    Advance Directive Documentation        Most Recent Value   Type of Advance Directive  Healthcare Power of Attorney   Pre-existing out of facility DNR order (yellow form or pink MOST form)     "MOST" Form in Place?        TOTAL TIME TAKING CARE OF THIS PATIENT: 40 minutes.    Katharina Caper M.D on 07/26/2015 at 7:12 PM  Between 7am to 6pm - Pager - [phone]  After 6pm go to [email or web address] - password [email or web address]  [facility]  Office  [phone]  CC: Primary care physician; Kurtis Bushman, MD

## 2016-11-12 ENCOUNTER — Emergency Department: Payer: Medicare Other

## 2016-11-12 ENCOUNTER — Inpatient Hospital Stay
Admission: EM | Admit: 2016-11-12 | Discharge: 2016-11-20 | DRG: 871 | Disposition: A | Discharge status: Home Health | Payer: Medicare Other | Attending: Internal Medicine | Admitting: Internal Medicine

## 2016-11-12 ENCOUNTER — Encounter: Payer: Self-pay | Admitting: Emergency Medicine

## 2016-11-12 DIAGNOSIS — E876 Hypokalemia: Secondary | ICD-10-CM | POA: Diagnosis present

## 2016-11-12 DIAGNOSIS — Z681 Body mass index (BMI) 19 or less, adult: Secondary | ICD-10-CM

## 2016-11-12 DIAGNOSIS — F0390 Unspecified dementia without behavioral disturbance: Secondary | ICD-10-CM | POA: Diagnosis present

## 2016-11-12 DIAGNOSIS — Z79899 Other long term (current) drug therapy: Secondary | ICD-10-CM

## 2016-11-12 DIAGNOSIS — I248 Other forms of acute ischemic heart disease: Secondary | ICD-10-CM | POA: Diagnosis present

## 2016-11-12 DIAGNOSIS — B957 Other staphylococcus as the cause of diseases classified elsewhere: Secondary | ICD-10-CM | POA: Diagnosis present

## 2016-11-12 DIAGNOSIS — E1122 Type 2 diabetes mellitus with diabetic chronic kidney disease: Secondary | ICD-10-CM | POA: Diagnosis present

## 2016-11-12 DIAGNOSIS — E878 Other disorders of electrolyte and fluid balance, not elsewhere classified: Secondary | ICD-10-CM | POA: Diagnosis present

## 2016-11-12 DIAGNOSIS — J9601 Acute respiratory failure with hypoxia: Secondary | ICD-10-CM

## 2016-11-12 DIAGNOSIS — M109 Gout, unspecified: Secondary | ICD-10-CM | POA: Diagnosis present

## 2016-11-12 DIAGNOSIS — G9341 Metabolic encephalopathy: Secondary | ICD-10-CM | POA: Diagnosis present

## 2016-11-12 DIAGNOSIS — R7881 Bacteremia: Secondary | ICD-10-CM | POA: Diagnosis not present

## 2016-11-12 DIAGNOSIS — E860 Dehydration: Secondary | ICD-10-CM | POA: Diagnosis present

## 2016-11-12 DIAGNOSIS — I130 Hypertensive heart and chronic kidney disease with heart failure and stage 1 through stage 4 chronic kidney disease, or unspecified chronic kidney disease: Secondary | ICD-10-CM | POA: Diagnosis present

## 2016-11-12 DIAGNOSIS — L89313 Pressure ulcer of right buttock, stage 3: Secondary | ICD-10-CM | POA: Diagnosis present

## 2016-11-12 DIAGNOSIS — N183 Chronic kidney disease, stage 3 (moderate): Secondary | ICD-10-CM | POA: Diagnosis present

## 2016-11-12 DIAGNOSIS — G9340 Encephalopathy, unspecified: Secondary | ICD-10-CM | POA: Diagnosis not present

## 2016-11-12 DIAGNOSIS — Z515 Encounter for palliative care: Secondary | ICD-10-CM

## 2016-11-12 DIAGNOSIS — I509 Heart failure, unspecified: Secondary | ICD-10-CM | POA: Diagnosis present

## 2016-11-12 DIAGNOSIS — L89322 Pressure ulcer of left buttock, stage 2: Secondary | ICD-10-CM | POA: Diagnosis present

## 2016-11-12 DIAGNOSIS — M4628 Osteomyelitis of vertebra, sacral and sacrococcygeal region: Secondary | ICD-10-CM | POA: Diagnosis present

## 2016-11-12 DIAGNOSIS — Z66 Do not resuscitate: Secondary | ICD-10-CM | POA: Diagnosis present

## 2016-11-12 DIAGNOSIS — E43 Unspecified severe protein-calorie malnutrition: Secondary | ICD-10-CM | POA: Insufficient documentation

## 2016-11-12 DIAGNOSIS — Z7189 Other specified counseling: Secondary | ICD-10-CM | POA: Diagnosis not present

## 2016-11-12 DIAGNOSIS — D649 Anemia, unspecified: Secondary | ICD-10-CM | POA: Diagnosis present

## 2016-11-12 DIAGNOSIS — Z950 Presence of cardiac pacemaker: Secondary | ICD-10-CM

## 2016-11-12 DIAGNOSIS — Z87891 Personal history of nicotine dependence: Secondary | ICD-10-CM

## 2016-11-12 DIAGNOSIS — A419 Sepsis, unspecified organism: Principal | ICD-10-CM | POA: Diagnosis present

## 2016-11-12 DIAGNOSIS — E870 Hyperosmolality and hypernatremia: Secondary | ICD-10-CM | POA: Diagnosis present

## 2016-11-12 DIAGNOSIS — Z833 Family history of diabetes mellitus: Secondary | ICD-10-CM

## 2016-11-12 DIAGNOSIS — Z7401 Bed confinement status: Secondary | ICD-10-CM

## 2016-11-12 DIAGNOSIS — J189 Pneumonia, unspecified organism: Secondary | ICD-10-CM | POA: Diagnosis present

## 2016-11-12 DIAGNOSIS — B9562 Methicillin resistant Staphylococcus aureus infection as the cause of diseases classified elsewhere: Secondary | ICD-10-CM

## 2016-11-12 DIAGNOSIS — N179 Acute kidney failure, unspecified: Secondary | ICD-10-CM | POA: Diagnosis present

## 2016-11-12 LAB — BASIC METABOLIC PANEL
BUN: 40 mg/dL — ABNORMAL HIGH (ref 6–20)
BUN: 40 mg/dL — ABNORMAL HIGH (ref 6–20)
CALCIUM: 8.5 mg/dL — AB (ref 8.9–10.3)
CALCIUM: 8.2 mg/dL — AB (ref 8.9–10.3)
CO2: 15 mmol/L — ABNORMAL LOW (ref 22–32)
CO2: 17 mmol/L — ABNORMAL LOW (ref 22–32)
Chloride: >130 mmol/L (ref 101–111)
Chloride: >130 mmol/L (ref 101–111)
Creatinine, Ser: 1.59 mg/dL — ABNORMAL HIGH (ref 0.44–1.00)
Creatinine, Ser: 1.46 mg/dL — ABNORMAL HIGH (ref 0.44–1.00)
GFR calc Af Amer: 35 mL/min — ABNORMAL LOW (ref >60)
GFR, EST AFRICAN AMERICAN: 31 mL/min — AB (ref >60)
GFR, EST NON AFRICAN AMERICAN: 27 mL/min — AB (ref >60)
GFR, EST NON AFRICAN AMERICAN: 30 mL/min — AB (ref >60)
GLUCOSE: 316 mg/dL — AB (ref 65–99)
Glucose, Bld: 273 mg/dL — ABNORMAL HIGH (ref 65–99)
POTASSIUM: 3.4 mmol/L — AB (ref 3.5–5.1)
POTASSIUM: 3 mmol/L — AB (ref 3.5–5.1)
SODIUM: 160 mmol/L — AB (ref 135–145)
SODIUM: 158 mmol/L — AB (ref 135–145)

## 2016-11-12 LAB — COMPREHENSIVE METABOLIC PANEL
ALBUMIN: 3 g/dL — AB (ref 3.5–5.0)
ALK PHOS: 42 U/L (ref 38–126)
ALT: 11 U/L — ABNORMAL LOW (ref 14–54)
AST: 27 U/L (ref 15–41)
BILIRUBIN TOTAL: 1.3 mg/dL — AB (ref 0.3–1.2)
BUN: 38 mg/dL — ABNORMAL HIGH (ref 6–20)
CALCIUM: 9.7 mg/dL (ref 8.9–10.3)
CO2: 15 mmol/L — ABNORMAL LOW (ref 22–32)
Chloride: >130 mmol/L (ref 101–111)
Creatinine, Ser: 1.77 mg/dL — ABNORMAL HIGH (ref 0.44–1.00)
GFR, EST AFRICAN AMERICAN: 27 mL/min — AB (ref >60)
GFR, EST NON AFRICAN AMERICAN: 24 mL/min — AB (ref >60)
GLUCOSE: 197 mg/dL — AB (ref 65–99)
Potassium: 4 mmol/L (ref 3.5–5.1)
Sodium: 161 mmol/L (ref 135–145)
TOTAL PROTEIN: 7.6 g/dL (ref 6.5–8.1)

## 2016-11-12 LAB — CBC
HEMATOCRIT: 35.1 % (ref 35.0–47.0)
HEMOGLOBIN: 10.8 g/dL — AB (ref 12.0–16.0)
MCH: 28 pg (ref 26.0–34.0)
MCHC: 30.8 g/dL — AB (ref 32.0–36.0)
MCV: 90.9 fL (ref 80.0–100.0)
Platelets: 147 10*3/uL — ABNORMAL LOW (ref 150–440)
RBC: 3.86 MIL/uL (ref 3.80–5.20)
RDW: 17 % — ABNORMAL HIGH (ref 11.5–14.5)
WBC: 35.9 10*3/uL — ABNORMAL HIGH (ref 3.6–11.0)

## 2016-11-12 LAB — URINALYSIS, COMPLETE (UACMP) WITH MICROSCOPIC
Bacteria, UA: NONE SEEN
GLUCOSE, UA: NEGATIVE mg/dL
Leukocytes, UA: NEGATIVE
NITRITE: NEGATIVE
Specific Gravity, Urine: >1.03 — ABNORMAL HIGH (ref 1.005–1.030)
pH: 5 (ref 5.0–8.0)

## 2016-11-12 LAB — CBC WITH DIFFERENTIAL/PLATELET
Basophils Absolute: 0 10*3/uL (ref 0–0.1)
Basophils Relative: 0 %
EOS PCT: 0 %
Eosinophils Absolute: 0 10*3/uL (ref 0–0.7)
HEMATOCRIT: 37.8 % (ref 35.0–47.0)
Hemoglobin: 11.8 g/dL — ABNORMAL LOW (ref 12.0–16.0)
LYMPHS ABS: 0.4 10*3/uL — AB (ref 1.0–3.6)
Lymphocytes Relative: 1 %
MCH: 28.5 pg (ref 26.0–34.0)
MCHC: 31.1 g/dL — AB (ref 32.0–36.0)
MCV: 91.5 fL (ref 80.0–100.0)
Monocytes Absolute: 2.2 10*3/uL — ABNORMAL HIGH (ref 0.2–0.9)
Monocytes Relative: 6 %
NEUTROS ABS: 34 10*3/uL — AB (ref 1.4–6.5)
Neutrophils Relative %: 93 %
Platelets: 195 10*3/uL (ref 150–440)
RBC: 4.13 MIL/uL (ref 3.80–5.20)
RDW: 16 % — AB (ref 11.5–14.5)
WBC: 36.6 10*3/uL — AB (ref 3.6–11.0)
nRBC: 2 /100 WBC — ABNORMAL HIGH

## 2016-11-12 LAB — PROCALCITONIN: Procalcitonin: 3.92 ng/mL

## 2016-11-12 LAB — PROTIME-INR
INR: 1.58
Prothrombin Time: 19 seconds — ABNORMAL HIGH (ref 11.4–15.2)

## 2016-11-12 LAB — LACTIC ACID, PLASMA
Lactic Acid, Venous: 4.2 mmol/L (ref 0.5–1.9)
Lactic Acid, Venous: 1.4 mmol/L (ref 0.5–1.9)

## 2016-11-12 LAB — TROPONIN I: Troponin I: 0.34 ng/mL (ref <0.03)

## 2016-11-12 LAB — LIPASE, BLOOD: LIPASE: 25 U/L (ref 11–51)

## 2016-11-12 LAB — APTT: APTT: 28 s (ref 24–36)

## 2016-11-12 MED ORDER — ONDANSETRON HCL 4 MG PO TABS: 4.0000 mg | ORAL_TABLET | Freq: Four times a day (QID) | ORAL | Status: DC | PRN

## 2016-11-12 MED ORDER — INSULIN ASPART 100 UNIT/ML ~~LOC~~ SOLN: 0.0000 [IU] | Freq: Four times a day (QID) | SUBCUTANEOUS | Status: DC

## 2016-11-12 MED ORDER — SODIUM CHLORIDE 0.9 % IV BOLUS (SEPSIS)
1000.0000 mL | Freq: Once | INTRAVENOUS | Status: AC
  Administered 2016-11-12: 1000 mL via INTRAVENOUS

## 2016-11-12 MED ORDER — POTASSIUM CL IN DEXTROSE 5% 20 MEQ/L IV SOLN
20.0000 meq | INTRAVENOUS | Status: DC
  Administered 2016-11-12 – 2016-11-14 (×3): 20 meq via INTRAVENOUS
  Filled 2016-11-12 (×4): qty 1000

## 2016-11-12 MED ORDER — POTASSIUM CHLORIDE 10 MEQ/100ML IV SOLN
10.0000 meq | INTRAVENOUS | Status: AC
  Administered 2016-11-12 – 2016-11-13 (×6): 10 meq via INTRAVENOUS
  Filled 2016-11-12 (×6): qty 100

## 2016-11-12 MED ORDER — SODIUM CHLORIDE 0.9 % IV BOLUS (SEPSIS)
500.0000 mL | Freq: Once | INTRAVENOUS | Status: AC
  Administered 2016-11-12: 500 mL via INTRAVENOUS

## 2016-11-12 MED ORDER — ACETAMINOPHEN 650 MG RE SUPP
650.0000 mg | Freq: Once | RECTAL | Status: AC
  Administered 2016-11-12: 650 mg via RECTAL
  Filled 2016-11-12: qty 1

## 2016-11-12 MED ORDER — ONDANSETRON HCL 4 MG/2ML IJ SOLN: 4.0000 mg | Freq: Four times a day (QID) | INTRAMUSCULAR | Status: DC | PRN

## 2016-11-12 MED ORDER — PIPERACILLIN-TAZOBACTAM 3.375 G IVPB 30 MIN
INTRAVENOUS | Status: AC
  Administered 2016-11-12: 3.375 g via INTRAVENOUS
  Filled 2016-11-12: qty 50

## 2016-11-12 MED ORDER — PIPERACILLIN-TAZOBACTAM 3.375 G IVPB
3.3750 g | Freq: Two times a day (BID) | INTRAVENOUS | Status: DC
  Administered 2016-11-12 – 2016-11-15 (×6): 3.375 g via INTRAVENOUS
  Filled 2016-11-12 (×6): qty 50

## 2016-11-12 MED ORDER — DOCUSATE SODIUM 100 MG PO CAPS
100.0000 mg | ORAL_CAPSULE | Freq: Two times a day (BID) | ORAL | Status: DC
  Administered 2016-11-14 – 2016-11-20 (×4): 100 mg via ORAL
  Filled 2016-11-12 (×4): qty 1

## 2016-11-12 MED ORDER — INSULIN ASPART 100 UNIT/ML ~~LOC~~ SOLN
0.0000 [IU] | Freq: Four times a day (QID) | SUBCUTANEOUS | Status: DC
  Administered 2016-11-13: 2 [IU] via SUBCUTANEOUS
  Administered 2016-11-13 (×3): 3 [IU] via SUBCUTANEOUS
  Administered 2016-11-14 (×4): 2 [IU] via SUBCUTANEOUS
  Administered 2016-11-15: 1 [IU] via SUBCUTANEOUS
  Administered 2016-11-15: 2 [IU] via SUBCUTANEOUS
  Administered 2016-11-15 – 2016-11-19 (×8): 1 [IU] via SUBCUTANEOUS
  Filled 2016-11-12 (×18): qty 1

## 2016-11-12 MED ORDER — ORAL CARE MOUTH RINSE
15.0000 mL | Freq: Two times a day (BID) | OROMUCOSAL | Status: DC
  Administered 2016-11-12 – 2016-11-20 (×15): 15 mL via OROMUCOSAL

## 2016-11-12 MED ORDER — PIPERACILLIN-TAZOBACTAM 3.375 G IVPB 30 MIN
3.3750 g | Freq: Once | INTRAVENOUS | Status: AC
Start: 2016-11-12 — End: 2016-11-12
  Administered 2016-11-12: 3.375 g via INTRAVENOUS

## 2016-11-12 MED ORDER — DEXTROSE 5 % IV SOLN
INTRAVENOUS | Status: DC
  Administered 2016-11-12: 16:00:00 via INTRAVENOUS

## 2016-11-12 MED ORDER — HEPARIN SODIUM (PORCINE) 5000 UNIT/ML IJ SOLN
5000.0000 [IU] | Freq: Three times a day (TID) | INTRAMUSCULAR | Status: DC
  Administered 2016-11-12 – 2016-11-20 (×23): 5000 [IU] via SUBCUTANEOUS
  Filled 2016-11-12 (×23): qty 1

## 2016-11-12 MED ORDER — BISACODYL 5 MG PO TBEC
5.0000 mg | DELAYED_RELEASE_TABLET | Freq: Every day | ORAL | Status: DC | PRN
  Filled 2016-11-12: qty 1

## 2016-11-12 MED ORDER — VANCOMYCIN HCL IN DEXTROSE 1-5 GM/200ML-% IV SOLN
1000.0000 mg | Freq: Once | INTRAVENOUS | Status: AC
  Administered 2016-11-12: 1000 mg via INTRAVENOUS
  Filled 2016-11-12: qty 200

## 2016-11-12 MED ORDER — CHLORHEXIDINE GLUCONATE 0.12 % MT SOLN
15.0000 mL | Freq: Two times a day (BID) | OROMUCOSAL | Status: DC
  Administered 2016-11-12 – 2016-11-20 (×17): 15 mL via OROMUCOSAL
  Filled 2016-11-12 (×12): qty 15

## 2016-11-12 MED ORDER — ACETAMINOPHEN 325 MG PO TABS: 650.0000 mg | ORAL_TABLET | Freq: Four times a day (QID) | ORAL | Status: DC | PRN

## 2016-11-12 MED ORDER — VANCOMYCIN HCL 500 MG IV SOLR
500.0000 mg | INTRAVENOUS | Status: DC
  Administered 2016-11-13 – 2016-11-15 (×2): 500 mg via INTRAVENOUS
  Filled 2016-11-12 (×3): qty 500

## 2016-11-12 MED ORDER — ACETAMINOPHEN 650 MG RE SUPP: 650.0000 mg | Freq: Four times a day (QID) | RECTAL | Status: DC | PRN

## 2016-11-12 NOTE — Progress Notes (Signed)
Patient is disoriented x4 with minimal attempts to speak to nursing staff.  Per family she is more vocal with people she is familiar with but I have not witnessed this.  Patient does not appear to be in any distress or pain.  Paced on monitor, VSS.  IVF infusing.  Patient remains NPO pending speech eval.  Wound care consult pending.  Patient is incontinent and will need assistance turning q2hr.  Family agrees to help nursing staff with this as well.

## 2016-11-12 NOTE — ED Notes (Signed)
CODE SEPSIS CALLED TO DOUG AT [facility] 

## 2016-11-12 NOTE — H&P (Signed)
[facility] - [place] at [facility]   PATIENT NAME: Jasmine Briggs    MR#:  [id number]  DATE OF BIRTH:  04-27-1920  DATE OF ADMISSION:  11/12/2016  PRIMARY CARE PHYSICIAN: Kurtis Bushman, MD   REQUESTING/REFERRING PHYSICIAN: Dr. Alfonse Flavors  CHIEF COMPLAINT: Lethargic, worsening shortness of breath    Chief Complaint  Patient presents with  . Loss of Consciousness  . Code Sepsis    HISTORY OF PRESENT ILLNESS:  Jasmine Briggs  is a 83 y.o. female with a known history ofSevere dementia brought in by family because of worsening shortness of breath for the past 3 days associated with decreased by mouth intake for the past 2-3 days. Patient won't have severe hyponatremia with sodium 161, also was hypoxic when she came with oxygen saturation percent on room air. Patient required nonrebreather initially to keep saturation more than 90%. Right now she is on 3 minutes of oxygen saturation is around 90-20%. She is lethargic and unable to get history from her . History obtained mainly from patient's son at bedside. According to them patient is mostly bedridden and the need needs help with eating and she is the pured diet usually.  PAST MEDICAL HISTORY:   Past Medical History:  Diagnosis Date  . Arthritis   . Dementia   . Diabetes mellitus without complication (HCC)   . Gout   . Hypertension   . Osteomyelitis (HCC)    sacral wound    PAST SURGICAL HISTOIRY:   Past Surgical History:  Procedure Laterality Date  . CARDIAC SURGERY    . PACEMAKER INSERTION    . PICC line placement      SOCIAL HISTORY:   Social History  Substance Use Topics  . Smoking status: Never Smoker  . Smokeless tobacco: Former Neurosurgeon    Types: Snuff  . Alcohol use No    FAMILY HISTORY:   Family History  Problem Relation Age of Onset  . Diabetes Maternal Aunt     DRUG ALLERGIES:  No Known Allergies  REVIEW OF SYSTEMS:  Able to obtain review of systems because of  dementia, lethargy. MEDICATIONS AT HOME:   Prior to Admission medications   Medication Sig Start Date End Date Taking? Authorizing Provider  acetaminophen (TYLENOL) 500 MG tablet Take 500 mg by mouth every 6 (six) hours as needed for mild pain.    Yes [provider]  torsemide (DEMADEX) 20 MG tablet Take 1 tablet (20 mg total) by mouth 2 (two) times daily. 07/26/15  Yes Katharina Caper, MD  nicotine (NICODERM CQ - DOSED IN MG/24 HR) 7 mg/24hr patch Place 1 patch (7 mg total) onto the skin daily. Patient not taking: Reported on 11/12/2016 07/26/15   Katharina Caper, MD  ondansetron (ZOFRAN) 4 MG tablet Take 1 tablet (4 mg total) by mouth every 6 (six) hours as needed for nausea. Patient not taking: Reported on 11/12/2016 07/26/15   Katharina Caper, MD  potassium chloride 20 MEQ/15ML (10%) SOLN Take 15 mLs (20 mEq total) by mouth daily. Patient not taking: Reported on 11/12/2016 07/26/15   Katharina Caper, MD      VITAL SIGNS:  Blood pressure (!) 149/69, pulse 70, temperature (!) 104.6 F (40.3 C), temperature source Rectal, resp. rate (!) 27, height 5\' 2"  (1.575 m), weight 40.8 kg (90 lb), SpO2 97 %.  PHYSICAL EXAMINATION:  GENERAL:  83 y.o.-year-old patient lying in the bed .Unresponsive. EYES: Pupils equal, round, reactive to light HEENT: Head atraumatic, normocephalic. Oropharynx and  nasopharynx clear.  NECK:  Supple, no jugular venous distention. No thyroid enlargement, no tenderness.  LUNGS: Decreased breath sounds bilaterally, no wheezing or rales. CARDIOVASCULAR: S1, S2 normal. No murmurs, rubs, or gallops.  ABDOMEN: Soft,, nondistended. Bowel sounds present. No organomegaly or mass.  EXTREMITIES: No pedal edema, cyanosis, or clubbing.  NEUROLOGIC: Unable to obtain neurological exam because of dementia, lethargy  PSYCHIATRIC: The patient is lethargic and demented  SKIN: No obvious rash, lesion, or ulcer.   LABORATORY PANEL:   CBC  Recent Labs Lab 11/12/16 1008  WBC 36.6*  HGB  11.8*  HCT 37.8  PLT 195   ------------------------------------------------------------------------------------------------------------------  Chemistries   Recent Labs Lab 11/12/16 1008  NA 161*  K 4.0  CL >130*  CO2 15*  GLUCOSE 197*  BUN 38*  CREATININE 1.77*  CALCIUM 9.7  AST 27  ALT 11*  ALKPHOS 42  BILITOT 1.3*   ------------------------------------------------------------------------------------------------------------------  Cardiac Enzymes  Recent Labs Lab 11/12/16 1008  TROPONINI 0.34*   ------------------------------------------------------------------------------------------------------------------  RADIOLOGY:  Dg Chest Port 1 View  Result Date: 11/12/2016 CLINICAL DATA:  Hypoxia EXAM: PORTABLE CHEST 1 VIEW COMPARISON:  07/10/2015 FINDINGS: Pacing device is again seen. Cardiac enlargement is again noted. The lungs are well aerated bilaterally. No focal infiltrate or sizable effusion is seen. No bony abnormality is noted. Postsurgical changes again seen. IMPRESSION: No acute abnormality noted. Electronically Signed   By: Alcide Clever M.D.   On: 11/12/2016 11:09    EKG:   Orders placed or performed during the hospital encounter of 11/12/16  . EKG 12-Lead  . EKG 12-Lead  . EKG 12-Lead  . EKG 12-Lead    IMPRESSION AND PLAN:   Impression: 83 year old female patient with thousand was dementia found to have temperature 104 in the emergency room associated with acute respiratory failure with hypoxia, severe hyponatremia, elevated lactic acid up to 4.2 in the lab work. Admitted to telemetry for sepsis,, severe hyponatremia    #1 sepsis with evidence of fever, elevated lactic acid up to 4.2; and severe leukocytosis with WBC 36.6 admit to medical service on telemetry, continue IV vancomycin, Zosyn and aggressive fluids, follow blood cultures, prognosis is really poor due to her advanced age, dementia. Palliative care is consulted. #2 acute respiratory failure  with hypoxia likely due to pneumonia even though chest x-ray is negative for pneumonia. Continue to treat her with antibiotics, consult speech therapy, continue her nothing by mouth, continue oxygen via nasal cannula 2-3 L. Cor status is DO NOT RESUSCITATE. #Lethargy due to sepsis, worsening dementia: Patient not eating and drinking well the past for the past 2-3 days. #4 severe hyponatremia with sodium 161 of my acute on chronic renal failure with the BUN 38 and creatinine 1.77. Start D5 water at 50 cc an hour, consult nephrology, prognosis really really poor because of her severe dementia and patient is at high risk for recurrent hypernatremia due to dementia and poor by mouth intake. Consult palliative care, hospice evaluation; Elevated troponin secondary to sepsis and demand ischemia.  Condition is critical, high risk for cardiac arrest. CODE STATUS DO NOT RESUSCITATE. Discussed with patient's sons bedside. allr ecords are reviewed and case discussed with ED provider. Management plans discussed with the patient, family and they are in agreement.  CODE STATUS: DNR  TOTAL TIME TAKING CARE OF THIS PATIENT: 55 minutes.    Katha Hamming M.D on 11/12/2016 at 1:54 PM  Between 7am to 6pm - Pager - [phone]  After 6pm go to [email or web address] - password [email or web address]  [facility]  [facility]  Office  [phone]  CC: Primary care physician; Kurtis Bushman, MD  Note: This dictation was prepared with Dragon dictation along with smaller phrase technology. Any transcriptional errors that result from this process are unintentional.

## 2016-11-12 NOTE — ED Notes (Signed)
SpO2 100% on non re breather. Patient placed on 6L  at this time by this RN. Patient tolerating well. SpO2 96%.

## 2016-11-12 NOTE — Progress Notes (Signed)
Notified MD Willis of chloride >130, potassium 3.4, and sodium 158. MD to place orders. Nursing staff will continue to monitor for any changes in patient status. Lamonte Richer, RN

## 2016-11-12 NOTE — ED Triage Notes (Signed)
Patient presents to the ED via EMS with altered mental status, no responsiveness this morning.  Patient told family she didn't feel well yesterday.  Patient has a stage 2 and stage 3 pressure ulcer to her sacrum and appears impacted.  Her skin is hot and dry.  Patient is tachypnic with low oxygen saturation. Patient moans occasionally.

## 2016-11-12 NOTE — Progress Notes (Signed)
ANTIBIOTIC CONSULT NOTE - INITIAL  Pharmacy Consult for Vancomycin and Zosyn Indication: sepsis  No Known Allergies  Patient Measurements: Height: 5\' 2"  (157.5 cm) Weight: 90 lb (40.8 kg) IBW/kg (Calculated) : 50.1 Adjusted Body Weight:   Vital Signs: Temp: 98.4 F (36.9 C) (08/23 1500) Temp Source: Oral (08/23 1500) BP: 165/75 (08/23 1500) Pulse Rate: 80 (08/23 1500) Intake/Output from previous day: No intake/output data recorded. Intake/Output from this shift: Total I/O In: 2762.5 [I.V.:12.5; IV Piggyback:2750] Out: -   Labs:  Recent Labs  11/12/16 1008 11/12/16 1536  WBC 36.6* 35.9*  HGB 11.8* 10.8*  PLT 195 147*  CREATININE 1.77* 1.59*   Estimated Creatinine Clearance: 14.2 mL/min (A) (by C-G formula based on SCr of 1.59 mg/dL (H)). No results for input(s): VANCOTROUGH, VANCOPEAK, VANCORANDOM, GENTTROUGH, GENTPEAK, GENTRANDOM, TOBRATROUGH, TOBRAPEAK, TOBRARND, AMIKACINPEAK, AMIKACINTROU, AMIKACIN in the last 72 hours.   Microbiology: No results found for this or any previous visit (from the past 720 hour(s)).  Medical History: Past Medical History:  Diagnosis Date  . Arthritis   . Dementia   . Diabetes mellitus without complication (HCC)   . Gout   . Hypertension   . Osteomyelitis (HCC)    sacral wound    Medications:  Prescriptions Prior to Admission  Medication Sig Dispense Refill Last Dose  . acetaminophen (TYLENOL) 500 MG tablet Take 500 mg by mouth every 6 (six) hours as needed for mild pain.    PRN at PRN  . torsemide (DEMADEX) 20 MG tablet Take 1 tablet (20 mg total) by mouth 2 (two) times daily. 60 tablet 5 11/11/2016 at 2000  . nicotine (NICODERM CQ - DOSED IN MG/24 HR) 7 mg/24hr patch Place 1 patch (7 mg total) onto the skin daily. (Patient not taking: Reported on 11/12/2016) 28 patch 0 Not Taking at Unknown time  . ondansetron (ZOFRAN) 4 MG tablet Take 1 tablet (4 mg total) by mouth every 6 (six) hours as needed for nausea. (Patient not  taking: Reported on 11/12/2016) 20 tablet 0 Not Taking at Unknown time  . potassium chloride 20 MEQ/15ML (10%) SOLN Take 15 mLs (20 mEq total) by mouth daily. (Patient not taking: Reported on 11/12/2016) 450 mL 0 Not Taking at Unknown time   Scheduled:  . chlorhexidine  15 mL Mouth Rinse BID  . docusate sodium  100 mg Oral BID  . heparin  5,000 Units Subcutaneous Q8H  . mouth rinse  15 mL Mouth Rinse q12n4p   Assessment: Pharmacy consulted to dose and monitor Vancomycin and Zosyn in this critically ill elderly woman. Patient lactic acid up to 4.2 with severe leukocytosis   Goal of Therapy:  Vancomycin Trough level 15-20   Plan:  Patient received Vancomycin 1 g IV @ ~1100 on 8/23 and Zosyn 3.375 g IV x 1 in ED. Will start Vancomycin 500 mg IV q36 hours 36 hours from the initial dose of Vancomycin. Will also start Zosyn 3.375 g IV q36 hours.    Ella Golomb D 11/12/2016,4:54 PM

## 2016-11-12 NOTE — ED Provider Notes (Signed)
[facility] Emergency Department Provider Note  ____________________________________________  Time seen: Approximately 10:14 AM  I have reviewed the triage vital signs and the nursing notes.   HISTORY  Chief Complaint Loss of Consciousness and Code Sepsis  Level 5 Caveat: Portions of the History and Physical were unable to be obtained due to altered mental status.   HPI Lilliemae Fruge is a 83 y.o. female sent to the ED with altered mental status. Patient has not been feeling well yesterday, symptoms described as malaise and generalized weakness. Today the patient was found unresponsive. His breathing, EMS found the patient to be hypoxic with a oxygen saturation of about 80%. Requiring nonrebreather oxygen to achieve a saturation of 90% prior to arrival in the ED. Patient not able to provide any history.  CODE STATUS is DO NOT RESUSCITATE, documentation accompanies the patient.     Past Medical History:  Diagnosis Date  . Arthritis   . Dementia   . Diabetes mellitus without complication (HCC)   . Gout   . Hypertension   . Osteomyelitis (HCC)    sacral wound     Patient Active Problem List   Diagnosis Date Noted  . Decubitus ulcer of sacral region, stage 3 (HCC) 07/26/2015  . Acute on chronic renal failure (HCC) 07/26/2015  . Hypernatremia 07/26/2015  . Hypokalemia 07/26/2015  . Encephalopathy, metabolic 07/26/2015  . Acute posthemorrhagic anemia 07/26/2015  . Gross hematuria 07/26/2015  . Generalized weakness 07/26/2015  . Dysphagia 07/26/2015  . Moderate malnutrition (HCC) 07/26/2015  . Failure to thrive in adult 07/26/2015  . Dementia 07/10/2015  . Hypotension 07/10/2015  . Acute CHF (congestive heart failure) (HCC) 07/10/2015  . Anasarca 07/10/2015  . Type 2 diabetes mellitus (HCC) 07/10/2015  . Aspiration pneumonia (HCC) 06/21/2015  . Pressure ulcer 06/14/2015  . Altered mental status 06/12/2015     Past Surgical History:  Procedure  Laterality Date  . CARDIAC SURGERY    . PACEMAKER INSERTION    . PICC line placement       Prior to Admission medications   Medication Sig Start Date End Date Taking? Authorizing Provider  acetaminophen (TYLENOL) 500 MG tablet Take 500 mg by mouth every 6 (six) hours as needed for mild pain.     [provider]  allopurinol (ZYLOPRIM) 100 MG tablet Take 100 mg by mouth daily.    [provider]  nicotine (NICODERM CQ - DOSED IN MG/24 HR) 7 mg/24hr patch Place 1 patch (7 mg total) onto the skin daily. 07/26/15   Katharina Caper, MD  ondansetron (ZOFRAN) 4 MG tablet Take 1 tablet (4 mg total) by mouth every 6 (six) hours as needed for nausea. 07/26/15   Katharina Caper, MD  polyethylene glycol (MIRALAX / GLYCOLAX) packet Take 17 g by mouth daily as needed for mild constipation.     [provider]  potassium chloride 20 MEQ/15ML (10%) SOLN Take 15 mLs (20 mEq total) by mouth daily. 07/26/15   Katharina Caper, MD  torsemide (DEMADEX) 20 MG tablet Take 1 tablet (20 mg total) by mouth 2 (two) times daily. 07/26/15   Katharina Caper, MD     Allergies Patient has no known allergies.   Family History  Problem Relation Age of Onset  . Diabetes Maternal Aunt     Social History Social History  Substance Use Topics  . Smoking status: Never Smoker  . Smokeless tobacco: Former Neurosurgeon    Types: Snuff  . Alcohol use No  Review of Systems Unable to obtain due to altered mental status ____________________________________________   PHYSICAL EXAM:  VITAL SIGNS: ED Triage Vitals  Enc Vitals Group     BP 11/12/16 1005 137/64     Pulse Rate 11/12/16 1005 78     Resp 11/12/16 1005 (!) 37     Temp 11/12/16 1005 (!) 104.6 F (40.3 C)     Temp Source 11/12/16 1005 Rectal     SpO2 11/12/16 1002 90 %     Weight 11/12/16 1005 90 lb (40.8 kg)     Height 11/12/16 1005 5\' 2"  (1.575 m)     Head Circumference --      Peak Flow --      Pain Score --      Pain Loc --      Pain  Edu? --      Excl. in GC? --     Vital signs reviewed, nursing assessments reviewed.  Exam limited by unresponsiveness and lack of any pain response to stimulus.  Constitutional:   Comatose. Respiratory distress, ill-appearing Eyes:   No scleral icterus.  Disconjugate gaze. Pupils equal round reactive to light, 2-3 mm bilaterally. Concurrent reflex present. ENT   Head:   Normocephalic and atraumatic.   Nose:   No congestion/rhinnorhea. No epistaxis   Mouth/Throat:   Dry mucous membranes, .    Neck:  No mass Hematological/Lymphatic/Immunilogical:   No cervical lymphadenopathy. Cardiovascular:   RRR. Symmetric bilateral radial and DP pulses.  No murmurs.  Respiratory:   Tachypnea, diffuse rhonchi right lung. Increased work of breathing. Gastrointestinal:   Soft  Non distended. No frank rigidity  Musculoskeletal:   Normal range of motion in all extremities. No joint effusions.    No edema. Neurologic:   Unresponsive No response to painful stimulus in any extremities GCS 3.  Skin:    Skin is very warm to the touch, dry and intact except for sacral decubitus ulcers as previously noted, stage II and stage III.Marland Kitchen No rash noted.  No petechiae, purpura, or bullae.  ____________________________________________    LABS (pertinent positives/negatives) (all labs ordered are listed, but only abnormal results are displayed) Labs Reviewed  CULTURE, BLOOD (ROUTINE X 2)  CULTURE, BLOOD (ROUTINE X 2)  URINE CULTURE  LACTIC ACID, PLASMA  LACTIC ACID, PLASMA  COMPREHENSIVE METABOLIC PANEL  LIPASE, BLOOD  TROPONIN I  CBC WITH DIFFERENTIAL/PLATELET  PROCALCITONIN  APTT  PROTIME-INR  URINALYSIS, COMPLETE (UACMP) WITH MICROSCOPIC   ____________________________________________   EKG  Interpreted by me Ventricular paced rhythm, left bundle branch block, rate of 71. No acute ischemic changes.  ____________________________________________    RADIOLOGY  No results  found.  ____________________________________________   PROCEDURES Procedures CRITICAL CARE Performed by: Scotty Court, Konor Noren   Total critical care time: 35 minutes  Critical care time was exclusive of separately billable procedures and treating other patients.  Critical care was necessary to treat or prevent imminent or life-threatening deterioration.  Critical care was time spent personally by me on the following activities: development of treatment plan with patient and/or surrogate as well as nursing, discussions with consultants, evaluation of patient's response to treatment, examination of patient, obtaining history from patient or surrogate, ordering and performing treatments and interventions, ordering and review of laboratory studies, ordering and review of radiographic studies, pulse oximetry and re-evaluation of patient's condition.  ____________________________________________   INITIAL IMPRESSION / ASSESSMENT AND PLAN / ED COURSE  Pertinent labs & imaging results that were available during my care of the patient were  reviewed by me and considered in my medical decision making (see chart for details).  Patient presents comatose, appears to be septic from pneumonia. Code sepsis protocol initiated, high volume saline boluses for resuscitation, empiric antibiotics with vancomycin and Zosyn, check labs and chest x-ray, plan for admission. Patient is DO NOT RESUSCITATE, and has altered mental status. Not a candidate for BiPAP, intubation inappropriate. May try high flow nasal cannula if oxygenation worsens. Patient has a poor prognosis at this point.   ----------------------------------------- 12:05 PM on 11/12/2016 -----------------------------------------  On reassessment at 11:00 AM, oxygen saturation is 100% on nonrebreather. Patient is opening her eyes in response to her son's voice and able to squeeze my hand. Case discussed with the patient's 2 sons at bedside, they agree  with admission and further antibiotics and medical management. They understand that she is critically ill and that options will be limited given her DO NOT RESUSCITATE status. Patient is hypernatremic, lactate of 4.2, both of which should improve with aggressive IV resuscitation.     ____________________________________________   FINAL CLINICAL IMPRESSION(S) / ED DIAGNOSES  Final diagnoses:  Sepsis, due to unspecified organism (HCC)  Acute respiratory failure with hypoxia (HCC)  Encephalopathy      New Prescriptions   No medications on file     Portions of this note were generated with dragon dictation software. Dictation errors may occur despite best attempts at proofreading.    Sharman Cheek, MD 11/12/16 [phone]

## 2016-11-13 DIAGNOSIS — E43 Unspecified severe protein-calorie malnutrition: Secondary | ICD-10-CM | POA: Insufficient documentation

## 2016-11-13 DIAGNOSIS — J9601 Acute respiratory failure with hypoxia: Secondary | ICD-10-CM

## 2016-11-13 DIAGNOSIS — R7881 Bacteremia: Secondary | ICD-10-CM

## 2016-11-13 DIAGNOSIS — Z515 Encounter for palliative care: Secondary | ICD-10-CM

## 2016-11-13 LAB — BASIC METABOLIC PANEL
BUN: 40 mg/dL — AB (ref 6–20)
BUN: 39 mg/dL — AB (ref 6–20)
BUN: 38 mg/dL — AB (ref 6–20)
BUN: 36 mg/dL — AB (ref 6–20)
CO2: 20 mmol/L — ABNORMAL LOW (ref 22–32)
CO2: 17 mmol/L — ABNORMAL LOW (ref 22–32)
CO2: 17 mmol/L — ABNORMAL LOW (ref 22–32)
CO2: 14 mmol/L — ABNORMAL LOW (ref 22–32)
CREATININE: 1.26 mg/dL — AB (ref 0.44–1.00)
CREATININE: 1.22 mg/dL — AB (ref 0.44–1.00)
Calcium: 8.3 mg/dL — ABNORMAL LOW (ref 8.9–10.3)
Calcium: 8.2 mg/dL — ABNORMAL LOW (ref 8.9–10.3)
Calcium: 8.7 mg/dL — ABNORMAL LOW (ref 8.9–10.3)
Calcium: 8.6 mg/dL — ABNORMAL LOW (ref 8.9–10.3)
Chloride: >130 mmol/L (ref 101–111)
Chloride: >130 mmol/L (ref 101–111)
Creatinine, Ser: 1.33 mg/dL — ABNORMAL HIGH (ref 0.44–1.00)
Creatinine, Ser: 1.53 mg/dL — ABNORMAL HIGH (ref 0.44–1.00)
GFR calc Af Amer: 39 mL/min — ABNORMAL LOW (ref >60)
GFR calc Af Amer: 33 mL/min — ABNORMAL LOW (ref >60)
GFR calc Af Amer: 41 mL/min — ABNORMAL LOW (ref >60)
GFR calc Af Amer: 43 mL/min — ABNORMAL LOW (ref >60)
GFR calc non Af Amer: 28 mL/min — ABNORMAL LOW (ref >60)
GFR, EST NON AFRICAN AMERICAN: 33 mL/min — AB (ref >60)
GFR, EST NON AFRICAN AMERICAN: 36 mL/min — AB (ref >60)
GFR, EST NON AFRICAN AMERICAN: 37 mL/min — AB (ref >60)
GLUCOSE: 252 mg/dL — AB (ref 65–99)
GLUCOSE: 199 mg/dL — AB (ref 65–99)
GLUCOSE: 220 mg/dL — AB (ref 65–99)
GLUCOSE: 202 mg/dL — AB (ref 65–99)
POTASSIUM: 3.9 mmol/L (ref 3.5–5.1)
POTASSIUM: 4.2 mmol/L (ref 3.5–5.1)
Potassium: 4.3 mmol/L (ref 3.5–5.1)
Potassium: 4.1 mmol/L (ref 3.5–5.1)
SODIUM: 155 mmol/L — AB (ref 135–145)
SODIUM: 152 mmol/L — AB (ref 135–145)
Sodium: 159 mmol/L — ABNORMAL HIGH (ref 135–145)
Sodium: 154 mmol/L — ABNORMAL HIGH (ref 135–145)

## 2016-11-13 LAB — GLUCOSE, CAPILLARY
GLUCOSE-CAPILLARY: 174 mg/dL — AB (ref 65–99)
GLUCOSE-CAPILLARY: 210 mg/dL — AB (ref 65–99)
GLUCOSE-CAPILLARY: 220 mg/dL — AB (ref 65–99)
GLUCOSE-CAPILLARY: 234 mg/dL — AB (ref 65–99)

## 2016-11-13 LAB — CBC
HEMATOCRIT: 31.6 % — AB (ref 35.0–47.0)
Hemoglobin: 9.9 g/dL — ABNORMAL LOW (ref 12.0–16.0)
MCH: 28.3 pg (ref 26.0–34.0)
MCHC: 31.2 g/dL — AB (ref 32.0–36.0)
MCV: 90.7 fL (ref 80.0–100.0)
Platelets: 131 10*3/uL — ABNORMAL LOW (ref 150–440)
RBC: 3.49 MIL/uL — ABNORMAL LOW (ref 3.80–5.20)
RDW: 16.4 % — AB (ref 11.5–14.5)
WBC: 39.6 10*3/uL — ABNORMAL HIGH (ref 3.6–11.0)

## 2016-11-13 LAB — BLOOD CULTURE ID PANEL (REFLEXED)
ACINETOBACTER BAUMANNII: NOT DETECTED
CANDIDA GLABRATA: NOT DETECTED
CANDIDA KRUSEI: NOT DETECTED
CANDIDA PARAPSILOSIS: NOT DETECTED
Candida albicans: NOT DETECTED
Candida tropicalis: NOT DETECTED
ENTEROCOCCUS SPECIES: NOT DETECTED
ESCHERICHIA COLI: NOT DETECTED
Enterobacter cloacae complex: NOT DETECTED
Enterobacteriaceae species: NOT DETECTED
Haemophilus influenzae: NOT DETECTED
KLEBSIELLA OXYTOCA: NOT DETECTED
KLEBSIELLA PNEUMONIAE: NOT DETECTED
Listeria monocytogenes: NOT DETECTED
Methicillin resistance: DETECTED — AB
Neisseria meningitidis: NOT DETECTED
PROTEUS SPECIES: NOT DETECTED
Pseudomonas aeruginosa: NOT DETECTED
SERRATIA MARCESCENS: NOT DETECTED
STAPHYLOCOCCUS AUREUS BCID: NOT DETECTED
STAPHYLOCOCCUS SPECIES: DETECTED — AB
Streptococcus agalactiae: NOT DETECTED
Streptococcus pneumoniae: NOT DETECTED
Streptococcus pyogenes: NOT DETECTED
Streptococcus species: NOT DETECTED

## 2016-11-13 LAB — URINE CULTURE: CULTURE: NO GROWTH

## 2016-11-13 MED ORDER — IPRATROPIUM-ALBUTEROL 0.5-2.5 (3) MG/3ML IN SOLN
3.0000 mL | Freq: Four times a day (QID) | RESPIRATORY_TRACT | Status: DC
  Administered 2016-11-13 – 2016-11-20 (×26): 3 mL via RESPIRATORY_TRACT
  Filled 2016-11-13 (×27): qty 3

## 2016-11-13 NOTE — Consult Note (Signed)
[facility] Infectious Disease     Reason for Consult: sepsis, bacteremia,    Referring Physician: Karlton Lemon Date of Admission:  11/12/2016   Active Problems:   Sepsis ([place])   Acute respiratory failure with hypoxia ([place])   MRSA bacteremia   Palliative care encounter   Protein-calorie malnutrition, severe   HPI: Jasmine Briggs is a 83 y.o. female admitted with increasing SOB and decreased po intake, and AMS. On admit temp 104 and wbc 36.  CXR neg. UA 6-30 wbc. Na 161 and Cr up to 1.77.  She was started on vanco and zosyn. BCX 2/2 + Staph species.    Past Medical History:  Diagnosis Date  . Arthritis   . Dementia   . Diabetes mellitus without complication ([place])   . Gout   . Hypertension   . Osteomyelitis ([place])    sacral wound   Past Surgical History:  Procedure Laterality Date  . CARDIAC SURGERY    . PACEMAKER INSERTION    . PICC line placement     Social History  Substance Use Topics  . Smoking status: Never Smoker  . Smokeless tobacco: Former Systems developer    Types: Snuff  . Alcohol use No   Family History  Problem Relation Age of Onset  . Diabetes Maternal Aunt     Allergies: No Known Allergies  Current antibiotics: Antibiotics Given (last 72 hours)    Date/Time Action Medication Dose Rate   11/12/16 1035 New Bag/Given   piperacillin-tazobactam (ZOSYN) IVPB 3.375 g 3.375 g 100 mL/hr   11/12/16 1134 New Bag/Given   vancomycin (VANCOCIN) IVPB 1000 mg/200 mL premix 1,000 mg 200 mL/hr   11/12/16 2306 New Bag/Given   piperacillin-tazobactam (ZOSYN) IVPB 3.375 g 3.375 g 12.5 mL/hr   11/13/16 1124 New Bag/Given   piperacillin-tazobactam (ZOSYN) IVPB 3.375 g 3.375 g 12.5 mL/hr      MEDICATIONS: . chlorhexidine  15 mL Mouth Rinse BID  . docusate sodium  100 mg Oral BID  . heparin  5,000 Units Subcutaneous Q8H  . insulin aspart  0-9 Units Subcutaneous Q6H  . ipratropium-albuterol  3 mL Nebulization Q6H  . mouth rinse  15 mL Mouth Rinse q12n4p    Review of Systems -  unable to obtain  OBJECTIVE: Temp:  [97.7 F (36.5 C)-98 F (36.7 C)] 98 F (36.7 C) (08/24 1127) Pulse Rate:  [70] 70 (08/24 1127) Resp:  [16-18] 18 (08/24 1127) BP: (118-155)/(60-78) 155/78 (08/24 1127) SpO2:  [90 %-99 %] 90 % (08/24 1127) Weight:  [40.7 kg (89 lb 12.8 oz)] 40.7 kg (89 lb 12.8 oz) (08/24 0500) Physical Exam  Constitutional:  Frail, lethargic HENT: [place]/AT, PERRLA, no scleral icterus Mouth/Throat: Oropharynx is clear and moist. No oropharyngeal exudate.  Cardiovascular: Normal rate, regular rhythm and normal heart sounds. Pulmonary/Chest: Effort normal and breath sounds normal. Neck = supple, no nuchal rigidity Abdominal: Soft. Bowel sounds are normal.  exhibits no distension. There is no tenderness.  Lymphadenopathy: no cervical adenopathy. No axillary adenopathy Neurological: alert and oriented to person, place, and time.  Skin: Sacrum, left aspect 3 cm x 1.4 cm x 0.3 cm Right aspect 1 cm x 0.5 cm x 0.1 cm scarring present circumferentially where areas have healed. Bony prominence evident.  Wound XIP:JASN and moist Psychiatric: unable to assess  LABS: Results for orders placed or performed during the hospital encounter of 11/12/16 (from the past 48 hour(s))  Lactic acid, plasma     Status: Abnormal   Collection Time: 11/12/16 10:08 AM  Result Value Ref Range   Lactic Acid, Venous 4.2 (HH) 0.5 - 1.9 mmol/L    Comment: CRITICAL RESULT CALLED TO, READ BACK BY AND VERIFIED WITH  ANNA HOLT AT 1058 11/12/16 SDR   Comprehensive metabolic panel     Status: Abnormal   Collection Time: 11/12/16 10:08 AM  Result Value Ref Range   Sodium 161 (HH) 135 - 145 mmol/L    Comment: CRITICAL RESULT CALLED TO, READ BACK BY AND VERIFIED WITH:  ANNA HOLT AT 1058 11/12/16 SDR    Potassium 4.0 3.5 - 5.1 mmol/L   Chloride >130 (HH) 101 - 111 mmol/L    Comment: CRITICAL RESULT CALLED TO, READ BACK BY AND VERIFIED WITH:  ANNA HOLT AT 1058 11/12/16 SDR    CO2 15 (L) 22 - 32 mmol/L    Glucose, Bld 197 (H) 65 - 99 mg/dL   BUN 38 (H) 6 - 20 mg/dL   Creatinine, Ser 1.77 (H) 0.44 - 1.00 mg/dL   Calcium 9.7 8.9 - 10.3 mg/dL   Total Protein 7.6 6.5 - 8.1 g/dL   Albumin 3.0 (L) 3.5 - 5.0 g/dL   AST 27 15 - 41 U/L   ALT 11 (L) 14 - 54 U/L   Alkaline Phosphatase 42 38 - 126 U/L   Total Bilirubin 1.3 (H) 0.3 - 1.2 mg/dL   GFR calc non Af Amer 24 (L) >60 mL/min   GFR calc Af Amer 27 (L) >60 mL/min    Comment: (NOTE) The eGFR has been calculated using the CKD EPI equation. This calculation has not been validated in all clinical situations. eGFR's persistently <60 mL/min signify possible Chronic Kidney Disease.    Anion gap NOT CALCULATED 5 - 15  Lipase, blood     Status: None   Collection Time: 11/12/16 10:08 AM  Result Value Ref Range   Lipase 25 11 - 51 U/L  Troponin I     Status: Abnormal   Collection Time: 11/12/16 10:08 AM  Result Value Ref Range   Troponin I 0.34 (HH) <0.03 ng/mL    Comment: CRITICAL RESULT CALLED TO, READ BACK BY AND VERIFIED WITH  ANNA HOLT AT 1058 11/12/16 SDR   CBC WITH DIFFERENTIAL     Status: Abnormal   Collection Time: 11/12/16 10:08 AM  Result Value Ref Range   WBC 36.6 (H) 3.6 - 11.0 K/uL   RBC 4.13 3.80 - 5.20 MIL/uL   Hemoglobin 11.8 (L) 12.0 - 16.0 g/dL   HCT 37.8 35.0 - 47.0 %   MCV 91.5 80.0 - 100.0 fL   MCH 28.5 26.0 - 34.0 pg   MCHC 31.1 (L) 32.0 - 36.0 g/dL   RDW 16.0 (H) 11.5 - 14.5 %   Platelets 195 150 - 440 K/uL   Neutrophils Relative % 93 %   Lymphocytes Relative 1 %   Monocytes Relative 6 %   Eosinophils Relative 0 %   Basophils Relative 0 %   nRBC 2 (H) 0 /100 WBC   Neutro Abs 34.0 (H) 1.4 - 6.5 K/uL   Lymphs Abs 0.4 (L) 1.0 - 3.6 K/uL   Monocytes Absolute 2.2 (H) 0.2 - 0.9 K/uL   Eosinophils Absolute 0.0 0 - 0.7 K/uL   Basophils Absolute 0.0 0 - 0.1 K/uL   RBC Morphology MIXED RBC POPULATION     Comment: BURR CELLS POLYCHROMASIA PRESENT   Procalcitonin     Status: None   Collection Time: 11/12/16 10:08 AM   Result Value Ref Range  Procalcitonin 3.92 ng/mL    Comment:        Interpretation: PCT > 2 ng/mL: Systemic infection (sepsis) is likely, unless other causes are known. (NOTE)         ICU PCT Algorithm               Non ICU PCT Algorithm    ----------------------------     ------------------------------         PCT < 0.25 ng/mL                 PCT < 0.1 ng/mL     Stopping of antibiotics            Stopping of antibiotics       strongly encouraged.               strongly encouraged.    ----------------------------     ------------------------------       PCT level decrease by               PCT < 0.25 ng/mL       >= 80% from peak PCT       OR PCT 0.25 - 0.5 ng/mL          Stopping of antibiotics                                             encouraged.     Stopping of antibiotics           encouraged.    ----------------------------     ------------------------------       PCT level decrease by              PCT >= 0.25 ng/mL       < 80% from peak PCT        AND PCT >= 0.5 ng/mL            Continuing antibiotics                                               encouraged.       Continuing antibiotics            encouraged.    ----------------------------     ------------------------------     PCT level increase compared          PCT > 0.5 ng/mL         with peak PCT AND          PCT >= 0.5 ng/mL             Escalation of antibiotics                                          strongly encouraged.      Escalation of antibiotics        strongly encouraged.   APTT     Status: None   Collection Time: 11/12/16 10:08 AM  Result Value Ref Range   aPTT 28 24 - 36 seconds  Protime-INR     Status: Abnormal   Collection Time: 11/12/16 10:08 AM  Result Value Ref Range  Prothrombin Time 19.0 (H) 11.4 - 15.2 seconds   INR 1.58   Blood Culture (routine x 2)     Status: None (Preliminary result)   Collection Time: 11/12/16 10:08 AM  Result Value Ref Range   Specimen Description BLOOD RIGHT  HAND    Special Requests      BOTTLES DRAWN AEROBIC AND ANAEROBIC Blood Culture adequate volume   Culture  Setup Time      GRAM POSITIVE COCCI AEROBIC BOTTLE ONLY CRITICAL VALUE NOTED.  VALUE IS CONSISTENT WITH PREVIOUSLY REPORTED AND CALLED VALUE.    Culture GRAM POSITIVE COCCI    Report Status PENDING   Blood Culture (routine x 2)     Status: None (Preliminary result)   Collection Time: 11/12/16 10:08 AM  Result Value Ref Range   Specimen Description BLOOD LEFT HAND    Special Requests      BOTTLES DRAWN AEROBIC AND ANAEROBIC Blood Culture adequate volume   Culture  Setup Time      GRAM POSITIVE COCCI IN BOTH AEROBIC AND ANAEROBIC BOTTLES CRITICAL RESULT CALLED TO, READ BACK BY AND VERIFIED WITH: MATT MCBANE @ 1950 ON 11/13/2016 BY CAF    Culture GRAM POSITIVE COCCI    Report Status PENDING   Urinalysis, Complete w Microscopic     Status: Abnormal   Collection Time: 11/12/16 10:08 AM  Result Value Ref Range   Color, Urine YELLOW YELLOW   APPearance CLOUDY (A) CLEAR   Specific Gravity, Urine >1.030 (H) 1.005 - 1.030   pH 5.0 5.0 - 8.0   Glucose, UA NEGATIVE NEGATIVE mg/dL   Hgb urine dipstick MODERATE (A) NEGATIVE   Bilirubin Urine MODERATE (A) NEGATIVE   Ketones, ur TRACE (A) NEGATIVE mg/dL   Protein, ur >300 (A) NEGATIVE mg/dL   Nitrite NEGATIVE NEGATIVE   Leukocytes, UA NEGATIVE NEGATIVE   Squamous Epithelial / LPF 6-30 (A) NONE SEEN   WBC, UA 6-30 0 - 5 WBC/hpf   RBC / HPF 6-30 0 - 5 RBC/hpf   Bacteria, UA NONE SEEN NONE SEEN   Granular Casts, UA PRESENT    Urine-Other MICROSCOPIC EXAM PERFORMED ON UNCONCENTRATED URINE     Comment: LESS THAN 10 mL OF URINE SUBMITTED  Urine culture     Status: None   Collection Time: 11/12/16 10:08 AM  Result Value Ref Range   Specimen Description URINE, RANDOM    Special Requests NONE    Culture      NO GROWTH Performed at [facility], [address] [place], [place] [address]    Report Status 11/13/2016 FINAL    Blood Culture ID Panel (Reflexed)     Status: Abnormal   Collection Time: 11/12/16 10:08 AM  Result Value Ref Range   Enterococcus species NOT DETECTED NOT DETECTED   Listeria monocytogenes NOT DETECTED NOT DETECTED   Staphylococcus species DETECTED (A) NOT DETECTED    Comment: Methicillin (oxacillin) resistant coagulase negative staphylococcus. Possible blood culture contaminant (unless isolated from more than one blood culture draw or clinical case suggests pathogenicity). No antibiotic treatment is indicated for blood  culture contaminants. CRITICAL RESULT CALLED TO, READ BACK BY AND VERIFIED WITH: MATT MCBANE @ 1245 ON 11/13/2016 BY CAF    Staphylococcus aureus NOT DETECTED NOT DETECTED   Methicillin resistance DETECTED (A) NOT DETECTED    Comment: CRITICAL RESULT CALLED TO, READ BACK BY AND VERIFIED WITH: MATT MCBANE @ 8099 ON 11/13/2016 BY CAF    Streptococcus species NOT DETECTED NOT DETECTED  Streptococcus agalactiae NOT DETECTED NOT DETECTED   Streptococcus pneumoniae NOT DETECTED NOT DETECTED   Streptococcus pyogenes NOT DETECTED NOT DETECTED   Acinetobacter baumannii NOT DETECTED NOT DETECTED   Enterobacteriaceae species NOT DETECTED NOT DETECTED   Enterobacter cloacae complex NOT DETECTED NOT DETECTED   Escherichia coli NOT DETECTED NOT DETECTED   Klebsiella oxytoca NOT DETECTED NOT DETECTED   Klebsiella pneumoniae NOT DETECTED NOT DETECTED   Proteus species NOT DETECTED NOT DETECTED   Serratia marcescens NOT DETECTED NOT DETECTED   Haemophilus influenzae NOT DETECTED NOT DETECTED   Neisseria meningitidis NOT DETECTED NOT DETECTED   Pseudomonas aeruginosa NOT DETECTED NOT DETECTED   Candida albicans NOT DETECTED NOT DETECTED   Candida glabrata NOT DETECTED NOT DETECTED   Candida krusei NOT DETECTED NOT DETECTED   Candida parapsilosis NOT DETECTED NOT DETECTED   Candida tropicalis NOT DETECTED NOT DETECTED  Lactic acid, plasma     Status: None   Collection Time:  11/12/16  2:01 PM  Result Value Ref Range   Lactic Acid, Venous 1.4 0.5 - 1.9 mmol/L  CBC     Status: Abnormal   Collection Time: 11/12/16  3:36 PM  Result Value Ref Range   WBC 35.9 (H) 3.6 - 11.0 K/uL   RBC 3.86 3.80 - 5.20 MIL/uL   Hemoglobin 10.8 (L) 12.0 - 16.0 g/dL   HCT 35.1 35.0 - 47.0 %   MCV 90.9 80.0 - 100.0 fL   MCH 28.0 26.0 - 34.0 pg   MCHC 30.8 (L) 32.0 - 36.0 g/dL   RDW 17.0 (H) 11.5 - 14.5 %   Platelets 147 (L) 150 - 440 K/uL  Basic metabolic panel     Status: Abnormal   Collection Time: 11/12/16  3:36 PM  Result Value Ref Range   Sodium 160 (H) 135 - 145 mmol/L   Potassium 3.4 (L) 3.5 - 5.1 mmol/L   Chloride >130 (HH) 101 - 111 mmol/L    Comment: RESULT CONFIRMED BY MANUAL DILUTION. TCH. CRITICAL RESULT CALLED TO, READ BACK BY AND VERIFIED WITH AMY DALTON @ 1616 11/12/16 BY TCH.    CO2 15 (L) 22 - 32 mmol/L   Glucose, Bld 273 (H) 65 - 99 mg/dL   BUN 40 (H) 6 - 20 mg/dL   Creatinine, Ser 1.59 (H) 0.44 - 1.00 mg/dL   Calcium 8.5 (L) 8.9 - 10.3 mg/dL   GFR calc non Af Amer 27 (L) >60 mL/min   GFR calc Af Amer 31 (L) >60 mL/min    Comment: (NOTE) The eGFR has been calculated using the CKD EPI equation. This calculation has not been validated in all clinical situations. eGFR's persistently <60 mL/min signify possible Chronic Kidney Disease.    Anion gap NOT CALCULATED 5 - 15  Basic metabolic panel     Status: Abnormal   Collection Time: 11/12/16  7:35 PM  Result Value Ref Range   Sodium 158 (H) 135 - 145 mmol/L   Potassium 3.0 (L) 3.5 - 5.1 mmol/L   Chloride >130 (HH) 101 - 111 mmol/L    Comment: RESULT CONFIRMED BY MANUAL DILUTION. TCH. CRITICAL RESULT CALLED TO, READ BACK BY AND VERIFIED WITH KARA CAMPBELL @ 2005 11/12/16 BY TCH    CO2 17 (L) 22 - 32 mmol/L   Glucose, Bld 316 (H) 65 - 99 mg/dL   BUN 40 (H) 6 - 20 mg/dL   Creatinine, Ser 1.46 (H) 0.44 - 1.00 mg/dL   Calcium 8.2 (L) 8.9 - 10.3 mg/dL  GFR calc non Af Amer 30 (L) >60 mL/min   GFR calc  Af Amer 35 (L) >60 mL/min    Comment: (NOTE) The eGFR has been calculated using the CKD EPI equation. This calculation has not been validated in all clinical situations. eGFR's persistently <60 mL/min signify possible Chronic Kidney Disease.    Anion gap NOT CALCULATED 5 - 15  Glucose, capillary     Status: Abnormal   Collection Time: 11/13/16 12:05 AM  Result Value Ref Range   Glucose-Capillary 234 (H) 65 - 99 mg/dL  Basic metabolic panel     Status: Abnormal   Collection Time: 11/13/16  1:20 AM  Result Value Ref Range   Sodium 159 (H) 135 - 145 mmol/L   Potassium 3.9 3.5 - 5.1 mmol/L   Chloride >130 (HH) 101 - 111 mmol/L    Comment: CRITICAL RESULT CALLED TO, READ BACK BY AND VERIFIED WITH: KARA CAMPBELL @ 0150 ON 11/13/2016 BY CAF    CO2 20 (L) 22 - 32 mmol/L   Glucose, Bld 252 (H) 65 - 99 mg/dL   BUN 40 (H) 6 - 20 mg/dL   Creatinine, Ser 1.33 (H) 0.44 - 1.00 mg/dL   Calcium 8.3 (L) 8.9 - 10.3 mg/dL   GFR calc non Af Amer 33 (L) >60 mL/min   GFR calc Af Amer 39 (L) >60 mL/min    Comment: (NOTE) The eGFR has been calculated using the CKD EPI equation. This calculation has not been validated in all clinical situations. eGFR's persistently <60 mL/min signify possible Chronic Kidney Disease.    Anion gap NOT CALCULATED 5 - 15  Glucose, capillary     Status: Abnormal   Collection Time: 11/13/16  5:30 AM  Result Value Ref Range   Glucose-Capillary 174 (H) 65 - 99 mg/dL  Basic metabolic panel     Status: Abnormal   Collection Time: 11/13/16  7:37 AM  Result Value Ref Range   Sodium 154 (H) 135 - 145 mmol/L   Potassium 4.2 3.5 - 5.1 mmol/L   Chloride >130 (HH) 101 - 111 mmol/L    Comment: CRITICAL RESULT CALLED TO, READ BACK BY AND VERIFIED WITH: AMY DALTON AT 1216 ON 11/13/16 [facility].    CO2 17 (L) 22 - 32 mmol/L   Glucose, Bld 199 (H) 65 - 99 mg/dL   BUN 39 (H) 6 - 20 mg/dL   Creatinine, Ser 1.53 (H) 0.44 - 1.00 mg/dL   Calcium 8.2 (L) 8.9 - 10.3 mg/dL   GFR calc non Af  Amer 28 (L) >60 mL/min   GFR calc Af Amer 33 (L) >60 mL/min    Comment: (NOTE) The eGFR has been calculated using the CKD EPI equation. This calculation has not been validated in all clinical situations. eGFR's persistently <60 mL/min signify possible Chronic Kidney Disease.    Anion gap NOT CALCULATED 5 - 15  CBC     Status: Abnormal   Collection Time: 11/13/16  7:37 AM  Result Value Ref Range   WBC 39.6 (H) 3.6 - 11.0 K/uL   RBC 3.49 (L) 3.80 - 5.20 MIL/uL   Hemoglobin 9.9 (L) 12.0 - 16.0 g/dL   HCT 31.6 (L) 35.0 - 47.0 %   MCV 90.7 80.0 - 100.0 fL   MCH 28.3 26.0 - 34.0 pg   MCHC 31.2 (L) 32.0 - 36.0 g/dL   RDW 16.4 (H) 11.5 - 14.5 %   Platelets 131 (L) 150 - 440 K/uL  Glucose, capillary  Status: Abnormal   Collection Time: 11/13/16 12:00 PM  Result Value Ref Range   Glucose-Capillary 210 (H) 65 - 99 mg/dL  Basic metabolic panel     Status: Abnormal   Collection Time: 11/13/16  1:17 PM  Result Value Ref Range   Sodium 155 (H) 135 - 145 mmol/L   Potassium 4.3 3.5 - 5.1 mmol/L   Chloride >130 (HH) 101 - 111 mmol/L    Comment: CRITICAL RESULT CALLED TO, READ BACK BY AND VERIFIED WITH AMY DALTON AT 6440 11/13/2016.  TFK    CO2 17 (L) 22 - 32 mmol/L   Glucose, Bld 220 (H) 65 - 99 mg/dL   BUN 38 (H) 6 - 20 mg/dL   Creatinine, Ser 1.26 (H) 0.44 - 1.00 mg/dL   Calcium 8.7 (L) 8.9 - 10.3 mg/dL   GFR calc non Af Amer 36 (L) >60 mL/min   GFR calc Af Amer 41 (L) >60 mL/min    Comment: (NOTE) The eGFR has been calculated using the CKD EPI equation. This calculation has not been validated in all clinical situations. eGFR's persistently <60 mL/min signify possible Chronic Kidney Disease.    Anion gap NOT CALCULATED 5 - 15   No components found for: ESR, C REACTIVE PROTEIN MICRO: Recent Results (from the past 720 hour(s))  Blood Culture (routine x 2)     Status: None (Preliminary result)   Collection Time: 11/12/16 10:08 AM  Result Value Ref Range Status   Specimen  Description BLOOD RIGHT HAND  Final   Special Requests   Final    BOTTLES DRAWN AEROBIC AND ANAEROBIC Blood Culture adequate volume   Culture  Setup Time   Final    GRAM POSITIVE COCCI AEROBIC BOTTLE ONLY CRITICAL VALUE NOTED.  VALUE IS CONSISTENT WITH PREVIOUSLY REPORTED AND CALLED VALUE.    Culture GRAM POSITIVE COCCI  Final   Report Status PENDING  Incomplete  Blood Culture (routine x 2)     Status: None (Preliminary result)   Collection Time: 11/12/16 10:08 AM  Result Value Ref Range Status   Specimen Description BLOOD LEFT HAND  Final   Special Requests   Final    BOTTLES DRAWN AEROBIC AND ANAEROBIC Blood Culture adequate volume   Culture  Setup Time   Final    GRAM POSITIVE COCCI IN BOTH AEROBIC AND ANAEROBIC BOTTLES CRITICAL RESULT CALLED TO, READ BACK BY AND VERIFIED WITH: MATT MCBANE @ 3474 ON 11/13/2016 BY CAF    Culture GRAM POSITIVE COCCI  Final   Report Status PENDING  Incomplete  Urine culture     Status: None   Collection Time: 11/12/16 10:08 AM  Result Value Ref Range Status   Specimen Description URINE, RANDOM  Final   Special Requests NONE  Final   Culture   Final    NO GROWTH Performed at [facility], [address] [place], [place] [address]    Report Status 11/13/2016 FINAL  Final  Blood Culture ID Panel (Reflexed)     Status: Abnormal   Collection Time: 11/12/16 10:08 AM  Result Value Ref Range Status   Enterococcus species NOT DETECTED NOT DETECTED Final   Listeria monocytogenes NOT DETECTED NOT DETECTED Final   Staphylococcus species DETECTED (A) NOT DETECTED Final    Comment: Methicillin (oxacillin) resistant coagulase negative staphylococcus. Possible blood culture contaminant (unless isolated from more than one blood culture draw or clinical case suggests pathogenicity). No antibiotic treatment is indicated for blood  culture contaminants. CRITICAL RESULT CALLED  TO, READ BACK BY AND VERIFIED WITH: MATT MCBANE @ 7011 ON 11/13/2016 BY CAF     Staphylococcus aureus NOT DETECTED NOT DETECTED Final   Methicillin resistance DETECTED (A) NOT DETECTED Final    Comment: CRITICAL RESULT CALLED TO, READ BACK BY AND VERIFIED WITH: MATT MCBANE @ 0034 ON 11/13/2016 BY CAF    Streptococcus species NOT DETECTED NOT DETECTED Final   Streptococcus agalactiae NOT DETECTED NOT DETECTED Final   Streptococcus pneumoniae NOT DETECTED NOT DETECTED Final   Streptococcus pyogenes NOT DETECTED NOT DETECTED Final   Acinetobacter baumannii NOT DETECTED NOT DETECTED Final   Enterobacteriaceae species NOT DETECTED NOT DETECTED Final   Enterobacter cloacae complex NOT DETECTED NOT DETECTED Final   Escherichia coli NOT DETECTED NOT DETECTED Final   Klebsiella oxytoca NOT DETECTED NOT DETECTED Final   Klebsiella pneumoniae NOT DETECTED NOT DETECTED Final   Proteus species NOT DETECTED NOT DETECTED Final   Serratia marcescens NOT DETECTED NOT DETECTED Final   Haemophilus influenzae NOT DETECTED NOT DETECTED Final   Neisseria meningitidis NOT DETECTED NOT DETECTED Final   Pseudomonas aeruginosa NOT DETECTED NOT DETECTED Final   Candida albicans NOT DETECTED NOT DETECTED Final   Candida glabrata NOT DETECTED NOT DETECTED Final   Candida krusei NOT DETECTED NOT DETECTED Final   Candida parapsilosis NOT DETECTED NOT DETECTED Final   Candida tropicalis NOT DETECTED NOT DETECTED Final    IMAGING: Dg Chest Port 1 View  Result Date: 11/12/2016 CLINICAL DATA:  Hypoxia EXAM: PORTABLE CHEST 1 VIEW COMPARISON:  07/10/2015 FINDINGS: Pacing device is again seen. Cardiac enlargement is again noted. The lungs are well aerated bilaterally. No focal infiltrate or sizable effusion is seen. No bony abnormality is noted. Postsurgical changes again seen. IMPRESSION: No acute abnormality noted. Electronically Signed   By: Inez Catalina M.D.   On: 11/12/2016 11:09    Assessment:   Jasmine Briggs is a 83 y.o. female with advanced dementia, largely bedbound, hx healing decub ulcers  admitted with fevers, AMS, Na 161,and WBC 36.  BCX + Staph species (MR) in 2/2 sets. Complicated by fact that she has a PPM. 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    Recommendations Repeat bcx x 2  Check echo - consider TEE but poor candidate Cont vanco If remains afebrile and wbc decreasing can stop the zosyn tomorrow. May need EP eval if evidence of PPM infection Thank you very much for allowing me to participate in the care of this patient. Please call with questions.   Cheral Marker. Ola Spurr, MD

## 2016-11-13 NOTE — Progress Notes (Signed)
Patient ID: Jasmine Briggs, female   DOB: Sep 20, 1920, 83 y.o.   MRN: [id number]  [employer] PROGRESS NOTE  Jasmine Briggs WUJ:[id number] DOB: March 08, 1921 DOA: 11/12/2016 PCP: Kurtis Bushman, MD  HPI/Subjective: Patient was able to tell me her name. Unable to tell me much more. Family states that normally she does quite a bit of singing.  She hasn't been eating too well lately.  Objective: Vitals:   11/13/16 0528 11/13/16 1127  BP: 118/60 (!) 155/78  Pulse: 70 70  Resp: 16 18  Temp:  98 F (36.7 C)  SpO2: 94% 90%    Filed Weights   11/12/16 1005 11/13/16 0500  Weight: 40.8 kg (90 lb) 40.7 kg (89 lb 12.8 oz)    ROS: Review of Systems  Unable to perform ROS: Acuity of condition   Exam: Physical Exam  Constitutional: She appears lethargic.  HENT:  Nose: No mucosal edema.  Unable to look in mouth  Eyes: Pupils are equal, round, and reactive to light. Conjunctivae and lids are normal.  Neck: No JVD present. Carotid bruit is not present.  Cardiovascular: S1 normal, S2 normal and normal heart sounds.   Respiratory: She has decreased breath sounds in the right lower field and the left lower field. She has rhonchi in the right middle field, the right lower field, the left middle field and the left lower field.  GI: Soft. Bowel sounds are normal. There is no tenderness.  Musculoskeletal:       Right ankle: She exhibits no swelling.       Left ankle: She exhibits no swelling.  Neurological: She appears lethargic.  Skin:  A few decubitus ulcers on bilateral buttock. The largest one likely a stage III and the other is her likely stage II. Does not look to be the source of infection.  Psychiatric:  Lethargic but told me her name      Data Reviewed: Basic Metabolic Panel:  Recent Labs Lab 11/12/16 1008 11/12/16 1536 11/12/16 1935 11/13/16 0120 11/13/16 0737  NA 161* 160* 158* 159* 154*  K 4.0 3.4* 3.0* 3.9 4.2  CL >130* >130* >130* >130* >130*  CO2 15* 15* 17* 20*  17*  GLUCOSE 197* 273* 316* 252* 199*  BUN 38* 40* 40* 40* 39*  CREATININE 1.77* 1.59* 1.46* 1.33* 1.53*  CALCIUM 9.7 8.5* 8.2* 8.3* 8.2*   Liver Function Tests:  Recent Labs Lab 11/12/16 1008  AST 27  ALT 11*  ALKPHOS 42  BILITOT 1.3*  PROT 7.6  ALBUMIN 3.0*    Recent Labs Lab 11/12/16 1008  LIPASE 25   CBC:  Recent Labs Lab 11/12/16 1008 11/12/16 1536 11/13/16 0737  WBC 36.6* 35.9* 39.6*  NEUTROABS 34.0*  --   --   HGB 11.8* 10.8* 9.9*  HCT 37.8 35.1 31.6*  MCV 91.5 90.9 90.7  PLT 195 147* 131*   Cardiac Enzymes:  Recent Labs Lab 11/12/16 1008  TROPONINI 0.34*    CBG:  Recent Labs Lab 11/13/16 0005 11/13/16 0530 11/13/16 1200  GLUCAP 234* 174* 210*    Recent Results (from the past 240 hour(s))  Blood Culture (routine x 2)     Status: None (Preliminary result)   Collection Time: 11/12/16 10:08 AM  Result Value Ref Range Status   Specimen Description BLOOD RIGHT HAND  Final   Special Requests   Final    BOTTLES DRAWN AEROBIC AND ANAEROBIC Blood Culture adequate volume   Culture  Setup Time   Final    GRAM POSITIVE  COCCI AEROBIC BOTTLE ONLY CRITICAL VALUE NOTED.  VALUE IS CONSISTENT WITH PREVIOUSLY REPORTED AND CALLED VALUE.    Culture GRAM POSITIVE COCCI  Final   Report Status PENDING  Incomplete  Blood Culture (routine x 2)     Status: None (Preliminary result)   Collection Time: 11/12/16 10:08 AM  Result Value Ref Range Status   Specimen Description BLOOD LEFT HAND  Final   Special Requests   Final    BOTTLES DRAWN AEROBIC AND ANAEROBIC Blood Culture adequate volume   Culture  Setup Time   Final    GRAM POSITIVE COCCI IN BOTH AEROBIC AND ANAEROBIC BOTTLES CRITICAL RESULT CALLED TO, READ BACK BY AND VERIFIED WITH: MATT MCBANE @ 0553 ON 11/13/2016 BY CAF    Culture GRAM POSITIVE COCCI  Final   Report Status PENDING  Incomplete  Urine culture     Status: None   Collection Time: 11/12/16 10:08 AM  Result Value Ref Range Status    Specimen Description URINE, RANDOM  Final   Special Requests NONE  Final   Culture   Final    NO GROWTH Performed at [facility], [address] [place], [place] [address]    Report Status 11/13/2016 FINAL  Final  Blood Culture ID Panel (Reflexed)     Status: Abnormal   Collection Time: 11/12/16 10:08 AM  Result Value Ref Range Status   Enterococcus species NOT DETECTED NOT DETECTED Final   Listeria monocytogenes NOT DETECTED NOT DETECTED Final   Staphylococcus species DETECTED (A) NOT DETECTED Final    Comment: Methicillin (oxacillin) resistant coagulase negative staphylococcus. Possible blood culture contaminant (unless isolated from more than one blood culture draw or clinical case suggests pathogenicity). No antibiotic treatment is indicated for blood  culture contaminants. CRITICAL RESULT CALLED TO, READ BACK BY AND VERIFIED WITH: MATT MCBANE @ 0553 ON 11/13/2016 BY CAF    Staphylococcus aureus NOT DETECTED NOT DETECTED Final   Methicillin resistance DETECTED (A) NOT DETECTED Final    Comment: CRITICAL RESULT CALLED TO, READ BACK BY AND VERIFIED WITH: MATT MCBANE @ 0553 ON 11/13/2016 BY CAF    Streptococcus species NOT DETECTED NOT DETECTED Final   Streptococcus agalactiae NOT DETECTED NOT DETECTED Final   Streptococcus pneumoniae NOT DETECTED NOT DETECTED Final   Streptococcus pyogenes NOT DETECTED NOT DETECTED Final   Acinetobacter baumannii NOT DETECTED NOT DETECTED Final   Enterobacteriaceae species NOT DETECTED NOT DETECTED Final   Enterobacter cloacae complex NOT DETECTED NOT DETECTED Final   Escherichia coli NOT DETECTED NOT DETECTED Final   Klebsiella oxytoca NOT DETECTED NOT DETECTED Final   Klebsiella pneumoniae NOT DETECTED NOT DETECTED Final   Proteus species NOT DETECTED NOT DETECTED Final   Serratia marcescens NOT DETECTED NOT DETECTED Final   Haemophilus influenzae NOT DETECTED NOT DETECTED Final   Neisseria meningitidis NOT DETECTED NOT DETECTED Final    Pseudomonas aeruginosa NOT DETECTED NOT DETECTED Final   Candida albicans NOT DETECTED NOT DETECTED Final   Candida glabrata NOT DETECTED NOT DETECTED Final   Candida krusei NOT DETECTED NOT DETECTED Final   Candida parapsilosis NOT DETECTED NOT DETECTED Final   Candida tropicalis NOT DETECTED NOT DETECTED Final     Studies: Dg Chest Port 1 View  Result Date: 11/12/2016 CLINICAL DATA:  Hypoxia EXAM: PORTABLE CHEST 1 VIEW COMPARISON:  07/10/2015 FINDINGS: Pacing device is again seen. Cardiac enlargement is again noted. The lungs are well aerated bilaterally. No focal infiltrate or sizable effusion is seen. No bony abnormality  is noted. Postsurgical changes again seen. IMPRESSION: No acute abnormality noted. Electronically Signed   By: Alcide Clever M.D.   On: 11/12/2016 11:09    Scheduled Meds: . chlorhexidine  15 mL Mouth Rinse BID  . docusate sodium  100 mg Oral BID  . heparin  5,000 Units Subcutaneous Q8H  . insulin aspart  0-9 Units Subcutaneous Q6H  . ipratropium-albuterol  3 mL Nebulization Q6H  . mouth rinse  15 mL Mouth Rinse q12n4p   Continuous Infusions: . dextrose 5 % with KCl 20 mEq / L 20 mEq (11/12/16 2141)  . piperacillin-tazobactam (ZOSYN)  IV 3.375 g (11/13/16 1124)  . vancomycin      Assessment/Plan:  1. Sepsis with staph species and methicillin resistance detected. Elevated white blood cell count, fever and tachypnea. Follow-up final blood culture results. ID consultation. Empiric antibiotics with vancomycin and Zosyn. Echocardiogram ordered. Family states poor dentition but I am unable to look in the patient's mouth. Sacral decubitus does not look like the source of infection. I will repeat a chest x-ray to see if pneumonia could be a source. 2. Severe hypernatremia.  This is a poor prognostic sign. This was discussed with the family also. Swallow evaluation. D5W 3. Severe protein calorie malnutrition. This is also poor prognostic sign. 4. Type 2 diabetes mellitus  on sliding scale. I'm hesitant on starting any Lantus insulin since the patient is not eating. 5. History of dementia without behavioral disturbance 6. Decubitus ulcers on the buttock ranging between stage II and stage III. Wound care consultation. I asked the nurse to keep urine out of the wound with the noninvasive urine collection system. 7. Acute kidney injury on chronic kidney disease stage III. Monitor with IV fluid hydration 8. Hypokalemia improved with potassium in IV fluids 9. Anemia. Continue to monitor  Code Status:     Code Status Orders        Start     Ordered   11/12/16 1353  Do not attempt resuscitation (DNR)  Continuous    Question Answer Comment  In the event of cardiac or respiratory ARREST Do not call a "code blue"   In the event of cardiac or respiratory ARREST Do not perform Intubation, CPR, defibrillation or ACLS   In the event of cardiac or respiratory ARREST Use medication by any route, position, wound care, and other measures to relive pain and suffering. May use oxygen, suction and manual treatment of airway obstruction as needed for comfort.      11/12/16 1352    Code Status History    Date Active Date Inactive Code Status Order ID Comments User Context   11/12/2016  1:39 PM 11/12/2016  1:52 PM Full Code [id number]  Katha Hamming, MD ED   07/10/2015 10:55 PM 07/26/2015  8:43 PM DNR [id number]  Oralia Manis, MD Inpatient   06/24/2015  5:41 PM 06/26/2015  7:08 PM DNR [id number]  Suan Halter, MD Inpatient   06/21/2015  8:04 PM 06/24/2015  5:41 PM Full Code [id number]  Houston Siren, MD Inpatient   06/12/2015  8:35 PM 06/15/2015  7:56 PM Full Code [id number]  Enid Baas, MD Inpatient    Advance Directive Documentation     Most Recent Value  Type of Advance Directive  Out of facility DNR (pink MOST or yellow form)  Pre-existing out of facility DNR order (yellow form or pink MOST form)  Yellow form placed in chart (order not valid for inpatient use)   "MOST" Form in Place?  -  Family Communication: Spoke with 2 daughters at the bedside Disposition Plan: Family would like to take the patient home once hospitalization is over, even if that includes IV antibiotics.  Consultants:  Infectious disease  Palliative care  Nephrology  Antibiotics:  Vancomycin  Zosyn  Time spent: 35 minutes in coordination of care. Case discussed with nephrology and infectious disease and palliative care.  Alford Highland  [employer]

## 2016-11-13 NOTE — Progress Notes (Signed)
PHARMACY - PHYSICIAN COMMUNICATION CRITICAL VALUE ALERT - BLOOD CULTURE IDENTIFICATION (BCID)  Results for orders placed or performed during the hospital encounter of 11/12/16  Blood Culture ID Panel (Reflexed) (Collected: 11/12/2016 10:08 AM)  Result Value Ref Range   Enterococcus species NOT DETECTED NOT DETECTED   Listeria monocytogenes NOT DETECTED NOT DETECTED   Staphylococcus species DETECTED (A) NOT DETECTED   Staphylococcus aureus NOT DETECTED NOT DETECTED   Methicillin resistance DETECTED (A) NOT DETECTED   Streptococcus species NOT DETECTED NOT DETECTED   Streptococcus agalactiae NOT DETECTED NOT DETECTED   Streptococcus pneumoniae NOT DETECTED NOT DETECTED   Streptococcus pyogenes NOT DETECTED NOT DETECTED   Acinetobacter baumannii NOT DETECTED NOT DETECTED   Enterobacteriaceae species NOT DETECTED NOT DETECTED   Enterobacter cloacae complex NOT DETECTED NOT DETECTED   Escherichia coli NOT DETECTED NOT DETECTED   Klebsiella oxytoca NOT DETECTED NOT DETECTED   Klebsiella pneumoniae NOT DETECTED NOT DETECTED   Proteus species NOT DETECTED NOT DETECTED   Serratia marcescens NOT DETECTED NOT DETECTED   Haemophilus influenzae NOT DETECTED NOT DETECTED   Neisseria meningitidis NOT DETECTED NOT DETECTED   Pseudomonas aeruginosa NOT DETECTED NOT DETECTED   Candida albicans NOT DETECTED NOT DETECTED   Candida glabrata NOT DETECTED NOT DETECTED   Candida krusei NOT DETECTED NOT DETECTED   Candida parapsilosis NOT DETECTED NOT DETECTED   Candida tropicalis NOT DETECTED NOT DETECTED    Name of physician (or Provider) ContactedSheryle Hail  Changes to prescribed antibiotics required: n/a  Geremiah Fussell S 11/13/2016  6:31 AM

## 2016-11-13 NOTE — Progress Notes (Addendum)
Initial Nutrition Assessment  DOCUMENTATION CODES:   Severe malnutrition in context of chronic illness  INTERVENTION:   RD will order supplements pending SLP evaluation- recommend Mighty Shake II TID and Magic Cups TID  NUTRITION DIAGNOSIS:   Malnutrition (severe) related to chronic illness (severe dementia) as evidenced by severe depletion of body fat, severe depletion of muscle mass, percent weight loss.  GOAL:   Patient will meet greater than or equal to 90% of their needs  MONITOR:   PO intake, Supplement acceptance, Labs, Weight trends, Skin  REASON FOR ASSESSMENT:   Other (Comment) (low BMI)    ASSESSMENT:   83 y.o. female  with past medical history of sacral osteomyelitis, dementia, pacemaker placement, CHF, and gout who was admitted on 11/12/2016 with shortness of breath x 3 days. Work up indicated hypoxic respiratory failure, and sepsis with a temperature of 104.6. Blood cultures are positive for MRSA.  Met with pt and pt's daughter in room today. Pt with severe dementia so history obtained from pt's daughters. Family reports that pt is a good eater at home and that she was eating well up until 2 days pta. Pt's daughters prepare all of her meals and report that she cleans her plate. Pt also drinks Ensure (chocolate and strawberry) regularly and eats lots of ice cream. Pt is on a pureed diet at home with nectar thick liquids. Daughter reports that she prepares pt's meats in the blender and serves her all soft, well cooked foods. Daughter does report that occasionally, when she feels her mother is more alert, she will finely chop pt's meat instead of puree them; SLP made aware of this today. Family is very involved with this pt at meal time; they make sure she is sitting up and they hand feed her. Pt is currently NPO and awaiting SLP evaluation. Per chart, pt with significant wt loss. It appears that pt has lost 96lbs(52%) in a little over one year. RD unsure if admit wt is  accurate. Pt's family reports some weight loss but not significant amounts; RD has requested for pt to be weighed again. RD will order supplements pending SLP evaluation.      Medications reviewed and include: colace, heparin, insulin, 5% Dex w/ KCl, zosyn, vancomycin   Labs reviewed: Na 154(H), Cl >130(H), BUN 39(H), creat 1.53(H), Ca 8.2(L) Wbc- 39.6(H), Hgb 9.9(L), Hct 31.6(H) cbgs- 197, 272, 316, 252, 199 x 24 hrs  Nutrition-Focused physical exam completed. Findings are severe fat and muscle depletions over entire body, and no edema.   Diet Order:  Diet NPO time specified Except for: Other (See Comments)  Skin:  Wound (see comment) (Stage III coccyx)  Last BM:  PTA  Height:   Ht Readings from Last 1 Encounters:  11/12/16 5' 2"  (1.575 m)    Weight:   Wt Readings from Last 1 Encounters:  11/13/16 89 lb 12.8 oz (40.7 kg)    Ideal Body Weight:  50 kg  BMI:  Body mass index is 16.42 kg/m.  Estimated Nutritional Needs:   Kcal:  1500-1800kcal/day   Protein:  81-89g/day   Fluid:  >1.5L/day   EDUCATION NEEDS:   Education needs no appropriate at this time  Koleen Distance MS, RD, [place] Pager #[phone] After Hours Pager: [phone]

## 2016-11-13 NOTE — Progress Notes (Signed)
SLP Cancellation Note  Patient Details Name: Jasmine Briggs MRN: [id number] DOB: 09-Sep-1920   Cancelled treatment:       Reason Eval/Treat Not Completed: Medical issues which prohibited therapy;Patient not medically ready (chart reviewed; NSG consulted, met w/ family in AM). Attempted to see pt today; pt lethargic and only grunted to Son earlier. NSG indicated pt is not appropriate for any assessment today d/t medical status(sepsis; hypoxia). Pt is NPO. At baseline, she is on a Dysphagia level 1(puree) w/ Nectar consistency liquids diet at home - per recommendation. ST services will f/u w/ NSG/pt tomorrow for appropriateness for evaluation/po diet. Recommend oral care. Son/NSG agreed.    Orinda Kenner, MS, CCC-SLP Emmanuelle Hibbitts 11/13/2016, 1:44 PM

## 2016-11-13 NOTE — Clinical Social Work Note (Addendum)
CSW consulted for nursing home placement. Patient currently unable to participate in physical therapy and thus would not be appropriate for STR. Palliative Care has spoken with family and at this time Palliative Care is documenting the discharge disposition as being home with hospice versus hospital death. In addition, the attending MD has requested that CSW defer assessment at this time.  York Spaniel MSW,LCSW [phone]

## 2016-11-13 NOTE — Progress Notes (Signed)
[facility], [place] 11/13/16  Subjective:   Patient known to our practice from previous admissions. She is brought from home for altered mental status Nephrology consult requested for severe hypernatremia with admission sodium of 161 Patient was placed on D5W. Today's sodium has improved to 154 Patient is not responsive. Her son is at bedside. Patient is not able to find any meaningful information.   Objective:  Vital signs in last 24 hours:  Temp:  [97.7 F (36.5 C)-104.6 F (40.3 C)] 97.7 F (36.5 C) (08/23 1932) Pulse Rate:  [70-80] 70 (08/24 0528) Resp:  [15-38] 16 (08/24 0528) BP: (96-165)/(56-95) 118/60 (08/24 0528) SpO2:  [90 %-100 %] 94 % (08/24 0528) Weight:  [40.7 kg (89 lb 12.8 oz)-40.8 kg (90 lb)] 40.7 kg (89 lb 12.8 oz) (08/24 0500)  Weight change:  Filed Weights   11/12/16 1005 11/13/16 0500  Weight: 40.8 kg (90 lb) 40.7 kg (89 lb 12.8 oz)    Intake/Output:    Intake/Output Summary (Last 24 hours) at 11/13/16 0847 Last data filed at 11/13/16 0539  Gross per 24 hour  Intake           3482.5 ml  Output                0 ml  Net           3482.5 ml     Physical Exam: General: Thin, frail, elderly woman, lying in the bed  HEENT Dry oral mucous membranes  Neck No masses  Pulm/lungs Shallow breathing effort, no crackles  CVS/Heart Irregular  Abdomen:  Soft, nontender  Extremities: No edema  Neurologic: Patient is bedridden, muscle wasting   Skin: No acute rashes          Basic Metabolic Panel:   Recent Labs Lab 11/12/16 1008 11/12/16 1536 11/12/16 1935 11/13/16 0120 11/13/16 0737  NA 161* 160* 158* 159* 154*  K 4.0 3.4* 3.0* 3.9 4.2  CL >130* >130* >130* >130* >130*  CO2 15* 15* 17* 20* 17*  GLUCOSE 197* 273* 316* 252* 199*  BUN 38* 40* 40* 40* 39*  CREATININE 1.77* 1.59* 1.46* 1.33* 1.53*  CALCIUM 9.7 8.5* 8.2* 8.3* 8.2*     CBC:  Recent Labs Lab 11/12/16 1008 11/12/16 1536 11/13/16 0737  WBC 36.6*  35.9* 39.6*  NEUTROABS 34.0*  --   --   HGB 11.8* 10.8* 9.9*  HCT 37.8 35.1 31.6*  MCV 91.5 90.9 90.7  PLT 195 147* 131*     No results found for: HEPBSAG, HEPBSAB, HEPBIGM    Microbiology:  Recent Results (from the past 240 hour(s))  Blood Culture (routine x 2)     Status: None (Preliminary result)   Collection Time: 11/12/16 10:08 AM  Result Value Ref Range Status   Specimen Description BLOOD RIGHT HAND  Final   Special Requests   Final    BOTTLES DRAWN AEROBIC AND ANAEROBIC Blood Culture adequate volume   Culture  Setup Time   Final    GRAM POSITIVE COCCI AEROBIC BOTTLE ONLY CRITICAL VALUE NOTED.  VALUE IS CONSISTENT WITH PREVIOUSLY REPORTED AND CALLED VALUE.    Culture GRAM POSITIVE COCCI  Final   Report Status PENDING  Incomplete  Blood Culture (routine x 2)     Status: None (Preliminary result)   Collection Time: 11/12/16 10:08 AM  Result Value Ref Range Status   Specimen Description BLOOD LEFT HAND  Final   Special Requests   Final    BOTTLES DRAWN  AEROBIC AND ANAEROBIC Blood Culture adequate volume   Culture  Setup Time   Final    Organism ID to follow GRAM POSITIVE COCCI IN BOTH AEROBIC AND ANAEROBIC BOTTLES CRITICAL RESULT CALLED TO, READ BACK BY AND VERIFIED WITH: MATT MCBANE @ 0553 ON 11/13/2016 BY CAF    Culture GRAM POSITIVE COCCI  Final   Report Status PENDING  Incomplete  Blood Culture ID Panel (Reflexed)     Status: Abnormal   Collection Time: 11/12/16 10:08 AM  Result Value Ref Range Status   Enterococcus species NOT DETECTED NOT DETECTED Final   Listeria monocytogenes NOT DETECTED NOT DETECTED Final   Staphylococcus species DETECTED (A) NOT DETECTED Final    Comment: Methicillin (oxacillin) resistant coagulase negative staphylococcus. Possible blood culture contaminant (unless isolated from more than one blood culture draw or clinical case suggests pathogenicity). No antibiotic treatment is indicated for blood  culture contaminants. CRITICAL RESULT  CALLED TO, READ BACK BY AND VERIFIED WITH: MATT MCBANE @ 0553 ON 11/13/2016 BY CAF    Staphylococcus aureus NOT DETECTED NOT DETECTED Final   Methicillin resistance DETECTED (A) NOT DETECTED Final    Comment: CRITICAL RESULT CALLED TO, READ BACK BY AND VERIFIED WITH: MATT MCBANE @ 0553 ON 11/13/2016 BY CAF    Streptococcus species NOT DETECTED NOT DETECTED Final   Streptococcus agalactiae NOT DETECTED NOT DETECTED Final   Streptococcus pneumoniae NOT DETECTED NOT DETECTED Final   Streptococcus pyogenes NOT DETECTED NOT DETECTED Final   Acinetobacter baumannii NOT DETECTED NOT DETECTED Final   Enterobacteriaceae species NOT DETECTED NOT DETECTED Final   Enterobacter cloacae complex NOT DETECTED NOT DETECTED Final   Escherichia coli NOT DETECTED NOT DETECTED Final   Klebsiella oxytoca NOT DETECTED NOT DETECTED Final   Klebsiella pneumoniae NOT DETECTED NOT DETECTED Final   Proteus species NOT DETECTED NOT DETECTED Final   Serratia marcescens NOT DETECTED NOT DETECTED Final   Haemophilus influenzae NOT DETECTED NOT DETECTED Final   Neisseria meningitidis NOT DETECTED NOT DETECTED Final   Pseudomonas aeruginosa NOT DETECTED NOT DETECTED Final   Candida albicans NOT DETECTED NOT DETECTED Final   Candida glabrata NOT DETECTED NOT DETECTED Final   Candida krusei NOT DETECTED NOT DETECTED Final   Candida parapsilosis NOT DETECTED NOT DETECTED Final   Candida tropicalis NOT DETECTED NOT DETECTED Final    Coagulation Studies:  Recent Labs  11/12/16 1008  LABPROT 19.0*  INR 1.58    Urinalysis:  Recent Labs  11/12/16 1008  COLORURINE YELLOW  LABSPEC >1.030*  PHURINE 5.0  GLUCOSEU NEGATIVE  HGBUR MODERATE*  BILIRUBINUR MODERATE*  KETONESUR TRACE*  PROTEINUR >300*  NITRITE NEGATIVE  LEUKOCYTESUR NEGATIVE      Imaging: Dg Chest Port 1 View  Result Date: 11/12/2016 CLINICAL DATA:  Hypoxia EXAM: PORTABLE CHEST 1 VIEW COMPARISON:  07/10/2015 FINDINGS: Pacing device is again  seen. Cardiac enlargement is again noted. The lungs are well aerated bilaterally. No focal infiltrate or sizable effusion is seen. No bony abnormality is noted. Postsurgical changes again seen. IMPRESSION: No acute abnormality noted. Electronically Signed   By: Alcide Clever M.D.   On: 11/12/2016 11:09     Medications:   . dextrose 5 % with KCl 20 mEq / L 20 mEq (11/12/16 2141)  . piperacillin-tazobactam (ZOSYN)  IV Stopped (11/13/16 0215)  . vancomycin     . chlorhexidine  15 mL Mouth Rinse BID  . docusate sodium  100 mg Oral BID  . heparin  5,000 Units Subcutaneous Q8H  .  insulin aspart  0-9 Units Subcutaneous Q6H  . mouth rinse  15 mL Mouth Rinse q12n4p   acetaminophen **OR** acetaminophen, bisacodyl, ondansetron **OR** ondansetron (ZOFRAN) IV  Assessment/ Plan:  83 y.o. female   with severe dementia, diabetes, hypertension, gout, sacral decubitus, bedridden status, brought in by family from home for worsening shortness of breath, decreased responsiveness. Patient was found to have severe hypernatremia with sodium of 161 Nephrology consult has been requested for evaluation  1. Severe hypernatremia. Resulting from severe dehydration agree with IV D5W at the current rate. Sodium is correcting slowly but at an acceptable rate  2. MRSA Sepsis - Unclear source - Management as per primary team   LOS: 1 [facility] 8/24/20188:47 AM  [facility] [place], [place] [phone]

## 2016-11-13 NOTE — Care Management (Addendum)
CM consult for long term care needs. Spoke with daughter at bedside- Jasmine Briggs.   Patient presented form home.  She is total care patient and children: Jasmine Briggs and Jasmine Briggs are the caregivers.  Has a hospital bed.  Current oxygen requirements are acute.  Patient is not responding and her respirations are congested.   Jasmine Briggs says that "someone comes to the house about once a week."  CM has reached out to home health agencies to determine if patient is under service with anyone.  So far: [facility]asmine Briggs palliative- no; [facility], [facility], [facility] and [facility] - no.  Palliative is consulting and it is documents home with hospital vs hospital death.  Jasmine Briggs verbally informed CM that when patient goes home does not want hospice.  Patient has had home health through [facility], [facility] and [facility] in the past and declined hospice last year. Patient is DNR

## 2016-11-13 NOTE — Consult Note (Signed)
Consultation Note Date: 11/13/2016   Patient Name: Jasmine Briggs  DOB: Mar 24, 1920  MRN: [id number]  Age / Sex: 83 y.o., female  PCP: Caryl Never, MD Referring Physician: Loletha Grayer, MD  Reason for Consultation: Establishing goals of care  HPI/Patient Profile: 83 y.o. female  with past medical history of sacral osteomyelitis, dementia, pacemaker placement, CHF, and gout who was admitted on 11/12/2016 with shortness of breath x 3 days. Work up indicated hypoxic respiratory failure, and sepsis with a temperature of 104.6. Blood cultures are positive for MRSA.  Clinical Assessment and Goals of Care:  I have reviewed medical records including EPIC notes, labs and imaging, received report from the care team, assessed the patient and then met at the bedside along with 3 of her 6 children  to discuss diagnosis prognosis, GOC, EOL wishes, disposition and options.  I introduced Palliative Medicine as specialized medical care for people living with serious illness. It focuses on providing relief from the symptoms and stress of a serious illness. The goal is to improve quality of life for both the patient and the family.  We discussed a brief life review of the patient.  She has 6 children - they are all united in making decisions about her health care.  She lives at home with her son Jasmine Briggs.  Jasmine Briggs is there every day to help out as well.  At baseline she is dependent for all ADLs and bedbound.  She is able to sit up in bed and converse with family.  We discussed their current illness, MRSA bacteremia and that it is a very serious infection that could jeopardize her life.  The family was fairly firm that their mother "Jasmine Briggs" went thru the same thing last year.  They took her "home to die" last year.  They were told she had 3 days to 1 week.  They confirmed that they will take her home again as they have  good family support at home.  I posed a question about comfort medications vs longevity.  At this point the patient grunted twice.  The family took this as an indication that the patient was saying "No" to comfort medications.   Primary Decision Maker:  NEXT OF KIN 6 children.  Jasmine Briggs is listed as the primary contact.    SUMMARY OF RECOMMENDATIONS     When appropriate patient will be discharged to home.  Treat the treatable.  Code Status/[facility] Care Planning:  DNR / DNI   Additional Recommendations (Limitations, Scope, Preferences):  Full Scope Treatment   Psycho-social/Spiritual:   Desire for further Chaplaincy support: yes  Prognosis:  Uncertain.  Given her advanced age, frailty, stage 3 chronic kidney disease, electrolyte abnormalities, CHF, and severe infection she is certainly at high risk for an acute event that would end her life.    Discharge Planning: Home with Hospice vs hospital death.      Primary Diagnoses: Present on Admission: . Sepsis ([place])   I have reviewed the medical record, interviewed the patient and family, and  examined the patient. The following aspects are pertinent.  Past Medical History:  Diagnosis Date  . Arthritis   . Dementia   . Diabetes mellitus without complication (HCC)   . Gout   . Hypertension   . Osteomyelitis (HCC)    sacral wound   Social History   Social History  . Marital status: Widowed    Spouse name: N/A  . Number of children: N/A  . Years of education: N/A   Social History Main Topics  . Smoking status: Never Smoker  . Smokeless tobacco: Former User    Types: Snuff  . Alcohol use No  . Drug use: No  . Sexual activity: Not Asked   Other Topics Concern  . None   Social History Narrative   From home.    Family History  Problem Relation Age of Onset  . Diabetes Maternal Aunt    Scheduled Meds: . chlorhexidine  15 mL Mouth Rinse BID  . docusate sodium  100 mg Oral BID  . heparin  5,000 Units  Subcutaneous Q8H  . insulin aspart  0-9 Units Subcutaneous Q6H  . mouth rinse  15 mL Mouth Rinse q12n4p   Continuous Infusions: . dextrose 5 % with KCl 20 mEq / L 20 mEq (11/12/16 2141)  . piperacillin-tazobactam (ZOSYN)  IV Stopped (11/13/16 0215)  . vancomycin     PRN Meds:.acetaminophen **OR** acetaminophen, bisacodyl, ondansetron **OR** ondansetron (ZOFRAN) IV No Known Allergies Review of Systems patient non-verbal  Physical Exam  Frail, elderly, blind female, cachectically thin CV rrr with murmur Resp no distress Abdomen concave, non tender Extremities no edema evident  Vital Signs: BP 118/60 (BP Location: Right Arm)   Pulse 70   Temp 97.7 F (36.5 C) (Oral)   Resp 16   Ht 5' 2" (1.575 m)   Wt 40.7 kg (89 lb 12.8 oz)   SpO2 94%   BMI 16.42 kg/m  Pain Assessment: No/denies pain       SpO2: SpO2: 94 % O2 Device:SpO2: 94 % O2 Flow Rate: .O2 Flow Rate (L/min): 5 L/min  IO: Intake/output summary:  Intake/Output Summary (Last 24 hours) at 11/13/16 0955 Last data filed at 11/13/16 0637  Gross per 24 hour  Intake           3482.5 ml  Output                0 ml  Net           3482.5 ml    LBM:   Baseline Weight: Weight: 40.8 kg (90 lb) Most recent weight: Weight: 40.7 kg (89 lb 12.8 oz)     Palliative Assessment/Data:     Time In: 9:35 Time Out: 10 12 Time Total: 30 min. Greater than 50%  of this time was spent counseling and coordinating care related to the above assessment and plan.  Signed by:  , PA-C Palliative Medicine Pager: [phone]  Please contact Palliative Medicine Team phone at [phone] for questions and concerns.  For individual provider: See Amion                

## 2016-11-13 NOTE — Consult Note (Signed)
WOC Nurse wound consult note Reason for Consult: Ongoing stage 3 pressure injury.  Last year,was assessed and is improved during this hospitalization.  Albumin is 3.0, which is improved also.   Wound type:Stage 3 pressure injury Pressure Injury POA: Yes Measurement: Sacrum, left aspect 3 cm x 1.4 cm x 0.3 cm Right aspect 1 cm x 0.5 cm x 0.1 cm scarring present circumferentially where areas have healed. Bony prominence evident.  Wound BMB:OMQT and moist Drainage (amount, consistency, odor) Scant serosanguinous  No odor.  Periwound:scarring Dressing procedure/placement/frequency:Cleanse sacral wound with NS and pat gently dry.  Apply calcium alginate to wound bed to keep moist and promote healing.  Cover with silicone border foam.  WIll order mattress with low air loss feature.  Will not follow at this time.  Please re-consult if needed.  Maple Hudson RN BSN [facility]ON Pager [phone]

## 2016-11-14 ENCOUNTER — Inpatient Hospital Stay: Payer: Medicare Other

## 2016-11-14 ENCOUNTER — Inpatient Hospital Stay
Admit: 2016-11-14 | Discharge: 2016-11-14 | Disposition: A | Discharge status: Home / Self Care | Payer: Medicare Other | Attending: Internal Medicine | Admitting: Internal Medicine

## 2016-11-14 LAB — SODIUM
Sodium: 154 mmol/L — ABNORMAL HIGH (ref 135–145)
Sodium: 151 mmol/L — ABNORMAL HIGH (ref 135–145)
Sodium: 148 mmol/L — ABNORMAL HIGH (ref 135–145)

## 2016-11-14 LAB — CBC
HEMATOCRIT: 37.7 % (ref 35.0–47.0)
HEMOGLOBIN: 11.8 g/dL — AB (ref 12.0–16.0)
MCH: 28.5 pg (ref 26.0–34.0)
MCHC: 31.3 g/dL — ABNORMAL LOW (ref 32.0–36.0)
MCV: 90.8 fL (ref 80.0–100.0)
Platelets: 156 10*3/uL (ref 150–440)
RBC: 4.15 MIL/uL (ref 3.80–5.20)
RDW: 16.8 % — ABNORMAL HIGH (ref 11.5–14.5)
WBC: 39.1 10*3/uL — AB (ref 3.6–11.0)

## 2016-11-14 LAB — BASIC METABOLIC PANEL
ANION GAP: 8 (ref 5–15)
BUN: 34 mg/dL — ABNORMAL HIGH (ref 6–20)
CALCIUM: 9.1 mg/dL (ref 8.9–10.3)
CHLORIDE: 126 mmol/L — AB (ref 101–111)
CO2: 16 mmol/L — AB (ref 22–32)
Creatinine, Ser: 1.18 mg/dL — ABNORMAL HIGH (ref 0.44–1.00)
GFR, EST AFRICAN AMERICAN: 45 mL/min — AB (ref >60)
GFR, EST NON AFRICAN AMERICAN: 39 mL/min — AB (ref >60)
Glucose, Bld: 168 mg/dL — ABNORMAL HIGH (ref 65–99)
Potassium: 4.2 mmol/L (ref 3.5–5.1)
Sodium: 150 mmol/L — ABNORMAL HIGH (ref 135–145)

## 2016-11-14 LAB — GLUCOSE, CAPILLARY
GLUCOSE-CAPILLARY: 165 mg/dL — AB (ref 65–99)
GLUCOSE-CAPILLARY: 156 mg/dL — AB (ref 65–99)
GLUCOSE-CAPILLARY: 188 mg/dL — AB (ref 65–99)
GLUCOSE-CAPILLARY: 147 mg/dL — AB (ref 65–99)
Glucose-Capillary: 167 mg/dL — ABNORMAL HIGH (ref 65–99)
Glucose-Capillary: 175 mg/dL — ABNORMAL HIGH (ref 65–99)

## 2016-11-14 MED ORDER — DEXTROSE 5 % IV SOLN
INTRAVENOUS | Status: DC
  Administered 2016-11-14 – 2016-11-15 (×3): via INTRAVENOUS

## 2016-11-14 NOTE — Progress Notes (Signed)
*  PRELIMINARY RESULTS* Echocardiogram 2D Echocardiogram has been performed.  Jasmine Briggs 11/14/2016, 9:51 AM

## 2016-11-14 NOTE — Progress Notes (Signed)
[facility] [place] [facility]   PATIENT NAME: Jasmine Briggs    MR#:  [id number]  DATE OF BIRTH:  September 29, 1920  SUBJECTIVE:  CHIEF COMPLAINT:   Chief Complaint  Patient presents with  . Loss of Consciousness  . Code Sepsis   - Patient admitted with sepsis, remains lethargic. -Positive blood cultures. Sodium remains elevated.  REVIEW OF SYSTEMS:  Review of Systems  Unable to perform ROS: Patient unresponsive    DRUG ALLERGIES:  No Known Allergies  VITALS:  Blood pressure 132/63, pulse 68, temperature 97.9 F (36.6 C), temperature source Axillary, resp. rate (!) 22, height 5\' 2"  (1.575 m), weight 42.3 kg (93 lb 4.8 oz), SpO2 97 %.  PHYSICAL EXAMINATION:  Physical Exam  GENERAL:  [name] lying in the bed with no acute distress.  EYES: Pupils equal, round, reactive to light and accommodation. No scleral icterus. Extraocular muscles intact.  HEENT: Head atraumatic, normocephalic. Oropharynx and nasopharynx clear. Temporal wasting is noted NECK:  Supple, no jugular venous distention. No thyroid enlargement, no tenderness.  LUNGS: Normal breath sounds bilaterally, no wheezing, rales or crepitation. No use of accessory muscles of respiration. Decreased bibasilar breath sounds. Upper airway rhonchi noted CARDIOVASCULAR: S1, S2 normal. No murmurs, rubs, or gallops. Right chest pacemaker in place without any swelling or erythema or tenderness. ABDOMEN: Soft, nontender, nondistended. Bowel sounds present. No organomegaly or mass.  EXTREMITIES: No pedal edema, cyanosis, or clubbing.  NEUROLOGIC: Drowsy on exam, not following commands..  PSYCHIATRIC: The patient is lethargic  SKIN: Has a stage III sacral decubitus ulcer, present on admission   LABORATORY PANEL:   CBC  Recent Labs Lab 11/14/16 0317  WBC 39.1*  HGB 11.8*  HCT 37.7  PLT 156    ------------------------------------------------------------------------------------------------------------------  Chemistries   Recent Labs Lab 11/12/16 1008  11/14/16 0957  NA 161*  < > 150*  K 4.0  < > 4.2  CL >130*  < > 126*  CO2 15*  < > 16*  GLUCOSE 197*  < > 168*  BUN 38*  < > 34*  CREATININE 1.77*  < > 1.18*  CALCIUM 9.7  < > 9.1  AST 27  --   --   ALT 11*  --   --   ALKPHOS 42  --   --   BILITOT 1.3*  --   --   < > = values in this interval not displayed. ------------------------------------------------------------------------------------------------------------------  Cardiac Enzymes  Recent Labs Lab 11/12/16 1008  TROPONINI 0.34*   ------------------------------------------------------------------------------------------------------------------  RADIOLOGY:  Dg Chest Port 1 View  Result Date: 11/12/2016 CLINICAL DATA:  Hypoxia EXAM: PORTABLE CHEST 1 VIEW COMPARISON:  07/10/2015 FINDINGS: Pacing device is again seen. Cardiac enlargement is again noted. The lungs are well aerated bilaterally. No focal infiltrate or sizable effusion is seen. No bony abnormality is noted. Postsurgical changes again seen. IMPRESSION: No acute abnormality noted. Electronically Signed   By: Alcide Clever M.D.   On: 11/12/2016 11:09    EKG:   Orders placed or performed during the hospital encounter of 11/12/16  . EKG 12-Lead  . EKG 12-Lead  . EKG 12-Lead  . EKG 12-Lead    ASSESSMENT AND PLAN:   83 year old female with known history of dementia, bedbound status, arthritis, diabetes, hypertension and a sacral wound with osteomyelitis presents to hospital secondary to poor oral intake and decreased responsiveness.  #1 severe hypernatremia and hyperchloremia-secondary to free water deficit. -Continue D5 water at this time. Monitor  sodium. Slight improvement noted.  #2 acute metabolic encephalopathy-worse than baseline. At baseline she is able to sit and converse with  family. -Secondary to her hypernatremia and sepsis -No significant improvement noted so far.  #3 sepsis-secondary to gram-positive cocci bacteremia, B CID with possible MRSA. -Continue vancomycin and Zosyn for now due to ongoing fevers. -WBC still significantly elevated. Appreciate ID consult. -Echocardiogram ordered to rule out endocarditis. However patient has a pacemaker though it does not look infected, if fevers persist and WBC still elevated, consider EP consult for removal of pacemaker.  #4 severe protein calorie malnutrition-with significant temporal wasting noted. Poor prognostic sign. Patient is not alert enough to pass speech eval at this time. Continue fluids  #5 acute bronchitis-currently on antibiotics. Unable to clear secretions. Follow-up chest x-ray  #6 acute renal failure on CK D stage III-monitor while on IV fluids  #7 DVT prophylaxis heparin subcutaneous heparin  Palliative care consult is appreciated. Agree with poor prognosis. Family wanted to wait over the weekend to see how she responds.   All the records are reviewed and case discussed with Care Management/Social Workerr. Management plans discussed with the patient, family and they are in agreement.  CODE STATUS: DNR  TOTAL TIME TAKING CARE OF THIS PATIENT: 37 minutes.   POSSIBLE D/C IN 2-3 DAYS, DEPENDING ON CLINICAL CONDITION.   Gasper Hopes M.D on 11/14/2016 at 10:49 AM  Between 7am to 6pm - Pager - [phone]  After 6pm go to [email or web address] - Social research officer, government  [facility]  Office  [phone]  CC: Primary care physician; Kurtis Bushman, MD

## 2016-11-14 NOTE — Evaluation (Signed)
Clinical/Bedside Swallow Evaluation Patient Details  Name: Jasmine Briggs MRN: [id number] Date of Birth: 03/23/1921  Today's Date: 11/14/2016 Time: SLP Start Time (ACUTE ONLY): 0950 SLP Stop Time (ACUTE ONLY): 1020 SLP Time Calculation (min) (ACUTE ONLY): 30 min  Past Medical History:  Past Medical History:  Diagnosis Date  . Arthritis   . Dementia   . Diabetes mellitus without complication (HCC)   . Gout   . Hypertension   . Osteomyelitis (HCC)    sacral wound   Past Surgical History:  Past Surgical History:  Procedure Laterality Date  . CARDIAC SURGERY    . PACEMAKER INSERTION    . PICC line placement     HPI:  83 y.o. female  with past medical history of sacral osteomyelitis, dementia, pacemaker placement, CHF, and gout who was admitted on 11/12/2016 with shortness of breath x 3 days. Work up indicated hypoxic respiratory failure, and sepsis with a temperature of 104.6. Blood cultures are positive for MRSA. ST attempted evaluation yesterday, however pt was too lethargic. Pt a little more arousable today.   Assessment / Plan / Recommendation Clinical Impression  This 83 y/o female presents with moderate-severe oropharyngeal dysphagia and severe risk of aspiration. Pt is a little more arousable today and was attempting to speak to ST when spoken to. Pt also able to open eyes upon command. Pt is on Dysphagia I diet w/nectar thick liquids at baseline and needs total assist w/PO intake d/t she is legally blind. Pt given 2 tsps of puree. Pt demonstrated prolonged oral transit and then suspected delayed swallow initiation of at least 30 seconds. Oral care performed after PO intake, no significant pocketing noted. No overt s/s of aspiration were observed immediately following swallow, however pt is at severe risk d/t oropharyngeal deficits and lethargy.  No further PO trials given d/t pt's risk of aspiration. Unable to assess oral motor abilities d/t pt unable to follow commands. Recommend  continue with NPO at this time d/t high aspiration risk. Will f/u in 1-2 days with further trials and will recommend diet as appropriate.  Nsg in agreement.  SLP Visit Diagnosis: Dysphagia, oropharyngeal phase (R13.12)    Aspiration Risk  Severe aspiration risk    Diet Recommendation NPO   Supervision: Staff to assist with self feeding;Full supervision/cueing for compensatory strategies    Other  Recommendations Oral Care Recommendations: Oral care QID;Staff/trained caregiver to provide oral care   Follow up Recommendations  (TBD)      Frequency and Duration min 3x week  2 weeks       Prognosis Prognosis for Safe Diet Advancement: Guarded Barriers to Reach Goals: Cognitive deficits;Severity of deficits      Swallow Study   General Date of Onset: 11/12/16 HPI: 83 y.o. female  with past medical history of sacral osteomyelitis, dementia, pacemaker placement, CHF, and gout who was admitted on 11/12/2016 with shortness of breath x 3 days. Work up indicated hypoxic respiratory failure, and sepsis with a temperature of 104.6. Blood cultures are positive for MRSA. ST attempted evaluation yesterday, however pt was too lethargic. Pt a little more arousable today. Type of Study: Bedside Swallow Evaluation Previous Swallow Assessment: Pt is on Dysphagia I diet w/nectar thick liquids at baseline.  Diet Prior to this Study: Dysphagia 1 (puree);Nectar-thick liquids Temperature Spikes Noted: No Respiratory Status: Nasal cannula History of Recent Intubation: No Behavior/Cognition: Lethargic/Drowsy;Requires cueing;Doesn't follow directions Oral Cavity Assessment: Within Functional Limits Oral Care Completed by SLP: Recent completion by staff Oral Cavity - Dentition:  Poor condition;Missing dentition Vision: Impaired for self-feeding (Pt is legally blind) Self-Feeding Abilities: Total assist Patient Positioning: Upright in bed Baseline Vocal Quality: Normal Volitional Cough: Strong Volitional  Swallow: Unable to elicit    Oral/Motor/Sensory Function Overall Oral Motor/Sensory Function: Other (comment) (Unable to assess d/t pt unable to follow commands)   Ice Chips Ice chips: Not tested   Thin Liquid Thin Liquid: Not tested    Nectar Thick Nectar Thick Liquid: Not tested   Honey Thick Honey Thick Liquid: Not tested   Puree Puree: Impaired Presentation: Spoon Oral Phase Impairments: Reduced labial seal Oral Phase Functional Implications: Prolonged oral transit Pharyngeal Phase Impairments: Suspected delayed Swallow   Solid   GO   Solid: Not tested        Jasmine Briggs,Jasmine Briggs 11/14/2016,10:21 AM

## 2016-11-15 LAB — ECHOCARDIOGRAM COMPLETE
Height: 62 in
Weight: 1492.8 oz

## 2016-11-15 LAB — GLUCOSE, CAPILLARY
GLUCOSE-CAPILLARY: 102 mg/dL — AB (ref 65–99)
GLUCOSE-CAPILLARY: 131 mg/dL — AB (ref 65–99)
Glucose-Capillary: 126 mg/dL — ABNORMAL HIGH (ref 65–99)
Glucose-Capillary: 153 mg/dL — ABNORMAL HIGH (ref 65–99)

## 2016-11-15 LAB — BASIC METABOLIC PANEL
ANION GAP: 7 (ref 5–15)
BUN: 27 mg/dL — AB (ref 6–20)
CALCIUM: 8.9 mg/dL (ref 8.9–10.3)
CO2: 16 mmol/L — AB (ref 22–32)
CREATININE: 1.05 mg/dL — AB (ref 0.44–1.00)
Chloride: 124 mmol/L — ABNORMAL HIGH (ref 101–111)
GFR calc Af Amer: 51 mL/min — ABNORMAL LOW (ref >60)
GFR, EST NON AFRICAN AMERICAN: 44 mL/min — AB (ref >60)
GLUCOSE: 162 mg/dL — AB (ref 65–99)
Potassium: 3.6 mmol/L (ref 3.5–5.1)
Sodium: 147 mmol/L — ABNORMAL HIGH (ref 135–145)

## 2016-11-15 LAB — CBC
HCT: 32.3 % — ABNORMAL LOW (ref 35.0–47.0)
Hemoglobin: 10.5 g/dL — ABNORMAL LOW (ref 12.0–16.0)
MCH: 28.2 pg (ref 26.0–34.0)
MCHC: 32.5 g/dL (ref 32.0–36.0)
MCV: 87 fL (ref 80.0–100.0)
PLATELETS: 143 10*3/uL — AB (ref 150–440)
RBC: 3.71 MIL/uL — ABNORMAL LOW (ref 3.80–5.20)
RDW: 16.6 % — AB (ref 11.5–14.5)
WBC: 18.9 10*3/uL — AB (ref 3.6–11.0)

## 2016-11-15 LAB — SODIUM
Sodium: 147 mmol/L — ABNORMAL HIGH (ref 135–145)
Sodium: 146 mmol/L — ABNORMAL HIGH (ref 135–145)
Sodium: 145 mmol/L (ref 135–145)

## 2016-11-15 MED ORDER — DEXTROSE-NACL 5-0.45 % IV SOLN
INTRAVENOUS | Status: DC
  Administered 2016-11-15 – 2016-11-16 (×3): via INTRAVENOUS

## 2016-11-15 MED ORDER — PIPERACILLIN-TAZOBACTAM 3.375 G IVPB
3.3750 g | Freq: Three times a day (TID) | INTRAVENOUS | Status: DC
  Administered 2016-11-15 – 2016-11-16 (×2): 3.375 g via INTRAVENOUS
  Filled 2016-11-15 (×2): qty 50

## 2016-11-15 NOTE — Progress Notes (Signed)
[facility] [place] at [facility]   PATIENT NAME: Jasmine Briggs    MR#:  [id number]  DATE OF BIRTH:  05/26/1920  SUBJECTIVE:  CHIEF COMPLAINT:   Chief Complaint  Patient presents with  . Loss of Consciousness  . Code Sepsis   - Breathing treatment, is closed. Just moaning with tactile stimulation. Not answering any questions  REVIEW OF SYSTEMS:  Review of Systems  Unable to perform ROS: Patient unresponsive    DRUG ALLERGIES:  No Known Allergies  VITALS:  Blood pressure 133/73, pulse 75, temperature 98 F (36.7 C), resp. rate 17, height 5\' 2"  (1.575 m), weight 42.4 kg (93 lb 7 oz), SpO2 94 %.  PHYSICAL EXAMINATION:  Physical Exam  GENERAL:  [name] lying in the bed with no acute distress.  EYES: Pupils equal, round, reactive to light and accommodation. No scleral icterus. Extraocular muscles intact.  HEENT: Head atraumatic, normocephalic. Oropharynx and nasopharynx clear. Temporal wasting is noted NECK:  Supple, no jugular venous distention. No thyroid enlargement, no tenderness.  LUNGS: Normal breath sounds bilaterally, no wheezing, rales or crepitation. No use of accessory muscles of respiration. Decreased bibasilar breath sounds. Upper airway rhonchi noted CARDIOVASCULAR: S1, S2 normal. No murmurs, rubs, or gallops. Right chest pacemaker in place without any swelling or erythema or tenderness. ABDOMEN: Soft, nontender, nondistended. Bowel sounds present. No organomegaly or mass.  EXTREMITIES: No pedal edema, cyanosis, or clubbing.  NEUROLOGIC: Drowsy on exam, not following commands..  PSYCHIATRIC: The patient is lethargic  SKIN: Has a stage III sacral decubitus ulcer, present on admission   LABORATORY PANEL:   CBC  Recent Labs Lab 11/15/16 0335  WBC 18.9*  HGB 10.5*  HCT 32.3*  PLT 143*    ------------------------------------------------------------------------------------------------------------------  Chemistries   Recent Labs Lab 11/12/16 1008  11/15/16 0335  NA 161*  < > 147*  K 4.0  < > 3.6  CL >130*  < > 124*  CO2 15*  < > 16*  GLUCOSE 197*  < > 162*  BUN 38*  < > 27*  CREATININE 1.77*  < > 1.05*  CALCIUM 9.7  < > 8.9  AST 27  --   --   ALT 11*  --   --   ALKPHOS 42  --   --   BILITOT 1.3*  --   --   < > = values in this interval not displayed. ------------------------------------------------------------------------------------------------------------------  Cardiac Enzymes  Recent Labs Lab 11/12/16 1008  TROPONINI 0.34*   ------------------------------------------------------------------------------------------------------------------  RADIOLOGY:  Dg Chest Port 1 View  Result Date: 11/14/2016 CLINICAL DATA:  Sepsis EXAM: PORTABLE CHEST - 1 VIEW COMPARISON:  11/12/2016 FINDINGS: Right subclavian transvenous pacemaker in disconnected left subclavian transvenous lead are stable in position. Previous median sternotomy. Interval development of moderate airspace opacities throughout the right lung with volume loss. Blunting of the right lateral costophrenic angle suggesting effusion. Heart size and mediastinal contours are within normal limits. No pneumothorax. Chronic dislocation of the right humeral head. IMPRESSION: 1. New diffuse right lung airspace disease with effusion. Electronically Signed   By: Corlis Leak M.D.   On: 11/14/2016 11:06    EKG:   Orders placed or performed during the hospital encounter of 11/12/16  . EKG 12-Lead  . EKG 12-Lead  . EKG 12-Lead  . EKG 12-Lead    ASSESSMENT AND PLAN:   83 year old female with known history of dementia, bedbound status, arthritis, diabetes, hypertension and a sacral wound with osteomyelitis presents  to hospital secondary to poor oral intake and decreased responsiveness.  #1 severe hypernatremia  and hyperchloremia-secondary to free water deficit. -on D5 water at this time. Monitor sodium. improvement noted. Change to D5 1/2NS  #2 acute metabolic encephalopathy-worse than baseline. At baseline she is able to sit and converse with family. -Secondary to her hypernatremia and sepsis -No significant improvement noted so far. But have been notified that she occasionally answers to family  #3 sepsis-secondary to gram-positive cocci bacteremia- ?  coagulase negative staph, BCID with possible MRSA. -Continue vancomycin and Zosyn for now as WBC is still elevated -WBC significantly elevated. Appreciate ID consult. -Echocardiogram negative for endocarditis. However patient has a pacemaker though it does not look infected, if fevers persist and WBC still elevated, consider EP consult for removal of pacemaker.  #4 severe protein calorie malnutrition-with significant temporal wasting noted. Poor prognostic sign. Patient is not alert enough to pass speech eval at this time. Continue fluids  #5 acute bronchitis-currently on antibiotics. Unable to clear secretions. Follow-up chest x-ray with an infiltrate consistent with pneumonia  #6 acute renal failure on CK D stage III-monitor while on IV fluids  #7 DVT prophylaxis heparin subcutaneous heparin  Palliative care consult is appreciated. Agree with poor prognosis. Family wanted to wait over the weekend to see how she responds.   All the records are reviewed and case discussed with Care Management/Social Workerr. Management plans discussed with the patient, family and they are in agreement.  CODE STATUS: DNR  TOTAL TIME TAKING CARE OF THIS PATIENT: 37 minutes.   POSSIBLE D/C IN 1-2 DAYS, DEPENDING ON CLINICAL CONDITION.   Kieon Lawhorn M.D on 11/15/2016 at 8:37 AM  Between 7am to 6pm - Pager - [phone]  After 6pm go to [email or web address] - Social research officer, government  [facility]  Office  [phone]  CC: Primary care  physician; Kurtis Bushman, MD

## 2016-11-15 NOTE — Progress Notes (Signed)
PHARMACY - PHYSICIAN COMMUNICATION CRITICAL VALUE ALERT - BLOOD CULTURE IDENTIFICATION (BCID)  Results for orders placed or performed during the hospital encounter of 11/12/16  Blood Culture ID Panel (Reflexed) (Collected: 11/12/2016 10:08 AM)  Result Value Ref Range   Enterococcus species NOT DETECTED NOT DETECTED   Listeria monocytogenes NOT DETECTED NOT DETECTED   Staphylococcus species DETECTED (A) NOT DETECTED   Staphylococcus aureus NOT DETECTED NOT DETECTED   Methicillin resistance DETECTED (A) NOT DETECTED   Streptococcus species NOT DETECTED NOT DETECTED   Streptococcus agalactiae NOT DETECTED NOT DETECTED   Streptococcus pneumoniae NOT DETECTED NOT DETECTED   Streptococcus pyogenes NOT DETECTED NOT DETECTED   Acinetobacter baumannii NOT DETECTED NOT DETECTED   Enterobacteriaceae species NOT DETECTED NOT DETECTED   Enterobacter cloacae complex NOT DETECTED NOT DETECTED   Escherichia coli NOT DETECTED NOT DETECTED   Klebsiella oxytoca NOT DETECTED NOT DETECTED   Klebsiella pneumoniae NOT DETECTED NOT DETECTED   Proteus species NOT DETECTED NOT DETECTED   Serratia marcescens NOT DETECTED NOT DETECTED   Haemophilus influenzae NOT DETECTED NOT DETECTED   Neisseria meningitidis NOT DETECTED NOT DETECTED   Pseudomonas aeruginosa NOT DETECTED NOT DETECTED   Candida albicans NOT DETECTED NOT DETECTED   Candida glabrata NOT DETECTED NOT DETECTED   Candida krusei NOT DETECTED NOT DETECTED   Candida parapsilosis NOT DETECTED NOT DETECTED   Candida tropicalis NOT DETECTED NOT DETECTED    Name of physician (or Provider) Contacted: Dr. Nemiah Commander  Changes to prescribed antibiotics required: None  Carola Frost, Pharm.D., BCPS Clinical Pharmacist 11/15/2016  11:19 AM

## 2016-11-15 NOTE — Progress Notes (Signed)
Results for LILLYEN, VANLUVEN (MRN [id number]) as of 11/15/2016 11:20  Ref. Range 11/14/2016 09:57 11/14/2016 15:20 11/14/2016 21:05 11/15/2016 03:35 11/15/2016 09:46  Sodium Latest Ref Range: 135 - 145 mmol/L 150 (H) 151 (H) 148 (H) 147 (H) 147 (H)    S Sodium is improving slowly with current management  Will sign off Please re consult as necessary

## 2016-11-15 NOTE — Progress Notes (Signed)
Bladder scanned pt. First scan was 55, second 69, third 150 and fourth 150, bladder is not distented and pt. Does not show any signs of discomfort when bladder is palpated. Will continue to monitor pt.

## 2016-11-15 NOTE — Progress Notes (Signed)
ANTIBIOTIC CONSULT NOTE - FOLLOW UP   Pharmacy Consult for Vancomycin and Zosyn Indication: sepsis  No Known Allergies  Patient Measurements: Height: 5\' 2"  (157.5 cm) Weight: 93 lb 7 oz (42.4 kg) IBW/kg (Calculated) : 50.1 Adjusted Body Weight:   Vital Signs: Temp: 97.8 F (36.6 C) (08/26 0900) Temp Source: Oral (08/26 0900) BP: 141/69 (08/26 0900) Pulse Rate: 79 (08/26 0900) Intake/Output from previous day: 08/25 0701 - 08/26 0700 In: 1546.3 [I.V.:1446.3; IV Piggyback:100] Out: 300 [Urine:300] Intake/Output from this shift: No intake/output data recorded.  Labs:  Recent Labs  11/13/16 0737  11/13/16 2112 11/14/16 0317 11/14/16 0957 11/15/16 0335  WBC 39.6*  --   --  39.1*  --  18.9*  HGB 9.9*  --   --  11.8*  --  10.5*  PLT 131*  --   --  156  --  143*  CREATININE 1.53*  < > 1.22*  --  1.18* 1.05*  < > = values in this interval not displayed. Estimated Creatinine Clearance: 22.4 mL/min (A) (by C-G formula based on SCr of 1.05 mg/dL (H)). No results for input(s): VANCOTROUGH, VANCOPEAK, VANCORANDOM, GENTTROUGH, GENTPEAK, GENTRANDOM, TOBRATROUGH, TOBRAPEAK, TOBRARND, AMIKACINPEAK, AMIKACINTROU, AMIKACIN in the last 72 hours.   Microbiology: Recent Results (from the past 720 hour(s))  Blood Culture (routine x 2)     Status: Abnormal (Preliminary result)   Collection Time: 11/12/16 10:08 AM  Result Value Ref Range Status   Specimen Description BLOOD RIGHT HAND  Final   Special Requests   Final    BOTTLES DRAWN AEROBIC AND ANAEROBIC Blood Culture adequate volume   Culture  Setup Time   Final    GRAM POSITIVE COCCI IN BOTH AEROBIC AND ANAEROBIC BOTTLES CRITICAL VALUE NOTED.  VALUE IS CONSISTENT WITH PREVIOUSLY REPORTED AND CALLED VALUE.    Culture (A)  Final    STAPHYLOCOCCUS SPECIES (COAGULASE NEGATIVE) CULTURE REINCUBATED FOR BETTER GROWTH Performed at [facility], [address], [place], [place] [address]    Report Status PENDING  Incomplete  Blood  Culture (routine x 2)     Status: Abnormal (Preliminary result)   Collection Time: 11/12/16 10:08 AM  Result Value Ref Range Status   Specimen Description BLOOD LEFT HAND  Final   Special Requests   Final    BOTTLES DRAWN AEROBIC AND ANAEROBIC Blood Culture adequate volume   Culture  Setup Time   Final    GRAM POSITIVE COCCI IN BOTH AEROBIC AND ANAEROBIC BOTTLES CRITICAL RESULT CALLED TO, READ BACK BY AND VERIFIED WITH: MATT MCBANE @ 0553 ON 11/13/2016 BY CAF    Culture (A)  Final    STAPHYLOCOCCUS SPECIES (COAGULASE NEGATIVE) CULTURE REINCUBATED FOR BETTER GROWTH Performed at [facility], [address], [place], [place] [address]    Report Status PENDING  Incomplete  Urine culture     Status: None   Collection Time: 11/12/16 10:08 AM  Result Value Ref Range Status   Specimen Description URINE, RANDOM  Final   Special Requests NONE  Final   Culture   Final    NO GROWTH Performed at [facility], [address], [place], [place] [address]    Report Status 11/13/2016 FINAL  Final  Blood Culture ID Panel (Reflexed)     Status: Abnormal   Collection Time: 11/12/16 10:08 AM  Result Value Ref Range Status   Enterococcus species NOT DETECTED NOT DETECTED Final   Listeria monocytogenes NOT DETECTED NOT DETECTED Final   Staphylococcus  species DETECTED (A) NOT DETECTED Final    Comment: Methicillin (oxacillin) resistant coagulase negative staphylococcus. Possible blood culture contaminant (unless isolated from more than one blood culture draw or clinical case suggests pathogenicity). No antibiotic treatment is indicated for blood  culture contaminants. CRITICAL RESULT CALLED TO, READ BACK BY AND VERIFIED WITH: MATT MCBANE @ 0553 ON 11/13/2016 BY CAF    Staphylococcus aureus NOT DETECTED NOT DETECTED Final   Methicillin resistance DETECTED (A) NOT DETECTED Final    Comment: CRITICAL RESULT CALLED TO, READ BACK BY AND VERIFIED WITH: MATT MCBANE @ 0553 ON 11/13/2016 BY CAF     Streptococcus species NOT DETECTED NOT DETECTED Final   Streptococcus agalactiae NOT DETECTED NOT DETECTED Final   Streptococcus pneumoniae NOT DETECTED NOT DETECTED Final   Streptococcus pyogenes NOT DETECTED NOT DETECTED Final   Acinetobacter baumannii NOT DETECTED NOT DETECTED Final   Enterobacteriaceae species NOT DETECTED NOT DETECTED Final   Enterobacter cloacae complex NOT DETECTED NOT DETECTED Final   Escherichia coli NOT DETECTED NOT DETECTED Final   Klebsiella oxytoca NOT DETECTED NOT DETECTED Final   Klebsiella pneumoniae NOT DETECTED NOT DETECTED Final   Proteus species NOT DETECTED NOT DETECTED Final   Serratia marcescens NOT DETECTED NOT DETECTED Final   Haemophilus influenzae NOT DETECTED NOT DETECTED Final   Neisseria meningitidis NOT DETECTED NOT DETECTED Final   Pseudomonas aeruginosa NOT DETECTED NOT DETECTED Final   Candida albicans NOT DETECTED NOT DETECTED Final   Candida glabrata NOT DETECTED NOT DETECTED Final   Candida krusei NOT DETECTED NOT DETECTED Final   Candida parapsilosis NOT DETECTED NOT DETECTED Final   Candida tropicalis NOT DETECTED NOT DETECTED Final  Culture, blood (Routine X 2) w Reflex to ID Panel     Status: None (Preliminary result)   Collection Time: 11/13/16  9:00 PM  Result Value Ref Range Status   Specimen Description BLOOD LEFT HAND  Final   Special Requests   Final    BOTTLES DRAWN AEROBIC AND ANAEROBIC Blood Culture adequate volume   Culture  Setup Time   Final    GRAM POSITIVE COCCI ANAEROBIC BOTTLE ONLY CRITICAL RESULT CALLED TO, READ BACK BY AND VERIFIED WITH: STEPHANIE SHUDER ON 11/15/16 AT 1111 [facility]    Culture   Final    GRAM POSITIVE COCCI CULTURE REINCUBATED FOR BETTER GROWTH Performed at [facility], [address], [place], [place] [address]    Report Status PENDING  Incomplete  Culture, blood (Routine X 2) w Reflex to ID Panel     Status: None (Preliminary result)   Collection Time: 11/13/16  9:12 PM  Result  Value Ref Range Status   Specimen Description BLOOD LEFT ARM  Final   Special Requests   Final    BOTTLES DRAWN AEROBIC AND ANAEROBIC Blood Culture adequate volume   Culture NO GROWTH 2 DAYS  Final   Report Status PENDING  Incomplete    Medical History: Past Medical History:  Diagnosis Date  . Arthritis   . Dementia   . Diabetes mellitus without complication (HCC)   . Gout   . Hypertension   . Osteomyelitis (HCC)    sacral wound    Medications:  Prescriptions Prior to Admission  Medication Sig Dispense Refill Last Dose  . acetaminophen (TYLENOL) 500 MG tablet Take 500 mg by mouth every 6 (six) hours as needed for mild pain.    PRN at PRN  . torsemide (DEMADEX) 20 MG tablet Take 1 tablet (20 mg total) by  mouth 2 (two) times daily. 60 tablet 5 11/11/2016 at 2000  . nicotine (NICODERM CQ - DOSED IN MG/24 HR) 7 mg/24hr patch Place 1 patch (7 mg total) onto the skin daily. (Patient not taking: Reported on 11/12/2016) 28 patch 0 Not Taking at Unknown time  . ondansetron (ZOFRAN) 4 MG tablet Take 1 tablet (4 mg total) by mouth every 6 (six) hours as needed for nausea. (Patient not taking: Reported on 11/12/2016) 20 tablet 0 Not Taking at Unknown time  . potassium chloride 20 MEQ/15ML (10%) SOLN Take 15 mLs (20 mEq total) by mouth daily. (Patient not taking: Reported on 11/12/2016) 450 mL 0 Not Taking at Unknown time   Scheduled:  . chlorhexidine  15 mL Mouth Rinse BID  . docusate sodium  100 mg Oral BID  . heparin  5,000 Units Subcutaneous Q8H  . insulin aspart  0-9 Units Subcutaneous Q6H  . ipratropium-albuterol  3 mL Nebulization Q6H  . mouth rinse  15 mL Mouth Rinse q12n4p   Assessment: Pharmacy consulted to dose and monitor Vancomycin and Zosyn in this critically ill elderly woman.   Goal of Therapy:  Vancomycin Trough level 15-20   Plan:  Vancomycin 500 mg IV q36 hours  Will change Zosyn to 3.375 g IV q8 hours.  Glendene Wyer D 11/15/2016,3:13 PM

## 2016-11-15 NOTE — Clinical Social Work Note (Signed)
CSW is continuing to watch for needs. At this time, the family wants to see how the patient progresses over the weekend. CSW is following and available.  Argentina Ponder, MSW, Theresia Majors [phone]

## 2016-11-15 NOTE — Progress Notes (Signed)
Pt. Slept well throughout the night waking during Q2 turn and dressing change. Pt. Became more verbal as the night progress, especially when turning her mumbling no. Son stayed at bedside throughout the night.

## 2016-11-16 DIAGNOSIS — G9340 Encephalopathy, unspecified: Secondary | ICD-10-CM

## 2016-11-16 LAB — CBC
HEMATOCRIT: 31.2 % — AB (ref 35.0–47.0)
HEMOGLOBIN: 10.1 g/dL — AB (ref 12.0–16.0)
MCH: 28.3 pg (ref 26.0–34.0)
MCHC: 32.3 g/dL (ref 32.0–36.0)
MCV: 87.8 fL (ref 80.0–100.0)
Platelets: 128 10*3/uL — ABNORMAL LOW (ref 150–440)
RBC: 3.55 MIL/uL — AB (ref 3.80–5.20)
RDW: 15.9 % — ABNORMAL HIGH (ref 11.5–14.5)
WBC: 11.7 10*3/uL — ABNORMAL HIGH (ref 3.6–11.0)

## 2016-11-16 LAB — GLUCOSE, CAPILLARY
GLUCOSE-CAPILLARY: 120 mg/dL — AB (ref 65–99)
GLUCOSE-CAPILLARY: 139 mg/dL — AB (ref 65–99)
GLUCOSE-CAPILLARY: 133 mg/dL — AB (ref 65–99)
Glucose-Capillary: 120 mg/dL — ABNORMAL HIGH (ref 65–99)

## 2016-11-16 LAB — CULTURE, BLOOD (ROUTINE X 2)
SPECIAL REQUESTS: ADEQUATE
SPECIAL REQUESTS: ADEQUATE
Special Requests: ADEQUATE

## 2016-11-16 LAB — BASIC METABOLIC PANEL
Anion gap: 7 (ref 5–15)
BUN: 17 mg/dL (ref 6–20)
CHLORIDE: 119 mmol/L — AB (ref 101–111)
CO2: 18 mmol/L — AB (ref 22–32)
Calcium: 8.3 mg/dL — ABNORMAL LOW (ref 8.9–10.3)
Creatinine, Ser: 0.61 mg/dL (ref 0.44–1.00)
GFR calc non Af Amer: >60 mL/min (ref >60)
Glucose, Bld: 126 mg/dL — ABNORMAL HIGH (ref 65–99)
POTASSIUM: 3 mmol/L — AB (ref 3.5–5.1)
SODIUM: 144 mmol/L (ref 135–145)

## 2016-11-16 LAB — VANCOMYCIN, TROUGH: Vancomycin Tr: 11 ug/mL — ABNORMAL LOW (ref 15–20)

## 2016-11-16 LAB — SODIUM
SODIUM: 142 mmol/L (ref 135–145)
Sodium: 142 mmol/L (ref 135–145)
Sodium: 141 mmol/L (ref 135–145)

## 2016-11-16 LAB — MAGNESIUM: Magnesium: 1.8 mg/dL (ref 1.7–2.4)

## 2016-11-16 MED ORDER — SODIUM CHLORIDE 0.9 % IV SOLN
500.0000 mg | INTRAVENOUS | Status: DC
  Administered 2016-11-17 – 2016-11-19 (×4): 500 mg via INTRAVENOUS
  Filled 2016-11-16 (×4): qty 500

## 2016-11-16 MED ORDER — POTASSIUM CHLORIDE 10 MEQ/100ML IV SOLN
10.0000 meq | INTRAVENOUS | Status: AC
  Administered 2016-11-16 (×4): 10 meq via INTRAVENOUS
  Filled 2016-11-16 (×4): qty 100

## 2016-11-16 NOTE — Care Management Important Message (Signed)
Important Message  Patient Details  Name: Jasmine Briggs MRN: [id number] Date of Birth: 07-31-1920   Medicare Important Message Given:  Yes    Collie Siad, RN 11/16/2016, 9:00 AM

## 2016-11-16 NOTE — Progress Notes (Signed)
Spoke with infection control. Staph growing in blood cultures not equivalent to MRSA, contact precautions may be removed. Order discontinued and yellow bag removed from patient's door. Family member in room educated.

## 2016-11-16 NOTE — Progress Notes (Signed)
Daily Progress Note   Patient Name: Jasmine Briggs       Date: 11/16/2016 DOB: 08-Nov-1920  Age: 83 y.o. MRN#: [id number] Attending Physician: Enid Baas, MD Primary Care Physician: Kurtis Bushman, MD Admit Date: 11/12/2016  Reason for Consultation/Follow-up: Establishing goals of care  Subjective: Patient in bed. Does not open eyes or respond verbally. Son, [name] is at bedside. He tells me that I need to speak with [name] re: decisions and GOC.  Per chart review- patient's blood cultures were not equivalent to MRSA, labs are improving- however, overall clinical picture remains poor with patient remaining non responsive, not eating or drinking.  Attempted to call [name] to discuss care- left message requesting return call.  Review of Systems  Unable to perform ROS: Mental status change    Length of Stay: 4  Current Medications: Scheduled Meds:  . chlorhexidine  15 mL Mouth Rinse BID  . docusate sodium  100 mg Oral BID  . heparin  5,000 Units Subcutaneous Q8H  . insulin aspart  0-9 Units Subcutaneous Q6H  . ipratropium-albuterol  3 mL Nebulization Q6H  . mouth rinse  15 mL Mouth Rinse q12n4p    Continuous Infusions: . dextrose 5 % and 0.45% NaCl 60 mL/hr at 11/16/16 0007  . vancomycin Stopped (11/15/16 1213)    PRN Meds: acetaminophen **OR** acetaminophen, bisacodyl, ondansetron **OR** ondansetron (ZOFRAN) IV  Physical Exam          Vital Signs: BP (!) 156/77 (BP Location: Left Arm)   Pulse 70   Temp (!) 97.5 F (36.4 C) (Axillary)   Resp 16   Ht 5\' 2"  (1.575 m)   Wt 43.6 kg (96 lb 1 oz)   SpO2 98%   BMI 17.57 kg/m  SpO2: SpO2: 98 % O2 Device: O2 Device: Nasal Cannula O2 Flow Rate: O2 Flow Rate (L/min): 2 L/min  Intake/output summary:  Intake/Output  Summary (Last 24 hours) at 11/16/16 1302 Last data filed at 11/16/16 0644  Gross per 24 hour  Intake             1668 ml  Output              550 ml  Net             1118 ml   LBM: Last BM Date: 11/16/16  Baseline Weight: Weight: 40.8 kg (90 lb) Most recent weight: Weight: 43.6 kg (96 lb 1 oz)       Palliative Assessment/Data: PPS: 10%    Flowsheet Rows     Most Recent Value  Intake Tab  Referral Department  Hospitalist  Palliative Care Primary Diagnosis  Sepsis/Infectious Disease  Palliative Care Type  New Palliative care  Reason for referral  Clarify Goals of Care, Psychosocial or Spiritual support  Date of Admission  11/12/16  Date first seen by Palliative Care  11/13/16  Clinical Assessment  Palliative Performance Scale Score  20%  Psychosocial & Spiritual Assessment  Palliative Care Outcomes  Patient/Family meeting held?  Yes  Who was at the meeting?  [name], and two dtrs with patient.  Palliative Care Outcomes  Clarified goals of care      Patient Active Problem List   Diagnosis Date Noted  . Protein-calorie malnutrition, severe 11/13/2016  . Acute respiratory failure with hypoxia (HCC)   . MRSA bacteremia   . Palliative care encounter   . Sepsis (HCC) 11/12/2016  . Decubitus ulcer of sacral region, stage 3 (HCC) 07/26/2015  . Acute on chronic renal failure (HCC) 07/26/2015  . Hypernatremia 07/26/2015  . Hypokalemia 07/26/2015  . Encephalopathy, metabolic 07/26/2015  . Acute posthemorrhagic anemia 07/26/2015  . Gross hematuria 07/26/2015  . Generalized weakness 07/26/2015  . Dysphagia 07/26/2015  . Moderate malnutrition (HCC) 07/26/2015  . Failure to thrive in adult 07/26/2015  . Dementia 07/10/2015  . Hypotension 07/10/2015  . Acute CHF (congestive heart failure) (HCC) 07/10/2015  . Anasarca 07/10/2015  . Type 2 diabetes mellitus (HCC) 07/10/2015  . Aspiration pneumonia (HCC) 06/21/2015  . Pressure ulcer 06/14/2015  . Altered mental status 06/12/2015     Palliative Care Assessment & Plan   Patient Profile: 83 y.o. female  with past medical history of sacral osteomyelitis, dementia, pacemaker placement, CHF, and gout who was admitted on 11/12/2016 with shortness of breath x 3 days. Work up indicated hypoxic respiratory failure, and sepsis with a temperature of 104.6. Blood cultures positive for staph- not MRSA. Palliative consulted for GOC.   Assessment/Recommendations/Plan   Per consult on Friday with Clerance Lav, [place]- continue to treat what is treatable, discharge home (possibly with Hospice) when stable vs hospital death- patient is not comfort care- however, is DNR- would not offer or discuss feeding tube as this would be contraindicated in frail elderly critically ill patient with dementia whose baseline level of function is bedbound and dependent for all ADL's.  Will continue to follow and assist with GOC - left message for [facility] requesting return call  Code Status:  DNR  Prognosis:   Unable to determine  Discharge Planning:  To Be Determined  Care plan was discussed with Dr. Nemiah Commander.   Thank you for allowing the Palliative Medicine Team to assist in the care of this patient.   Time In: 1230 Time Out: 1250 Total Time 20 minutes Prolonged Time Billed No      Greater than 50%  of this time was spent counseling and coordinating care related to the above assessment and plan.  Ocie Bob, AGNP-C Palliative Medicine   Please contact Palliative Medicine Team phone at [phone] for questions and concerns.       \

## 2016-11-16 NOTE — Progress Notes (Signed)
Dr. Sheryle Hail notified of K+ at 3.0, no new orders at this time.

## 2016-11-16 NOTE — Progress Notes (Signed)
Nutrition Follow-up  DOCUMENTATION CODES:   Severe malnutrition in context of chronic illness  INTERVENTION:  1. Monitor for GOC  NUTRITION DIAGNOSIS:   Malnutrition related to chronic illness (severe dementia) as evidenced by severe depletion of body fat, severe depletion of muscle mass, percent weight loss. -ongoing  GOAL:   Patient will meet greater than or equal to 90% of their needs -not meeting  MONITOR:   PO intake, Supplement acceptance, Labs, Weight trends, Skin  REASON FOR ASSESSMENT:   Other (Comment) (low BMI)    ASSESSMENT:   83 y.o. female  with past medical history of sacral osteomyelitis, dementia, pacemaker placement, CHF, and gout who was admitted on 11/12/2016 with shortness of breath x 3 days. Work up indicated hypoxic respiratory failure, and sepsis with a temperature of 104.6. Blood cultures are positive for MRSA.  Seen by palliative today re: goals of care. Continues to be non-responsive, not eating or drinking. Treating what is treatable at this point and possible d/c home with hospice vs hospital death. Not comfort care at this point, but patient is not a candidate for nutrition support.  Labs and medications reviewed.  Diet Order:  Diet NPO time specified Except for: Other (See Comments)  Skin:  Wound (see comment) (Stage III coccyx)  Last BM:  PTA  Height:   Ht Readings from Last 1 Encounters:  11/12/16 5\' 2"  (1.575 m)    Weight:   Wt Readings from Last 1 Encounters:  11/16/16 96 lb 1 oz (43.6 kg)    Ideal Body Weight:  50 kg  BMI:  Body mass index is 17.57 kg/m.  Estimated Nutritional Needs:   Kcal:  1500-1800kcal/day   Protein:  81-89g/day   Fluid:  >1.5L/day   EDUCATION NEEDS:   Education needs no appropriate at this time  Dionne Ano. Elden Brucato, MS, RD LDN Inpatient Clinical Dietitian Pager [phone]

## 2016-11-16 NOTE — Progress Notes (Signed)
[employer] [facility] [facility] [place] [facility]   PATIENT NAME: Jasmine Briggs    MR#:  [id number]  DATE OF BIRTH:  November 27, 1920  SUBJECTIVE:  CHIEF COMPLAINT:   Chief Complaint  Patient presents with  . Loss of Consciousness  . Code Sepsis   - no change in patient's mental status. Moaning/ grunting occasionally. -Not following commands.  REVIEW OF SYSTEMS:  Review of Systems  Unable to perform ROS: Patient unresponsive    DRUG ALLERGIES:  No Known Allergies  VITALS:  Blood pressure (!) 156/77, pulse 70, temperature (!) 97.5 F (36.4 C), temperature source Axillary, resp. rate 16, height 5\' 2"  (1.575 m), weight 43.6 kg (96 lb 1 oz), SpO2 98 %.  PHYSICAL EXAMINATION:  Physical Exam  GENERAL:  [name] lying in the bed with no acute distress.  EYES: Pupils equal, round, reactive to light and accommodation. No scleral icterus. Extraocular muscles intact.  HEENT: Head atraumatic, normocephalic. Oropharynx and nasopharynx clear. Temporal wasting is noted NECK:  Supple, no jugular venous distention. No thyroid enlargement, no tenderness.  LUNGS: Normal breath sounds bilaterally, no wheezing, rales or crepitation. No use of accessory muscles of respiration. Decreased bibasilar breath sounds. Upper airway rhonchi noted CARDIOVASCULAR: S1, S2 normal. No murmurs, rubs, or gallops. Right chest pacemaker in place without any swelling or erythema or tenderness. ABDOMEN: Soft, nontender, nondistended. Bowel sounds present. No organomegaly or mass.  EXTREMITIES: No pedal edema, cyanosis, or clubbing.  NEUROLOGIC: Drowsy on exam, not following commands..  PSYCHIATRIC: The patient is lethargic  SKIN: Has a stage III sacral decubitus ulcer, present on admission   LABORATORY PANEL:   CBC  Recent Labs Lab 11/16/16 0258  WBC 11.7*  HGB 10.1*  HCT 31.2*  PLT 128*    ------------------------------------------------------------------------------------------------------------------  Chemistries   Recent Labs Lab 11/12/16 1008  11/16/16 0258 11/16/16 0859  NA 161*  < > 144 142  K 4.0  < > 3.0*  --   CL >130*  < > 119*  --   CO2 15*  < > 18*  --   GLUCOSE 197*  < > 126*  --   BUN 38*  < > 17  --   CREATININE 1.77*  < > 0.61  --   CALCIUM 9.7  < > 8.3*  --   AST 27  --   --   --   ALT 11*  --   --   --   ALKPHOS 42  --   --   --   BILITOT 1.3*  --   --   --   < > = values in this interval not displayed. ------------------------------------------------------------------------------------------------------------------  Cardiac Enzymes  Recent Labs Lab 11/12/16 1008  TROPONINI 0.34*   ------------------------------------------------------------------------------------------------------------------  RADIOLOGY:  No results found.  EKG:   Orders placed or performed during the hospital encounter of 11/12/16  . EKG 12-Lead  . EKG 12-Lead  . EKG 12-Lead  . EKG 12-Lead    ASSESSMENT AND PLAN:   83 year old female with known history of dementia, bedbound status, arthritis, diabetes, hypertension and a sacral wound with osteomyelitis presents to hospital secondary to poor oral intake and decreased responsiveness.  #1 severe hypernatremia and hyperchloremia-secondary to free water deficit. -on D5 1/2 ns at this time. Monitor sodium. Hypokalemia-being replaced. Check magnesium  #2 acute metabolic encephalopathy-worse than baseline. At baseline she is able to sit and converse with family. -Secondary to her hypernatremia and sepsis -No significant improvement noted so far.  But have been notified that she occasionally answers to family  #3 sepsis-secondary to gram-positive cocci bacteremia- ?  coagulase negative staph, BCID with possible MRSA. -Received vancomycin and Zosyn, has coagulase negative staph resistant to oxacillin. Continue  vancomycin and discontinue Zosyn. -WBC is improving. Appreciate ID consult. -Echocardiogram negative for endocarditis. However patient has a pacemaker though it does not look infected, if fevers persist and WBC still elevated, consider EP consult for removal of pacemaker if the family wants to pursue aggressive treatment  #4 severe protein calorie malnutrition-with significant temporal wasting noted. Poor prognostic sign. Patient is not alert enough to pass speech eval at this time. Continue fluids  #5 acute bronchitis-on antibiotics. Unable to clear secretions. Follow-up chest x-ray with an infiltrate consistent with pneumonia  #6 acute renal failure on CK D stage III-monitor while on IV fluids  #7 DVT prophylaxis heparin subcutaneous heparin  Palliative care consult is appreciated. Agree with poor prognosis.  Discussed with son at bedside about poor prognosis. He says his brother is the power of attorney, trying to reach patient's son Jasmine Briggs.    All the records are reviewed and case discussed with Care Management/Social Workerr. Management plans discussed with the patient, family and they are in agreement.  CODE STATUS: DNR  TOTAL TIME TAKING CARE OF THIS PATIENT: 35 minutes.   POSSIBLE D/C IN 1-2 DAYS, DEPENDING ON CLINICAL CONDITION.   Jasmine Briggs M.D on 11/16/2016 at 3:25 PM  Between 7am to 6pm - Pager - [phone]  After 6pm go to [email or web address] - Social research officer, government  [facility]  Office  [phone]  CC: Primary care physician; Jasmine Bushman, MD

## 2016-11-16 NOTE — Progress Notes (Signed)
Dr. Sheryle Hail placed new orders for low K+, see emar

## 2016-11-16 NOTE — Progress Notes (Signed)
ANTIBIOTIC CONSULT NOTE - FOLLOW UP   Pharmacy Consult for Vancomycin and Zosyn Indication: sepsis  No Known Allergies  Patient Measurements: Height: 5\' 2"  (157.5 cm) Weight: 96 lb 1 oz (43.6 kg) IBW/kg (Calculated) : 50.1 Adjusted Body Weight:   Vital Signs: Temp: 97.6 F (36.4 C) (08/27 1913) Temp Source: Oral (08/27 1913) BP: 128/70 (08/27 1913) Pulse Rate: 72 (08/27 1913) Intake/Output from previous day: 08/26 0701 - 08/27 0700 In: 1668 [I.V.:1118; IV Piggyback:550] Out: 550 [Urine:550] Intake/Output from this shift: No intake/output data recorded.  Labs:  Recent Labs  11/14/16 0317 11/14/16 0957 11/15/16 0335 11/16/16 0258  WBC 39.1*  --  18.9* 11.7*  HGB 11.8*  --  10.5* 10.1*  PLT 156  --  143* 128*  CREATININE  --  1.18* 1.05* 0.61   Estimated Creatinine Clearance: 30.2 mL/min (by C-G formula based on SCr of 0.61 mg/dL).  Recent Labs  11/16/16 2246  VANCOTROUGH 11*     Microbiology: Recent Results (from the past 720 hour(s))  Blood Culture (routine x 2)     Status: Abnormal   Collection Time: 11/12/16 10:08 AM  Result Value Ref Range Status   Specimen Description BLOOD RIGHT HAND  Final   Special Requests   Final    BOTTLES DRAWN AEROBIC AND ANAEROBIC Blood Culture adequate volume   Culture  Setup Time   Final    GRAM POSITIVE COCCI IN BOTH AEROBIC AND ANAEROBIC BOTTLES CRITICAL VALUE NOTED.  VALUE IS CONSISTENT WITH PREVIOUSLY REPORTED AND CALLED VALUE.    Culture STAPHYLOCOCCUS SPECIES (COAGULASE NEGATIVE) (A)  Final   Report Status 11/16/2016 FINAL  Final   Organism ID, Bacteria STAPHYLOCOCCUS SPECIES (COAGULASE NEGATIVE)  Final      Susceptibility   Staphylococcus species (coagulase negative) - MIC*    CIPROFLOXACIN <=0.5 SENSITIVE Sensitive     ERYTHROMYCIN >=8 RESISTANT Resistant     GENTAMICIN <=0.5 SENSITIVE Sensitive     OXACILLIN RESISTANT Resistant     VANCOMYCIN <=0.5 SENSITIVE Sensitive     TRIMETH/SULFA <=10 SENSITIVE  Sensitive     CLINDAMYCIN RESISTANT Resistant     RIFAMPIN <=0.5 SENSITIVE Sensitive     Inducible Clindamycin POSITIVE Resistant     * STAPHYLOCOCCUS SPECIES (COAGULASE NEGATIVE)  Blood Culture (routine x 2)     Status: Abnormal   Collection Time: 11/12/16 10:08 AM  Result Value Ref Range Status   Specimen Description BLOOD LEFT HAND  Final   Special Requests   Final    BOTTLES DRAWN AEROBIC AND ANAEROBIC Blood Culture adequate volume   Culture  Setup Time   Final    GRAM POSITIVE COCCI IN BOTH AEROBIC AND ANAEROBIC BOTTLES CRITICAL RESULT CALLED TO, READ BACK BY AND VERIFIED WITH: MATT MCBANE @ 0553 ON 11/13/2016 BY CAF    Culture (A)  Final    STAPHYLOCOCCUS SPECIES (COAGULASE NEGATIVE) SUSCEPTIBILITIES PERFORMED ON PREVIOUS CULTURE WITHIN THE LAST 5 DAYS. Performed at [facility], [address] [place], [place] [address]    Report Status 11/16/2016 FINAL  Final  Urine culture     Status: None   Collection Time: 11/12/16 10:08 AM  Result Value Ref Range Status   Specimen Description URINE, RANDOM  Final   Special Requests NONE  Final   Culture   Final    NO GROWTH Performed at [facility], [address], [place], [place] [address]    Report Status 11/13/2016 FINAL  Final  Blood Culture ID Panel (Reflexed)  Status: Abnormal   Collection Time: 11/12/16 10:08 AM  Result Value Ref Range Status   Enterococcus species NOT DETECTED NOT DETECTED Final   Listeria monocytogenes NOT DETECTED NOT DETECTED Final   Staphylococcus species DETECTED (A) NOT DETECTED Final    Comment: Methicillin (oxacillin) resistant coagulase negative staphylococcus. Possible blood culture contaminant (unless isolated from more than one blood culture draw or clinical case suggests pathogenicity). No antibiotic treatment is indicated for blood  culture contaminants. CRITICAL RESULT CALLED TO, READ BACK BY AND VERIFIED WITH: MATT MCBANE @ 0553 ON 11/13/2016 BY CAF    Staphylococcus  aureus NOT DETECTED NOT DETECTED Final   Methicillin resistance DETECTED (A) NOT DETECTED Final    Comment: CRITICAL RESULT CALLED TO, READ BACK BY AND VERIFIED WITH: MATT MCBANE @ 0553 ON 11/13/2016 BY CAF    Streptococcus species NOT DETECTED NOT DETECTED Final   Streptococcus agalactiae NOT DETECTED NOT DETECTED Final   Streptococcus pneumoniae NOT DETECTED NOT DETECTED Final   Streptococcus pyogenes NOT DETECTED NOT DETECTED Final   Acinetobacter baumannii NOT DETECTED NOT DETECTED Final   Enterobacteriaceae species NOT DETECTED NOT DETECTED Final   Enterobacter cloacae complex NOT DETECTED NOT DETECTED Final   Escherichia coli NOT DETECTED NOT DETECTED Final   Klebsiella oxytoca NOT DETECTED NOT DETECTED Final   Klebsiella pneumoniae NOT DETECTED NOT DETECTED Final   Proteus species NOT DETECTED NOT DETECTED Final   Serratia marcescens NOT DETECTED NOT DETECTED Final   Haemophilus influenzae NOT DETECTED NOT DETECTED Final   Neisseria meningitidis NOT DETECTED NOT DETECTED Final   Pseudomonas aeruginosa NOT DETECTED NOT DETECTED Final   Candida albicans NOT DETECTED NOT DETECTED Final   Candida glabrata NOT DETECTED NOT DETECTED Final   Candida krusei NOT DETECTED NOT DETECTED Final   Candida parapsilosis NOT DETECTED NOT DETECTED Final   Candida tropicalis NOT DETECTED NOT DETECTED Final  Culture, blood (Routine X 2) w Reflex to ID Panel     Status: Abnormal   Collection Time: 11/13/16  9:00 PM  Result Value Ref Range Status   Specimen Description BLOOD LEFT HAND  Final   Special Requests   Final    BOTTLES DRAWN AEROBIC AND ANAEROBIC Blood Culture adequate volume   Culture  Setup Time   Final    GRAM POSITIVE COCCI ANAEROBIC BOTTLE ONLY CRITICAL RESULT CALLED TO, READ BACK BY AND VERIFIED WITH: STEPHANIE SHUDER ON 11/15/16 AT 1111 [facility]    Culture (A)  Final    STAPHYLOCOCCUS SPECIES (COAGULASE NEGATIVE) SUSCEPTIBILITIES PERFORMED ON PREVIOUS CULTURE WITHIN THE LAST 5  DAYS. Performed at [facility], [address] [place], [place] [address]    Report Status 11/16/2016 FINAL  Final  Culture, blood (Routine X 2) w Reflex to ID Panel     Status: None (Preliminary result)   Collection Time: 11/13/16  9:12 PM  Result Value Ref Range Status   Specimen Description BLOOD LEFT ARM  Final   Special Requests   Final    BOTTLES DRAWN AEROBIC AND ANAEROBIC Blood Culture adequate volume   Culture NO GROWTH 3 DAYS  Final   Report Status PENDING  Incomplete    Medical History: Past Medical History:  Diagnosis Date  . Arthritis   . Dementia   . Diabetes mellitus without complication (HCC)   . Gout   . Hypertension   . Osteomyelitis (HCC)    sacral wound    Medications:  Prescriptions Prior to Admission  Medication Sig Dispense Refill Last Dose  .  acetaminophen (TYLENOL) 500 MG tablet Take 500 mg by mouth every 6 (six) hours as needed for mild pain.    PRN at PRN  . torsemide (DEMADEX) 20 MG tablet Take 1 tablet (20 mg total) by mouth 2 (two) times daily. 60 tablet 5 11/11/2016 at 2000  . nicotine (NICODERM CQ - DOSED IN MG/24 HR) 7 mg/24hr patch Place 1 patch (7 mg total) onto the skin daily. (Patient not taking: Reported on 11/12/2016) 28 patch 0 Not Taking at Unknown time  . ondansetron (ZOFRAN) 4 MG tablet Take 1 tablet (4 mg total) by mouth every 6 (six) hours as needed for nausea. (Patient not taking: Reported on 11/12/2016) 20 tablet 0 Not Taking at Unknown time  . potassium chloride 20 MEQ/15ML (10%) SOLN Take 15 mLs (20 mEq total) by mouth daily. (Patient not taking: Reported on 11/12/2016) 450 mL 0 Not Taking at Unknown time   Scheduled:  . chlorhexidine  15 mL Mouth Rinse BID  . docusate sodium  100 mg Oral BID  . heparin  5,000 Units Subcutaneous Q8H  . insulin aspart  0-9 Units Subcutaneous Q6H  . ipratropium-albuterol  3 mL Nebulization Q6H  . mouth rinse  15 mL Mouth Rinse q12n4p   Assessment: Pharmacy consulted to dose and monitor  Vancomycin and Zosyn in this critically ill elderly woman.   Goal of Therapy:  Vancomycin Trough level 15-20   Plan:  Vancomycin 500 mg IV q36 hours  Will change Zosyn to 3.375 g IV q8 hours.   8/27:  Vanc trough @ 22:46 = 11 mcg/mL Will increase dose to Vancomycin 500 mg IV Q24H to start 8/28 @ 00:00. Will recheck VT before third new dose on 8/30 @ 23:30.  [place] Clos D 11/16/2016,11:56 PM

## 2016-11-16 NOTE — Progress Notes (Signed)
SLP Cancellation Note  Patient Details Name: Jasmine Briggs MRN: [id number] DOB: 03/02/1921   Cancelled treatment:       Reason Eval/Treat Not Completed: Fatigue/lethargy limiting ability to participate. MD contacted SLP re: pt appropriateness for po trials. Per MD, pt currently not sufficiently alert for safe po intake. SLP will continue efforts tomorrow, proceeding as pt is able.   Celia B. Murvin Natal, [facility], CCC-SLP Speech Pathologist [phone] 11/16/2016, 1:40 PM

## 2016-11-16 NOTE — Care Management (Signed)
RNCM consult received and will continue to follow. I delivered important message from medicare to one of patient's family members (that I've met before but forgot his name).  He said that they are not interested in Hospice service. I have not sure that home health would be appropriate as patient needs long-term care and family states that they are providing patient with that. RNCM will follow for wound care needs as they have accepted home health in the past. This family member asked that I contact brother Lily Peer [phone] to discuss further needs. Patient did not respond to any questions- she slept on her right side during my visit. I contacted Ramond and left message with him about home health services if it is medically necessary. Patient will need EMS to home.

## 2016-11-16 NOTE — Progress Notes (Signed)
[facility] INFECTIOUS DISEASE PROGRESS NOTE Date of Admission:  11/12/2016     ID: Jasmine Briggs is a 83 y.o. female with coag neg staph bacteremia Active Problems:   Sepsis (HCC)   Acute respiratory failure with hypoxia (HCC)   MRSA bacteremia   Palliative care encounter   Protein-calorie malnutrition, severe   Encephalopathy   Subjective: Per family a little more interactive but during my exam she is laying on R side and really not interactive   ROS  Unable to obtain   Medications:  Antibiotics Given (last 72 hours)    Date/Time Action Medication Dose Rate   11/13/16 2232 New Bag/Given   vancomycin (VANCOCIN) 500 mg in sodium chloride 0.9 % 100 mL IVPB 500 mg 100 mL/hr   11/13/16 2232 New Bag/Given   piperacillin-tazobactam (ZOSYN) IVPB 3.375 g 3.375 g 12.5 mL/hr   11/14/16 1125 New Bag/Given   piperacillin-tazobactam (ZOSYN) IVPB 3.375 g 3.375 g 12.5 mL/hr   11/14/16 2217 New Bag/Given   piperacillin-tazobactam (ZOSYN) IVPB 3.375 g 3.375 g 12.5 mL/hr   11/15/16 1113 New Bag/Given   vancomycin (VANCOCIN) 500 mg in sodium chloride 0.9 % 100 mL IVPB 500 mg 100 mL/hr   11/15/16 1113 New Bag/Given   piperacillin-tazobactam (ZOSYN) IVPB 3.375 g 3.375 g 12.5 mL/hr   11/15/16 2124 New Bag/Given   piperacillin-tazobactam (ZOSYN) IVPB 3.375 g 3.375 g 12.5 mL/hr   11/16/16 0304 New Bag/Given   piperacillin-tazobactam (ZOSYN) IVPB 3.375 g 3.375 g 12.5 mL/hr     . chlorhexidine  15 mL Mouth Rinse BID  . docusate sodium  100 mg Oral BID  . heparin  5,000 Units Subcutaneous Q8H  . insulin aspart  0-9 Units Subcutaneous Q6H  . ipratropium-albuterol  3 mL Nebulization Q6H  . mouth rinse  15 mL Mouth Rinse q12n4p    Objective: Vital signs in last 24 hours: Temp:  [97.5 F (36.4 C)-97.7 F (36.5 C)] 97.5 F (36.4 C) (08/27 0816) Pulse Rate:  [66-70] 70 (08/27 0816) Resp:  [16] 16 (08/27 0816) BP: (135-156)/(68-87) 156/77 (08/27 0816) SpO2:  [95 %-99 %] 98 % (08/27  0816) Weight:  [43.6 kg (96 lb 1 oz)] 43.6 kg (96 lb 1 oz) (08/27 0500) Constitutional:  Frail, lethargic HENT: [place]/AT, PERRLA, no scleral icterus Mouth/Throat: Oropharynx is clear and moist. No oropharyngeal exudate.  Cardiovascular: Normal rate, regular rhythm and normal heart sounds. Pulmonary/Chest: Effort normal and breath sounds normal. Neck = supple, no nuchal rigidity Abdominal: Soft. Bowel sounds are normal.  exhibits no distension. There is no tenderness.  Lymphadenopathy: no cervical adenopathy. No axillary adenopathy Neurological: laying in bed and minimall responsive Skin: per wound care  Sacrum, left aspect 3 cm x 1.4 cm x 0.3 cm Right aspect 1 cm x 0.5 cm x 0.1 cm scarring present circumferentially where areas have healed. Bony prominence evident.  Wound ZOX:WRUE and moist Psychiatric: unable to assess Lab Results  Recent Labs  11/15/16 0335  11/16/16 0258 11/16/16 0859  WBC 18.9*  --  11.7*  --   HGB 10.5*  --  10.1*  --   HCT 32.3*  --  31.2*  --   NA 147*  < > 144 142  K 3.6  --  3.0*  --   CL 124*  --  119*  --   CO2 16*  --  18*  --   BUN 27*  --  17  --   CREATININE 1.05*  --  0.61  --   < > =  values in this interval not displayed.  Microbiology: Results for orders placed or performed during the hospital encounter of 11/12/16  Blood Culture (routine x 2)     Status: Abnormal   Collection Time: 11/12/16 10:08 AM  Result Value Ref Range Status   Specimen Description BLOOD RIGHT HAND  Final   Special Requests   Final    BOTTLES DRAWN AEROBIC AND ANAEROBIC Blood Culture adequate volume   Culture  Setup Time   Final    GRAM POSITIVE COCCI IN BOTH AEROBIC AND ANAEROBIC BOTTLES CRITICAL VALUE NOTED.  VALUE IS CONSISTENT WITH PREVIOUSLY REPORTED AND CALLED VALUE.    Culture STAPHYLOCOCCUS SPECIES (COAGULASE NEGATIVE) (A)  Final   Report Status 11/16/2016 FINAL  Final   Organism ID, Bacteria STAPHYLOCOCCUS SPECIES (COAGULASE NEGATIVE)  Final       Susceptibility   Staphylococcus species (coagulase negative) - MIC*    CIPROFLOXACIN <=0.5 SENSITIVE Sensitive     ERYTHROMYCIN >=8 RESISTANT Resistant     GENTAMICIN <=0.5 SENSITIVE Sensitive     OXACILLIN RESISTANT Resistant     VANCOMYCIN <=0.5 SENSITIVE Sensitive     TRIMETH/SULFA <=10 SENSITIVE Sensitive     CLINDAMYCIN RESISTANT Resistant     RIFAMPIN <=0.5 SENSITIVE Sensitive     Inducible Clindamycin POSITIVE Resistant     * STAPHYLOCOCCUS SPECIES (COAGULASE NEGATIVE)  Blood Culture (routine x 2)     Status: Abnormal   Collection Time: 11/12/16 10:08 AM  Result Value Ref Range Status   Specimen Description BLOOD LEFT HAND  Final   Special Requests   Final    BOTTLES DRAWN AEROBIC AND ANAEROBIC Blood Culture adequate volume   Culture  Setup Time   Final    GRAM POSITIVE COCCI IN BOTH AEROBIC AND ANAEROBIC BOTTLES CRITICAL RESULT CALLED TO, READ BACK BY AND VERIFIED WITH: MATT MCBANE @ 0553 ON 11/13/2016 BY CAF    Culture (A)  Final    STAPHYLOCOCCUS SPECIES (COAGULASE NEGATIVE) SUSCEPTIBILITIES PERFORMED ON PREVIOUS CULTURE WITHIN THE LAST 5 DAYS. Performed at [facility], [address] [place], [place] [address]    Report Status 11/16/2016 FINAL  Final  Urine culture     Status: None   Collection Time: 11/12/16 10:08 AM  Result Value Ref Range Status   Specimen Description URINE, RANDOM  Final   Special Requests NONE  Final   Culture   Final    NO GROWTH Performed at [facility], [address], [place], [place] [address]    Report Status 11/13/2016 FINAL  Final  Blood Culture ID Panel (Reflexed)     Status: Abnormal   Collection Time: 11/12/16 10:08 AM  Result Value Ref Range Status   Enterococcus species NOT DETECTED NOT DETECTED Final   Listeria monocytogenes NOT DETECTED NOT DETECTED Final   Staphylococcus species DETECTED (A) NOT DETECTED Final    Comment: Methicillin (oxacillin) resistant coagulase negative staphylococcus. Possible blood  culture contaminant (unless isolated from more than one blood culture draw or clinical case suggests pathogenicity). No antibiotic treatment is indicated for blood  culture contaminants. CRITICAL RESULT CALLED TO, READ BACK BY AND VERIFIED WITH: MATT MCBANE @ 0553 ON 11/13/2016 BY CAF    Staphylococcus aureus NOT DETECTED NOT DETECTED Final   Methicillin resistance DETECTED (A) NOT DETECTED Final    Comment: CRITICAL RESULT CALLED TO, READ BACK BY AND VERIFIED WITH: MATT MCBANE @ 0553 ON 11/13/2016 BY CAF    Streptococcus species NOT DETECTED NOT DETECTED Final  Streptococcus agalactiae NOT DETECTED NOT DETECTED Final   Streptococcus pneumoniae NOT DETECTED NOT DETECTED Final   Streptococcus pyogenes NOT DETECTED NOT DETECTED Final   Acinetobacter baumannii NOT DETECTED NOT DETECTED Final   Enterobacteriaceae species NOT DETECTED NOT DETECTED Final   Enterobacter cloacae complex NOT DETECTED NOT DETECTED Final   Escherichia coli NOT DETECTED NOT DETECTED Final   Klebsiella oxytoca NOT DETECTED NOT DETECTED Final   Klebsiella pneumoniae NOT DETECTED NOT DETECTED Final   Proteus species NOT DETECTED NOT DETECTED Final   Serratia marcescens NOT DETECTED NOT DETECTED Final   Haemophilus influenzae NOT DETECTED NOT DETECTED Final   Neisseria meningitidis NOT DETECTED NOT DETECTED Final   Pseudomonas aeruginosa NOT DETECTED NOT DETECTED Final   Candida albicans NOT DETECTED NOT DETECTED Final   Candida glabrata NOT DETECTED NOT DETECTED Final   Candida krusei NOT DETECTED NOT DETECTED Final   Candida parapsilosis NOT DETECTED NOT DETECTED Final   Candida tropicalis NOT DETECTED NOT DETECTED Final  Culture, blood (Routine X 2) w Reflex to ID Panel     Status: Abnormal   Collection Time: 11/13/16  9:00 PM  Result Value Ref Range Status   Specimen Description BLOOD LEFT HAND  Final   Special Requests   Final    BOTTLES DRAWN AEROBIC AND ANAEROBIC Blood Culture adequate volume   Culture   Setup Time   Final    GRAM POSITIVE COCCI ANAEROBIC BOTTLE ONLY CRITICAL RESULT CALLED TO, READ BACK BY AND VERIFIED WITH: STEPHANIE SHUDER ON 11/15/16 AT 1111 [facility]    Culture (A)  Final    STAPHYLOCOCCUS SPECIES (COAGULASE NEGATIVE) SUSCEPTIBILITIES PERFORMED ON PREVIOUS CULTURE WITHIN THE LAST 5 DAYS. Performed at [facility], [address], [place], [place] [address]    Report Status 11/16/2016 FINAL  Final  Culture, blood (Routine X 2) w Reflex to ID Panel     Status: None (Preliminary result)   Collection Time: 11/13/16  9:12 PM  Result Value Ref Range Status   Specimen Description BLOOD LEFT ARM  Final   Special Requests   Final    BOTTLES DRAWN AEROBIC AND ANAEROBIC Blood Culture adequate volume   Culture NO GROWTH 3 DAYS  Final   Report Status PENDING  Incomplete    Studies/Results: - Left ventricle: The cavity size was normal. Systolic function was   moderately to severely reduced. The estimated ejection fraction   was in the range of 30% to 35%. Diffuse hypokinesis. - Aortic valve: There was mild regurgitation. - Left atrium: The atrium was mildly dilated. - Right ventricle: The cavity size was moderately dilated. - Right atrium: The atrium was mildly dilated.  Assessment/Plan: Jasmine Briggs is a 83 y.o. female with advanced dementia, largely bedbound, hx healing decub ulcers admitted with fevers, AMS, Na 161,and WBC 36.  BCX + Staph species (MR) in 2/2 sets. Complicated by fact that she has a PPM. 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  TTE is negative but fu bcx 1/2 is positive for same organism. This strongly suggests PPM endocarditis  I discussed with the family that the options would include TEE and possible removal of PPM vs empiric 6 week course of IV abx with close follow up. She would be a poor candidate for TEE and very poor candidate for removal of the PPM on which it  sounds like she is depenedent. Family reports PPM placed  and followed at [facility] agrees with empiric treatment and we will proceed with repeat bcx to document clearance then picc placement.    Recommendations Repeat bcx has 1/2 positive so repeat another set.  Once BCx neg 48 hours can place picc Cont vanco for 6 weeks Add oral rifampin   Thank you very much for the consult. Will follow with you.  Mick Sell   11/16/2016, 4:33 PM

## 2016-11-17 DIAGNOSIS — Z7189 Other specified counseling: Secondary | ICD-10-CM

## 2016-11-17 LAB — BASIC METABOLIC PANEL WITH GFR
Anion gap: 4 — ABNORMAL LOW (ref 5–15)
BUN: 11 mg/dL (ref 6–20)
CO2: 20 mmol/L — ABNORMAL LOW (ref 22–32)
Calcium: 8.4 mg/dL — ABNORMAL LOW (ref 8.9–10.3)
Chloride: 119 mmol/L — ABNORMAL HIGH (ref 101–111)
Creatinine, Ser: 0.7 mg/dL (ref 0.44–1.00)
GFR calc Af Amer: >60 mL/min
GFR calc non Af Amer: >60 mL/min
Glucose, Bld: 96 mg/dL (ref 65–99)
Potassium: 3.2 mmol/L — ABNORMAL LOW (ref 3.5–5.1)
Sodium: 143 mmol/L (ref 135–145)

## 2016-11-17 LAB — GLUCOSE, CAPILLARY
GLUCOSE-CAPILLARY: 85 mg/dL (ref 65–99)
GLUCOSE-CAPILLARY: 90 mg/dL (ref 65–99)
GLUCOSE-CAPILLARY: 93 mg/dL (ref 65–99)
GLUCOSE-CAPILLARY: 95 mg/dL (ref 65–99)
Glucose-Capillary: 141 mg/dL — ABNORMAL HIGH (ref 65–99)

## 2016-11-17 LAB — CBC
HCT: 30 % — ABNORMAL LOW (ref 35.0–47.0)
Hemoglobin: 9.9 g/dL — ABNORMAL LOW (ref 12.0–16.0)
MCH: 28.6 pg (ref 26.0–34.0)
MCHC: 33.1 g/dL (ref 32.0–36.0)
MCV: 86.3 fL (ref 80.0–100.0)
PLATELETS: 140 10*3/uL — AB (ref 150–440)
RBC: 3.48 MIL/uL — AB (ref 3.80–5.20)
RDW: 16.1 % — AB (ref 11.5–14.5)
WBC: 11.8 10*3/uL — AB (ref 3.6–11.0)

## 2016-11-17 MED ORDER — RIFAMPIN 300 MG PO CAPS
300.0000 mg | ORAL_CAPSULE | Freq: Two times a day (BID) | ORAL | Status: DC
  Administered 2016-11-17 – 2016-11-20 (×6): 300 mg via ORAL
  Filled 2016-11-17 (×8): qty 1

## 2016-11-17 MED ORDER — KCL IN DEXTROSE-NACL 20-5-0.45 MEQ/L-%-% IV SOLN
INTRAVENOUS | Status: DC
  Administered 2016-11-17 – 2016-11-19 (×5): via INTRAVENOUS
  Filled 2016-11-17 (×6): qty 1000

## 2016-11-17 NOTE — Progress Notes (Signed)
[facility] INFECTIOUS DISEASE PROGRESS NOTE Date of Admission:  11/12/2016     ID: Jasmine Briggs is a 83 y.o. female with coag neg staph bacteremia Active Problems:   Sepsis (HCC)   Acute respiratory failure with hypoxia (HCC)   MRSA bacteremia   Palliative care encounter   Protein-calorie malnutrition, severe   Encephalopathy   Advance care planning   Goals of care, counseling/discussion   Subjective: Per family a little more interactive but during my exam she is laying on R side and really not interactive   ROS  Unable to obtain   Medications:  Antibiotics Given (last 72 hours)    Date/Time Action Medication Dose Rate   11/14/16 2217 New Bag/Given   piperacillin-tazobactam (ZOSYN) IVPB 3.375 g 3.375 g 12.5 mL/hr   11/15/16 1113 New Bag/Given   vancomycin (VANCOCIN) 500 mg in sodium chloride 0.9 % 100 mL IVPB 500 mg 100 mL/hr   11/15/16 1113 New Bag/Given   piperacillin-tazobactam (ZOSYN) IVPB 3.375 g 3.375 g 12.5 mL/hr   11/15/16 2124 New Bag/Given   piperacillin-tazobactam (ZOSYN) IVPB 3.375 g 3.375 g 12.5 mL/hr   11/16/16 0304 New Bag/Given   piperacillin-tazobactam (ZOSYN) IVPB 3.375 g 3.375 g 12.5 mL/hr   11/17/16 0031 New Bag/Given   vancomycin (VANCOCIN) 500 mg in sodium chloride 0.9 % 100 mL IVPB 500 mg 100 mL/hr     . chlorhexidine  15 mL Mouth Rinse BID  . docusate sodium  100 mg Oral BID  . heparin  5,000 Units Subcutaneous Q8H  . insulin aspart  0-9 Units Subcutaneous Q6H  . ipratropium-albuterol  3 mL Nebulization Q6H  . mouth rinse  15 mL Mouth Rinse q12n4p    Objective: Vital signs in last 24 hours: Temp:  [97.6 F (36.4 C)-98 F (36.7 C)] 98 F (36.7 C) (08/28 0338) Pulse Rate:  [69-72] 69 (08/28 0338) Resp:  [17-18] 18 (08/28 0338) BP: (128-146)/(64-70) 146/64 (08/28 0338) SpO2:  [98 %-100 %] 98 % (08/28 0729) Weight:  [45.3 kg (99 lb 13.9 oz)] 45.3 kg (99 lb 13.9 oz) (08/28 0338) Constitutional:  Frail, lethargic HENT: [place]/AT, PERRLA, no  scleral icterus Mouth/Throat: Oropharynx is clear and moist. No oropharyngeal exudate.  Cardiovascular: Normal rate, regular rhythm and normal heart sounds. Pulmonary/Chest: Effort normal and breath sounds normal. Neck = supple, no nuchal rigidity Abdominal: Soft. Bowel sounds are normal.  exhibits no distension. There is no tenderness.  Lymphadenopathy: no cervical adenopathy. No axillary adenopathy Neurological: laying in bed and minimall responsive Skin: per wound care  Sacrum, left aspect 3 cm x 1.4 cm x 0.3 cm Right aspect 1 cm x 0.5 cm x 0.1 cm scarring present circumferentially where areas have healed. Bony prominence evident.  Wound XKG:YJEH and moist Psychiatric: unable to assess Lab Results  Recent Labs  11/16/16 0258  11/16/16 2246 11/17/16 0519  WBC 11.7*  --   --  11.8*  HGB 10.1*  --   --  9.9*  HCT 31.2*  --   --  30.0*  NA 144  < > 142 143  K 3.0*  --   --  3.2*  CL 119*  --   --  119*  CO2 18*  --   --  20*  BUN 17  --   --  11  CREATININE 0.61  --   --  0.70  < > = values in this interval not displayed.  Microbiology: Results for orders placed or performed during the hospital encounter of 11/12/16  Blood Culture (routine x 2)     Status: Abnormal   Collection Time: 11/12/16 10:08 AM  Result Value Ref Range Status   Specimen Description BLOOD RIGHT HAND  Final   Special Requests   Final    BOTTLES DRAWN AEROBIC AND ANAEROBIC Blood Culture adequate volume   Culture  Setup Time   Final    GRAM POSITIVE COCCI IN BOTH AEROBIC AND ANAEROBIC BOTTLES CRITICAL VALUE NOTED.  VALUE IS CONSISTENT WITH PREVIOUSLY REPORTED AND CALLED VALUE.    Culture STAPHYLOCOCCUS SPECIES (COAGULASE NEGATIVE) (A)  Final   Report Status 11/16/2016 FINAL  Final   Organism ID, Bacteria STAPHYLOCOCCUS SPECIES (COAGULASE NEGATIVE)  Final      Susceptibility   Staphylococcus species (coagulase negative) - MIC*    CIPROFLOXACIN <=0.5 SENSITIVE Sensitive     ERYTHROMYCIN >=8 RESISTANT  Resistant     GENTAMICIN <=0.5 SENSITIVE Sensitive     OXACILLIN RESISTANT Resistant     VANCOMYCIN <=0.5 SENSITIVE Sensitive     TRIMETH/SULFA <=10 SENSITIVE Sensitive     CLINDAMYCIN RESISTANT Resistant     RIFAMPIN <=0.5 SENSITIVE Sensitive     Inducible Clindamycin POSITIVE Resistant     * STAPHYLOCOCCUS SPECIES (COAGULASE NEGATIVE)  Blood Culture (routine x 2)     Status: Abnormal   Collection Time: 11/12/16 10:08 AM  Result Value Ref Range Status   Specimen Description BLOOD LEFT HAND  Final   Special Requests   Final    BOTTLES DRAWN AEROBIC AND ANAEROBIC Blood Culture adequate volume   Culture  Setup Time   Final    GRAM POSITIVE COCCI IN BOTH AEROBIC AND ANAEROBIC BOTTLES CRITICAL RESULT CALLED TO, READ BACK BY AND VERIFIED WITH: MATT MCBANE @ 0553 ON 11/13/2016 BY CAF    Culture (A)  Final    STAPHYLOCOCCUS SPECIES (COAGULASE NEGATIVE) SUSCEPTIBILITIES PERFORMED ON PREVIOUS CULTURE WITHIN THE LAST 5 DAYS. Performed at [facility], [address] [place], [place] [address]    Report Status 11/16/2016 FINAL  Final  Urine culture     Status: None   Collection Time: 11/12/16 10:08 AM  Result Value Ref Range Status   Specimen Description URINE, RANDOM  Final   Special Requests NONE  Final   Culture   Final    NO GROWTH Performed at [facility], [address] [place], [place] [address]    Report Status 11/13/2016 FINAL  Final  Blood Culture ID Panel (Reflexed)     Status: Abnormal   Collection Time: 11/12/16 10:08 AM  Result Value Ref Range Status   Enterococcus species NOT DETECTED NOT DETECTED Final   Listeria monocytogenes NOT DETECTED NOT DETECTED Final   Staphylococcus species DETECTED (A) NOT DETECTED Final    Comment: Methicillin (oxacillin) resistant coagulase negative staphylococcus. Possible blood culture contaminant (unless isolated from more than one blood culture draw or clinical case suggests pathogenicity). No antibiotic treatment is  indicated for blood  culture contaminants. CRITICAL RESULT CALLED TO, READ BACK BY AND VERIFIED WITH: MATT MCBANE @ 0553 ON 11/13/2016 BY CAF    Staphylococcus aureus NOT DETECTED NOT DETECTED Final   Methicillin resistance DETECTED (A) NOT DETECTED Final    Comment: CRITICAL RESULT CALLED TO, READ BACK BY AND VERIFIED WITH: MATT MCBANE @ 0553 ON 11/13/2016 BY CAF    Streptococcus species NOT DETECTED NOT DETECTED Final   Streptococcus agalactiae NOT DETECTED NOT DETECTED Final   Streptococcus pneumoniae NOT DETECTED NOT DETECTED Final   Streptococcus pyogenes NOT  DETECTED NOT DETECTED Final   Acinetobacter baumannii NOT DETECTED NOT DETECTED Final   Enterobacteriaceae species NOT DETECTED NOT DETECTED Final   Enterobacter cloacae complex NOT DETECTED NOT DETECTED Final   Escherichia coli NOT DETECTED NOT DETECTED Final   Klebsiella oxytoca NOT DETECTED NOT DETECTED Final   Klebsiella pneumoniae NOT DETECTED NOT DETECTED Final   Proteus species NOT DETECTED NOT DETECTED Final   Serratia marcescens NOT DETECTED NOT DETECTED Final   Haemophilus influenzae NOT DETECTED NOT DETECTED Final   Neisseria meningitidis NOT DETECTED NOT DETECTED Final   Pseudomonas aeruginosa NOT DETECTED NOT DETECTED Final   Candida albicans NOT DETECTED NOT DETECTED Final   Candida glabrata NOT DETECTED NOT DETECTED Final   Candida krusei NOT DETECTED NOT DETECTED Final   Candida parapsilosis NOT DETECTED NOT DETECTED Final   Candida tropicalis NOT DETECTED NOT DETECTED Final  Culture, blood (Routine X 2) w Reflex to ID Panel     Status: Abnormal   Collection Time: 11/13/16  9:00 PM  Result Value Ref Range Status   Specimen Description BLOOD LEFT HAND  Final   Special Requests   Final    BOTTLES DRAWN AEROBIC AND ANAEROBIC Blood Culture adequate volume   Culture  Setup Time   Final    GRAM POSITIVE COCCI ANAEROBIC BOTTLE ONLY CRITICAL RESULT CALLED TO, READ BACK BY AND VERIFIED WITH: STEPHANIE SHUDER ON  11/15/16 AT [facility]    Culture (A)  Final    STAPHYLOCOCCUS SPECIES (COAGULASE NEGATIVE) SUSCEPTIBILITIES PERFORMED ON PREVIOUS CULTURE WITHIN THE LAST 5 DAYS. Performed at [facility], [address], [place], [place] [address]    Report Status 11/16/2016 FINAL  Final  Culture, blood (Routine X 2) w Reflex to ID Panel     Status: None (Preliminary result)   Collection Time: 11/13/16  9:12 PM  Result Value Ref Range Status   Specimen Description BLOOD LEFT ARM  Final   Special Requests   Final    BOTTLES DRAWN AEROBIC AND ANAEROBIC Blood Culture adequate volume   Culture NO GROWTH 4 DAYS  Final   Report Status PENDING  Incomplete  Culture, blood (single) w Reflex to ID Panel     Status: None (Preliminary result)   Collection Time: 11/16/16  7:03 PM  Result Value Ref Range Status   Specimen Description BLOOD BLOOD LEFT HAND  Final   Special Requests   Final    BOTTLES DRAWN AEROBIC AND ANAEROBIC Blood Culture results may not be optimal due to an inadequate volume of blood received in culture bottles   Culture NO GROWTH < 12 HOURS  Final   Report Status PENDING  Incomplete    Studies/Results: - Left ventricle: The cavity size was normal. Systolic function was   moderately to severely reduced. The estimated ejection fraction   was in the range of 30% to 35%. Diffuse hypokinesis. - Aortic valve: There was mild regurgitation. - Left atrium: The atrium was mildly dilated. - Right ventricle: The cavity size was moderately dilated. - Right atrium: The atrium was mildly dilated.  Assessment/Plan: Jasmine Briggs is a 83 y.o. female with advanced dementia, largely bedbound, hx healing decub ulcers admitted with fevers, AMS, Na 161,and WBC 36.  BCX + Staph species (MR) in 2/2 sets. Complicated by fact that she has a PPM. 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  TTE is negative but fu bcx  1/2 is  positive for same organism. This strongly suggests PPM endocarditis  I discussed with the family that the options would include TEE and possible removal of PPM vs empiric 6 week course of IV abx with close follow up. She would be a poor candidate for TEE and very poor candidate for removal of the PPM on which it sounds like she is depenedent. Family reports PPM placed and followed at [facility] agrees with empiric treatment and we will proceed with repeat bcx to document clearance then picc placement.    Recommendations Repeat bcx has 1/2 positive so repeat another set.  Once BCx neg 48 hours can place picc Cont vanco for 6 weeks Added oral rifampin - discussed with son, will monitor for side effects.   Thank you very much for the consult. Will follow with you.  Mick Sell   11/17/2016, 2:59 PM

## 2016-11-17 NOTE — Progress Notes (Signed)
Infectious Disease Long Term IV Antibiotic Orders  Diagnosis: Coag neg staph bacteremia, likely PPM infection   Culture results Blood Culture (routine x 2) [[id number]] (Abnormal)  Collected: 11/12/16 1008  Order Status: Completed Specimen: Blood from BLOOD Updated: 11/16/16 0844   Specimen Description BLOOD RIGHT HAND   Special Requests BOTTLES DRAWN AEROBIC AND ANAEROBIC Blood Culture adequate volume   Culture Setup Time --   GRAM POSITIVE COCCI  IN BOTH AEROBIC AND ANAEROBIC BOTTLES  CRITICAL VALUE NOTED. VALUE IS CONSISTENT WITH PREVIOUSLY REPORTED AND CALLED VALUE.    Culture STAPHYLOCOCCUS SPECIES (COAGULASE NEGATIVE) (A)   Report Status 11/16/2016 FINAL   Organism ID, Bacteria STAPHYLOCOCCUS SPECIES (COAGULASE NEGATIVE)  Culture & Susceptibility   STAPHYLOCOCCUS SPECIES (COAGULASE NEGATIVE)   Antibiotic Sensitivity Microscan Status  CIPROFLOXACIN Sensitive <=0.5 SENSITIVE Final  Method: MIC  CLINDAMYCIN Resistant RESISTANT Final  Method: MIC  ERYTHROMYCIN Resistant >=8 RESISTANT Final  Method: MIC  GENTAMICIN Sensitive <=0.5 SENSITIVE Final  Method: MIC  Inducible Clindamycin Resistant POSITIVE Final  Method: MIC  OXACILLIN Resistant RESISTANT Final  Method: MIC  RIFAMPIN Sensitive <=0.5 SENSITIVE Final  Method: MIC  TRIMETH/SULFA Sensitive <=10 SENSITIVE Final  Method: MIC  VANCOMYCIN Sensitive <=0.5 SENSITIVE Final  Method: MIC  Comments STAPHYLOCOCCUS SPECIES (COAGULASE NEGATIVE) (MIC)   STAPHYLOCOCCUS SPECIES (COAGULASE NEGATIVE)             Allergies: No Known Allergies  Discharge antibiotics Vancomycin   500  mg  every  24   hours .     Goal vancomycin trough 15-20.    Pharmacy to adjust dosing based on levels   Oral rifampin 300 mg bid for 6 weeks  PICC Care per protocol Labs weekly while on IV antibiotics      CBC w diff   Comprehensive met panel Vancomycin Trough    Planned duration of antibiotics 6 weeks from 8/27  Stop date Oct 8    Follow up clinic date 3-4 weeks  FAX weekly labs to [phone]  Leonel Ramsay, MD

## 2016-11-17 NOTE — Progress Notes (Signed)
Daily Progress Note   Patient Name: Jasmine Briggs       Date: 11/17/2016 DOB: Nov 07, 1920  Age: 83 y.o. MRN#: [id number] Attending Physician: Enid Baas, MD Primary Care Physician: Kurtis Bushman, MD Admit Date: 11/12/2016  Reason for Consultation/Follow-up: Establishing goals of care  Subjective: Patient in bed. Mental status improved.  Awake, but lethargic. Answers questions appropriately- denies pain, says she is hungry, asks me how I am this morning. She then drifts back to sleep. Her son, Molly Maduro is at bedside. Noted plan to proceed with PICC and six weeks antibiotics. Molly Maduro notes that patient has had PICC in the past and had a nurse come to their home, so they are familiar with this treatment. Discussed option with Molly Maduro of not placing PICC and proceeding with Hospice care if desired. Discussed patient's poor level of functioning- lives bed to chair existence at home- concerns about ability to eat- waiting for swallow eval. She is showing problems with clearing secretions- concerning for aspiration. Molly Maduro expresses that he and his family have had these concerns as well. He is in discussion about this with his family.  Review of Systems  Unable to perform ROS: Mental acuity    Length of Stay: 5  Current Medications: Scheduled Meds:  . chlorhexidine  15 mL Mouth Rinse BID  . docusate sodium  100 mg Oral BID  . heparin  5,000 Units Subcutaneous Q8H  . insulin aspart  0-9 Units Subcutaneous Q6H  . ipratropium-albuterol  3 mL Nebulization Q6H  . mouth rinse  15 mL Mouth Rinse q12n4p    Continuous Infusions: . dextrose 5 % and 0.45 % NaCl with KCl 20 mEq/L    . vancomycin Stopped (11/17/16 0131)    PRN Meds: acetaminophen **OR** acetaminophen, bisacodyl, ondansetron  **OR** ondansetron (ZOFRAN) IV  Physical Exam  Constitutional: She appears well-developed. No distress.  Cardiovascular: Normal rate and regular rhythm.   Pulmonary/Chest: Effort normal.  Cough, audible secretions  Neurological:  Lethargic, oriented only to self  Skin: Skin is warm and dry.  Nursing note and vitals reviewed.           Vital Signs: BP (!) 146/64 (BP Location: Left Arm)   Pulse 69   Temp 98 F (36.7 C)   Resp 18   Ht 5\' 2"  (1.575 m)   Wt  45.3 kg (99 lb 13.9 oz)   SpO2 98%   BMI 18.27 kg/m  SpO2: SpO2: 98 % O2 Device: O2 Device: Nasal Cannula O2 Flow Rate: O2 Flow Rate (L/min): 2 L/min  Intake/output summary:  Intake/Output Summary (Last 24 hours) at 11/17/16 1028 Last data filed at 11/17/16 0300  Gross per 24 hour  Intake             1130 ml  Output                0 ml  Net             1130 ml   LBM: Last BM Date: 11/16/16 Baseline Weight: Weight: 40.8 kg (90 lb) Most recent weight: Weight: 45.3 kg (99 lb 13.9 oz)       Palliative Assessment/Data: PPS: 10%    Flowsheet Rows     Most Recent Value  Intake Tab  Referral Department  Hospitalist  Palliative Care Primary Diagnosis  Sepsis/Infectious Disease  Palliative Care Type  New Palliative care  Reason for referral  Clarify Goals of Care, Psychosocial or Spiritual support  Date of Admission  11/12/16  Date first seen by Palliative Care  11/13/16  Clinical Assessment  Palliative Performance Scale Score  20%  Psychosocial & Spiritual Assessment  Palliative Care Outcomes  Patient/Family meeting held?  Yes  Who was at the meeting?  [name], and two dtrs with patient.  Palliative Care Outcomes  Clarified goals of care      Patient Active Problem List   Diagnosis Date Noted  . Encephalopathy   . Protein-calorie malnutrition, severe 11/13/2016  . Acute respiratory failure with hypoxia (HCC)   . MRSA bacteremia   . Palliative care encounter   . Sepsis (HCC) 11/12/2016  . Decubitus ulcer of  sacral region, stage 3 (HCC) 07/26/2015  . Acute on chronic renal failure (HCC) 07/26/2015  . Hypernatremia 07/26/2015  . Hypokalemia 07/26/2015  . Encephalopathy, metabolic 07/26/2015  . Acute posthemorrhagic anemia 07/26/2015  . Gross hematuria 07/26/2015  . Generalized weakness 07/26/2015  . Dysphagia 07/26/2015  . Moderate malnutrition (HCC) 07/26/2015  . Failure to thrive in adult 07/26/2015  . Dementia 07/10/2015  . Hypotension 07/10/2015  . Acute CHF (congestive heart failure) (HCC) 07/10/2015  . Anasarca 07/10/2015  . Type 2 diabetes mellitus (HCC) 07/10/2015  . Aspiration pneumonia (HCC) 06/21/2015  . Pressure ulcer 06/14/2015  . Altered mental status 06/12/2015    Palliative Care Assessment & Plan   Patient Profile: 83 y.o.femalewith past medical history of sacral osteomyelitis, dementia, pacemaker placement, CHF, and goutwho was admitted on 8/23/2018with shortness of breath x 3 days. Work up indicated hypoxic respiratory failure, and sepsis with a temperature of 104.6. Blood cultures positive for staph- not MRSA. Palliative consulted for GOC.   Assessment/Recommendations/Plan   Await results of swallow eval  PMT will continue to follow for progress and GOC, will f/u with family tomorrow after swallow eval results  Goals of Care and Additional Recommendations:  Limitations on Scope of Treatment: Full Scope Treatment  Code Status:  DNR  Prognosis:   Unable to determine  Discharge Planning:  To Be Determined  Care plan was discussed with patient's son, Molly Maduro.  Thank you for allowing the Palliative Medicine Team to assist in the care of this patient.   Time In: 1000 Time Out: 1035 Total Time 35 minutes Prolonged Time Billed No      Greater than 50%  of this time was spent counseling and coordinating  care related to the above assessment and plan.  Mariana Kaufman, AGNP-C Palliative Medicine   Please contact Palliative Medicine Team phone at  [phone] for questions and concerns.

## 2016-11-17 NOTE — Progress Notes (Signed)
Made Dr. Nemiah Commander aware of 14 beat run of [facility].  No new orders at this time.  Orson Ape, RN

## 2016-11-17 NOTE — Care Management (Signed)
This RNCM has asked Barbara Cower with [facility] to discuss cost of home IV antibiotics with patient's son Sharyon Medicus- he said he would. There will be labs needed to follow kidney function and son will allow home health nurse to collect. Per SLP patient was able to start diet. Patient pending PICC placement pending cultures. RNCM will continue to follow.

## 2016-11-17 NOTE — Clinical Social Work Note (Signed)
CSW received referral for SNF.  Case discussed with case manager and plan is to discharge home with home health.  CSW to sign off please re-consult if social work needs arise.  Ahtziri Jeffries R. Shasha Buchbinder, MSW, LCSWA [phone]  

## 2016-11-17 NOTE — Progress Notes (Addendum)
[facility] [place] at [facility]   PATIENT NAME: Jasmine Briggs    MR#:  [id number]  DATE OF BIRTH:  1920/10/19  SUBJECTIVE:  CHIEF COMPLAINT:   Chief Complaint  Patient presents with  . Loss of Consciousness  . Code Sepsis   - Patient is able to mumble some words today that is able to understand. Not following commands. Unable to eat due to her mental status  REVIEW OF SYSTEMS:  Review of Systems  Unable to perform ROS: Patient unresponsive    DRUG ALLERGIES:  No Known Allergies  VITALS:  Blood pressure (!) 146/64, pulse 69, temperature 98 F (36.7 C), resp. rate 18, height 5\' 2"  (1.575 m), weight 45.3 kg (99 lb 13.9 oz), SpO2 98 %.  PHYSICAL EXAMINATION:  Physical Exam  GENERAL:  [name] lying in the bed with no acute distress.  EYES: Pupils equal, round, reactive to light and accommodation. No scleral icterus. Extraocular muscles intact.  HEENT: Head atraumatic, normocephalic. Oropharynx and nasopharynx clear. Temporal wasting is noted NECK:  Supple, no jugular venous distention. No thyroid enlargement, no tenderness.  LUNGS: Normal breath sounds bilaterally, no wheezing, rales or crepitation. No use of accessory muscles of respiration. Decreased bibasilar breath sounds. Upper airway rhonchi noted CARDIOVASCULAR: S1, S2 normal. No murmurs, rubs, or gallops. Right chest pacemaker in place without any swelling or erythema or tenderness. ABDOMEN: Soft, nontender, nondistended. Bowel sounds present. No organomegaly or mass.  EXTREMITIES: No pedal edema, cyanosis, or clubbing.  NEUROLOGIC: Appears alert today, not following commands. Moving upper body bed. PSYCHIATRIC: The patient is alert but not oriented, able to mumble some words today SKIN: Has a stage III sacral decubitus ulcer, present on admission   LABORATORY PANEL:   CBC  Recent Labs Lab 11/17/16 0519  WBC 11.8*  HGB 9.9*  HCT 30.0*  PLT 140*    ------------------------------------------------------------------------------------------------------------------  Chemistries   Recent Labs Lab 11/12/16 1008  11/16/16 2246 11/17/16 0519  NA 161*  < > 142 143  K 4.0  < >  --  3.2*  CL >130*  < >  --  119*  CO2 15*  < >  --  20*  GLUCOSE 197*  < >  --  96  BUN 38*  < >  --  11  CREATININE 1.77*  < >  --  0.70  CALCIUM 9.7  < >  --  8.4*  MG  --   --  1.8  --   AST 27  --   --   --   ALT 11*  --   --   --   ALKPHOS 42  --   --   --   BILITOT 1.3*  --   --   --   < > = values in this interval not displayed. ------------------------------------------------------------------------------------------------------------------  Cardiac Enzymes  Recent Labs Lab 11/12/16 1008  TROPONINI 0.34*   ------------------------------------------------------------------------------------------------------------------  RADIOLOGY:  No results found.  EKG:   Orders placed or performed during the hospital encounter of 11/12/16  . EKG 12-Lead  . EKG 12-Lead  . EKG 12-Lead  . EKG 12-Lead    ASSESSMENT AND PLAN:   83 year old female with known history of dementia, bedbound status, arthritis, diabetes, hypertension and a sacral wound with osteomyelitis presents to hospital secondary to poor oral intake and decreased responsiveness.  #1 severe hypernatremia and hyperchloremia-secondary to free water deficit. -on D5 1/2 ns at this time. Monitor sodium. Hypokalemia-replaced.  #2  acute metabolic encephalopathy-worse than baseline. At baseline she is able to sit and converse with family. -Secondary to her hypernatremia and sepsis- though labs have improved, clinically -No significant improvement noted so far. But have been notified that she occasionally answers to family  #3 sepsis-secondary to gram-positive cocci bacteremia- ?  coagulase negative staph, BCID with oxacillin resistance -Secondary to her decub ulcer -Currently on  vancomycin IV. Appreciate ID consult. -WBC is improving. -Due to repeat cultures being positive for staphylococcus again, possibility of pacemaker infection is likely. -Transthoracic Echocardiogram negative for endocarditis. Repeat blood cultures have been ordered. -PICC line to be placed after the blood cultures from 11/16/2016 are negative -Family opted for empiric 6 week treatment with vancomycin instead of TEE and removal of pacemaker. Oral rifampin recommended as well  #4 severe protein calorie malnutrition-with significant temporal wasting noted. Poor prognostic sign. Speech to evaluate swallowing again today. Continue fluids  #5 acute bronchitis-on antibiotics. Unable to clear secretions. Follow-up chest x-ray with an infiltrate consistent with pneumonia  #6 acute renal failure on CK D stage III-monitor while on IV fluids  #7 DVT prophylaxis heparin subcutaneous heparin  Palliative care consult is appreciated. Agree with poor prognosis. .    All the records are reviewed and case discussed with Care Management/Social Workerr. Management plans discussed with the patient, family and they are in agreement.  CODE STATUS: DNR  TOTAL TIME TAKING CARE OF THIS PATIENT: 36 minutes.   POSSIBLE D/C IN 2-3 DAYS, DEPENDING ON CLINICAL CONDITION.   Mollye Guinta M.D on 11/17/2016 at 9:00 AM  Between 7am to 6pm - Pager - [phone]  After 6pm go to [email or web address] - Social research officer, government  [facility]  Office  [phone]  CC: Primary care physician; Kurtis Bushman, MD

## 2016-11-17 NOTE — Evaluation (Signed)
Clinical/Bedside Swallow Evaluation Patient Details  Name: Jasmine Briggs MRN: [id number] Date of Birth: 06-Nov-1920  Today's Date: 11/17/2016 Time: SLP Start Time (ACUTE ONLY): 1015 SLP Stop Time (ACUTE ONLY): 1100 SLP Time Calculation (min) (ACUTE ONLY): 45 min  Past Medical History:  Past Medical History:  Diagnosis Date  . Arthritis   . Dementia   . Diabetes mellitus without complication (HCC)   . Gout   . Hypertension   . Osteomyelitis (HCC)    sacral wound   Past Surgical History:  Past Surgical History:  Procedure Laterality Date  . CARDIAC SURGERY    . PACEMAKER INSERTION    . PICC line placement     HPI:  83 y.o. female  with past medical history of sacral osteomyelitis, dementia, pacemaker placement, CHF, and gout who was admitted on 11/12/2016 with shortness of breath x 3 days. Work up indicated hypoxic respiratory failure, and sepsis with a temperature of 104.6. Blood cultures are positive for MRSA. Pt is known to this service. She received evaluation and was recommended to be on a dysphagia diet including purees and Nectar consistency liquids. Family has taken care of pt at home in the past year.    Assessment / Plan / Recommendation Clinical Impression  Pt presented with moderate+ oropharyngeal dysphagia w/ the modified diet consistency given and is at increased risk for aspiration d/t her baseline of Dysphagia, declined Cognitive status baseline, and lengthy illness(declined medical status overall). Pt is a little more arousable today and was attempting to speak/mumble w/ family and ST when spoken to. Pt also able to attend to SLP's instructions and tactile cues during TSP presentation of po trials; pt is legally blind. Pt was given TSP trials of puree and Honey consistency liquids (at baseline she is on Nectar consistency liquids at home) and required total assist w/PO intake. Pt consumed po trials given w/ Oral phase deficits exhibited c/b prolonged oral  manipulation(chewing, munching of trials) and A-P transit. Given time and verbal cues by family, pt swallowed and cleared evidenced by full oral clearing/no bolus residue. SLP does suspect delayed pharyngeal swallow initiation d/t overall swallowing presentation. Pt required full feeding support and verbal/tactile cues; time w/ each trial. Overall, no immediate, overt s/s of aspiration were noted following the swallow. Pt is at increased risk for aspiration and pulmonary impact d/t oropharyngeal dysphagia, illness, and declined cognitive status. When asked, family stated pt's presentation w/ oral/po trials today appears similar to her baseline at home w/ po's. MD consulted and agreed w/ initiation of an oral diet; Dysphagia level 1 w/ Honey consistenyc liquids; strict aspiration precautions. ST services will f/u w/ education and toleration of diet. NSG updated. Addendum: this evaluation was repeated d/t pt's NPO status and new change in status; alertness. SLP Visit Diagnosis: Dysphagia, oropharyngeal phase (R13.12)    Aspiration Risk  Moderate aspiration risk (w/ modified diet)    Diet Recommendation  Dysphagia level 1 (puree) w/ Honey consistency liquids; strict aspiration precautions; feeding support at all meals  Medication Administration: Crushed with puree    Other  Recommendations Recommended Consults:  (Dietician f/u) Oral Care Recommendations: Oral care BID;Staff/trained caregiver to provide oral care Other Recommendations: Order thickener from pharmacy;Prohibited food (jello, ice cream, thin soups);Remove water pitcher;Have oral suction available   Follow up Recommendations Home health SLP (TBD)      Frequency and Duration min 3x week  2 weeks       Prognosis Prognosis for Safe Diet Advancement: Guarded Barriers to Reach Goals:  Cognitive deficits;Severity of deficits Barriers/Prognosis Comment: pt would best benefit from a more modified liquid consistency for safety      Swallow  Study   General Date of Onset: 11/12/16 HPI: 83 y.o. female  with past medical history of sacral osteomyelitis, dementia, pacemaker placement, CHF, and gout who was admitted on 11/12/2016 with shortness of breath x 3 days. Work up indicated hypoxic respiratory failure, and sepsis with a temperature of 104.6. Blood cultures are positive for MRSA. Pt is known to this service. She received evaluation and was recommended to be on a dysphagia diet including purees and Nectar consistency liquids. Family has taken care of pt at home in the past year.  Type of Study: Bedside Swallow Evaluation Previous Swallow Assessment: Pt is on Dysphagia I diet w/nectar thick liquids at baseline.  Diet Prior to this Study: NPO (d/t medical status since admission) Temperature Spikes Noted: No (wbc trending down) Respiratory Status: Nasal cannula (2 liters) History of Recent Intubation: No Behavior/Cognition: Alert;Cooperative;Pleasant mood;Confused;Distractible;Requires cueing Oral Cavity Assessment: Dry;Dried secretions (min) Oral Care Completed by SLP: Yes Oral Cavity - Dentition: Poor condition;Missing dentition (many ) Vision: Impaired for self-feeding (legally blind baseline) Self-Feeding Abilities: Total assist Patient Positioning: Upright in bed Baseline Vocal Quality: Low vocal intensity (mumbled speech) Volitional Cough: Congested (adequate) Volitional Swallow: Unable to elicit    Oral/Motor/Sensory Function Overall Oral Motor/Sensory Function:  (unable to assess formally; informally appeared wfl w/ po's)   Ice Chips Ice chips: Not tested   Thin Liquid Thin Liquid: Not tested    Nectar Thick Nectar Thick Liquid: Not tested   Honey Thick Honey Thick Liquid: Impaired Presentation: Spoon (9 trials; fed) Oral Phase Impairments: Poor awareness of bolus (min) Oral Phase Functional Implications: Prolonged oral transit (min increased lingual munching/movements) Pharyngeal Phase Impairments: Suspected delayed  Swallow (none other) Other Comments: pt given verbal cues by family/ST; tactile/verbal cues upon presentation of tsp boluses d/t vision deficits   Puree Puree: Impaired Presentation: Spoon (fed; 6 trials) Oral Phase Impairments: Poor awareness of bolus;Impaired mastication (min) Oral Phase Functional Implications: Prolonged oral transit (increased munching/lingual movements) Pharyngeal Phase Impairments: Suspected delayed Swallow (none other) Other Comments: similar cues as w/ liquids   Solid   GO   Solid: Not tested        Jerilynn Som, MS, CCC-SLP Watson,Katherine 11/17/2016,11:30 AM

## 2016-11-17 NOTE — Care Management (Signed)
This RNCM contacted patient's son Sharyon Medicus this morning and he agrees to [facility] but doesn't feel that he will need a nurse. He has administered IV antibiotic to this patient on other occassions. He denies needing a home health nurse for wound care.  I have requested Barbara Cower with [facility] to review case and charges for potential home IV antibiotics. RNCM will continue to follow.

## 2016-11-18 LAB — BASIC METABOLIC PANEL
ANION GAP: 7 (ref 5–15)
BUN: 9 mg/dL (ref 6–20)
CALCIUM: 8.6 mg/dL — AB (ref 8.9–10.3)
CHLORIDE: 118 mmol/L — AB (ref 101–111)
CO2: 18 mmol/L — AB (ref 22–32)
Creatinine, Ser: 0.56 mg/dL (ref 0.44–1.00)
GFR calc non Af Amer: >60 mL/min (ref >60)
Glucose, Bld: 105 mg/dL — ABNORMAL HIGH (ref 65–99)
Potassium: 3.5 mmol/L (ref 3.5–5.1)
Sodium: 143 mmol/L (ref 135–145)

## 2016-11-18 LAB — GLUCOSE, CAPILLARY
GLUCOSE-CAPILLARY: 98 mg/dL (ref 65–99)
GLUCOSE-CAPILLARY: 149 mg/dL — AB (ref 65–99)
Glucose-Capillary: 114 mg/dL — ABNORMAL HIGH (ref 65–99)
Glucose-Capillary: 144 mg/dL — ABNORMAL HIGH (ref 65–99)

## 2016-11-18 LAB — CULTURE, BLOOD (ROUTINE X 2)
CULTURE: NO GROWTH
SPECIAL REQUESTS: ADEQUATE

## 2016-11-18 NOTE — Progress Notes (Addendum)
[facility] - [place] at [facility]   PATIENT NAME: Jeanella CaraSarah Toepfer    MR#:  [id number]  DATE OF BIRTH:  [birth year]  SUBJECTIVE:  CHIEF COMPLAINT:   Chief Complaint  Patient presents with  . Loss of Consciousness  . Code Sepsis   -patient being fed by family, has thick secretions in throat, poor cough - alert but not communicating today  REVIEW OF SYSTEMS:  Review of Systems  Unable to perform ROS: Dementia    DRUG ALLERGIES:  No Known Allergies  VITALS:  Blood pressure (!) 163/78, pulse 69, temperature 97.6 F (36.4 C), resp. rate 14, height 5\' 2"  (1.575 m), weight 48 kg (105 lb 14.4 oz), SpO2 100 %.  PHYSICAL EXAMINATION:  Physical Exam  GENERAL:  [name] lying in the bed with no acute distress.  EYES: Pupils equal, round, reactive to light and accommodation. No scleral icterus. Extraocular muscles intact.  HEENT: Head atraumatic, normocephalic. Oropharynx and nasopharynx clear. Temporal wasting is noted NECK:  Supple, no jugular venous distention. No thyroid enlargement, no tenderness.  LUNGS: Normal breath sounds bilaterally, no wheezing, rales or crepitation. No use of accessory muscles of respiration. Decreased bibasilar breath sounds. Upper airway rhonchi noted. Also gurgling secretions in throat CARDIOVASCULAR: S1, S2 normal. No murmurs, rubs, or gallops. Right chest pacemaker in place without any swelling or erythema or tenderness. ABDOMEN: Soft, nontender, nondistended. Bowel sounds present. No organomegaly or mass.  EXTREMITIES: No pedal edema, cyanosis, or clubbing.  NEUROLOGIC: Appears alert today, not following commands. Moving upper body in bed. PSYCHIATRIC: The patient is alert but not oriented, able to mumble words  SKIN: Has a stage III sacral decubitus ulcer, present on admission   LABORATORY PANEL:   CBC  Recent Labs Lab 11/17/16 0519  WBC 11.8*  HGB 9.9*  HCT 30.0*  PLT 140*    ------------------------------------------------------------------------------------------------------------------  Chemistries   Recent Labs Lab 11/12/16 1008  11/16/16 2246  11/18/16 0541  NA 161*  < > 142  < > 143  K 4.0  < >  --   < > 3.5  CL >130*  < >  --   < > 118*  CO2 15*  < >  --   < > 18*  GLUCOSE 197*  < >  --   < > 105*  BUN 38*  < >  --   < > 9  CREATININE 1.77*  < >  --   < > 0.56  CALCIUM 9.7  < >  --   < > 8.6*  MG  --   --  1.8  --   --   AST 27  --   --   --   --   ALT 11*  --   --   --   --   ALKPHOS 42  --   --   --   --   BILITOT 1.3*  --   --   --   --   < > = values in this interval not displayed. ------------------------------------------------------------------------------------------------------------------  Cardiac Enzymes  Recent Labs Lab 11/12/16 1008  TROPONINI 0.34*   ------------------------------------------------------------------------------------------------------------------  RADIOLOGY:  No results found.  EKG:   Orders placed or performed during the hospital encounter of 11/12/16  . EKG 12-Lead  . EKG 12-Lead  . EKG 12-Lead  . EKG 12-Lead    ASSESSMENT AND PLAN:   83 year old female with known history of dementia, bedbound status, arthritis, diabetes, hypertension and a  sacral wound with osteomyelitis presents to hospital secondary to poor oral intake and decreased responsiveness.  #1 severe hypernatremia and hyperchloremia-secondary to free water deficit. -on D5 1/2 ns at this time. Monitor sodium. Hypokalemia-replaced.  #2 acute metabolic encephalopathy-worse than baseline. At baseline she is able to sit and converse with family. -Secondary to her hypernatremia and sepsis- -No significant improvement noted clinically. - able to eat now when fed, mostly eyes closed and interacts with family.  #3 sepsis-secondary to gram-positive cocci bacteremia-  coagulase negative staph, BCID with oxacillin resistance -Secondary  to her decub ulcer and skin issues, since repeat cultures 1/2 were positive again- concern for pacemaker infection -Currently on vancomycin IV and oral rifampin. Appreciate ID consult. -WBC is improved. -Transthoracic Echocardiogram negative for endocarditis. Repeat blood cultures from 11/16/16 are negative so far for 48hrs . -PICC line to be placed today -Family opted for empiric 6 week treatment with ABX instead of TEE and removal of pacemaker.  #4 severe protein calorie malnutrition-with significant temporal wasting noted. Poor prognostic sign. On honey thick liquids and puree diet High risk for aspiration  #5 acute renal failure on CK D stage III-monitor while on IV fluids  #7 DVT prophylaxis heparin subcutaneous heparin  Palliative care consult is appreciated. Agree with poor prognosis. . Discussed with daughter at bedside Family refusing rehab and home health- will need home health RN at discharge for ABX and blood draws at home  HIGH RISK FOR READMISSION    All the records are reviewed and case discussed with Care Management/Social Workerr. Management plans discussed with the patient, family and they are in agreement.  CODE STATUS: DNR  TOTAL TIME TAKING CARE OF THIS PATIENT: 36 minutes.   POSSIBLE D/C TOMORROW, DEPENDING ON CLINICAL CONDITION.   Vonnetta Akey M.D on 11/18/2016 at 12:27 PM  Between 7am to 6pm - Pager - [phone]  After 6pm go to [email or web address] - Social research officer, government  [facility] [place] [facility]  Office  [phone]  CC: Primary care physician; Kurtis Bushman, MD

## 2016-11-18 NOTE — Care Management (Signed)
Anticipating discharge within next 24 hours with iv antibiotic therapy.  [facility] will provide the medication but will not able able to provide the nursing services.  It was reported to this CM that family does not want any home health nursing service.  Have reached out to patient's son Marcy SalvoRaymond and left message to discuss.  Patient will need nursing assessment and labs  related to the iv antibiotics.  [facility]. Faxed IV referral information to Brandi at Dr Jarrett AblesFitzgerald's office

## 2016-11-18 NOTE — Progress Notes (Addendum)
Daily Progress Note   Patient Name: Jasmine Briggs       Date: 11/18/2016 DOB: [birth year]  Age: 83 y.o. MRN#: [id number] Attending Physician: Enid Baas, MD Primary Care Physician: Kurtis Bushman, MD Admit Date: 11/12/2016  Reason for Consultation/Follow-up: Establishing goals of care  Subjective: Patient in bed. Has taken small bites of lunch today. Lethargic. Not as conversive as yesterday. Patient's daughter- Jasmine Briggs at bedside- just finishing phone conversation with Jasmine Briggs.  Jasmine Briggs tells me that they are frustrated with everyone "continuing to ask them questions and tell them the same thing". They want to take their mother home and give her antibiotics. She says if the nurse has to come give her antibiotics then that that is ok.  Jasmine Briggs tells me that Ms. Madysun has always said that when it is her time to go, she will clos her eyes and God will take her. Until then she wants whatever medical treatments are offered that will prolong her life. We discussed the difference between improving quality and and improving quantity. Jasmine Briggs is aware that patient going home with IV antibiotics will likely increase quantity of life, but is not likely to increase quality or function. Additionally, she is likely to develop altered mental status again and sepsis again r/t pacemaker infection and aspiration pneumonia.  Patient's granddaughter is at bedside and also verbalizes understanding of above conversation.  Hospice and Palliative services again described and offered.  Per Jasmine Briggs- all siblings decline Hospice or Palliative services at home. Decisions are made to bring patient home with PICC line and IV antibiotics.   ROS  Length of Stay: 6  Current Medications: Scheduled Meds:  . chlorhexidine  15  mL Mouth Rinse BID  . docusate sodium  100 mg Oral BID  . heparin  5,000 Units Subcutaneous Q8H  . insulin aspart  0-9 Units Subcutaneous Q6H  . ipratropium-albuterol  3 mL Nebulization Q6H  . mouth rinse  15 mL Mouth Rinse q12n4p  . rifampin  300 mg Oral Q12H    Continuous Infusions: . dextrose 5 % and 0.45 % NaCl with KCl 20 mEq/L 60 mL/hr at 11/18/16 1531  . vancomycin Stopped (11/18/16 0008)    PRN Meds: acetaminophen **OR** acetaminophen, bisacodyl, ondansetron **OR** ondansetron (ZOFRAN) IV  Physical Exam          Vital  Signs: BP (!) 163/78 (BP Location: Left Arm)   Pulse 69   Temp 97.6 F (36.4 C)   Resp 14   Ht 5\' 2"  (1.575 m)   Wt 48 kg (105 lb 14.4 oz)   SpO2 100%   BMI 19.37 kg/m  SpO2: SpO2: 100 % O2 Device: O2 Device: Nasal Cannula O2 Flow Rate: O2 Flow Rate (L/min): 2 L/min  Intake/output summary:  Intake/Output Summary (Last 24 hours) at 11/18/16 1533 Last data filed at 11/17/16 2100  Gross per 24 hour  Intake                0 ml  Output              600 ml  Net             -600 ml   LBM: Last BM Date: 11/16/16 Baseline Weight: Weight: 40.8 kg (90 lb) Most recent weight: Weight: 48 kg (105 lb 14.4 oz)       Palliative Assessment/Data: PPS: 20%   Flowsheet Rows     Most Recent Value  Intake Tab  Referral Department  Hospitalist  Palliative Care Primary Diagnosis  Sepsis/Infectious Disease  Palliative Care Type  New Palliative care  Reason for referral  Clarify Goals of Care, Psychosocial or Spiritual support  Date of Admission  11/12/16  Date first seen by Palliative Care  11/13/16  Clinical Assessment  Palliative Performance Scale Score  20%  Psychosocial & Spiritual Assessment  Palliative Care Outcomes  Patient/Family meeting held?  Yes  Who was at the meeting?  Jasmine Briggs, and two dtrs with patient.  Palliative Care Outcomes  Clarified goals of care      Patient Active Problem List   Diagnosis Date Noted  . Advance care planning   .  Goals of care, counseling/discussion   . Encephalopathy   . Protein-calorie malnutrition, severe 11/13/2016  . Acute respiratory failure with hypoxia (HCC)   . MRSA bacteremia   . Palliative care encounter   . Sepsis (HCC) 11/12/2016  . Decubitus ulcer of sacral region, stage 3 (HCC) 07/26/2015  . Acute on chronic renal failure (HCC) 07/26/2015  . Hypernatremia 07/26/2015  . Hypokalemia 07/26/2015  . Encephalopathy, metabolic 07/26/2015  . Acute posthemorrhagic anemia 07/26/2015  . Gross hematuria 07/26/2015  . Generalized weakness 07/26/2015  . Dysphagia 07/26/2015  . Moderate malnutrition (HCC) 07/26/2015  . Failure to thrive in adult 07/26/2015  . Dementia 07/10/2015  . Hypotension 07/10/2015  . Acute CHF (congestive heart failure) (HCC) 07/10/2015  . Anasarca 07/10/2015  . Type 2 diabetes mellitus (HCC) 07/10/2015  . Aspiration pneumonia (HCC) 06/21/2015  . Pressure ulcer 06/14/2015  . Altered mental status 06/12/2015    Palliative Care Assessment & Plan   Patient Profile: 83 y.o.femalewith past medical history of sacral osteomyelitis, dementia, pacemaker placement, CHF, and goutwho was admitted on 8/23/2018with shortness of breath x 3 days. Work up indicated hypoxic respiratory failure, and sepsis with a temperature of 104.6. Blood cultures positive for staph- not MRSA. Source is likely pacemaker infection. Palliative consulted for GOC.   Assessment/Recommendations/Plan   D/C patient home with PICC line, home antibiotics and home health  Goals of Care and Additional Recommendations:  Limitations on Scope of Treatment: Full Scope Treatment  Code Status:  DNR  Prognosis:   < 3 months d/t likely recurrent infection, poor po intake, future aspiration  Discharge Planning:  Home with Home Health  Care plan was discussed with Dr. Nemiah CommanderKalisetti, patient's daughter- Jasmine Briggs.  Thank you for allowing the Palliative Medicine Team to assist in the care of this  patient.   Time In: 1400 Time Out: 1435 Total Time 35 minutes Prolonged Time Billed No      Greater than 50%  of this time was spent counseling and coordinating care related to the above assessment and plan.  Ocie Bob, AGNP-C Palliative Medicine   Please contact Palliative Medicine Team phone at [phone] for questions and concerns.

## 2016-11-18 NOTE — Progress Notes (Signed)
PHARMACY CONSULT NOTE FOR:  OUTPATIENT  PARENTERAL ANTIBIOTIC THERAPY (OPAT)  Indication: coag neg staph bacteremia Regimen: vancomycin 500mg  q 24hr End date: 12/28/16  IV antibiotic discharge orders are pended. To discharging provider:  please sign these orders via discharge navigator,  Select New Orders & click on the button choice - Manage This Unsigned Work.     Thank you for allowing pharmacy to be a part of this patient's care.  Olene FlossMelissa D Urian Martenson 11/18/2016, 2:49 PM

## 2016-11-19 ENCOUNTER — Inpatient Hospital Stay: Payer: Medicare Other

## 2016-11-19 ENCOUNTER — Encounter: Payer: Self-pay | Admitting: Interventional Radiology

## 2016-11-19 HISTORY — PX: IR FLUORO GUIDE CV LINE LEFT: IMG2282

## 2016-11-19 LAB — GLUCOSE, CAPILLARY
GLUCOSE-CAPILLARY: 130 mg/dL — AB (ref 65–99)
GLUCOSE-CAPILLARY: 121 mg/dL — AB (ref 65–99)
Glucose-Capillary: 108 mg/dL — ABNORMAL HIGH (ref 65–99)
Glucose-Capillary: 116 mg/dL — ABNORMAL HIGH (ref 65–99)
Glucose-Capillary: 138 mg/dL — ABNORMAL HIGH (ref 65–99)

## 2016-11-19 LAB — VANCOMYCIN, TROUGH: Vancomycin Tr: 13 ug/mL — ABNORMAL LOW (ref 15–20)

## 2016-11-19 MED ORDER — VANCOMYCIN HCL 500 MG IV SOLR: 500.0000 mg | INTRAVENOUS | 0 refills | Status: DC

## 2016-11-19 MED ORDER — VANCOMYCIN IV (FOR PTA / DISCHARGE USE ONLY): 500.0000 mg | INTRAVENOUS | 0 refills | Status: DC

## 2016-11-19 MED ORDER — HEPARIN SOD (PORK) LOCK FLUSH 100 UNIT/ML IV SOLN
INTRAVENOUS | Status: AC
  Filled 2016-11-19: qty 5

## 2016-11-19 MED ORDER — RIFAMPIN 300 MG PO CAPS: 300.0000 mg | ORAL_CAPSULE | Freq: Two times a day (BID) | ORAL | 0 refills | Status: DC

## 2016-11-19 MED ORDER — IPRATROPIUM-ALBUTEROL 0.5-2.5 (3) MG/3ML IN SOLN: 3.0000 mL | Freq: Four times a day (QID) | RESPIRATORY_TRACT | 0 refills | Status: DC | PRN

## 2016-11-19 NOTE — Care Management Important Message (Signed)
Important Message  Patient Details  Name: Jeanella CaraSarah Proffit MRN: [id number] Date of Birth: 04/16/1920   Medicare Important Message Given:  Yes Signed IM notice given    Eber HongGreene, Paulla Mcclaskey R, RN 11/19/2016, 3:25 PM

## 2016-11-19 NOTE — Progress Notes (Signed)
Peripherally Inserted Central Catheter/Midline Placement  The IV Nurse has discussed with the patient and/or persons authorized to consent for the patient, the purpose of this procedure and the potential benefits and risks involved with this procedure.  The benefits include less needle sticks, lab draws from the catheter, and the patient may be discharged home with the catheter. Risks include, but not limited to, infection, bleeding, blood clot (thrombus formation), and puncture of an artery; nerve damage and irregular heartbeat and possibility to perform a PICC exchange if needed/ordered by physician.  Alternatives to this procedure were also discussed.  Bard Power PICC patient education guide, fact sheet on infection prevention and patient information card has been provided to patient /or left at bedside.    PICC/Midline Placement Documentation        Lisabeth DevoidGibbs, Jumaane Weatherford Jeanette 11/19/2016, 10:42 AM Phone consent obtained from Urbano HeirSon Ramond Krahenbuhl

## 2016-11-19 NOTE — Care Management (Addendum)
Spoke with patient's sone Ramond. Patient will discharge home after PICC line placed by interventional radiology who is not planning for any problems. he is agreeable to have home health nurse.  Discussed medicare criteria for oxygen- that there is a lack of a chronic diagnosis  and qualifying oxygen sats.  Patient at present does not meet criteria for medicare reimbursed oxygen but if wished to have home oxygen could pay out of pocket.  Awaiting call back.   Discussed home nebulizer and neb treatments with attending and in agreement and this discussed with Ramond.  Spoke with attending and patient's next dose IV Vancomycin can be given in the morning rather than at midnight. Notified [facility] of discharge and the need for home nebulizer. Patient to transport via ems. Notified [facility] that discharge will be today and attending will enter order for home health nurse. Prepared EMS packet and discussed placing the DNR form in this packet with primary nurse

## 2016-11-19 NOTE — Progress Notes (Signed)
Nutrition Follow-up  DOCUMENTATION CODES:   Severe malnutrition in context of chronic illness  INTERVENTION:  1. Continue Mighty Shake II TID, Magic Cup TID 2. Monitor for GOC, family declining hospice/palliative at this time  NUTRITION DIAGNOSIS:   Malnutrition related to chronic illness (severe dementia) as evidenced by severe depletion of body fat, severe depletion of muscle mass, percent weight loss  -ongoing  GOAL:   Patient will meet greater than or equal to 90% of their needs -not meeting  MONITOR:   PO intake, Supplement acceptance, Labs, Weight trends, Skin  REASON FOR ASSESSMENT:   Other (Comment) (low BMI)    ASSESSMENT:   83 y.o. female  with past medical history of sacral osteomyelitis, dementia, pacemaker placement, CHF, and gout who was admitted on 11/12/2016 with shortness of breath x 3 days. Work up indicated hypoxic respiratory failure, and sepsis with a temperature of 104.6. Blood cultures are positive for MRSA.  Being fed by family. Ate 15% of Pureed roast beef, whipped potatoes, gravy, milk yesterday. Unable to communicate. Family refusing hospice/palliative. Will need to go home with PICC line, unable to place at bedside will be done by IR. High risk for aspiration. Poor prognosis.  Labs reviewed. Medications reviewed and include:  Colace, Novolog 0-9 Units every 6 hours D5 1/2 NS 20K+ at 5560mL/hr --> 245 calories   Diet Order:  DIET - DYS 1 Room service appropriate? Yes with Assist; Fluid consistency: Honey Thick Diet general  Skin:  Wound (see comment) (Stage III coccyx)  Last BM:  PTA  Height:   Ht Readings from Last 1 Encounters:  11/12/16 5\' 2"  (1.575 m)    Weight:   Wt Readings from Last 1 Encounters:  11/19/16 107 lb 4.8 oz (48.7 kg)    Ideal Body Weight:  50 kg  BMI:  Body mass index is 19.63 kg/m.  Estimated Nutritional Needs:   Kcal:  1500-1800kcal/day   Protein:  81-89g/day   Fluid:  >1.5L/day   EDUCATION NEEDS:    Education needs no appropriate at this time  Dionne AnoWilliam M. Malcome Ambrocio, MS, RD LDN Inpatient Clinical Dietitian Pager [phone]

## 2016-11-19 NOTE — Care Management (Signed)
[facility] informed that [facility] could no longer accept the referral due to PICC line to be place tomorrow, no definite start of care and that a home visit would have to to be made 12 AM Saturday  morning.  [facility]formed [facility] that none of this information is true- PICC was placed today by interventional radiology as discussed earlier today, the first home visit would be Saturday morning 8-10ish. [facility] still declined.  CM notified [facility] and tried to figure out how such miscommunication occurred.  Discharge was canceled.   Cm called all local home health agencies and [facility] accepted referral.  Informed agency that family only accepting home health nurse because "they have to for troughs" and family does not want everyone trying to talk them into a plan other than patient discharge home with her IV antibiotics.  They are caring for the decubitus and have been for the past year. DNR in place. Need for first home health visit 9/1.  Notified [facility].  Notified Jasmine Briggs that medicare would not cover the home nebulizer for diagnosis of aspiration pneumonia but CM would make sure they received a home nebulizer.

## 2016-11-19 NOTE — Care Management (Signed)
3 attempts made to place PICC and unsuccessful.  Interventional radiology will be asked to attempt.  Unable to use left chest due to pacemaker.

## 2016-11-19 NOTE — Care Management (Signed)
Have left another message with son Marcy SalvoRaymond to contact CM to discuss the home health nurse to manage aspects of IVT.

## 2016-11-19 NOTE — Progress Notes (Signed)
IV Team attempted PICC insertion via the left basilic and brachial veins without success.  Unable to thread the guidewires.  Due to AICD/pacer on right unable to utilize right arm.  Primary RN made aware.

## 2016-11-19 NOTE — Progress Notes (Signed)
  Speech Language Pathology Treatment: Dysphagia  Patient Details Name: Jasmine Briggs MRN: [id number] DOB: 06-14-1920 Today's Date: 11/19/2016 Time: [id number] SLP Time Calculation (min) (ACUTE ONLY): 25 min  Assessment / Plan / Recommendation Clinical Impression  Pt continues to present with moderate+ oropharyngeal dysphagia w/ the modified diet consistency given and is at increased risk for aspiration d/t her baseline of Dysphagia, declined Cognitive status baseline, and lengthy illness(declined medical status overall). Pt more arousable this morning. Tactile cues given during TSP presentation of po trials; pt is legally blind. Pt consumed TSP trials of puree and Honey consistency liquids (at baseline she is on Nectar consistency liquids at home) and required total assist w/PO intake. Pt consumed po trials given w/ min Oral phase deficits exhibited c/b min prolonged oral manipulation(chewing, munching of trials) and A-P transit. Given time and verbal cues, pt swallowed and cleared evidenced by full oral clearing/no bolus residue. SLP does suspect delayed pharyngeal swallow initiation d/t overall swallowing presentation. Pt required full feeding support and verbal/tactile cues; time w/ each trial. Overall, no immediate, overt s/s of aspiration were noted following the swallow. Pt is at increased risk for aspiration and pulmonary impact d/t oropharyngeal dysphagia, illness, and declined cognitive status. Recommend continue Dysphagia level 1 w/ Honey consisteny liquids diet; strict aspiration precautions; feeding support. ST services will f/u w/ education and toleration of diet as needed while admitted; no recommendation to upgrade diet at this time. NSG updated.    HPI HPI: 83 y.o. female  with past medical history of sacral osteomyelitis, dementia, pacemaker placement, CHF, and gout who was admitted on 11/12/2016 with shortness of breath x 3 days. Work up indicated hypoxic respiratory failure, and sepsis with  a temperature of 104.6. Blood cultures are positive for MRSA. Pt is known to this service. She received evaluation and was recommended to be on a dysphagia diet including purees and Nectar consistency liquids. Family has taken care of pt at home in the past year.       SLP Plan  Continue with current plan of care       Recommendations  Diet recommendations: Dysphagia 1 (puree);Honey-thick liquid Liquids provided via: Teaspoon Medication Administration: Crushed with puree Supervision: Full supervision/cueing for compensatory strategies;Trained caregiver to feed patient Compensations: Minimize environmental distractions;Slow rate;Small sips/bites;Multiple dry swallows after each bite/sip;Follow solids with liquid Postural Changes and/or Swallow Maneuvers: Seated upright 90 degrees;Upright 30-60 min after meal                General recommendations:  (Dietician /u) Oral Care Recommendations: Oral care BID;Staff/trained caregiver to provide oral care Follow up Recommendations:  (TBD) SLP Visit Diagnosis: Dysphagia, oropharyngeal phase (R13.12) Plan: Continue with current plan of care       GO               Jasmine SomKatherine Arryana Tolleson, MS, CCC-SLP Jasmine Briggs 11/19/2016, 1:58 PM

## 2016-11-19 NOTE — Discharge Summary (Addendum)
[facility] NAME: Jasmine Briggs    MR#:  [id number]  DATE OF BIRTH:  Oct 30, 1920  DATE OF ADMISSION:  11/12/2016   ADMITTING PHYSICIAN: Epifanio Lesches, MD  DATE OF DISCHARGE:  11/19/2016  PRIMARY CARE PHYSICIAN: Caryl Never, MD   ADMISSION DIAGNOSIS:   Encephalopathy [G93.40] Acute respiratory failure with hypoxia ([place]) [J96.01] Sepsis, due to unspecified organism ([place]) [A41.9]  DISCHARGE DIAGNOSIS:   Active Problems:   Sepsis ([place])   Acute respiratory failure with hypoxia ([place])   MRSA bacteremia   Palliative care encounter   Protein-calorie malnutrition, severe   Encephalopathy   Advance care planning   Goals of care, counseling/discussion   SECONDARY DIAGNOSIS:   Past Medical History:  Diagnosis Date  . Arthritis   . Dementia   . Diabetes mellitus without complication ([place])   . Gout   . Hypertension   . Osteomyelitis ([place])    sacral wound    HOSPITAL COURSE:   83 year old female with known history of dementia, bedbound status, arthritis, diabetes, hypertension and a sacral wound with osteomyelitis presents to hospital secondary to poor oral intake and decreased responsiveness.  #1 severe hypernatremia and hyperchloremia-secondary to free water deficit. -received D5 1/2 ns. Sodium has improved, but at home, she will be back on a thickened liquid diet and this might happen again. Discussed with daughter and sons at bedside Hypokalemia-replaced.  #2 acute metabolic encephalopathy-worse than baseline. At baseline she is able to sit and converse with family. -Secondary to her hypernatremia and sepsis- -No significant improvement noted clinically. - able to eat now when fed, mostly eyes closed and interacts with family. However family very pleased with the improvement and wants to take her home. They have refused rehabilitation and home health  #3 sepsis-secondary to gram-positive cocci  bacteremia-  coagulase negative staph, BCID with oxacillin resistance -Secondary to her decub ulcer and skin issues, since repeat cultures 1/2 were positive again- concern for pacemaker infection -Currently on vancomycin IV and oral rifampin. Appreciate ID consult. -WBC is improved. -Transthoracic Echocardiogram negative for endocarditis. Repeat blood cultures from 11/16/16 are negative so far for 48hrs . -PICC line to be placed today. IV vancomycin will start in AM -Family opted for empiric 6 week treatment with ABX instead of TEE and removal of pacemaker.  #4 severe protein calorie malnutrition-with significant temporal wasting noted. Poor prognostic sign. On honey thick liquids and puree diet High risk for aspiration  #5 acute renal failure on CK D stage III-   Palliative care consult is appreciated. Agree with poor prognosis. . Family refusing rehab and home health- will need home health RN at discharge for ABX and blood draws at home  [place]:   Critical  CONSULTS OBTAINED:   Treatment Team:  Leonel Ramsay, MD  DRUG ALLERGIES:   No Known Allergies DISCHARGE MEDICATIONS:   Allergies as of 11/19/2016   No Known Allergies     Medication List    STOP taking these medications   torsemide 20 MG tablet Commonly known as:  DEMADEX     TAKE these medications   acetaminophen 500 MG tablet Commonly known as:  TYLENOL Take 500 mg by mouth every 6 (six) hours as needed for mild pain.   nicotine 7 mg/24hr patch Commonly known as:  NICODERM CQ - dosed in mg/24 hr Place 1 patch (7 mg total) onto the skin daily.   ondansetron 4  MG tablet Commonly known as:  ZOFRAN Take 1 tablet (4 mg total) by mouth every 6 (six) hours as needed for nausea.   potassium chloride 20 MEQ/15ML (10%) Soln Take 15 mLs (20 mEq total) by mouth daily.   rifampin 300 MG capsule Commonly known as:  RIFADIN Take 1 capsule (300 mg total) by  mouth every 12 (twelve) hours. X 6 weeks   vancomycin 500 mg in sodium chloride 0.9 % 100 mL Inject 500 mg into the vein daily. X 6 weeks   vancomycin IVPB Inject 500 mg into the vein daily. Indication:  Coag neg staph bacteremia Last Day of Therapy:  12/28/16 Labs - _0 /30/18 1126   11/19/16 0000  Activity as tolerated - No restrictions     11/19/16 1126   11/19/16 0000  Diet general     11/19/16 1126       DISCHARGE INSTRUCTIONS:   1. Home health 2. ID follow up in 2-3 weeks  DIET:   Puree diet with honey thick liquids  ACTIVITY:   Bedrest  OXYGEN:   Home Oxygen: No.  Oxygen Delivery: room air  DISCHARGE LOCATION:   home   If you experience worsening of your admission symptoms, develop shortness of breath, life threatening emergency, suicidal or homicidal thoughts you must seek medical attention immediately by calling 911 or calling your MD immediately  if symptoms less severe.  You Must read complete instructions/literature along with all the possible adverse reactions/side effects for all the Medicines you take and that have been prescribed to you. Take any new Medicines after you have completely understood and accpet all the possible adverse reactions/side effects.   Please note  You were cared  for by a hospitalist during your hospital stay. If you have any questions about your discharge medications or the care you received while you were in the hospital after you are discharged, you can call the unit and asked to speak with the hospitalist on call if the hospitalist that took care of you is not available. Once you are discharged, your primary care physician will handle any further medical issues. Please note that NO REFILLS for any discharge medications will be authorized once you are discharged, as it is imperative that you return to your primary care physician (or establish a relationship with a primary care physician if you do not have one) for your aftercare needs so that they can reassess your need for medications and monitor your lab values.    On the day of Discharge:  VITAL SIGNS:   Blood pressure (!) 147/59, pulse 70, temperature (!) 97.5 F (36.4 C), temperature source Oral, resp. rate 18, height _0  (1.575 m), weight 48.7 kg (107 lb 4.8 oz), SpO2 97 %.  PHYSICAL EXAMINATION:    GENERAL:  [name] lying in the bed with no acute distress.  EYES: Pupils equal, round, reactive to light and accommodation. No scleral icterus. Extraocular muscles intact.  HEENT: Head atraumatic, normocephalic. Oropharynx and nasopharynx clear. Temporal wasting is noted NECK:  Supple, no jugular venous distention. No thyroid enlargement, no tenderness.  LUNGS: Normal breath sounds bilaterally, no wheezing, rales or crepitation. No use of accessory muscles of respiration. Decreased bibasilar breath sounds. Upper airway rhonchi noted. Also gurgling secretions in throat CARDIOVASCULAR: S1, S2 normal. No murmurs, rubs, or gallops. Right chest pacemaker in place without any swelling or erythema or tenderness. ABDOMEN: Soft, nontender, nondistended. Bowel sounds present. No organomegaly or mass.  EXTREMITIES: No pedal edema, cyanosis, or clubbing.  NEUROLOGIC:  Appears alert today, not following commands. Moving upper body in bed. PSYCHIATRIC: The patient is alert but not oriented, able to mumble words  SKIN: Has a stage III sacral decubitus ulcer, present on admission    DATA REVIEW:   CBC  Recent Labs Lab 11/17/16 0519  WBC 11.8*  HGB 9.9*  HCT 30.0*  PLT 140*    Chemistries   Recent Labs Lab 11/16/16 2246  11/18/16 0541  NA 142  < > 143  K  --   < > 3.5  CL  --   < > 118*  CO2  --   < > 18*  GLUCOSE  --   < >  105*  BUN  --   < > 9  CREATININE  --   < > 0.56  CALCIUM  --   < > 8.6*  MG 1.8  --   --   < > = values in this interval not displayed.   Microbiology Results  Results for orders placed or performed during the hospital encounter of 11/12/16  Blood Culture (routine x 2)     Status: Abnormal   Collection Time: 11/12/16 10:08 AM  Result Value Ref Range Status   Specimen Description BLOOD RIGHT HAND  Final   Special Requests   Final    BOTTLES DRAWN AEROBIC AND ANAEROBIC Blood Culture adequate volume   Culture  Setup Time   Final    GRAM POSITIVE COCCI IN BOTH AEROBIC AND ANAEROBIC BOTTLES CRITICAL VALUE NOTED.  VALUE IS CONSISTENT WITH PREVIOUSLY REPORTED AND CALLED VALUE.    Culture STAPHYLOCOCCUS SPECIES (COAGULASE NEGATIVE) (A)  Final   Report Status 11/16/2016 FINAL  Final   Organism ID, Bacteria STAPHYLOCOCCUS SPECIES (COAGULASE NEGATIVE)  Final      Susceptibility   Staphylococcus species (coagulase negative) - MIC*    CIPROFLOXACIN <=0.5 SENSITIVE Sensitive     ERYTHROMYCIN >=8 RESISTANT Resistant     GENTAMICIN <=0.5 SENSITIVE Sensitive     OXACILLIN RESISTANT Resistant     VANCOMYCIN <=0.5 SENSITIVE Sensitive     TRIMETH/SULFA <=10 SENSITIVE Sensitive     CLINDAMYCIN RESISTANT Resistant     RIFAMPIN <=0.5 SENSITIVE Sensitive     Inducible Clindamycin POSITIVE Resistant     * STAPHYLOCOCCUS SPECIES (COAGULASE NEGATIVE)  Blood Culture (routine x 2)     Status: Abnormal   Collection Time:  11/12/16 10:08 AM  Result Value Ref Range Status   Specimen Description BLOOD LEFT HAND  Final   Special Requests   Final    BOTTLES DRAWN AEROBIC AND ANAEROBIC Blood Culture adequate volume   Culture  Setup Time   Final    GRAM POSITIVE COCCI IN BOTH AEROBIC AND ANAEROBIC BOTTLES CRITICAL RESULT CALLED TO, READ BACK BY AND VERIFIED WITH: MATT MCBANE @ [id number] ON 11/13/2016 BY CAF    Culture (A)  Final    STAPHYLOCOCCUS SPECIES (COAGULASE NEGATIVE) SUSCEPTIBILITIES PERFORMED ON PREVIOUS CULTURE WITHIN THE LAST 5 DAYS. Performed at [facility], [place] [address], [place], [place] [address]    Report Status 11/16/2016 FINAL  Final  Urine culture     Status: None   Collection Time: 11/12/16 10:08 AM  Result Value Ref Range Status   Specimen Description URINE, RANDOM  Final   Special Requests NONE  Final   Culture   Final    NO GROWTH Performed at [facility], [place] [address], [place], [place] [address]    Report Status 11/13/2016 FINAL  Final  Blood Culture ID Panel (Reflexed)     Status: Abnormal   Collection Time: 11/12/16 10:08 AM  Result Value Ref Range Status   Enterococcus species NOT DETECTED NOT DETECTED Final   Listeria monocytogenes NOT DETECTED NOT DETECTED Final   Staphylococcus species DETECTED (A) NOT DETECTED Final    Comment: Methicillin (oxacillin) resistant coagulase negative staphylococcus. Possible blood culture contaminant (unless isolated from more than one blood culture draw or clinical case suggests pathogenicity). No antibiotic treatment is indicated for blood  culture contaminants. CRITICAL RESULT CALLED TO, READ BACK BY AND VERIFIED WITH: MATT MCBANE @ [id number] ON 11/13/2016 BY CAF    Staphylococcus aureus NOT DETECTED NOT DETECTED Final  Methicillin resistance DETECTED (A) NOT DETECTED Final    Comment: CRITICAL RESULT CALLED TO, READ BACK BY AND VERIFIED WITH: MATT MCBANE @ [id number] ON 11/13/2016 BY CAF    Streptococcus species NOT DETECTED NOT  DETECTED Final   Streptococcus agalactiae NOT DETECTED NOT DETECTED Final   Streptococcus pneumoniae NOT DETECTED NOT DETECTED Final   Streptococcus pyogenes NOT DETECTED NOT DETECTED Final   Acinetobacter baumannii NOT DETECTED NOT DETECTED Final   Enterobacteriaceae species NOT DETECTED NOT DETECTED Final   Enterobacter cloacae complex NOT DETECTED NOT DETECTED Final   Escherichia coli NOT DETECTED NOT DETECTED Final   Klebsiella oxytoca NOT DETECTED NOT DETECTED Final   Klebsiella pneumoniae NOT DETECTED NOT DETECTED Final   Proteus species NOT DETECTED NOT DETECTED Final   Serratia marcescens NOT DETECTED NOT DETECTED Final   Haemophilus influenzae NOT DETECTED NOT DETECTED Final   Neisseria meningitidis NOT DETECTED NOT DETECTED Final   Pseudomonas aeruginosa NOT DETECTED NOT DETECTED Final   Candida albicans NOT DETECTED NOT DETECTED Final   Candida glabrata NOT DETECTED NOT DETECTED Final   Candida krusei NOT DETECTED NOT DETECTED Final   Candida parapsilosis NOT DETECTED NOT DETECTED Final   Candida tropicalis NOT DETECTED NOT DETECTED Final  Culture, blood (Routine X 2) w Reflex to ID Panel     Status: Abnormal   Collection Time: 11/13/16  9:00 PM  Result Value Ref Range Status   Specimen Description BLOOD LEFT HAND  Final   Special Requests   Final    BOTTLES DRAWN AEROBIC AND ANAEROBIC Blood Culture adequate volume   Culture  Setup Time   Final    GRAM POSITIVE COCCI ANAEROBIC BOTTLE ONLY CRITICAL RESULT CALLED TO, READ BACK BY AND VERIFIED WITH: STEPHANIE SHUDER ON 11/15/16 AT 1111 [facility]    Culture (A)  Final    STAPHYLOCOCCUS SPECIES (COAGULASE NEGATIVE) SUSCEPTIBILITIES PERFORMED ON PREVIOUS CULTURE WITHIN THE LAST 5 DAYS. Performed at [facility], [place] [address], [place], [place] [address]    Report Status 11/16/2016 FINAL  Final  Culture, blood (Routine X 2) w Reflex to ID Panel     Status: None   Collection Time: 11/13/16  9:12 PM  Result Value Ref Range  Status   Specimen Description BLOOD LEFT ARM  Final   Special Requests   Final    BOTTLES DRAWN AEROBIC AND ANAEROBIC Blood Culture adequate volume   Culture NO GROWTH 5 DAYS  Final   Report Status 11/18/2016 FINAL  Final  Culture, blood (single) w Reflex to ID Panel     Status: None (Preliminary result)   Collection Time: 11/16/16  7:03 PM  Result Value Ref Range Status   Specimen Description BLOOD BLOOD LEFT HAND  Final   Special Requests   Final    BOTTLES DRAWN AEROBIC AND ANAEROBIC Blood Culture results may not be optimal due to an inadequate volume of blood received in culture bottles   Culture NO GROWTH 3 DAYS  Final   Report Status PENDING  Incomplete    RADIOLOGY:  No results found.   Management plans discussed with the patient, family and they are in agreement.  CODE STATUS:     Code Status Orders        Start     Ordered   11/12/16 1353  Do not attempt resuscitation (DNR)  Continuous    Question Answer Comment  In the event of cardiac or respiratory ARREST Do not call a "code blue"   In  the event of cardiac or respiratory ARREST Do not perform Intubation, CPR, defibrillation or ACLS   In the event of cardiac or respiratory ARREST Use medication by any route, position, wound care, and other measures to relive pain and suffering. May use oxygen, suction and manual treatment of airway obstruction as needed for comfort.      11/12/16 1352    Code Status History    Date Active Date Inactive Code Status Order ID Comments User Context   11/12/2016  1:39 PM 11/12/2016  1:52 PM Full Code [id number]  Epifanio Lesches, MD ED   07/10/2015 10:55 PM 07/26/2015  8:43 PM DNR [id number]  Lance Coon, MD Inpatient   06/24/2015  5:41 PM 06/26/2015  7:08 PM DNR [id number]  Colleen Can, MD Inpatient   06/21/2015  8:04 PM 06/24/2015  5:41 PM Full Code [id number]  Henreitta Leber, MD Inpatient   06/12/2015  8:35 PM 06/15/2015  7:56 PM Full Code [id number]  Gladstone Lighter, MD  Inpatient    Advance Directive Documentation     Most Recent Value  Type of Advance Directive  Out of facility DNR (pink MOST or yellow form)  Pre-existing out of facility DNR order (yellow form or pink MOST form)  Yellow form placed in chart (order not valid for inpatient use)  "MOST" Form in Place?  -      TOTAL TIME TAKING CARE OF THIS PATIENT: 37 minutes.    Tenea Sens M.D on 11/19/2016 at 1:42 PM  Between 7am to 6pm - Pager - [phone]  After 6pm go to [email or web address] - Proofreader  [facility]  Office  [phone]  CC: Primary care physician; Caryl Never, MD   Note: This dictation was prepared with Dragon dictation along with smaller phrase technology. Any transcriptional errors that result from this process are unintentional.

## 2016-11-20 LAB — CREATININE, SERUM
CREATININE: 0.51 mg/dL (ref 0.44–1.00)
GFR calc Af Amer: >60 mL/min (ref >60)
GFR calc non Af Amer: >60 mL/min (ref >60)

## 2016-11-20 LAB — GLUCOSE, CAPILLARY
Glucose-Capillary: 84 mg/dL (ref 65–99)
Glucose-Capillary: 140 mg/dL — ABNORMAL HIGH (ref 65–99)

## 2016-11-20 LAB — CBC
HCT: 26.3 % — ABNORMAL LOW (ref 35.0–47.0)
Hemoglobin: 8.8 g/dL — ABNORMAL LOW (ref 12.0–16.0)
MCH: 28.5 pg (ref 26.0–34.0)
MCHC: 33.4 g/dL (ref 32.0–36.0)
MCV: 85.3 fL (ref 80.0–100.0)
PLATELETS: 183 10*3/uL (ref 150–440)
RBC: 3.08 MIL/uL — AB (ref 3.80–5.20)
RDW: 16.3 % — ABNORMAL HIGH (ref 11.5–14.5)
WBC: 13.4 10*3/uL — AB (ref 3.6–11.0)

## 2016-11-20 MED ORDER — VANCOMYCIN IV (FOR PTA / DISCHARGE USE ONLY): 750.0000 mg | INTRAVENOUS | 0 refills | Status: DC

## 2016-11-20 MED ORDER — VANCOMYCIN HCL 500 MG IV SOLR
500.0000 mg | INTRAVENOUS | Status: DC
  Filled 2016-11-20: qty 500

## 2016-11-20 MED ORDER — VANCOMYCIN HCL IN DEXTROSE 750-5 MG/150ML-% IV SOLN
750.0000 mg | INTRAVENOUS | Status: DC
  Administered 2016-11-20: 750 mg via INTRAVENOUS
  Filled 2016-11-20: qty 150

## 2016-11-20 MED ORDER — VANCOMYCIN HCL IN DEXTROSE 750-5 MG/150ML-% IV SOLN: 750.0000 mg | INTRAVENOUS | 0 refills | Status: DC

## 2016-11-20 NOTE — Care Management (Signed)
There is a dosage and frequency change in vancomycin.  She will receive a dose this morning at 10A, and home health will see patient in the home 9/1.  Patient will require a trough level 9/2 or 9/3.  Have reached out to home health agency to inquire about which would be most convenient. [facility] notified.  [facility] will provide home nebulizer.

## 2016-11-20 NOTE — Progress Notes (Addendum)
ANTIBIOTIC CONSULT NOTE - FOLLOW UP   Pharmacy Consult for Vancomycin Indication: CoNS bacteremia with pacemaker  No Known Allergies  Patient Measurements: Height: 5\' 2"  (157.5 cm) Weight: 106 lb 8 oz (48.3 kg) IBW/kg (Calculated) : 50.1  Vital Signs: Temp: 97.4 F (36.3 C) (08/31 0331) Temp Source: Oral (08/31 0331) BP: 121/83 (08/31 0331) Pulse Rate: 70 (08/31 0331) Intake/Output from previous day: 08/30 0701 - 08/31 0700 In: 581 [I.V.:581] Out: 0  Intake/Output from this shift: Total I/O In: -  Out: 400 [Urine:400]  Labs:  Recent Labs  11/18/16 0541 11/20/16 0416  WBC  --  13.4*  HGB  --  8.8*  PLT  --  183  CREATININE 0.56 0.51   Estimated Creatinine Clearance: 33.5 mL/min (by C-G formula based on SCr of 0.51 mg/dL).  Recent Labs  11/19/16 2300  VANCOTROUGH 13*    Medical History: Past Medical History:  Diagnosis Date  . Arthritis   . Dementia   . Diabetes mellitus without complication ([facility])   . Gout   . Hypertension   . Osteomyelitis ([facility])    sacral wound    Assessment: Patient is receiving vancomycin for a CoNS bacteremia. Patient also has a PPM and unable to rule out infected leads per notes. Patient will be discharged on IV vancomycin and PO rifampin with a stop date of October 8th, 2018.  Goal of Therapy:  Vancomycin Trough level 15-20   Plan:  VT 8/30 = 13 mcg/mL was subtherapeutic on a regimen of vancomycin 500 mg IV q24h prior to the 4th dose. Dose was changed to 500 mg IV q18h, however, q18h administration is not feasible with outpatient antibiotic administration.  Per discussion with case management, able to coordinate administration of antibiotics in the morning hours while outpatient.  Will change vancomycin dose to 750 mg IV q24h (~15 mg/kg) beginning this morning since VT was low.  VT to be measured prior to 3rd or 4th dose.   Cindi CarbonMary M Alekzander Cardell, PharmD, BCPS Clinical Pharmacist 11/20/2016,9:37 AM

## 2016-11-20 NOTE — Discharge Summary (Addendum)
[facility] NAME: Jasmine Briggs    MR#:  [id number]  DATE OF BIRTH:  10/27/1920  DATE OF ADMISSION:  11/12/2016 ADMITTING PHYSICIAN: Epifanio Lesches, MD  DATE OF DISCHARGE: 11/20/2016  PRIMARY CARE PHYSICIAN: Caryl Never, MD    ADMISSION DIAGNOSIS:  Encephalopathy [G93.40] Acute respiratory failure with hypoxia ([place]) [J96.01] Sepsis, due to unspecified organism ([place]) [A41.9]  DISCHARGE DIAGNOSIS:  Active Problems:   Sepsis ([place])   Acute respiratory failure with hypoxia ([place])   MRSA bacteremia   Palliative care encounter   Protein-calorie malnutrition, severe   Encephalopathy   Advance care planning   Goals of care, counseling/discussion   SECONDARY DIAGNOSIS:   Past Medical History:  Diagnosis Date  . Arthritis   . Dementia   . Diabetes mellitus without complication ([place])   . Gout   . Hypertension   . Osteomyelitis ([place])    sacral wound    HOSPITAL COURSE:  83 year old female with a history of severe dementia, bedbound status, pacemaker and sacral wound with osteomyelitis who presented to the emergency room due to poor oral intake and decreased responsiveness and found to have severe hypernatremia.  1. Severe hypernatremia with elevated chloride due to free water deficit Sodium levels have improved Due to her severe dementia and this may be a chronic issue and this was addressed with the family.  2 hypokalemia: This has been repleted  3. Sepsis due to gram-positive cocci bacteremia coag-negative staph: Patient was seen and evaluated by infectious disease consultant. Etiology of bacteremia is due to decubitus ulcer and chronic skin issues as well as the possibility of pacemaker infection. Family did not want TEE to investigate further if patient has endocarditis. Transthoracic echocardiogram was negative for endocarditis. Repeat blood cultures have been negative. She has a PICC line and will need IV  antibiotics as the recommendations by Dr. Ola Spurr. Empiric treatment for 6 weeks has been recommended. She is discharged on vancomycin and oral rifampin. She has follow-up with Dr. Ola Spurr.  4. Severe protein calorie malnutrition with severe significant temporal wasting: Continue honey thick liquids  5. Acute metabolic encephalopathy due to sepsis which has improved to her baseline  6. Acute kidney injury on chronic kidney disease stage III: Creatinine remains at baseline 7. Stage III pressure injury present on admission: Cleanse sacral wound with NS and pat gently dry.  Apply calcium alginate to wound bed to keep moist and promote healing.  Cover with silicone border foam  Patient will benefit from outpatient pot of care follow-up  DISCHARGE CONDITIONS AND DIET:  Guarded condition with dysphagia 1 diet honey thickened Aspiration precautions  CONSULTS OBTAINED:  Treatment Team:  Leonel Ramsay, MD  DRUG ALLERGIES:  No Known Allergies  DISCHARGE MEDICATIONS:   Current Discharge Medication List    START taking these medications   Details  ipratropium-albuterol (DUONEB) 0.5-2.5 (3) MG/3ML SOLN Take 3 mLs by nebulization every 6 (six) hours as needed. Qty: 360 mL, Refills: 0    rifampin (RIFADIN) 300 MG capsule Take 1 capsule (300 mg total) by mouth every 12 (twelve) hours. X 6 weeks Qty: 100 capsule, Refills: 0    vancomycin 500 mg in sodium chloride 0.9 % 100 mL Inject 500 mg into the vein daily. X 6 weeks Qty: 24 g, Refills: 0    vancomycin IVPB Inject 500 mg into the vein daily. Indication:  Coag neg staph bacteremia Last Day of Therapy:  12/28/16 Labs - Sunday/Monday:  CBC/D,  BMP, and vancomycin trough. Labs - Thursday:  BMP and vancomycin trough Labs - Every other week:  ESR and CRP Qty: 42 Units, Refills: 0      CONTINUE these medications which have NOT CHANGED   Details  acetaminophen (TYLENOL) 500 MG tablet Take 500 mg by mouth every 6 (six) hours as  needed for mild pain.     nicotine (NICODERM CQ - DOSED IN MG/24 HR) 7 mg/24hr patch Place 1 patch (7 mg total) onto the skin daily. Qty: 28 patch, Refills: 0    ondansetron (ZOFRAN) 4 MG tablet Take 1 tablet (4 mg total) by mouth every 6 (six) hours as needed for nausea. Qty: 20 tablet, Refills: 0    potassium chloride 20 MEQ/15ML (10%) SOLN Take 15 mLs (20 mEq total) by mouth daily. Qty: 450 mL, Refills: 0      STOP taking these medications     torsemide (DEMADEX) 20 MG tablet           Today   CHIEF COMPLAINT:   Family at bedside.    VITAL SIGNS:  Blood pressure 121/83, pulse 70, temperature (!) 97.4 F (36.3 C), temperature source Oral, resp. rate 18, height 5' 2"  (1.575 m), weight 48.3 kg (106 lb 8 oz), SpO2 95 %.   REVIEW OF SYSTEMS:  Review of Systems  Unable to perform ROS: Dementia     PHYSICAL EXAMINATION:  GENERAL:  83 y.o.-year-old patient lying in the bed with no acute distress. Frail temporal wasting NECK:  Supple, no jugular venous distention. No thyroid enlargement, no tenderness.  LUNGS: Normal breath sounds bilaterally, no wheezing, rales,rhonchi  No use of accessory muscles of respiration.  CARDIOVASCULAR: S1, S2 normal. No murmurs, rubs, or gallops.  ABDOMEN: Soft, non-tender, non-distended. Bowel sounds present. No organomegaly or mass.  EXTREMITIES: Contracted no edema noted PSYCHIATRIC: The patient is sleeping  SKIN: Stage III pressure injury present on admission located sacrum  DATA REVIEW:   CBC  Recent Labs Lab 11/20/16 0416  WBC 13.4*  HGB 8.8*  HCT 26.3*  PLT 183    Chemistries   Recent Labs Lab 11/16/16 2246  11/18/16 0541 11/20/16 0416  NA 142  < > 143  --   K  --   < > 3.5  --   CL  --   < > 118*  --   CO2  --   < > 18*  --   GLUCOSE  --   < > 105*  --   BUN  --   < > 9  --   CREATININE  --   < > 0.56 0.51  CALCIUM  --   < > 8.6*  --   MG 1.8  --   --   --   < > = values in this interval not  displayed.  Cardiac Enzymes No results for input(s): TROPONINI in the last 168 hours.  Microbiology Results  @[email or web address] @  RADIOLOGY:  Ir Cyndy Freeze Guide Cv Line Left  Result Date: 11/19/2016 INDICATION: 83 year old female in need of durable venous access for outpatient antibiotic therapy. EXAM: PICC LINE PLACEMENT WITH ULTRASOUND AND FLUOROSCOPIC GUIDANCE MEDICATIONS: None ANESTHESIA/SEDATION: None FLUOROSCOPY TIME:  Fluoroscopy Time: 0 minutes 24 seconds (4 mGy). COMPLICATIONS: None immediate. PROCEDURE: The patient was advised of the possible risks and complications and agreed to undergo the procedure. The patient was then brought to the angiographic suite for the procedure. The right/left arm was prepped with chlorhexidine, draped in the usual sterile fashion using  maximum barrier technique (cap and mask, sterile gown, sterile gloves, large sterile sheet, hand hygiene and cutaneous antisepsis) and infiltrated locally with 1% Lidocaine. Ultrasound demonstrated patency of the left brachial vein, and this was documented with an image. Under real-time ultrasound guidance, this vein was accessed with a 21 gauge micropuncture needle and image documentation was performed. A 0.018 wire was introduced in to the vein. Over this, a 4 [place] single lumen power injectable PICC was advanced to the lower SVC/right atrial junction. Fluoroscopy during the procedure and fluoro spot radiograph confirms appropriate catheter position. The catheter was flushed and covered with a sterile dressing. Catheter length: 35 cm IMPRESSION: Successful left arm Power PICC line placement with ultrasound and fluoroscopic guidance. The catheter is ready for use. Electronically Signed   By: Jacqulynn Cadet M.D.   On: 11/19/2016 16:49      Current Discharge Medication List    START taking these medications   Details  ipratropium-albuterol (DUONEB) 0.5-2.5 (3) MG/3ML SOLN Take 3 mLs by nebulization every 6 (six) hours as  needed. Qty: 360 mL, Refills: 0    rifampin (RIFADIN) 300 MG capsule Take 1 capsule (300 mg total) by mouth every 12 (twelve) hours. X 6 weeks Qty: 100 capsule, Refills: 0    vancomycin 500 mg in sodium chloride 0.9 % 100 mL Inject 500 mg into the vein daily. X 6 weeks Qty: 24 g, Refills: 0    vancomycin IVPB Inject 500 mg into the vein daily. Indication:  Coag neg staph bacteremia Last Day of Therapy:  12/28/16 Labs - Sunday/Monday:  CBC/D, BMP, and vancomycin trough. Labs - Thursday:  BMP and vancomycin trough Labs - Every other week:  ESR and CRP Qty: 42 Units, Refills: 0      CONTINUE these medications which have NOT CHANGED   Details  acetaminophen (TYLENOL) 500 MG tablet Take 500 mg by mouth every 6 (six) hours as needed for mild pain.     nicotine (NICODERM CQ - DOSED IN MG/24 HR) 7 mg/24hr patch Place 1 patch (7 mg total) onto the skin daily. Qty: 28 patch, Refills: 0    ondansetron (ZOFRAN) 4 MG tablet Take 1 tablet (4 mg total) by mouth every 6 (six) hours as needed for nausea. Qty: 20 tablet, Refills: 0    potassium chloride 20 MEQ/15ML (10%) SOLN Take 15 mLs (20 mEq total) by mouth daily. Qty: 450 mL, Refills: 0      STOP taking these medications     torsemide (DEMADEX) 20 MG tablet            Management plans discussed with the patient's family and they are in agreement. Stable for discharge  home with home health care  Patient should follow up with Dr. Fitzgerald  CODE STATUS:     Code Status Orders        Start     Ordered   11/12/16 1353  Do not attempt resuscitation (DNR)  Continuous    Question Answer Comment  In the event of cardiac or respiratory ARREST Do not call a "code blue"   In the event of cardiac or respiratory ARREST Do not perform Intubation, CPR, defibrillation or ACLS   In the event of cardiac or respiratory ARREST Use medication by any route, position, wound care, and other measures to relive pain and suffering. May use oxygen,  suction and manual treatment of airway obstruction as needed for comfort.      08 /23/18 1352    Code Status  History    Date Active Date Inactive Code Status Order ID Comments User Context   11/12/2016  1:39 PM 11/12/2016  1:52 PM Full Code [id number]  Epifanio Lesches, MD ED   07/10/2015 10:55 PM 07/26/2015  8:43 PM DNR [id number]  Lance Coon, MD Inpatient   06/24/2015  5:41 PM 06/26/2015  7:08 PM DNR [id number]  Colleen Can, MD Inpatient   06/21/2015  8:04 PM 06/24/2015  5:41 PM Full Code [id number]  Henreitta Leber, MD Inpatient   06/12/2015  8:35 PM 06/15/2015  7:56 PM Full Code [id number]  Gladstone Lighter, MD Inpatient    Advance Directive Documentation     Most Recent Value  Type of Advance Directive  Out of facility DNR (pink MOST or yellow form)  Pre-existing out of facility DNR order (yellow form or pink MOST form)  Yellow form placed in chart (order not valid for inpatient use)  "MOST" Form in Place?  -      TOTAL TIME TAKING CARE OF THIS PATIENT: 38 minutes.    Note: This dictation was prepared with Dragon dictation along with smaller phrase technology. Any transcriptional errors that result from this process are unintentional.  Mickell Birdwell M.D on 11/20/2016 at 9:29 AM  Between 7am to 6pm - Pager - [phone] After 6pm go to [email or web address] - password [email or web address] [facility]  Office  [phone]  CC: Primary care physician; Caryl Never, MD

## 2016-11-20 NOTE — Progress Notes (Signed)
EMS to pick up pt for discharge, pts family instructed on discharge instructions and medications and verbalized understanding, Prescriptions given to EMS via discharge packet, pts family informed that pt was bladder scanned  And straight cathed around 0930 this morning 400ml of urine was collected per DR. Mody's orders, and that if patient had not voided by 1600 to call primary care doctor before close of business and inform pts PCP. Family verbalized understanding. Pt left via stretcher in stable condition with no s/s of distress or discomfort. Suzzette Righterourtney Ubah Radke, RN

## 2016-11-20 NOTE — Progress Notes (Signed)
ANTIBIOTIC CONSULT NOTE - FOLLOW UP   Pharmacy Consult for Vancomycin and Zosyn Indication: sepsis  No Known Allergies  Patient Measurements: Height: 5\' 2"  (157.5 cm) Weight: 107 lb 4.8 oz (48.7 kg) IBW/kg (Calculated) : 50.1 Adjusted Body Weight:   Vital Signs: Temp: 97.8 F (36.6 C) (08/30 1933) Temp Source: Oral (08/30 1933) BP: 132/89 (08/30 1933) Pulse Rate: 69 (08/30 1933) Intake/Output from previous day: No intake/output data recorded. Intake/Output from this shift: No intake/output data recorded.  Labs:  Recent Labs  11/17/16 0519 11/18/16 0541  WBC 11.8*  --   HGB 9.9*  --   PLT 140*  --   CREATININE 0.70 0.56   Estimated Creatinine Clearance: 33.8 mL/min (by C-G formula based on SCr of 0.56 mg/dL).  Recent Labs  11/19/16 2300  VANCOTROUGH 13*     Microbiology: Recent Results (from the past 720 hour(s))  Blood Culture (routine x 2)     Status: Abnormal   Collection Time: 11/12/16 10:08 AM  Result Value Ref Range Status   Specimen Description BLOOD RIGHT HAND  Final   Special Requests   Final    BOTTLES DRAWN AEROBIC AND ANAEROBIC Blood Culture adequate volume   Culture  Setup Time   Final    GRAM POSITIVE COCCI IN BOTH AEROBIC AND ANAEROBIC BOTTLES CRITICAL VALUE NOTED.  VALUE IS CONSISTENT WITH PREVIOUSLY REPORTED AND CALLED VALUE.    Culture STAPHYLOCOCCUS SPECIES (COAGULASE NEGATIVE) (A)  Final   Report Status 11/16/2016 FINAL  Final   Organism ID, Bacteria STAPHYLOCOCCUS SPECIES (COAGULASE NEGATIVE)  Final      Susceptibility   Staphylococcus species (coagulase negative) - MIC*    CIPROFLOXACIN <=0.5 SENSITIVE Sensitive     ERYTHROMYCIN >=8 RESISTANT Resistant     GENTAMICIN <=0.5 SENSITIVE Sensitive     OXACILLIN RESISTANT Resistant     VANCOMYCIN <=0.5 SENSITIVE Sensitive     TRIMETH/SULFA <=10 SENSITIVE Sensitive     CLINDAMYCIN RESISTANT Resistant     RIFAMPIN <=0.5 SENSITIVE Sensitive     Inducible Clindamycin POSITIVE Resistant      * STAPHYLOCOCCUS SPECIES (COAGULASE NEGATIVE)  Blood Culture (routine x 2)     Status: Abnormal   Collection Time: 11/12/16 10:08 AM  Result Value Ref Range Status   Specimen Description BLOOD LEFT HAND  Final   Special Requests   Final    BOTTLES DRAWN AEROBIC AND ANAEROBIC Blood Culture adequate volume   Culture  Setup Time   Final    GRAM POSITIVE COCCI IN BOTH AEROBIC AND ANAEROBIC BOTTLES CRITICAL RESULT CALLED TO, READ BACK BY AND VERIFIED WITH: MATT MCBANE @ 0553 ON 11/13/2016 BY CAF    Culture (A)  Final    STAPHYLOCOCCUS SPECIES (COAGULASE NEGATIVE) SUSCEPTIBILITIES PERFORMED ON PREVIOUS CULTURE WITHIN THE LAST 5 DAYS. Performed at [facility], [address], [place], [place] [address]    Report Status 11/16/2016 FINAL  Final  Urine culture     Status: None   Collection Time: 11/12/16 10:08 AM  Result Value Ref Range Status   Specimen Description URINE, RANDOM  Final   Special Requests NONE  Final   Culture   Final    NO GROWTH Performed at [facility], [address] [place], [place] [address]    Report Status 11/13/2016 FINAL  Final  Blood Culture ID Panel (Reflexed)     Status: Abnormal   Collection Time: 11/12/16 10:08 AM  Result Value Ref Range Status   Enterococcus species NOT  DETECTED NOT DETECTED Final   Listeria monocytogenes NOT DETECTED NOT DETECTED Final   Staphylococcus species DETECTED (A) NOT DETECTED Final    Comment: Methicillin (oxacillin) resistant coagulase negative staphylococcus. Possible blood culture contaminant (unless isolated from more than one blood culture draw or clinical case suggests pathogenicity). No antibiotic treatment is indicated for blood  culture contaminants. CRITICAL RESULT CALLED TO, READ BACK BY AND VERIFIED WITH: MATT MCBANE @ 0553 ON 11/13/2016 BY CAF    Staphylococcus aureus NOT DETECTED NOT DETECTED Final   Methicillin resistance DETECTED (A) NOT DETECTED Final    Comment: CRITICAL RESULT CALLED TO,  READ BACK BY AND VERIFIED WITH: MATT MCBANE @ 0553 ON 11/13/2016 BY CAF    Streptococcus species NOT DETECTED NOT DETECTED Final   Streptococcus agalactiae NOT DETECTED NOT DETECTED Final   Streptococcus pneumoniae NOT DETECTED NOT DETECTED Final   Streptococcus pyogenes NOT DETECTED NOT DETECTED Final   Acinetobacter baumannii NOT DETECTED NOT DETECTED Final   Enterobacteriaceae species NOT DETECTED NOT DETECTED Final   Enterobacter cloacae complex NOT DETECTED NOT DETECTED Final   Escherichia coli NOT DETECTED NOT DETECTED Final   Klebsiella oxytoca NOT DETECTED NOT DETECTED Final   Klebsiella pneumoniae NOT DETECTED NOT DETECTED Final   Proteus species NOT DETECTED NOT DETECTED Final   Serratia marcescens NOT DETECTED NOT DETECTED Final   Haemophilus influenzae NOT DETECTED NOT DETECTED Final   Neisseria meningitidis NOT DETECTED NOT DETECTED Final   Pseudomonas aeruginosa NOT DETECTED NOT DETECTED Final   Candida albicans NOT DETECTED NOT DETECTED Final   Candida glabrata NOT DETECTED NOT DETECTED Final   Candida krusei NOT DETECTED NOT DETECTED Final   Candida parapsilosis NOT DETECTED NOT DETECTED Final   Candida tropicalis NOT DETECTED NOT DETECTED Final  Culture, blood (Routine X 2) w Reflex to ID Panel     Status: Abnormal   Collection Time: 11/13/16  9:00 PM  Result Value Ref Range Status   Specimen Description BLOOD LEFT HAND  Final   Special Requests   Final    BOTTLES DRAWN AEROBIC AND ANAEROBIC Blood Culture adequate volume   Culture  Setup Time   Final    GRAM POSITIVE COCCI ANAEROBIC BOTTLE ONLY CRITICAL RESULT CALLED TO, READ BACK BY AND VERIFIED WITH: STEPHANIE SHUDER ON 11/15/16 AT [facility] [facility]    Culture (A)  Final    STAPHYLOCOCCUS SPECIES (COAGULASE NEGATIVE) SUSCEPTIBILITIES PERFORMED ON PREVIOUS CULTURE WITHIN THE LAST 5 DAYS. Performed at [facility], [address] [place], [place] [address]    Report Status 11/16/2016 FINAL  Final  Culture, blood  (Routine X 2) w Reflex to ID Panel     Status: None   Collection Time: 11/13/16  9:12 PM  Result Value Ref Range Status   Specimen Description BLOOD LEFT ARM  Final   Special Requests   Final    BOTTLES DRAWN AEROBIC AND ANAEROBIC Blood Culture adequate volume   Culture NO GROWTH 5 DAYS  Final   Report Status 11/18/2016 FINAL  Final  Culture, blood (single) w Reflex to ID Panel     Status: None (Preliminary result)   Collection Time: 11/16/16  7:03 PM  Result Value Ref Range Status   Specimen Description BLOOD BLOOD LEFT HAND  Final   Special Requests   Final    BOTTLES DRAWN AEROBIC AND ANAEROBIC Blood Culture results may not be optimal due to an inadequate volume of blood received in culture bottles   Culture NO GROWTH 3 DAYS  Final   Report Status PENDING  Incomplete    Medical History: Past Medical History:  Diagnosis Date  . Arthritis   . Dementia   . Diabetes mellitus without complication ([facility])   . Gout   . Hypertension   . Osteomyelitis ([facility])    sacral wound    Medications:  Prescriptions Prior to Admission  Medication Sig Dispense Refill Last Dose  . acetaminophen (TYLENOL) 500 MG tablet Take 500 mg by mouth every 6 (six) hours as needed for mild pain.    PRN at PRN  . torsemide (DEMADEX) 20 MG tablet Take 1 tablet (20 mg total) by mouth 2 (two) times daily. 60 tablet 5 11/11/2016 at 2000  . nicotine (NICODERM CQ - DOSED IN MG/24 HR) 7 mg/24hr patch Place 1 patch (7 mg total) onto the skin daily. (Patient not taking: Reported on 11/12/2016) 28 patch 0 Not Taking at Unknown time  . ondansetron (ZOFRAN) 4 MG tablet Take 1 tablet (4 mg total) by mouth every 6 (six) hours as needed for nausea. (Patient not taking: Reported on 11/12/2016) 20 tablet 0 Not Taking at Unknown time  . potassium chloride 20 MEQ/15ML (10%) SOLN Take 15 mLs (20 mEq total) by mouth daily. (Patient not taking: Reported on 11/12/2016) 450 mL 0 Not Taking at Unknown time   Scheduled:  . chlorhexidine  15  mL Mouth Rinse BID  . docusate sodium  100 mg Oral BID  . heparin  5,000 Units Subcutaneous Q8H  . insulin aspart  0-9 Units Subcutaneous Q6H  . ipratropium-albuterol  3 mL Nebulization Q6H  . mouth rinse  15 mL Mouth Rinse q12n4p  . rifampin  300 mg Oral Q12H   Assessment: Pharmacy consulted to dose and monitor Vancomycin and Zosyn in this critically ill elderly woman.   Goal of Therapy:  Vancomycin Trough level 15-20   Plan:  Vancomycin 500 mg IV q36 hours  Will change Zosyn to 3.375 g IV q8 hours.   8/27:  Vanc trough @ 22:46 = 11 mcg/mL Will increase dose to Vancomycin 500 mg IV Q24H to start 8/28 @ 00:00. Will recheck VT before third new dose on 8/30 @ 23:30.   8/30:  Vanc trough @ 23:00 = 13 mcg/mL Will increase dose to vancomycin 500 mg IV Q18H to start 8/31 @ 18:00.  Will recheck VT before third new dose on 9/2 @ 0530.  Ghina Bittinger D 11/20/2016,3:18 AM

## 2016-11-20 NOTE — Progress Notes (Signed)
Patient am bladder scan showed 363 cc, pt does not seem to be uncomfortable. Pt is resting in bed at this time. MD paged, awaiting response back.   Mayra NeerNesbitt, Vivianna Piccini M

## 2016-11-21 ENCOUNTER — Encounter: Payer: Self-pay | Admitting: Emergency Medicine

## 2016-11-21 ENCOUNTER — Emergency Department
Admission: EM | Admit: 2016-11-21 | Discharge: 2016-11-21 | Disposition: A | Discharge status: Home / Self Care | Payer: Medicare Other | Attending: Student in an Organized Health Care Education/Training Program | Admitting: Student in an Organized Health Care Education/Training Program

## 2016-11-21 DIAGNOSIS — E119 Type 2 diabetes mellitus without complications: Secondary | ICD-10-CM | POA: Insufficient documentation

## 2016-11-21 DIAGNOSIS — R339 Retention of urine, unspecified: Secondary | ICD-10-CM | POA: Diagnosis not present

## 2016-11-21 DIAGNOSIS — Z87891 Personal history of nicotine dependence: Secondary | ICD-10-CM | POA: Insufficient documentation

## 2016-11-21 DIAGNOSIS — I10 Essential (primary) hypertension: Secondary | ICD-10-CM | POA: Diagnosis not present

## 2016-11-21 DIAGNOSIS — Z950 Presence of cardiac pacemaker: Secondary | ICD-10-CM | POA: Diagnosis not present

## 2016-11-21 LAB — CULTURE, BLOOD (SINGLE): CULTURE: NO GROWTH

## 2016-11-21 LAB — CBC
HCT: 28.2 % — ABNORMAL LOW (ref 35.0–47.0)
HEMOGLOBIN: 9.4 g/dL — AB (ref 12.0–16.0)
MCH: 28.7 pg (ref 26.0–34.0)
MCHC: 33.2 g/dL (ref 32.0–36.0)
MCV: 86.5 fL (ref 80.0–100.0)
Platelets: 225 10*3/uL (ref 150–440)
RBC: 3.26 MIL/uL — ABNORMAL LOW (ref 3.80–5.20)
RDW: 16.2 % — ABNORMAL HIGH (ref 11.5–14.5)
WBC: 14.4 10*3/uL — ABNORMAL HIGH (ref 3.6–11.0)

## 2016-11-21 LAB — LACTIC ACID, PLASMA: LACTIC ACID, VENOUS: 1.1 mmol/L (ref 0.5–1.9)

## 2016-11-21 LAB — COMPREHENSIVE METABOLIC PANEL
ALK PHOS: 64 U/L (ref 38–126)
ALT: 12 U/L — ABNORMAL LOW (ref 14–54)
ANION GAP: 5 (ref 5–15)
AST: 20 U/L (ref 15–41)
Albumin: 2.5 g/dL — ABNORMAL LOW (ref 3.5–5.0)
BILIRUBIN TOTAL: 0.8 mg/dL (ref 0.3–1.2)
BUN: 9 mg/dL (ref 6–20)
CALCIUM: 8.6 mg/dL — AB (ref 8.9–10.3)
CO2: 18 mmol/L — AB (ref 22–32)
Chloride: 117 mmol/L — ABNORMAL HIGH (ref 101–111)
Creatinine, Ser: 0.54 mg/dL (ref 0.44–1.00)
GFR calc non Af Amer: >60 mL/min (ref >60)
Glucose, Bld: 107 mg/dL — ABNORMAL HIGH (ref 65–99)
Potassium: 3.8 mmol/L (ref 3.5–5.1)
SODIUM: 140 mmol/L (ref 135–145)
TOTAL PROTEIN: 6.3 g/dL — AB (ref 6.5–8.1)

## 2016-11-21 LAB — URINALYSIS, COMPLETE (UACMP) WITH MICROSCOPIC
BILIRUBIN URINE: NEGATIVE
Bacteria, UA: NONE SEEN
Glucose, UA: NEGATIVE mg/dL
Hgb urine dipstick: NEGATIVE
KETONES UR: NEGATIVE mg/dL
Leukocytes, UA: NEGATIVE
NITRITE: NEGATIVE
PH: 5 (ref 5.0–8.0)
Protein, ur: 100 mg/dL — AB
Specific Gravity, Urine: 1.016 (ref 1.005–1.030)
Squamous Epithelial / LPF: NONE SEEN

## 2016-11-21 MED ORDER — SODIUM CHLORIDE 0.9 % IV BOLUS (SEPSIS)
250.0000 mL | Freq: Once | INTRAVENOUS | Status: AC
  Administered 2016-11-21: 250 mL via INTRAVENOUS

## 2016-11-21 MED ORDER — IPRATROPIUM-ALBUTEROL 0.5-2.5 (3) MG/3ML IN SOLN
3.0000 mL | RESPIRATORY_TRACT | 1 refills | Status: AC | PRN
Start: 2016-11-21 — End: ?

## 2016-11-21 NOTE — ED Notes (Signed)
Bladder scan 

## 2016-11-21 NOTE — Care Management Note (Signed)
Case Management Note  Patient Details  Name: Jasmine Briggs MRN: [id number] Date of Birth: June 15, 1920  Subjective/Objective:    [facility] [phone] advising him that Jasmine Briggs is being discharged back to her home from the [facility]ED this evening. Requested that an RN see Jasmine Briggs tomorrow if possible, and to provide teaching to husband about care of her decubitus ulcers.                Action/Plan:   Expected Discharge Date:                  Expected Discharge Plan:     In-House Referral:     Discharge planning Services     Post Acute Care Choice:    Choice offered to:     DME Arranged:    DME Agency:     HH Arranged:    HH Agency:     Status of Service:     If discussed at [employer] Length of Stay Meetings, dates discussed:    Additional Comments:  Mavric Cortright A, RN 11/21/2016, 4:26 PM

## 2016-11-21 NOTE — Care Management Note (Signed)
Case Management Note  Patient Details  Name: Jasmine Briggs MRN: [id number] Date of Birth: April 10, 1920  Subjective/Objective:       [facility] reporting that Ms [facility] Brig[facility]. Per Zella Ballalvin, [facility] is currently providing PT, OT, RN, Speech, and nurse aide home health services for MS Stuck[place].             Action/Plan:   Expected Discharge Date:                  Expected Discharge Plan:     In-House Referral:     Discharge planning Services     Post Acute Care Choice:    Choice offered to:     DME Arranged:    DME Agency:     HH Arranged:    HH Agency:     Status of Service:     If discussed at [employer] Length of Stay Meetings, dates discussed:    Additional Comments:  Brix Brearley A, RN 11/21/2016, 2:09 PM

## 2016-11-21 NOTE — ED Triage Notes (Signed)
Pt presents to ED via [facility] from home c/o urinary retention. [facility] for staph bacteremia from decubitus ulcers. Has unfilled Rx for IV vancomycin, PICC line in place.

## 2016-11-21 NOTE — ED Provider Notes (Signed)
[facility] Emergency Department Provider Note    First MD Initiated Contact with Patient 11/21/16 1420     (approximate)  I have reviewed the triage vital signs and the nursing notes.   HISTORY  Chief Complaint Urinary Retention  Level V Caveat:  Dementia  HPI Jasmine Briggs is a 83 y.o. female presents with chief complaint of decreased urine output. Patient brought in by family members who are primary caregivers. Patient was just discharged for bacteremia and sepsis does have a history of dementia. Over the past 24 hours they have noticed decreased urine output. She was discharged home on IV vancomycin through a PICC line. No change in her baseline mental status or no measured fevers at home. They did give dose of IV vancomycin today once. Husband is also frustrated that he was not given proper wound care instructions for her decubitus ulcers.   Past Medical History:  Diagnosis Date  . Arthritis   . Dementia   . Diabetes mellitus without complication ([place])   . Gout   . Hypertension   . Osteomyelitis ([place])    sacral wound   Family History  Problem Relation Age of Onset  . Diabetes Maternal Aunt    Past Surgical History:  Procedure Laterality Date  . CARDIAC SURGERY    . IR FLUORO GUIDE CV LINE LEFT  11/19/2016  . PACEMAKER INSERTION    . PICC line placement     Patient Active Problem List   Diagnosis Date Noted  . Advance care planning   . Goals of care, counseling/discussion   . Encephalopathy   . Protein-calorie malnutrition, severe 11/13/2016  . Acute respiratory failure with hypoxia ([place])   . MRSA bacteremia   . Palliative care encounter   . Sepsis ([place]) 11/12/2016  . Decubitus ulcer of sacral region, stage 3 ([place]) 07/26/2015  . Acute on chronic renal failure ([place]) 07/26/2015  . Hypernatremia 07/26/2015  . Hypokalemia 07/26/2015  . Encephalopathy, metabolic 00/37/0488  . Acute posthemorrhagic anemia 07/26/2015  . Gross hematuria  07/26/2015  . Generalized weakness 07/26/2015  . Dysphagia 07/26/2015  . Moderate malnutrition ([place]) 07/26/2015  . Failure to thrive in adult 07/26/2015  . Dementia 07/10/2015  . Hypotension 07/10/2015  . Acute CHF (congestive heart failure) ([place]) 07/10/2015  . Anasarca 07/10/2015  . Type 2 diabetes mellitus ([place]) 07/10/2015  . Aspiration pneumonia ([place]) 06/21/2015  . Pressure ulcer 06/14/2015  . Altered mental status 06/12/2015      Prior to Admission medications   Medication Sig Start Date End Date Taking? Authorizing Provider  acetaminophen (TYLENOL) 500 MG tablet Take 500 mg by mouth every 6 (six) hours as needed for mild pain.    Yes [provider]  ipratropium-albuterol (DUONEB) 0.5-2.5 (3) MG/3ML SOLN Take 3 mLs by nebulization every 6 (six) hours as needed. 11/19/16  Yes Gladstone Lighter, MD  rifampin (RIFADIN) 300 MG capsule Take 1 capsule (300 mg total) by mouth every 12 (twelve) hours. X 6 weeks 11/19/16  Yes Gladstone Lighter, MD  Vancomycin (VANCOCIN) 750-5 MG/150ML-% SOLN Inject 150 mLs (750 mg total) into the vein daily. 11/20/16 12/28/16 Yes Mody, Ulice Bold, MD  nicotine (NICODERM CQ - DOSED IN MG/24 HR) 7 mg/24hr patch Place 1 patch (7 mg total) onto the skin daily. Patient not taking: Reported on 11/12/2016 07/26/15   Theodoro Grist, MD  ondansetron (ZOFRAN) 4 MG tablet Take 1 tablet (4 mg total) by mouth every 6 (six) hours as needed for nausea. Patient not taking:  Reported on 11/12/2016 07/26/15   Theodoro Grist, MD  potassium chloride 20 MEQ/15ML (10%) SOLN Take 15 mLs (20 mEq total) by mouth daily. Patient not taking: Reported on 11/12/2016 07/26/15   Theodoro Grist, MD  vancomycin IVPB Inject 750 mg into the vein daily. Indication:  Coag neg staph bacteremia Last Day of Therapy:  12/28/16 Labs - Sunday/Monday:  CBC/D, BMP, and vancomycin trough. Labs - Thursday:  BMP and vancomycin trough Labs - Every other week:  ESR and CRP 11/20/16 12/28/16  Bettey Costa, MD     Allergies Patient has no known allergies.    Social History Social History  Substance Use Topics  . Smoking status: Never Smoker  . Smokeless tobacco: Former Systems developer    Types: Snuff  . Alcohol use No    Review of Systems Patient denies headaches, rhinorrhea, blurry vision, numbness, shortness of breath, chest pain, edema, cough, abdominal pain, nausea, vomiting, diarrhea, dysuria, fevers, rashes or hallucinations unless otherwise stated above in HPI. ____________________________________________   PHYSICAL EXAM:  VITAL SIGNS: Vitals:   11/21/16 1500 11/21/16 1530  BP: (!) 155/95 (!) 160/92  Pulse: 70 69  Resp: 17 (!) 21  Temp:    SpO2: 99% 98%    Constitutional: chronically ill appearing but in no Acute distress Eyes: Conjunctivae are normal.  Head: Atraumatic. Nose: No congestion/rhinnorhea. Mouth/Throat: Mucous membranes are dry.   Neck: No stridor. Painless ROM.  Cardiovascular: Normal rate, regular rhythm. Grossly normal heart sounds.  Good peripheral circulation. Respiratory: Normal respiratory effort.  No retractions. Lungs CTAB. Gastrointestinal: Soft and nontender. No distention. No abdominal bruits. No CVA tenderness. Genitourinary: suprapubic fullness but no pain Musculoskeletal: No lower extremity tenderness nor edema.  No joint effusions. Neurologic:  No new focal deficits appreciated Skin:  Skin is warm, dry,  Sacral decubitus ulcer without surrounding erythema or purulence Psychiatric: unable to assess 2/2 dementia ____________________________________________   LABS (all labs ordered are listed, but only abnormal results are displayed)  Results for orders placed or performed during the hospital encounter of 11/21/16 (from the past 24 hour(s))  CBC     Status: Abnormal   Collection Time: 11/21/16  2:34 PM  Result Value Ref Range   WBC 14.4 (H) 3.6 - 11.0 K/uL   RBC 3.26 (L) 3.80 - 5.20 MIL/uL   Hemoglobin 9.4 (L) 12.0 - 16.0 g/dL   HCT 28.2 (L)  35.0 - 47.0 %   MCV 86.5 80.0 - 100.0 fL   MCH 28.7 26.0 - 34.0 pg   MCHC 33.2 32.0 - 36.0 g/dL   RDW 16.2 (H) 11.5 - 14.5 %   Platelets 225 150 - 440 K/uL  Comprehensive metabolic panel     Status: Abnormal   Collection Time: 11/21/16  2:34 PM  Result Value Ref Range   Sodium 140 135 - 145 mmol/L   Potassium 3.8 3.5 - 5.1 mmol/L   Chloride 117 (H) 101 - 111 mmol/L   CO2 18 (L) 22 - 32 mmol/L   Glucose, Bld 107 (H) 65 - 99 mg/dL   BUN 9 6 - 20 mg/dL   Creatinine, Ser 0.54 0.44 - 1.00 mg/dL   Calcium 8.6 (L) 8.9 - 10.3 mg/dL   Total Protein 6.3 (L) 6.5 - 8.1 g/dL   Albumin 2.5 (L) 3.5 - 5.0 g/dL   AST 20 15 - 41 U/L   ALT 12 (L) 14 - 54 U/L   Alkaline Phosphatase 64 38 - 126 U/L   Total Bilirubin 0.8 0.3 - 1.2  mg/dL   GFR calc non Af Amer >60 >60 mL/min   GFR calc Af Amer >60 >60 mL/min   Anion gap 5 5 - 15  Lactic acid, plasma     Status: None   Collection Time: 11/21/16  2:34 PM  Result Value Ref Range   Lactic Acid, Venous 1.1 0.5 - 1.9 mmol/L  Urinalysis, Complete w Microscopic     Status: Abnormal   Collection Time: 11/21/16  2:34 PM  Result Value Ref Range   Color, Urine AMBER (A) YELLOW   APPearance HAZY (A) CLEAR   Specific Gravity, Urine 1.016 1.005 - 1.030   pH 5.0 5.0 - 8.0   Glucose, UA NEGATIVE NEGATIVE mg/dL   Hgb urine dipstick NEGATIVE NEGATIVE   Bilirubin Urine NEGATIVE NEGATIVE   Ketones, ur NEGATIVE NEGATIVE mg/dL   Protein, ur 100 (A) NEGATIVE mg/dL   Nitrite NEGATIVE NEGATIVE   Leukocytes, UA NEGATIVE NEGATIVE   RBC / HPF 0-5 0 - 5 RBC/hpf   WBC, UA 0-5 0 - 5 WBC/hpf   Bacteria, UA NONE SEEN NONE SEEN   Squamous Epithelial / LPF NONE SEEN NONE SEEN   Mucus PRESENT    Amorphous Crystal PRESENT    ____________________________________________ ____________________________________________  RADIOLOGY   ____________________________________________   PROCEDURES  Procedure(s) performed:  Procedures    Critical Care performed:  no ____________________________________________   INITIAL IMPRESSION / ASSESSMENT AND PLAN / ED COURSE  Pertinent labs & imaging results that were available during my care of the patient were reviewed by me and considered in my medical decision making (see chart for details).  DDX: urinary retention, uti, dehydration, aki, failure to thrive  Jasmine Briggs is a 83 y.o. who presents to the ED with urinary retention.  Patient mildly hyperthermic but otherwise at baseline according to family. Urinary catheter was inserted due to concern for retention. Adequate urine expelled. Blood work is roughly at baseline. No significant lactic acidosis. Patient is artery on IV vancomycin. After discussion with family seems that her goals of care to stay home as long as possible. Patient given a bolus of IV fluids. Otherwise stable for discharge back to home.      ____________________________________________   FINAL CLINICAL IMPRESSION(S) / ED DIAGNOSES  Final diagnoses:  Urinary retention      NEW MEDICATIONS STARTED DURING THIS VISIT:  New Prescriptions   No medications on file     Note:  This document was prepared using Dragon voice recognition software and may include unintentional dictation errors.    Merlyn Lot, MD 11/21/16 [phone]

## 2016-11-23 ENCOUNTER — Other Ambulatory Visit
Admission: RE | Admit: 2016-11-23 | Discharge: 2016-11-23 | Disposition: A | Discharge status: Home / Self Care | Payer: Medicare Other | Source: Skilled Nursing Facility | Attending: Infectious Diseases | Admitting: Infectious Diseases

## 2016-11-23 DIAGNOSIS — A499 Bacterial infection, unspecified: Secondary | ICD-10-CM | POA: Insufficient documentation

## 2016-11-23 LAB — COMPREHENSIVE METABOLIC PANEL
ALK PHOS: 62 U/L (ref 38–126)
ALT: 11 U/L — ABNORMAL LOW (ref 14–54)
ANION GAP: 5 (ref 5–15)
AST: 20 U/L (ref 15–41)
Albumin: 2.1 g/dL — ABNORMAL LOW (ref 3.5–5.0)
BUN: 10 mg/dL (ref 6–20)
CO2: 20 mmol/L — AB (ref 22–32)
Calcium: 8.7 mg/dL — ABNORMAL LOW (ref 8.9–10.3)
Chloride: 119 mmol/L — ABNORMAL HIGH (ref 101–111)
Creatinine, Ser: 0.54 mg/dL (ref 0.44–1.00)
Glucose, Bld: 145 mg/dL — ABNORMAL HIGH (ref 65–99)
Potassium: 3.6 mmol/L (ref 3.5–5.1)
SODIUM: 144 mmol/L (ref 135–145)
TOTAL PROTEIN: 5.4 g/dL — AB (ref 6.5–8.1)
Total Bilirubin: 0.5 mg/dL (ref 0.3–1.2)

## 2016-11-23 LAB — CBC WITH DIFFERENTIAL/PLATELET
Basophils Absolute: 0 10*3/uL (ref 0–0.1)
Basophils Relative: 0 %
EOS ABS: 0 10*3/uL (ref 0–0.7)
EOS PCT: 0 %
HCT: 25.6 % — ABNORMAL LOW (ref 35.0–47.0)
HEMOGLOBIN: 8.3 g/dL — AB (ref 12.0–16.0)
LYMPHS ABS: 0.7 10*3/uL — AB (ref 1.0–3.6)
Lymphocytes Relative: 5 %
MCH: 28.4 pg (ref 26.0–34.0)
MCHC: 32.5 g/dL (ref 32.0–36.0)
MCV: 87.1 fL (ref 80.0–100.0)
MONOS PCT: 11 %
Monocytes Absolute: 1.5 10*3/uL — ABNORMAL HIGH (ref 0.2–0.9)
Neutro Abs: 12 10*3/uL — ABNORMAL HIGH (ref 1.4–6.5)
Neutrophils Relative %: 84 %
Platelets: 214 10*3/uL (ref 150–440)
RBC: 2.94 MIL/uL — ABNORMAL LOW (ref 3.80–5.20)
RDW: 16 % — ABNORMAL HIGH (ref 11.5–14.5)
WBC: 14.3 10*3/uL — ABNORMAL HIGH (ref 3.6–11.0)

## 2016-11-23 LAB — VANCOMYCIN, TROUGH: VANCOMYCIN TR: 20 ug/mL (ref 15–20)

## 2016-11-25 ENCOUNTER — Telehealth: Payer: Self-pay | Admitting: Emergency Medicine

## 2016-11-25 NOTE — Telephone Encounter (Signed)
Pt son called because someone called him yesterday about skipping a dose of antibiotic and he was concerned that the doctor thought her appointment was on 9/4 instead of 10/4.  I reviewed chart and advised him to call the home health nurse to verify whether a dose of antibiotic should be skipped.  He agrees and he has their number.

## 2016-12-25 ENCOUNTER — Other Ambulatory Visit
Admission: RE | Admit: 2016-12-25 | Discharge: 2016-12-25 | Disposition: A | Discharge status: Home / Self Care | Payer: Medicare Other | Source: Ambulatory Visit | Attending: Internal Medicine | Admitting: Internal Medicine

## 2016-12-28 ENCOUNTER — Emergency Department: Payer: Medicare Other

## 2016-12-28 ENCOUNTER — Inpatient Hospital Stay
Admission: EM | Admit: 2016-12-28 | Discharge: 2017-01-02 | DRG: 689 | Disposition: A | Discharge status: Home Health | Payer: Medicare Other | Attending: Internal Medicine | Admitting: Internal Medicine

## 2016-12-28 DIAGNOSIS — Z7401 Bed confinement status: Secondary | ICD-10-CM

## 2016-12-28 DIAGNOSIS — F0390 Unspecified dementia without behavioral disturbance: Secondary | ICD-10-CM | POA: Diagnosis present

## 2016-12-28 DIAGNOSIS — G3183 Dementia with Lewy bodies: Secondary | ICD-10-CM | POA: Diagnosis not present

## 2016-12-28 DIAGNOSIS — Z23 Encounter for immunization: Secondary | ICD-10-CM | POA: Diagnosis present

## 2016-12-28 DIAGNOSIS — Z515 Encounter for palliative care: Secondary | ICD-10-CM | POA: Diagnosis not present

## 2016-12-28 DIAGNOSIS — I10 Essential (primary) hypertension: Secondary | ICD-10-CM | POA: Diagnosis present

## 2016-12-28 DIAGNOSIS — Z9189 Other specified personal risk factors, not elsewhere classified: Secondary | ICD-10-CM

## 2016-12-28 DIAGNOSIS — E876 Hypokalemia: Secondary | ICD-10-CM | POA: Diagnosis not present

## 2016-12-28 DIAGNOSIS — G9341 Metabolic encephalopathy: Secondary | ICD-10-CM | POA: Diagnosis present

## 2016-12-28 DIAGNOSIS — L89159 Pressure ulcer of sacral region, unspecified stage: Secondary | ICD-10-CM | POA: Diagnosis present

## 2016-12-28 DIAGNOSIS — M109 Gout, unspecified: Secondary | ICD-10-CM | POA: Diagnosis present

## 2016-12-28 DIAGNOSIS — Z7189 Other specified counseling: Secondary | ICD-10-CM | POA: Diagnosis not present

## 2016-12-28 DIAGNOSIS — K648 Other hemorrhoids: Secondary | ICD-10-CM | POA: Diagnosis present

## 2016-12-28 DIAGNOSIS — Z950 Presence of cardiac pacemaker: Secondary | ICD-10-CM

## 2016-12-28 DIAGNOSIS — D638 Anemia in other chronic diseases classified elsewhere: Secondary | ICD-10-CM | POA: Diagnosis present

## 2016-12-28 DIAGNOSIS — M4628 Osteomyelitis of vertebra, sacral and sacrococcygeal region: Secondary | ICD-10-CM | POA: Diagnosis present

## 2016-12-28 DIAGNOSIS — Z681 Body mass index (BMI) 19 or less, adult: Secondary | ICD-10-CM | POA: Diagnosis not present

## 2016-12-28 DIAGNOSIS — B9620 Unspecified Escherichia coli [E. coli] as the cause of diseases classified elsewhere: Secondary | ICD-10-CM | POA: Diagnosis present

## 2016-12-28 DIAGNOSIS — J189 Pneumonia, unspecified organism: Secondary | ICD-10-CM | POA: Diagnosis not present

## 2016-12-28 DIAGNOSIS — E43 Unspecified severe protein-calorie malnutrition: Secondary | ICD-10-CM | POA: Diagnosis present

## 2016-12-28 DIAGNOSIS — K117 Disturbances of salivary secretion: Secondary | ICD-10-CM | POA: Diagnosis not present

## 2016-12-28 DIAGNOSIS — A419 Sepsis, unspecified organism: Secondary | ICD-10-CM | POA: Diagnosis not present

## 2016-12-28 DIAGNOSIS — Z66 Do not resuscitate: Secondary | ICD-10-CM | POA: Diagnosis present

## 2016-12-28 DIAGNOSIS — K56609 Unspecified intestinal obstruction, unspecified as to partial versus complete obstruction: Secondary | ICD-10-CM

## 2016-12-28 DIAGNOSIS — R4182 Altered mental status, unspecified: Secondary | ICD-10-CM

## 2016-12-28 DIAGNOSIS — M1990 Unspecified osteoarthritis, unspecified site: Secondary | ICD-10-CM | POA: Diagnosis present

## 2016-12-28 DIAGNOSIS — E119 Type 2 diabetes mellitus without complications: Secondary | ICD-10-CM | POA: Diagnosis present

## 2016-12-28 DIAGNOSIS — R0902 Hypoxemia: Secondary | ICD-10-CM | POA: Diagnosis not present

## 2016-12-28 DIAGNOSIS — I959 Hypotension, unspecified: Secondary | ICD-10-CM | POA: Diagnosis not present

## 2016-12-28 DIAGNOSIS — K5641 Fecal impaction: Secondary | ICD-10-CM | POA: Diagnosis present

## 2016-12-28 DIAGNOSIS — N390 Urinary tract infection, site not specified: Secondary | ICD-10-CM | POA: Diagnosis present

## 2016-12-28 DIAGNOSIS — F0280 Dementia in other diseases classified elsewhere without behavioral disturbance: Secondary | ICD-10-CM | POA: Diagnosis not present

## 2016-12-28 DIAGNOSIS — D62 Acute posthemorrhagic anemia: Secondary | ICD-10-CM | POA: Diagnosis present

## 2016-12-28 DIAGNOSIS — Z87891 Personal history of nicotine dependence: Secondary | ICD-10-CM

## 2016-12-28 DIAGNOSIS — Z833 Family history of diabetes mellitus: Secondary | ICD-10-CM | POA: Diagnosis not present

## 2016-12-28 DIAGNOSIS — H547 Unspecified visual loss: Secondary | ICD-10-CM | POA: Diagnosis present

## 2016-12-28 LAB — URINALYSIS, COMPLETE (UACMP) WITH MICROSCOPIC
BILIRUBIN URINE: NEGATIVE
GLUCOSE, UA: 50 mg/dL — AB
Ketones, ur: 5 mg/dL — AB
NITRITE: NEGATIVE
PH: 5 (ref 5.0–8.0)
Protein, ur: 100 mg/dL — AB
SPECIFIC GRAVITY, URINE: 1.023 (ref 1.005–1.030)

## 2016-12-28 LAB — CBC
HCT: 27.7 % — ABNORMAL LOW (ref 35.0–47.0)
Hemoglobin: 9.3 g/dL — ABNORMAL LOW (ref 12.0–16.0)
MCH: 28.4 pg (ref 26.0–34.0)
MCHC: 33.5 g/dL (ref 32.0–36.0)
MCV: 84.7 fL (ref 80.0–100.0)
PLATELETS: 224 10*3/uL (ref 150–440)
RBC: 3.27 MIL/uL — ABNORMAL LOW (ref 3.80–5.20)
RDW: 18.4 % — AB (ref 11.5–14.5)
WBC: 16.8 10*3/uL — AB (ref 3.6–11.0)

## 2016-12-28 LAB — COMPREHENSIVE METABOLIC PANEL
ALT: 12 U/L — ABNORMAL LOW (ref 14–54)
AST: 22 U/L (ref 15–41)
Albumin: 1.9 g/dL — ABNORMAL LOW (ref 3.5–5.0)
Alkaline Phosphatase: 113 U/L (ref 38–126)
Anion gap: 8 (ref 5–15)
BUN: 11 mg/dL (ref 6–20)
CHLORIDE: 108 mmol/L (ref 101–111)
CO2: 26 mmol/L (ref 22–32)
Calcium: 8.7 mg/dL — ABNORMAL LOW (ref 8.9–10.3)
Creatinine, Ser: 0.76 mg/dL (ref 0.44–1.00)
GFR calc Af Amer: >60 mL/min (ref >60)
Glucose, Bld: 181 mg/dL — ABNORMAL HIGH (ref 65–99)
POTASSIUM: 2.2 mmol/L — AB (ref 3.5–5.1)
Sodium: 142 mmol/L (ref 135–145)
Total Bilirubin: 0.8 mg/dL (ref 0.3–1.2)
Total Protein: 6.1 g/dL — ABNORMAL LOW (ref 6.5–8.1)

## 2016-12-28 LAB — MAGNESIUM: Magnesium: 2.2 mg/dL (ref 1.7–2.4)

## 2016-12-28 LAB — TROPONIN I: TROPONIN I: 0.03 ng/mL — AB (ref <0.03)

## 2016-12-28 LAB — GLUCOSE, CAPILLARY
GLUCOSE-CAPILLARY: 96 mg/dL (ref 65–99)
Glucose-Capillary: 150 mg/dL — ABNORMAL HIGH (ref 65–99)

## 2016-12-28 LAB — LACTIC ACID, PLASMA: LACTIC ACID, VENOUS: 1.6 mmol/L (ref 0.5–1.9)

## 2016-12-28 MED ORDER — INSULIN ASPART 100 UNIT/ML ~~LOC~~ SOLN
0.0000 [IU] | Freq: Three times a day (TID) | SUBCUTANEOUS | Status: DC
  Administered 2016-12-28: 17:00:00 1 [IU] via SUBCUTANEOUS
  Filled 2016-12-28: qty 1

## 2016-12-28 MED ORDER — POTASSIUM CHLORIDE IN NACL 40-0.9 MEQ/L-% IV SOLN
INTRAVENOUS | Status: DC
  Administered 2016-12-28 – 2016-12-31 (×5): 75 mL/h via INTRAVENOUS
  Filled 2016-12-28 (×9): qty 1000

## 2016-12-28 MED ORDER — ONDANSETRON HCL 4 MG/2ML IJ SOLN: 4.0000 mg | Freq: Four times a day (QID) | INTRAMUSCULAR | Status: DC | PRN

## 2016-12-28 MED ORDER — ONDANSETRON HCL 4 MG PO TABS: 4.0000 mg | ORAL_TABLET | Freq: Four times a day (QID) | ORAL | Status: DC | PRN

## 2016-12-28 MED ORDER — DEXTROSE 5 % IV SOLN
1.0000 g | INTRAVENOUS | Status: AC
  Administered 2016-12-29 – 2016-12-30 (×2): 1 g via INTRAVENOUS
  Filled 2016-12-28 (×2): qty 10

## 2016-12-28 MED ORDER — POTASSIUM CHLORIDE 10 MEQ/100ML IV SOLN
10.0000 meq | INTRAVENOUS | Status: AC
  Administered 2016-12-28 – 2016-12-29 (×6): 10 meq via INTRAVENOUS
  Filled 2016-12-28 (×6): qty 100

## 2016-12-28 MED ORDER — HYDRALAZINE HCL 20 MG/ML IJ SOLN: 10.0000 mg | Freq: Four times a day (QID) | INTRAMUSCULAR | Status: DC | PRN

## 2016-12-28 MED ORDER — BISACODYL 10 MG RE SUPP: 10.0000 mg | Freq: Every day | RECTAL | Status: DC | PRN

## 2016-12-28 MED ORDER — IOPAMIDOL (ISOVUE-300) INJECTION 61%
75.0000 mL | Freq: Once | INTRAVENOUS | Status: AC | PRN
  Administered 2016-12-28: 75 mL via INTRAVENOUS

## 2016-12-28 MED ORDER — CEFTRIAXONE SODIUM IN DEXTROSE 20 MG/ML IV SOLN
1.0000 g | Freq: Once | INTRAVENOUS | Status: AC
  Administered 2016-12-28: 1 g via INTRAVENOUS
  Filled 2016-12-28: qty 50

## 2016-12-28 MED ORDER — SODIUM CHLORIDE 0.9 % IV BOLUS (SEPSIS)
1000.0000 mL | Freq: Once | INTRAVENOUS | Status: AC
  Administered 2016-12-28: 1000 mL via INTRAVENOUS

## 2016-12-28 MED ORDER — POTASSIUM CHLORIDE 20 MEQ/15ML (10%) PO SOLN: 20.0000 meq | Freq: Two times a day (BID) | ORAL | Status: DC

## 2016-12-28 MED ORDER — ACETAMINOPHEN 325 MG PO TABS: 650.0000 mg | ORAL_TABLET | Freq: Four times a day (QID) | ORAL | Status: DC | PRN

## 2016-12-28 MED ORDER — VANCOMYCIN HCL IN DEXTROSE 1-5 GM/200ML-% IV SOLN
1000.0000 mg | Freq: Once | INTRAVENOUS | Status: AC
  Administered 2016-12-28: 1000 mg via INTRAVENOUS
  Filled 2016-12-28: qty 200

## 2016-12-28 MED ORDER — INSULIN ASPART 100 UNIT/ML ~~LOC~~ SOLN: 0.0000 [IU] | Freq: Every day | SUBCUTANEOUS | Status: DC

## 2016-12-28 MED ORDER — VANCOMYCIN HCL 10 G IV SOLR
1250.0000 mg | INTRAVENOUS | Status: DC
  Administered 2016-12-29: 10:00:00 1250 mg via INTRAVENOUS
  Filled 2016-12-28 (×2): qty 1250

## 2016-12-28 MED ORDER — ENOXAPARIN SODIUM 40 MG/0.4ML ~~LOC~~ SOLN
40.0000 mg | SUBCUTANEOUS | Status: DC
  Administered 2016-12-28 – 2016-12-29 (×2): 40 mg via SUBCUTANEOUS
  Filled 2016-12-28 (×2): qty 0.4

## 2016-12-28 MED ORDER — ALBUTEROL SULFATE (2.5 MG/3ML) 0.083% IN NEBU: 2.5000 mg | INHALATION_SOLUTION | RESPIRATORY_TRACT | Status: DC | PRN

## 2016-12-28 MED ORDER — INFLUENZA VAC SPLIT HIGH-DOSE 0.5 ML IM SUSY
0.5000 mL | PREFILLED_SYRINGE | INTRAMUSCULAR | Status: AC
  Administered 2016-12-30: 16:00:00 0.5 mL via INTRAMUSCULAR
  Filled 2016-12-28 (×2): qty 0.5

## 2016-12-28 MED ORDER — ACETAMINOPHEN 650 MG RE SUPP: 650.0000 mg | Freq: Four times a day (QID) | RECTAL | Status: DC | PRN

## 2016-12-28 MED ORDER — RIFAMPIN 300 MG PO CAPS
300.0000 mg | ORAL_CAPSULE | Freq: Two times a day (BID) | ORAL | Status: DC
  Filled 2016-12-28: qty 2
  Filled 2016-12-28 (×2): qty 1

## 2016-12-28 MED ORDER — SENNOSIDES-DOCUSATE SODIUM 8.6-50 MG PO TABS: 1.0000 | ORAL_TABLET | Freq: Every evening | ORAL | Status: DC | PRN

## 2016-12-28 NOTE — H&P (Signed)
[facility] [place] NAME: Jasmine Briggs    MR#:  [id number]  DATE OF BIRTH:  06-07-1920  DATE OF ADMISSION:  12/28/2016  PRIMARY CARE PHYSICIAN: Caryl Never, MD   REQUESTING/REFERRING PHYSICIAN: Harvest Vanroekel, MD  CHIEF COMPLAINT:   Chief Complaint  Patient presents with  . Altered Mental Status   Altered mental status for 2 days. HISTORY OF PRESENT ILLNESS:  Jasmine Briggs  is a 83 y.o. female with a known history of Hypertension, diabetes, gout, sacral DU infection with osteomyelitis and dementia. The patient was recently admitted here for sacral DU infection with osteomyelitis, has been on vancomycin and cefepime per Dr. Ola Spurr. She was found more confused and sent from home to ED. She was found UTI with leukocytosis, treated with antibiotics in the ED. CAT scan of abdomen shows small bowel obstruction as well as stool retention.  PAST MEDICAL HISTORY:   Past Medical History:  Diagnosis Date  . Arthritis   . Dementia   . Diabetes mellitus without complication ([place])   . Gout   . Hypertension   . Osteomyelitis ([place])    sacral wound    PAST SURGICAL HISTORY:   Past Surgical History:  Procedure Laterality Date  . CARDIAC SURGERY    . IR FLUORO GUIDE CV LINE LEFT  11/19/2016  . PACEMAKER INSERTION    . PICC line placement      SOCIAL HISTORY:   Social History  Substance Use Topics  . Smoking status: Never Smoker  . Smokeless tobacco: Former Systems developer    Types: Snuff  . Alcohol use No    FAMILY HISTORY:   Family History  Problem Relation Age of Onset  . Diabetes Maternal Aunt     DRUG ALLERGIES:  No Known Allergies  REVIEW OF SYSTEMS:   Review of Systems  Unable to perform ROS: Mental status change    MEDICATIONS AT HOME:   Prior to Admission medications   Medication Sig Start Date End Date Taking? Authorizing Provider  ipratropium-albuterol (DUONEB) 0.5-2.5 (3) MG/3ML SOLN Take 3 mLs by  nebulization every 4 (four) hours as needed. 11/21/16  Yes Merlyn Lot, MD  rifampin (RIFADIN) 300 MG capsule Take 1 capsule (300 mg total) by mouth every 12 (twelve) hours. X 6 weeks 11/19/16  Yes Gladstone Lighter, MD  vancomycin IVPB Inject 750 mg into the vein daily. Indication:  Coag neg staph bacteremia Last Day of Therapy:  12/28/16 Labs - Sunday/Monday:  CBC/D, BMP, and vancomycin trough. Labs - Thursday:  BMP and vancomycin trough Labs - Every other week:  ESR and CRP 11/20/16 12/28/16 Yes Mody, Sital, MD  acetaminophen (TYLENOL) 500 MG tablet Take 500 mg by mouth every 6 (six) hours as needed for mild pain.     [provider]  nicotine (NICODERM CQ - DOSED IN MG/24 HR) 7 mg/24hr patch Place 1 patch (7 mg total) onto the skin daily. Patient not taking: Reported on 11/12/2016 07/26/15   Theodoro Grist, MD  ondansetron (ZOFRAN) 4 MG tablet Take 1 tablet (4 mg total) by mouth every 6 (six) hours as needed for nausea. Patient not taking: Reported on 11/12/2016 07/26/15   Theodoro Grist, MD  potassium chloride 20 MEQ/15ML (10%) SOLN Take 15 mLs (20 mEq total) by mouth daily. Patient not taking: Reported on 11/12/2016 07/26/15   Theodoro Grist, MD      VITAL SIGNS:  Blood pressure 137/61, pulse 70, temperature 97.9 F (36.6 C), temperature source  Axillary, resp. rate 15, height 5' 4"  (1.626 m), weight 110 lb (49.9 kg), SpO2 100 %.  PHYSICAL EXAMINATION:  Physical Exam  GENERAL:  83 y.o.-year-old patient lying in the bed with no acute distress.  EYES: Pupils equal, round, reactive to light and accommodation. No scleral icterus. Extraocular muscles intact.  HEENT: Head atraumatic, normocephalic. Oropharynx and nasopharynx clear.  NECK:  Supple, no jugular venous distention. No thyroid enlargement, no tenderness.  LUNGS: Normal breath sounds bilaterally, no wheezing, rales,rhonchi or crepitation. No use of accessory muscles of respiration.  CARDIOVASCULAR: S1, S2 normal. No murmurs,  rubs, or gallops.  ABDOMEN: Soft, nontender, nondistended. Bowel sounds present. No organomegaly or mass.  EXTREMITIES: No pedal edema, cyanosis, or clubbing.  NEUROLOGIC: Cranial nerves II through XII are intact. Muscle strength 5/5 in all extremities. Sensation intact. Gait not checked.  PSYCHIATRIC: The patient is alert and oriented x 3.  SKIN: No obvious rash, lesion, or ulcer.   LABORATORY PANEL:   CBC  Recent Labs Lab 12/28/16 0748  WBC 16.8*  HGB 9.3*  HCT 27.7*  PLT 224   ------------------------------------------------------------------------------------------------------------------  Chemistries   Recent Labs Lab 12/28/16 0748  NA 142  K 2.2*  CL 108  CO2 26  GLUCOSE 181*  BUN 11  CREATININE 0.76  CALCIUM 8.7*  AST 22  ALT 12*  ALKPHOS 113  BILITOT 0.8   ------------------------------------------------------------------------------------------------------------------  Cardiac Enzymes  Recent Labs Lab 12/28/16 0748  TROPONINI 0.03*   ------------------------------------------------------------------------------------------------------------------  RADIOLOGY:  Ct Head Wo Contrast  Result Date: 12/28/2016 CLINICAL DATA:  Encephalopathy EXAM: CT HEAD WITHOUT CONTRAST TECHNIQUE: Contiguous axial images were obtained from the base of the skull through the vertex without intravenous contrast. COMPARISON:  06/12/2015 FINDINGS: Brain: There is atrophy and chronic small vessel disease changes. No acute intracranial abnormality. Specifically, no hemorrhage, hydrocephalus, mass lesion, acute infarction, or significant intracranial injury. Vascular: No hyperdense vessel or unexpected calcification. Skull: No acute calvarial abnormality. Sinuses/Orbits: Visualized paranasal sinuses and mastoids clear. Orbital soft tissues unremarkable. Other: None IMPRESSION: No acute intracranial abnormality. Atrophy, chronic microvascular disease. Electronically Signed   By: Rolm Baptise M.D.   On: 12/28/2016 08:46   Ct Abdomen Pelvis W Contrast  Result Date: 12/28/2016 CLINICAL DATA:  Encephalopathy. Sacral decubitus ulcer. Chronic Foley with cloudy urine. EXAM: CT ABDOMEN AND PELVIS WITH CONTRAST TECHNIQUE: Multidetector CT imaging of the abdomen and pelvis was performed using the standard protocol following bolus administration of intravenous contrast. CONTRAST:  34m ISOVUE-300 IOPAMIDOL (ISOVUE-300) INJECTION 61% COMPARISON:  None. FINDINGS: Lower chest: Partially visualized bilateral pleural effusions with compressive atelectasis in the left lower lobe and atelectasis or consolidation in the right lower lobe. Cardiomegaly. Pacemaker leads. Hepatobiliary: Multiple hypodense lesions scattered in both hepatic lobes with the largest measuring 11 mm in segment IVa and 12 mm in segment VI. These likely represent cysts though are predominantly too small to fully characterize. Small calcifications in the right hepatic lobe. Grossly unremarkable gallbladder. No biliary dilatation. Pancreas: Unremarkable. Spleen: Unremarkable. Adrenals/Urinary Tract: Unremarkable adrenal glands. Small nonobstructing calculi in both kidneys 5 mm. Low density lesion in the lower pole of the right kidney, too small to fully characterize. Bladder decompressed by a Foley catheter. Stomach/Bowel: Mild proximal distension of the stomach by fluid. Small bowel loops are fluid-filled and diffusely dilated throughout the abdomen, measuring up to approximately 4 cm in diameter. There is a transition point in the central abdomen just left of midline (series 3, image 47) with the more distal ileum being completely  decompressed. A moderate amount of stool is present throughout the colon, and there is a large amount of stool within the distended rectum. The appendix is unremarkable. Vascular/Lymphatic: Abdominal aortic atherosclerosis without aneurysm. No enlarged lymph nodes. Reproductive: Status post hysterectomy. No  adnexal masses. Other: Small volume ascites. No loculated fluid collection. No pneumoperitoneum. Diffuse body wall edema. Musculoskeletal: Decubitus ulcer over the lower sacrum/coccyx with a small amount of subcutaneous gas to the left of the tip of the coccyx without drainable fluid collection. Osseous demineralization without gross focal osseous destructive changes. Advanced lower lumbar facet arthrosis with grade 2 anterolisthesis of L4 on L5. Degenerative changes of the right greater than left hips. IMPRESSION: 1. Moderate diffuse small bowel dilatation with transition to decompressed distal ileum, concerning for obstruction. 2. Moderate colonic stool burden. Large amount of rectal stool concerning for impaction. 3. Sacral decubitus ulcer.  No abscess. 4. Partially visualized bilateral pleural effusions and right greater than left lower lung atelectasis/consolidation. 5. Anasarca. 6. Bilateral nonobstructing nephrolithiasis. 7.  Aortic Atherosclerosis (ICD10-I70.0). Electronically Signed   By: Logan Bores M.D.   On: 12/28/2016 11:44   Dg Chest Portable 1 View  Result Date: 12/28/2016 CLINICAL DATA:  Altered mental status. EXAM: PORTABLE CHEST 1 VIEW COMPARISON:  Radiograph of November 14, 2016. FINDINGS: Stable cardiomegaly. Right-sided pacemaker is unchanged. No pneumothorax is noted. Minimal left basilar subsegmental atelectasis or scarring is noted. Improved opacity is noted in the right upper and lower lobes concerning for improved edema or infiltrate. Mild right pleural effusion remains. Stable chronic dislocation of right shoulder is noted. IMPRESSION: Decreased right lung opacity is noted concerning for improved edema or infiltrate, with mild right pleural effusion present. Electronically Signed   By: Marijo Conception, M.D.   On: 12/28/2016 09:07      IMPRESSION AND PLAN:   UTI with leukocytosis. The patient will be admitted to medical floor. Start on Rocephin, follow-up CBC and  cultures.  Acute mental encephalopathy due to above. Aspiration and fall precaution.  Small bowel obstruction. Nothing by mouth with IV fluid to support, follow-up as surgical consult.  Severe hypokalemia. Potassium 2.2. Start IVF with potassium. Sacral DU infection with osteomyelitis. Continue vancomycin and rifampin, ID consult. Wound care.  Dementia. Aspiration and fall precaution. Diabetes. Sliding scale. Hypertension. IV hydralazine when necessary. The patient has very poor prognosis. Palliative care consult.  All the records are reviewed and case discussed with ED provider. Management plans discussed with the patient's son and they are in agreement.  CODE STATUS: DO NOT RESUSCITATE  TOTAL TIME TAKING CARE OF THIS PATIENT: 52 minutes.    Demetrios Loll M.D on 12/28/2016 at 2:09 PM  Between 7am to 6pm - Pager - [phone]  After 6pm go to [email or web address] - Proofreader  [facility]  Office  [phone]  CC: Primary care physician; Caryl Never, MD   Note: This dictation was prepared with Dragon dictation along with smaller phrase technology. Any transcriptional errors that result from this process are unin

## 2016-12-28 NOTE — Progress Notes (Addendum)
Pharmacy Antibiotic Note  Jasmine Briggs is a 83 y.o. female admitted on 12/28/2016 with UTI and osteomyelitis.  Pharmacy has been consulted for ceftriaxone and vancomycin dosing.  Plan: Vancomycin  IV every 48 hours.  Goal trough 15-20 mcg/mL. ceftriaxone 1gm iv q24h  Vancomycin trough before 3rd dose of , 10/13@0930  Height:  (162.6 cm) Weight: 110 lb (49.9 kg) IBW/kg (Calculated) : 54.7  Temp (24hrs), Avg:97.5 F (36.4 C), Min:96.5 F (35.8 C), Max:97.9 F (36.6 C) ke 0.029  T1/2 23.9hrs Weight 49.9kg  crcl: 79ml/min  Recent Labs Lab 12/28/16 0748  WBC 16.8*  CREATININE 0.76  LATICACIDVEN 1.6    Estimated Creatinine Clearance: 34.6 mL/min (by C-G formula based on SCr of 0.76 mg/dL).    No Known Allergies  Antimicrobials this admission: 10/8 ceftriaxone >>  10/8 vancomycin >>    Microbiology results: No results found for this or any previous visit (from the past 240 hour(s)).  Thank you for allowing pharmacy to be a part of this patient's care.  Gerre Pebbles [place] 12/28/2016 4:03 PM

## 2016-12-28 NOTE — Progress Notes (Signed)
MD notified of low potassium of 2.2

## 2016-12-28 NOTE — Progress Notes (Signed)
Unable to obtain second set of blood cultures. Lab called and notified and will send lab tech to obtain.

## 2016-12-28 NOTE — Progress Notes (Signed)
No urine return since placement of new urethral catheter.  Amber, RN at bedside to check visual placement. Bladder scan reveals 40-15ml of urine in bladder.  Dr. Gwinda Passe notified of no urine return.  Per MD leave foley in place and monitor for urine over the next hour.

## 2016-12-28 NOTE — ED Provider Notes (Signed)
[facility] Emergency Department Provider Note  Time seen: 7:35 AM  I have reviewed the triage vital signs and the nursing notes.   HISTORY  Chief Complaint No chief complaint on file.    HPI Jasmine Briggs is a 83 y.o. female With a past medical history of dementia, diabetes, hypertension, sacral osteomyelitis/ulcer, presents to the emergency department with altered mental status.according to EMS report the son who cares for the patient at home. States this morning he woke found the patient less responsive. Patient has her eyes open but is not responding, not following commands or answering questions. Per EMS the son did not know much of the patient's history, has a left upper extremity PICC line, presumably for osteomyelitis involving the sacral ulcer.  currently awaiting son's arrival to the emergency department for further history.  Past Medical History:  Diagnosis Date  . Arthritis   . Dementia   . Diabetes mellitus without complication ([place])   . Gout   . Hypertension   . Osteomyelitis ([place])    sacral wound    Patient Active Problem List   Diagnosis Date Noted  . Advance care planning   . Goals of care, counseling/discussion   . Encephalopathy   . Protein-calorie malnutrition, severe 11/13/2016  . Acute respiratory failure with hypoxia ([place])   . MRSA bacteremia   . Palliative care encounter   . Sepsis ([place]) 11/12/2016  . Decubitus ulcer of sacral region, stage 3 ([place]) 07/26/2015  . Acute on chronic renal failure ([place]) 07/26/2015  . Hypernatremia 07/26/2015  . Hypokalemia 07/26/2015  . Encephalopathy, metabolic 04/54/0981  . Acute posthemorrhagic anemia 07/26/2015  . Gross hematuria 07/26/2015  . Generalized weakness 07/26/2015  . Dysphagia 07/26/2015  . Moderate malnutrition ([place]) 07/26/2015  . Failure to thrive in adult 07/26/2015  . Dementia 07/10/2015  . Hypotension 07/10/2015  . Acute CHF (congestive heart failure) ([place]) 07/10/2015  .  Anasarca 07/10/2015  . Type 2 diabetes mellitus ([place]) 07/10/2015  . Aspiration pneumonia ([place]) 06/21/2015  . Pressure ulcer 06/14/2015  . Altered mental status 06/12/2015    Past Surgical History:  Procedure Laterality Date  . CARDIAC SURGERY    . IR FLUORO GUIDE CV LINE LEFT  11/19/2016  . PACEMAKER INSERTION    . PICC line placement      Prior to Admission medications   Medication Sig Start Date End Date Taking? Authorizing Provider  acetaminophen (TYLENOL) 500 MG tablet Take 500 mg by mouth every 6 (six) hours as needed for mild pain.     [provider]  ipratropium-albuterol (DUONEB) 0.5-2.5 (3) MG/3ML SOLN Take 3 mLs by nebulization every 6 (six) hours as needed. 11/19/16   Gladstone Lighter, MD  ipratropium-albuterol (DUONEB) 0.5-2.5 (3) MG/3ML SOLN Take 3 mLs by nebulization every 4 (four) hours as needed. 11/21/16   Merlyn Lot, MD  nicotine (NICODERM CQ - DOSED IN MG/24 HR) 7 mg/24hr patch Place 1 patch (7 mg total) onto the skin daily. Patient not taking: Reported on 11/12/2016 07/26/15   Theodoro Grist, MD  ondansetron (ZOFRAN) 4 MG tablet Take 1 tablet (4 mg total) by mouth every 6 (six) hours as needed for nausea. Patient not taking: Reported on 11/12/2016 07/26/15   Theodoro Grist, MD  potassium chloride 20 MEQ/15ML (10%) SOLN Take 15 mLs (20 mEq total) by mouth daily. Patient not taking: Reported on 11/12/2016 07/26/15   Theodoro Grist, MD  rifampin (RIFADIN) 300 MG capsule Take 1 capsule (300 mg total) by mouth every 12 (  twelve) hours. X 6 weeks 11/19/16   Gladstone Lighter, MD  Vancomycin (VANCOCIN) 750-5 MG/150ML-% SOLN Inject 150 mLs (750 mg total) into the vein daily. 11/20/16 12/28/16  Bettey Costa, MD  vancomycin IVPB Inject 750 mg into the vein daily. Indication:  Coag neg staph bacteremia Last Day of Therapy:  12/28/16 Labs - Sunday/Monday:  CBC/D, BMP, and vancomycin trough. Labs - Thursday:  BMP and vancomycin trough Labs - Every other week:  ESR and CRP  11/20/16 12/28/16  Bettey Costa, MD    No Known Allergies  Family History  Problem Relation Age of Onset  . Diabetes Maternal Aunt     Social History Social History  Substance Use Topics  . Smoking status: Never Smoker  . Smokeless tobacco: Former Systems developer    Types: Snuff  . Alcohol use No    Review of Systems unable to obtain an adequate/accurate review of systems due to significant dementia and altered mental status ____________________________________________   PHYSICAL EXAM:  Constitutional: patient is awake, will occasionally look around, but is not answering questions, not following commands. Will occasionally moan to discomfort Eyes: eyes are open, overall normal appearance ENT   Head: Normocephalic and atraumatic.   Mouth/Throat: somewhat dry appearing mucous membranes Cardiovascular: Normal rate, regular rhythm. Respiratory: normal respiratory effort without tachypnea however the patient does have diffuse rhonchi on exam. Gastrointestinal: Soft and nontender. no reaction to abdominal palpation. No significant distention. Musculoskeletal: patient has a large approximate 10 cm diameter sacral ulcer.extremities are somewhat contracted. Neurologic:  appears to be able to move all extremities at times, not following commands unable to perform an adequate neurological exam. Skin:  Skin is warm, approximate 10 cm or larger sacral ulcer. Psychiatric: altered, not responding to verbal stimuli or commands.  ____________________________________________    RADIOLOGY  CT head negative  ____________________________________________   INITIAL IMPRESSION / ASSESSMENT AND PLAN / ED COURSE  Pertinent labs & imaging results that were available during my care of the patient were reviewed by me and considered in my medical decision making (see chart for details).  the patient presents to the emergency department for altered mental status.differential this time is quite broad  but would include encephalopathy, pneumonia, urinary tract infection, CVA, ICH, metabolic abnormality, advanced dementia. We will check labs, chest x-ray, CT scan of the head. Patient has a chronic indwelling Foley catheter that appears to have pus within the line. We will remove the Foley catheter exchange with a new one. We will check cultures. Currently awaiting the son's arrival for further history. I have reviewed the patient's records including her recent discharge summary 11/20/16 after an admission for encephalopathy, respiratory failure and sepsis.patient found to have sepsis with bacteremia likely as a result of her decubitus ulcer, with follow-up with Dr. Ola Spurr. Receiving oral rifampin and IV vancomycin. record review also shows the patient currently has home health care including nurse agents, physical therapy, occupational therapy and a home health nurse.  patient's labs show a leukocytosis of 16,000, labs otherwise largely at baseline. CT scan of head is normal. X-ray shows right-sided infiltrates/edema but this appears to be improved from prior. We are not getting any urine out of the replace Foley catheter. I used a bedside ultrasound to confirm that the balloon does appear to be in the correct place with minimal amount of urine in the bladder. While performing the ultrasound the patient's abdomen is mildly distended. She appears to have fluid filled loops of bowel on ultrasound. Given her leukocytosis with altered  mental status and abnormal ultrasound we'll obtain a CT scan of the abdomen for further evaluation. Family agreeable.  CT scan shows likely small bowel obstruction as well as stool retention. Discussed with general surgery currently awaiting for him to get out of the operating room to review the CT. As the patient is a poor surgical candidate at baseline we will admit to medicine. We'll treat with antibiotics. I discussed the findings with the patient as well as poor prognosis,  they are waiting further family arrival to make decisions as far as how aggressive they would like to treat this, comfort care, etc.  ____________________________________________   FINAL CLINICAL IMPRESSION(S) / ED DIAGNOSES  small bowel obstruction Urinary tract infection  altered mental status    Harvest Stickle, MD 12/28/16 1245

## 2016-12-28 NOTE — Progress Notes (Signed)
Bair hugger requested for rectal temperature of 96.5.  Wet to dry dressing orders obtained for sacral decub.

## 2016-12-28 NOTE — ED Triage Notes (Signed)
Patient brought in from home by EMS for altered mental status.  Son reported to EMS that patient was normal when going to bed last night and when he went in to care for his mother she was nonverbal and not breathing normal. Patient is only responsive to pain and does not follow any commands.  Patient with 10cmx10cm pressure ulcer to sacrum.  Chronic foley in place draining prulet cloudy yellow urine. VSS.

## 2016-12-28 NOTE — Care Management (Signed)
RNCM has notified [facility] of patient's return to [facility].

## 2016-12-28 NOTE — Progress Notes (Signed)
Advanced Care Plan.  Purpose of Encounter: Palliative care Parties in Attendance:  The patient, her son and me. Patient's Decisional Capacity: No. Medical Story: Jasmine Briggs  is a 83 y.o. female with a known history of Hypertension, diabetes, gout, sacral DU infection with osteomyelitis and dementia. The patient was recently admitted here for sacral DU infection with osteomyelitis, has been on vancomycin and cefepime per Dr. Sampson Goon. She was found more confused and sent from home to ED. She was found UTI with leukocytosis, treated with antibiotics in the ED. CAT scan of abdomen shows small bowel obstruction as well as stool retention. I discussed with the patient's son about her condition and poor prognosis. I discussed CODE STATUS and palliative care. Her son refused palliative care or hospice care. He wants patient to be home upon discharge. but agrees with palliative care consult.  Goals of Care Determinations: Palliative care. Plan:  Code Status: DO NOT RESUSCITATE.  Time spent discussing advance care planning: 18 minutes.

## 2016-12-28 NOTE — ED Notes (Signed)
Patient transported to CT 

## 2016-12-28 NOTE — Consult Note (Signed)
SURGICAL CONSULTATION NOTE (initial) - cpt: 99255  HISTORY OF PRESENT ILLNESS (HPI):  83 y.o. female with dementia and chronic sacral osteomyelitis secondary to pressure wound on IV antibiotics via PICC presented to [facility] ED today for evaluation of altered mental status, UTI, and constipation x 3 days per patient's son with whom patient lives. Her son reports patient has continued to pass flatus and passed a large BM yesterday following several cups of prune juice for constipation. When patient was less responsive yesterday and minimally-/non-responsive this morning, EMS was called. Patient has not vomited, but is unable to answer whether she is experiencing any abdominal pain, nausea, fever/chills, CP, or SOB. Patient's family denies patient has had any prior abdominal surgery.  Surgery is consulted by medical physician Dr. Bridgett Larsson in this context for evaluation and management of SBO.  PAST MEDICAL HISTORY (PMH):  Past Medical History:  Diagnosis Date  . Arthritis   . Dementia   . Diabetes mellitus without complication ([place])   . Gout   . Hypertension   . Osteomyelitis ([place])    sacral wound     PAST SURGICAL HISTORY ([place]):  Past Surgical History:  Procedure Laterality Date  . CARDIAC SURGERY    . IR FLUORO GUIDE CV LINE LEFT  11/19/2016  . PACEMAKER INSERTION    . PICC line placement       MEDICATIONS:  Prior to Admission medications   Medication Sig Start Date End Date Taking? Authorizing Provider  ipratropium-albuterol (DUONEB) 0.5-2.5 (3) MG/3ML SOLN Take 3 mLs by nebulization every 4 (four) hours as needed. 11/21/16  Yes Merlyn Lot, MD  rifampin (RIFADIN) 300 MG capsule Take 1 capsule (300 mg total) by mouth every 12 (twelve) hours. X 6 weeks 11/19/16  Yes Gladstone Lighter, MD  vancomycin IVPB Inject 750 mg into the vein daily. Indication:  Coag neg staph bacteremia Last Day of Therapy:  12/28/16 Labs - Sunday/Monday:  CBC/D, BMP, and vancomycin trough. Labs - Thursday:  BMP  and vancomycin trough Labs - Every other week:  ESR and CRP 11/20/16 12/28/16 Yes Mody, Sital, MD  acetaminophen (TYLENOL) 500 MG tablet Take 500 mg by mouth every 6 (six) hours as needed for mild pain.     [provider]  nicotine (NICODERM CQ - DOSED IN MG/24 HR) 7 mg/24hr patch Place 1 patch (7 mg total) onto the skin daily. Patient not taking: Reported on 11/12/2016 07/26/15   Theodoro Grist, MD  ondansetron (ZOFRAN) 4 MG tablet Take 1 tablet (4 mg total) by mouth every 6 (six) hours as needed for nausea. Patient not taking: Reported on 11/12/2016 07/26/15   Theodoro Grist, MD  potassium chloride 20 MEQ/15ML (10%) SOLN Take 15 mLs (20 mEq total) by mouth daily. Patient not taking: Reported on 11/12/2016 07/26/15   Theodoro Grist, MD     ALLERGIES:  No Known Allergies   SOCIAL HISTORY:  Social History   Social History  . Marital status: Widowed    Spouse name: N/A  . Number of children: N/A  . Years of education: N/A   Occupational History  . Not on file.   Social History Main Topics  . Smoking status: Never Smoker  . Smokeless tobacco: Former Systems developer    Types: Snuff  . Alcohol use No  . Drug use: No  . Sexual activity: Not on file   Other Topics Concern  . Not on file   Social History Narrative   From home.     The patient currently resides (  home / rehab facility / nursing home): Home with her son The patient normally is (ambulatory / bedbound): Limited mobility   FAMILY HISTORY:  Family History  Problem Relation Age of Onset  . Diabetes Maternal Aunt      REVIEW OF SYSTEMS: Unable to obtain beyond HPI secondary to altered mental status   VITAL SIGNS:  Temp:  [96.5 F (35.8 C)-97.9 F (36.6 C)] 97.7 F (36.5 C) (10/08 1527) Pulse Rate:  [70-72] 72 (10/08 1527) Resp:  [15-17] 16 (10/08 1417) BP: (117-156)/(61-93) 151/91 (10/08 1527) SpO2:  [94 %-100 %] 99 % (10/08 1527) Weight:  [110 lb (49.9 kg)] 110 lb (49.9 kg) (10/08 0822)     Height: _0  (162.6 cm)  Weight: 110 lb (49.9 kg) BMI (Calculated): 18.87   INTAKE/OUTPUT:  This shift: No intake/output data recorded.  Last 2 shifts: _1 @   PHYSICAL EXAM:  Constitutional:  -- Frail body habitus  -- Non-responsive to verbal stimuli  Eyes:  -- Pupils equally round and reactive to light  -- No scleral icterus  Ear, nose, and throat:  -- No jugular venous distension  Pulmonary:  -- No crackles  -- Equal breath sounds bilaterally -- Breathing non-labored at rest Cardiovascular:  -- S1, S2 present  -- No pericardial rubs Gastrointestinal:  -- Abdomen soft, moderate distention, palpable stool-filled colon, no evidence of tenderness to palpation, no guarding/rebound tenderness -- No abdominal masses appreciated, pulsatile or otherwise  Musculoskeletal and Integumentary:  -- Wounds or skin discoloration: None appreciated except chronic sacral pressure wound -- Extremities: B/L UE and LE FROM, hands and feet warm, no edema  Neurologic:  -- Motor function: unable to assess due to altered mental status -- Sensation: unable to assess due to altered mental status  Labs:  CBC Latest Ref Rng & Units 12/28/2016 11/23/2016 11/21/2016  WBC 3.6 - 11.0 K/uL 16.8(H) 14.3(H) 14.4(H)  Hemoglobin 12.0 - 16.0 g/dL 9.3(L) 8.3(L) 9.4(L)  Hematocrit 35.0 - 47.0 % 27.7(L) 25.6(L) 28.2(L)  Platelets 150 - 440 K/uL 224 214 225   CMP Latest Ref Rng & Units 12/28/2016 11/23/2016 11/21/2016  Glucose 65 - 99 mg/dL 181(H) 145(H) 107(H)  BUN 6 - 20 mg/dL _2 Creatinine 0.44 - 1.00 mg/dL 0.76 0.54 0.54  Sodium 135 - 145 mmol/L 142 144 140  Potassium 3.5 - 5.1 mmol/L 2.2(LL) 3.6 3.8  Chloride 101 - 111 mmol/L 108 119(H) 117(H)  CO2 22 - 32 mmol/L 26 20(L) 18(L)  Calcium 8.9 - 10.3 mg/dL 8.7(L) 8.7(L) 8.6(L)  Total Protein 6.5 - 8.1 g/dL 6.1(L) 5.4(L) 6.3(L)  Total Bilirubin 0.3 - 1.2 mg/dL 0.8 0.5 0.8  Alkaline Phos 38 - 126 U/L 113 62 64  AST 15 - 41 U/L _3 ALT 14 - 54 U/L 12(L) 11(L) 12(L)     Imaging studies:  CT Abdomen and Pelvis with Contrast (12/28/2016) - personally reviewed with patient's son (bedside) 1. Moderate diffuse small bowel dilatation with transition to decompressed distal ileum, concerning for obstruction. 2. Moderate colonic stool burden. Large amount of rectal stool concerning for impaction. 3. Sacral decubitus ulcer.  No abscess. 4. Partially visualized bilateral pleural effusions and right greater than left lower lung atelectasis/consolidation. 5. Anasarca. 6. Bilateral nonobstructing nephrolithiasis. 7.  Aortic Atherosclerosis  Assessment/Plan: (ICD-10's: K11.41) 83 y.o. female with fecal impaction and constipation with probable ileus vs small bowel obstruction of unclear etiology seeming less likely, complicated by altered mental status, UTI, sacral osteomyelitis on IV antibiotics via PICC, chronic constipation,  advanced age, dementia, chronic malnutrition, B/L pleural effusions, and by pertinent comorbidities including DM, HTN, cardiac arrhythmia s/p pacemaker insertion, B/L nephrolithiasis, and osteoarthritis.   - NPO, IVF  - enemas per prn, mechanical disimpaction likely required  - family expresses wishes against surgery even if becomes indicated   - no indication for surgical intervention at this time and patient unlikely to survive abdominal surgery  - patient's family also expresses they wish to not have NG tube placed until they can discuss and decide goals of care  - medical management of patient's rather extensive comorbidities as per primary medical team  - DVT prophylaxis, frequent repositioning and pressure offloading  All of the above findings and recommendations were discussed with the patient and her son, and all of patient's son's questions were answered to his expressed satisfaction.  Thank you for the opportunity to participate in this patient's care.   -- Marilynne Drivers Rosana Hoes, MD, [place]: [facility] - Partnering for exceptional care. Office: [phone]

## 2016-12-28 NOTE — Progress Notes (Signed)
Dr. Gwinda Passe notified of no urine output since placement of new catheter.  Dr. Gwinda Passe preformed bedside ultrasound to assess placement.  Urethral catheter in place.  Minimal urine noted in bladder.  MD concerned about dilated bowels.  MD verbalized to family that he would order CT of abdomen.

## 2016-12-29 DIAGNOSIS — G3183 Dementia with Lewy bodies: Secondary | ICD-10-CM

## 2016-12-29 DIAGNOSIS — N390 Urinary tract infection, site not specified: Principal | ICD-10-CM

## 2016-12-29 DIAGNOSIS — R4182 Altered mental status, unspecified: Secondary | ICD-10-CM

## 2016-12-29 DIAGNOSIS — Z515 Encounter for palliative care: Secondary | ICD-10-CM

## 2016-12-29 DIAGNOSIS — Z7189 Other specified counseling: Secondary | ICD-10-CM

## 2016-12-29 DIAGNOSIS — K56609 Unspecified intestinal obstruction, unspecified as to partial versus complete obstruction: Secondary | ICD-10-CM

## 2016-12-29 DIAGNOSIS — F0280 Dementia in other diseases classified elsewhere without behavioral disturbance: Secondary | ICD-10-CM

## 2016-12-29 LAB — BASIC METABOLIC PANEL
ANION GAP: 3 — AB (ref 5–15)
BUN: 13 mg/dL (ref 6–20)
CALCIUM: 7.9 mg/dL — AB (ref 8.9–10.3)
CO2: 24 mmol/L (ref 22–32)
Chloride: 116 mmol/L — ABNORMAL HIGH (ref 101–111)
Creatinine, Ser: 0.64 mg/dL (ref 0.44–1.00)
GFR calc Af Amer: >60 mL/min (ref >60)
GLUCOSE: 81 mg/dL (ref 65–99)
POTASSIUM: 3.8 mmol/L (ref 3.5–5.1)
SODIUM: 143 mmol/L (ref 135–145)

## 2016-12-29 LAB — CBC
HEMATOCRIT: 23.4 % — AB (ref 35.0–47.0)
HEMOGLOBIN: 7.7 g/dL — AB (ref 12.0–16.0)
MCH: 28.2 pg (ref 26.0–34.0)
MCHC: 33.1 g/dL (ref 32.0–36.0)
MCV: 85.3 fL (ref 80.0–100.0)
Platelets: 175 10*3/uL (ref 150–440)
RBC: 2.74 MIL/uL — ABNORMAL LOW (ref 3.80–5.20)
RDW: 19 % — AB (ref 11.5–14.5)
WBC: 19.4 10*3/uL — AB (ref 3.6–11.0)

## 2016-12-29 LAB — GLUCOSE, CAPILLARY
GLUCOSE-CAPILLARY: 76 mg/dL (ref 65–99)
GLUCOSE-CAPILLARY: 77 mg/dL (ref 65–99)
GLUCOSE-CAPILLARY: 81 mg/dL (ref 65–99)
GLUCOSE-CAPILLARY: 106 mg/dL — AB (ref 65–99)
Glucose-Capillary: 69 mg/dL (ref 65–99)

## 2016-12-29 MED ORDER — DEXTROSE 50 % IV SOLN
INTRAVENOUS | Status: AC
  Filled 2016-12-29: qty 50

## 2016-12-29 MED ORDER — DEXTROSE 50 % IV SOLN
25.0000 mL | Freq: Once | INTRAVENOUS | Status: AC
  Administered 2016-12-29: 22:00:00 25 mL via INTRAVENOUS

## 2016-12-29 MED ORDER — SORBITOL 70 % SOLN
960.0000 mL | TOPICAL_OIL | Freq: Once | ORAL | Status: AC
  Administered 2016-12-30: 960 mL via RECTAL
  Filled 2016-12-29: qty 473

## 2016-12-29 NOTE — Consult Note (Signed)
Consultation Note Date: 12/29/16  Patient Name: Jasmine Briggs  DOB: 10-24-1920  MRN: [id number]  Age / Sex: 83 y.o., female  PCP: Caryl Never, MD Referring Physician: Dustin Flock, MD  Reason for Consultation: Establishing goals of care  HPI/Patient Profile: 83 y.o. female  with past medical history of dementia, arthritis, gout, diabetes mellitus, hypertension, sacral decubitus wounds, and osteomyelitis admitted on 12/28/2016 with altered mental status. Recent admission for sacral decubitus wounds with osteomyelitis-followed by Dr. Ola Spurr and started on vancomycin and cefepime via PICC. In ED, found to have UTI and leukocytosis. CT scan revealed small bowel obstruction and stool retention. Surgery consulted. She is not a surgical candidate and family expressed wishes against surgery. Considering NG tube placement. Patient to receive enemas prn and mechanical disimpaction if necessary. Palliative medicine consultation for goals of care.    Clinical Assessment and Goals of Care: I have reviewed medical records, discussed with care team, and met with two sons [name] and [name]) at bedside to discuss diagnosis, prognosis, GOC, EOL wishes, disposition and options. Patient wakes to voice but will not answer questions/follow commands for me. She will respond yes/no when family members talk to her. Patient known to PMT from previous admissions--notes reviewed.   Introduced Palliative Medicine as specialized medical care for people living with serious illness. It focuses on providing relief from the symptoms and stress of a serious illness. The goal is to improve quality of life for both the patient and the family.  We discussed a brief life review of the patient. Lives with son [name] but she has 6 children and multiple grandchildren that are involved in her care. Someone is with her at all times. Baseline,  she is dependent of ADL's and bed-bound. [name] has been giving her antibiotics via PICC daily and also performing dressing changes daily. Two days before hospitalization, the patient was conversing normally (per family will only speak with people she knows) and with fair appetite. She typically has multiple stools a day.   [name] does NOT have a documented living will or HCPOA but children make joint decisions regarding her care. Confirmed DNR code status.   Discussed hospital diagnoses, interventions, and underlying co-morbidities. Discussed disease trajectory of dementia and concern with continued hospitalization secondary to progressive dementia, wounds, and dementia. The sons confirm that they would NOT pursue surgical intervention for SBO. They want to be the least invasive as possible. They have not yet made a decision about placing NG tube. Discussed risks/benefits. Waiting on input from surgeon today before making decision.   [name] has been dedicated to cleaning and changing her sacral wound daily. He tells me he is "frustrated" that it hasn't healed fast enough. Explained challenges with wound healing with her age, dementia, being bedbound, and also nutritional status.   Daughter, [name], comes into the room during the conversation. The family tells me she is "much more alert" today and remain hopeful for continued improvement. They again tell me that it is normal for her not to communicate with individuals  she does not know (she has never liked talking to doctors).   Family share stories of [name] being a "strong woman" throughout her life. 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   [name] speaks highly of the family that provides much more support than home health agencies. It is very important to keep [name] home.   Offered continued support from palliative services during hospitalization. [name]  agree.    SUMMARY OF RECOMMENDATIONS    DNR. Continue all other interventions.   Family undecided on NG tube at this time.   Family feels her mental status has improved today. Hopeful for continued improvement.   Home on discharge. Sons have previously refused hospice services. Agreeable with palliative services.   PMT will continue to support patient/family through hospitalization and discuss hospice.   Code Status/Advance Care Planning:  DNR  Symptom Management:   Per attending  Palliative Prophylaxis:   Aspiration, Delirium Protocol, Frequent Pain Assessment, Oral Care, Palliative Wound Care and Turn Reposition  Additional Recommendations (Limitations, Scope, Preferences):  DNR. Otherwise continue medical management  Psycho-social/Spiritual:   Desire for further Chaplaincy support: yes  Additional Recommendations: Caregiving  Support/Resources and Education on Hospice  Prognosis:   Unable to determine: poor prognosis secondary to progressive dementia, sacral wounds with osteomyelitis, small bowel obstruction, and poor functional/cognitive/nutritional status.   Discharge Planning: To Be Determined      Primary Diagnoses: Present on Admission: . UTI (urinary tract infection)   I have reviewed the medical record, interviewed the patient and family, and examined the patient. The following aspects are pertinent.  Past Medical History:  Diagnosis Date  . Arthritis   . Dementia   . Diabetes mellitus without complication ([place])   . Gout   . Hypertension   . Osteomyelitis ([place])    sacral wound   Social History   Social History  . Marital status: Widowed    Spouse name: N/A  . Number of children: N/A  . Years of education: N/A   Social History Main Topics  . Smoking status: Never Smoker  . Smokeless tobacco: Former Systems developer    Types: Snuff  . Alcohol use No  . Drug use: No  . Sexual activity: Not Asked   Other Topics Concern  . None   Social  History Narrative   From home.    Family History  Problem Relation Age of Onset  . Diabetes Maternal Aunt    Scheduled Meds: . enoxaparin (LOVENOX) injection  40 mg Subcutaneous Q24H  . Influenza vac split quadrivalent PF  0.5 mL Intramuscular Tomorrow-1000  . insulin aspart  0-5 Units Subcutaneous QHS  . insulin aspart  0-9 Units Subcutaneous TID WC  . rifampin  300 mg Oral Q12H   Continuous Infusions: . 0.9 % NaCl with KCl 40 mEq / L 75 mL/hr (12/29/16 0422)  . cefTRIAXone (ROCEPHIN)  IV    . vancomycin Stopped (12/29/16 1009)   PRN Meds:.acetaminophen **OR** acetaminophen, albuterol, bisacodyl, hydrALAZINE, ondansetron **OR** ondansetron (ZOFRAN) IV, senna-docusate Medications Prior to Admission:  Prior to Admission medications   Medication Sig Start Date End Date Taking? Authorizing Provider  ipratropium-albuterol (DUONEB) 0.5-2.5 (3) MG/3ML SOLN Take 3 mLs by nebulization every 4 (four) hours as needed. 11/21/16  Yes Merlyn Lot, MD  rifampin (RIFADIN) 300 MG capsule Take 1 capsule (300 mg total) by mouth every 12 (twelve) hours. X 6 weeks 11/19/16  Yes Gladstone Lighter, MD  acetaminophen (TYLENOL) 500 MG tablet Take 500 mg by mouth every 6 (six) hours as needed for mild pain.     [provider]  nicotine (NICODERM CQ - DOSED IN MG/24 HR) 7 mg/24hr patch Place 1 patch (7 mg total) onto the skin daily. Patient not taking: Reported on 11/12/2016 07/26/15   Theodoro Grist, MD  ondansetron (ZOFRAN) 4 MG tablet Take 1 tablet (4 mg total) by mouth every 6 (six) hours as needed for nausea. Patient not taking: Reported on 11/12/2016 07/26/15   Theodoro Grist, MD  potassium chloride 20 MEQ/15ML (10%) SOLN Take 15 mLs (20 mEq total) by mouth daily. Patient not taking: Reported on 11/12/2016 07/26/15   Theodoro Grist, MD   No Known Allergies Review of Systems  Unable to perform ROS: Dementia   Physical Exam  Constitutional: She is easily aroused. She appears ill.  HENT:  Head:  Normocephalic and atraumatic.  Cardiovascular: Regular rhythm.   Pulmonary/Chest: No accessory muscle usage. No tachypnea. No respiratory distress.  Abdominal: There is no tenderness.  Neurological: She is alert and easily aroused. She is disoriented.  Opens eyes, will not answer questions  Skin: Skin is warm and dry.  Psychiatric: She is inattentive.  Nursing note and vitals reviewed.  Vital Signs: BP 131/80 (BP Location: Right Arm)   Pulse 72   Temp (!) 97.5 F (36.4 C) (Oral)   Resp 15   Ht _0  (1.626 m)   Wt 49.9 kg (110 lb)   SpO2 100%   BMI 18.88 kg/m  Pain Assessment: PAINAD     SpO2: SpO2: 100 % O2 Device:SpO2: 100 % O2 Flow Rate: .   IO: Intake/output summary:   Intake/Output Summary (Last 24 hours) at 12/29/16 1148 Last data filed at 12/29/16 0300  Gross per 24 hour  Intake           1657.5 ml  Output                0 ml  Net           1657.5 ml    LBM: Last BM Date: 12/28/16 Baseline Weight: Weight: 49.9 kg (110 lb) Most recent weight: Weight: 49.9 kg (110 lb)     Palliative Assessment/Data: PPS 30%   Flowsheet Rows     Most Recent Value  Intake Tab  Referral Department  Hospitalist  Unit at Time of Referral  Med/Surg Unit  Palliative Care Primary Diagnosis  Sepsis/Infectious Disease  Palliative Care Type  Return patient Palliative Care  Reason for referral  Clarify Goals of Care  Date first seen by Palliative Care  12/29/16  Clinical Assessment  Palliative Performance Scale Score  30%  Psychosocial & Spiritual Assessment  Palliative Care Outcomes  Patient/Family meeting held?  Yes  Who was at the meeting?  patient, sons [name] and [name])  Palliative Care Outcomes  Clarified goals of care, Provided end of life care assistance, Provided psychosocial or spiritual support, Linked to palliative care logitudinal support, ACP counseling assistance      Time In: 6546 Time Out: 1155 Time Total: 19mn Greater than 50%  of this time was spent  counseling and coordinating care related to the above assessment and plan.  Signed by:  MIhor Dow FNP-C Palliative Medicine Team  Phone: [phone]: [phone]  Please contact Palliative Medicine Team phone at [phone] questions and concerns.  For individual provider: See AShea Evans

## 2016-12-29 NOTE — Progress Notes (Signed)
SURGICAL PROGRESS NOTE (cpt [phone])  Hospital Day(s): 1.    Post op day(s):  Marland Kitchen   Interval History: Patient seen and examined, no acute events or new complaints overnight. Patient remains non-verbal and unable to provide any history, though per patient's RN, enema was not administered after she passed a moderately large BM today.  Review of Systems: Unable to obtain due to patient's non-verbal status  Vital signs in last 24 hours: [min-max] current  Temp:  [97.5 F (36.4 C)-97.9 F (36.6 C)] 97.5 F (36.4 C) (10/09 0348) Pulse Rate:  [70-75] 72 (10/09 0348) Resp:  [15-17] 15 (10/09 0348) BP: (117-156)/(61-91) 131/80 (10/09 0348) SpO2:  [97 %-100 %] 100 % (10/09 0348) Weight:  [110 lb (49.9 kg)] 110 lb (49.9 kg) (10/08 0822)     Height:  (162.6 cm) Weight: 110 lb (49.9 kg) BMI (Calculated): 18.87   Intake/Output this shift:  No intake/output data recorded.   Intake/Output last 2 shifts:  @   Physical Exam:  Constitutional: frail, minimally responsive, in no apparent distress  HENT: normocephalic without obvious abnormality  Eyes: PERRL, EOM's grossly intact and symmetric  Neuro: CN II - XII grossly intact and symmetric without deficit  Respiratory: breathing non-labored at rest  Cardiovascular: regular rate and sinus rhythm  Gastrointestinal: soft and not appreciably tender with easily palpable stool-filled colon Musculoskeletal: UE and LE FROM though with limited movement observed   Labs:  CBC Latest Ref Rng & Units 12/29/2016 12/28/2016 11/23/2016  WBC 3.6 - 11.0 K/uL 19.4(H) 16.8(H) 14.3(H)  Hemoglobin 12.0 - 16.0 g/dL 7.7(L) 9.3(L) 8.3(L)  Hematocrit 35.0 - 47.0 % 23.4(L) 27.7(L) 25.6(L)  Platelets 150 - 440 K/uL 175 224 214   CMP Latest Ref Rng & Units 12/29/2016 12/28/2016 11/23/2016  Glucose 65 - 99 mg/dL 81 119(J) 478(G)  BUN 6 - 20 mg/dL Creatinine 0.44 - 1.00 mg/dL 9.56 2.13 0.86  Sodium 135 - 145 mmol/L 143 142 144  Potassium 3.5 - 5.1  mmol/L 3.8 2.2(LL) 3.6  Chloride 101 - 111 mmol/L 116(H) 108 119(H)  CO2 22 - 32 mmol/L 24 26 20(L)  Calcium 8.9 - 10.3 mg/dL 7.9(L) 8.7(L) 8.7(L)  Total Protein 6.5 - 8.1 g/dL - 6.1(L) 5.4(L)  Total Bilirubin 0.3 - 1.2 mg/dL - 0.8 0.5  Alkaline Phos 38 - 126 U/L - 113 62  AST 15 - 41 U/L - 22 20  ALT 14 - 54 U/L - 12(L) 11(L)   Imaging studies: No new pertinent imaging studies   Assessment/Plan: (ICD-10's: K66.41) 83 y.o. female with fecal impaction and constipation, worsening leukocytosis with probable ileus vs small bowel obstruction of unclear etiology seeming less likely, complicated by altered mental status, UTI, sacral osteomyelitis on IV antibiotics via PICC, chronic constipation, advanced age, dementia, chronic malnutrition, B/L pleural effusions, and by pertinent comorbidities including DM, HTN, cardiac arrhythmia s/p pacemaker insertion, B/L nephrolithiasis, and osteoarthritis.              - continue enemas prn             - NPO, IVF while altered mental status and impacted             - family expresses wishes against surgery even if becomes indicated              - no indication for surgical intervention at this time and patient unlikely to survive abdominal surgery             -  patient's family also expresses they wish to not have NG tube placed until they can discuss and decide goals of care             - medical management of patient's rather extensive comorbidities as per primary medical team             - DVT prophylaxis, frequent repositioning and pressure offloading  All of the above findings and recommendations were discussed with the patient and her two sons, and all of patient's sons' questions were answered to their expressed satisfaction.  Thank you for the opportunity to participate in this patient's care.   -- Scherrie Gerlach Earlene Plater, MD, RPVI [place]: [facility] General Surgery - Partnering for exceptional care. Office: [phone]

## 2016-12-29 NOTE — Progress Notes (Signed)
Pt remains lethargic today, responds to verbal stimulation. IVF infusing as ordered. IV Vanc & Rocephin received today. Family met with palliative care today- no decision on NG tube/peg tube if needed. Large soft BM today. Dr. Rosana Hoes with surgery gave verbal order for enema. Sacral wound care dressing changed.

## 2016-12-29 NOTE — Progress Notes (Signed)
Initial Nutrition Assessment  DOCUMENTATION CODES:   Severe malnutrition in context of chronic illness  INTERVENTION:  As patient meets criteria for severe chronic malnutrition, consider initiation of TPN if diet expected to not be advanced for 5-7 days. Her severe chronic malnutrition is compounded by her increased needs for calories and protein in setting of her large stage IV sacral wound. Noted there is a consult in to discuss goals of care. Would only recommend TPN if it is expected to be a bridge to when ileus/SBO resolves and diet can be advanced. Also, her current PICC is only a single-lumen, so patient would have to undergo placement of another central line as TPN can only be infused through dedicated line.  Due to severity of wound and malnutrition, recommend providing multivitamin with minerals daily, zinc 220 mg daily (provides approximately 50 mg elemental zinc), vitamin C 500 mg BID, and copper 2 mg daily once able to provide enterally.  Will continue to monitor outcome of discussions regarding goals of care.  NUTRITION DIAGNOSIS:   Malnutrition (Severe) related to chronic illness (dementia, advanced age, large stage IV sacral wound with bony prominence (8x10cm)) as evidenced by severe depletion of body fat, severe depletion of muscle mass, 35.3% weight loss over 1.5 years.  GOAL:   Patient will meet greater than or equal to 90% of their needs  MONITOR:   Diet advancement, Labs, Weight trends, Skin, I & O's  REASON FOR ASSESSMENT:   Low [place]aden    ASSESSMENT:   83 year old female with PMHx of dementia, osteomyelitis from stage IV sacral wound on vancomycin PTA via single-lumen PICC, HTN, arthritis, DM type 2, cardiac arrhythmia s/p pacemaker insertion, gout who presented with two day history of AMS and decreased responsiveness found to have probable ileus vs small bowel obstruction, UTI, fecal impaction.   -Surgery following patient. Family would not want an operation.  Family is still deciding on if they want an NG tube placed to suction. -Pending PMT consult to discuss goals of care.  Met with patient and family at bedside. Patient briefly opened her eyes during assessment, but was unable to provide any history. Family reports she has continued on a pureed diet (dysphagia 1) with thickened liquids (on last admission was on honey-thick liquids). She is given 3 meals per day, but the amount she eats varies on how she feels that day. Unable to provide any further details on intake. They report patient drinking Ensure, but unsure which type and how much she has daily. They report her last meal was on 10/7. She has been NPO since. Patient became constipated PTA, but was able to still have some bowel movements - family reports she had a bowel movement today. She also developed some abdominal pain PTA, and believe she may have started reporting that about one week ago. Family denies any vomiting.  Family reports patient has lost a significant amount of weight. Per review of chart she was 164.7 lbs on 06/12/2015. There is a gap in weights of >1 year. However, on 11/20/2016 patient was only 106.5 lbs. Patient has lost 58.2 lbs (35.3% body weight) over the span of about 1.5 years.  Medications reviewed and include: Novolog 0-9 units TID, Novolog 0-5 units QHS, NS with KCl 40 mEq/L @ 75 ml/hr, ceftriaxone.  Labs reviewed: CBG 76-150, Chloride 116, Anion gap 3.  Nutrition-Focused physical exam completed. Findings are severe fat depletion, severe muscle depletion, and mild pitting edema to bilateral upper extremities.   Discussed with  RN. Family not wanting hospice care at this time.  Diet Order:  Diet NPO time specified Except for: Sips with Meds  Skin:  Wound (see comment) (Stg IV to sacrum & extends to right buttocks (8 cm x 10 cm x 0.3 cm) w/ bony prominence; skin tear right breast and arm)  Last BM:  12/29/2016 per family report; per chart patient is  incontinent  Height:   Ht Readings from Last 1 Encounters:  12/28/16 5' 4"  (1.626 m)    Weight:   Wt Readings from Last 1 Encounters:  12/28/16 110 lb (49.9 kg)    Ideal Body Weight:  54.5 kg  BMI:  Body mass index is 18.88 kg/m.  Estimated Nutritional Needs:   Kcal:  1500-1750 (30-35 kcal/kg)  Protein:  75-90 grams (1.5-1.8 grams/kg)  Fluid:  1.25 L/day (25 ml/kg)  EDUCATION NEEDS:   Education needs no appropriate at this time  Willey Blade, [place], [place], [place] Office: [phone] Pager: [phone] After Hours/Weekend Pager: [phone]

## 2016-12-29 NOTE — Progress Notes (Signed)
[employer] - [place] [facility]                                                                                                                                                                                  Patient Demographics   Jasmine Briggs, is a 83 y.o. female, DOB - [birth year], ZOX:[id number]  Admit date - 12/28/2016   Admitting Physician Shaune Pollack, MD  Outpatient Primary MD for the patient is Gladman, Cheral Marker, MD   LOS - 1  Subjective: Patient admitted with altered mental status Currently eyes open     Review of Systems:   CONSTITUTIONAL: Unable to provide due to mental status  Vitals:   Vitals:   12/28/16 1417 12/28/16 1527 12/28/16 2011 12/29/16 0348  BP: 135/61 (!) 151/91 135/66 131/80  Pulse: 70 72 75 72  Resp: Temp:  97.7 F (36.5 C) (!) 97.5 F (36.4 C) (!) 97.5 F (36.4 C)  TempSrc:  Oral Oral Oral  SpO2: 100% 99% 100% 100%  Weight:      Height:        Wt Readings from Last 3 Encounters:  12/28/16 110 lb (49.9 kg)  11/21/16 106 lb (48.1 kg)  11/20/16 106 lb 8 oz (48.3 kg)     Intake/Output Summary (Last 24 hours) at 12/29/16 1349 Last data filed at 12/29/16 0300  Gross per 24 hour  Intake           1607.5 ml  Output                0 ml  Net           1607.5 ml    Physical Exam:   GENERAL:Chronically ill-appearing HEAD, EYES, EARS, NOSE AND THROAT: Atraumatic, normocephalic.  Pupils equal and reactive to light. Sclerae anicteric. No conjunctival injection. No oro-pharyngeal erythema.  NECK: Supple. There is no jugular venous distention. No bruits, no lymphadenopathy, no thyromegaly.  HEART: Regular rate and rhythm,. No murmurs, no rubs, no clicks.  LUNGS: Clear to auscultation bilaterally. No rales or rhonchi. No wheezes.  ABDOMEN: Soft, flat,Positively distended , decreased bowel sounds EXTREMITIES: Swelling of the right upper extremity  NEUROLOGIC: Eyes open but unable to communicate SKIN: Moist and warm  with no rashes appreciated.  Psych: Not anxious, depressed LN: No inguinal LN enlargement    Antibiotics   Anti-infectives    Start     Dose/Rate Route Frequency Ordered Stop   12/29/16 1600  cefTRIAXone (ROCEPHIN) 1 g in dextrose 5 % 50 mL IVPB     1 g 100 mL/hr over 30 Minutes Intravenous Every 24 hours 12/28/16 1602  12/29/16 1000  vancomycin (VANCOCIN) 1,250 mg in sodium chloride 0.9 % 250 mL IVPB  Status:  Discontinued     1,250 mg 166.7 mL/hr over 90 Minutes Intravenous Every 48 hours 12/28/16 1602 12/29/16 1306   12/28/16 1600  rifampin (RIFADIN) capsule 300 mg    Comments:  X 6 weeks     300 mg Oral Every 12 hours 12/28/16 1512     12/28/16 1545  vancomycin (VANCOCIN) IVPB 1000 mg/200 mL premix     1,000 mg 200 mL/hr over 60 Minutes Intravenous  Once 12/28/16 1544 12/28/16 1731   12/28/16 1230  cefTRIAXone (ROCEPHIN) 1 g in dextrose 5 % 50 mL IVPB - Premix     1 g 100 mL/hr over 30 Minutes Intravenous  Once 12/28/16 1229 12/28/16 1313      Medications   Scheduled Meds: . enoxaparin (LOVENOX) injection  40 mg Subcutaneous Q24H  . Influenza vac split quadrivalent PF  0.5 mL Intramuscular Tomorrow-1000  . insulin aspart  0-5 Units Subcutaneous QHS  . insulin aspart  0-9 Units Subcutaneous TID WC  . rifampin  300 mg Oral Q12H   Continuous Infusions: . 0.9 % NaCl with KCl 40 mEq / L 75 mL/hr (12/29/16 0422)  . cefTRIAXone (ROCEPHIN)  IV     PRN Meds:.acetaminophen **OR** acetaminophen, albuterol, bisacodyl, hydrALAZINE, ondansetron **OR** ondansetron (ZOFRAN) IV, senna-docusate   Data Review:   Micro Results Recent Results (from the past 240 hour(s))  Blood culture (routine x 2)     Status: None (Preliminary result)   Collection Time: 12/28/16  7:48 AM  Result Value Ref Range Status   Specimen Description BLOOD RIGHT FA  Final   Special Requests   Final    BOTTLES DRAWN AEROBIC AND ANAEROBIC Blood Culture adequate volume   Culture NO GROWTH 1 DAY  Final    Report Status PENDING  Incomplete  Urine Culture     Status: Abnormal (Preliminary result)   Collection Time: 12/28/16  7:48 AM  Result Value Ref Range Status   Specimen Description URINE, CLEAN CATCH  Final   Special Requests NONE  Final   Culture >=100,000 COLONIES/mL GRAM NEGATIVE RODS (A)  Final   Report Status PENDING  Incomplete  Blood culture (routine x 2)     Status: None (Preliminary result)   Collection Time: 12/28/16 10:51 AM  Result Value Ref Range Status   Specimen Description BLOOD RIGHT HAND  Final   Special Requests   Final    BOTTLES DRAWN AEROBIC AND ANAEROBIC Blood Culture adequate volume   Culture NO GROWTH 1 DAY  Final   Report Status PENDING  Incomplete    Radiology Reports Ct Head Wo Contrast  Result Date: 12/28/2016 CLINICAL DATA:  Encephalopathy EXAM: CT HEAD WITHOUT CONTRAST TECHNIQUE: Contiguous axial images were obtained from the base of the skull through the vertex without intravenous contrast. COMPARISON:  06/12/2015 FINDINGS: Brain: There is atrophy and chronic small vessel disease changes. No acute intracranial abnormality. Specifically, no hemorrhage, hydrocephalus, mass lesion, acute infarction, or significant intracranial injury. Vascular: No hyperdense vessel or unexpected calcification. Skull: No acute calvarial abnormality. Sinuses/Orbits: Visualized paranasal sinuses and mastoids clear. Orbital soft tissues unremarkable. Other: None IMPRESSION: No acute intracranial abnormality. Atrophy, chronic microvascular disease. Electronically Signed   By: Charlett Nose M.D.   On: 12/28/2016 08:46   Ct Abdomen Pelvis W Contrast  Result Date: 12/28/2016 CLINICAL DATA:  Encephalopathy. Sacral decubitus ulcer. Chronic Foley with cloudy urine. EXAM: CT ABDOMEN AND PELVIS WITH  CONTRAST TECHNIQUE: Multidetector CT imaging of the abdomen and pelvis was performed using the standard protocol following bolus administration of intravenous contrast. CONTRAST:  75mL ISOVUE-300  IOPAMIDOL (ISOVUE-300) INJECTION 61% COMPARISON:  None. FINDINGS: Lower chest: Partially visualized bilateral pleural effusions with compressive atelectasis in the left lower lobe and atelectasis or consolidation in the right lower lobe. Cardiomegaly. Pacemaker leads. Hepatobiliary: Multiple hypodense lesions scattered in both hepatic lobes with the largest measuring 11 mm in segment IVa and 12 mm in segment VI. These likely represent cysts though are predominantly too small to fully characterize. Small calcifications in the right hepatic lobe. Grossly unremarkable gallbladder. No biliary dilatation. Pancreas: Unremarkable. Spleen: Unremarkable. Adrenals/Urinary Tract: Unremarkable adrenal glands. Small nonobstructing calculi in both kidneys 5 mm. Low density lesion in the lower pole of the right kidney, too small to fully characterize. Bladder decompressed by a Foley catheter. Stomach/Bowel: Mild proximal distension of the stomach by fluid. Small bowel loops are fluid-filled and diffusely dilated throughout the abdomen, measuring up to approximately 4 cm in diameter. There is a transition point in the central abdomen just left of midline (series 3, image 47) with the more distal ileum being completely decompressed. A moderate amount of stool is present throughout the colon, and there is a large amount of stool within the distended rectum. The appendix is unremarkable. Vascular/Lymphatic: Abdominal aortic atherosclerosis without aneurysm. No enlarged lymph nodes. Reproductive: Status post hysterectomy. No adnexal masses. Other: Small volume ascites. No loculated fluid collection. No pneumoperitoneum. Diffuse body wall edema. Musculoskeletal: Decubitus ulcer over the lower sacrum/coccyx with a small amount of subcutaneous gas to the left of the tip of the coccyx without drainable fluid collection. Osseous demineralization without gross focal osseous destructive changes. Advanced lower lumbar facet arthrosis with  grade 2 anterolisthesis of L4 on L5. Degenerative changes of the right greater than left hips. IMPRESSION: 1. Moderate diffuse small bowel dilatation with transition to decompressed distal ileum, concerning for obstruction. 2. Moderate colonic stool burden. Large amount of rectal stool concerning for impaction. 3. Sacral decubitus ulcer.  No abscess. 4. Partially visualized bilateral pleural effusions and right greater than left lower lung atelectasis/consolidation. 5. Anasarca. 6. Bilateral nonobstructing nephrolithiasis. 7.  Aortic Atherosclerosis (ICD10-I70.0). Electronically Signed   By: Sebastian Ache M.D.   On: 12/28/2016 11:44   Dg Chest Portable 1 View  Result Date: 12/28/2016 CLINICAL DATA:  Altered mental status. EXAM: PORTABLE CHEST 1 VIEW COMPARISON:  Radiograph of November 14, 2016. FINDINGS: Stable cardiomegaly. Right-sided pacemaker is unchanged. No pneumothorax is noted. Minimal left basilar subsegmental atelectasis or scarring is noted. Improved opacity is noted in the right upper and lower lobes concerning for improved edema or infiltrate. Mild right pleural effusion remains. Stable chronic dislocation of right shoulder is noted. IMPRESSION: Decreased right lung opacity is noted concerning for improved edema or infiltrate, with mild right pleural effusion present. Electronically Signed   By: Lupita Raider, M.D.   On: 12/28/2016 09:07     CBC  Recent Labs Lab 12/28/16 0748 12/29/16 0417  WBC 16.8* 19.4*  HGB 9.3* 7.7*  HCT 27.7* 23.4*  PLT 224 175  MCV 84.7 85.3  MCH 28.4 28.2  MCHC 33.5 33.1  RDW 18.4* 19.0*    Chemistries   Recent Labs Lab 12/28/16 0748 12/28/16 0755 12/29/16 0417  NA 142  --  143  K 2.2*  --  3.8  CL 108  --  116*  CO2 26  --  24  GLUCOSE 181*  --  81  BUN 11  --  13  CREATININE 0.76  --  0.64  CALCIUM 8.7*  --  7.9*  MG  --  2.2  --   AST 22  --   --   ALT 12*  --   --   ALKPHOS 113  --   --   BILITOT 0.8  --   --     ------------------------------------------------------------------------------------------------------------------ estimated creatinine clearance is 34.6 mL/min (by C-G formula based on SCr of 0.64 mg/dL). ------------------------------------------------------------------------------------------------------------------ No results for input(s): HGBA1C in the last 72 hours. ------------------------------------------------------------------------------------------------------------------ No results for input(s): CHOL, HDL, LDLCALC, TRIG, CHOLHDL, LDLDIRECT in the last 72 hours. ------------------------------------------------------------------------------------------------------------------ No results for input(s): TSH, T4TOTAL, T3FREE, THYROIDAB in the last 72 hours.  Invalid input(s): FREET3 ------------------------------------------------------------------------------------------------------------------ No results for input(s): VITAMINB12, FOLATE, FERRITIN, TIBC, IRON, RETICCTPCT in the last 72 hours.  Coagulation profile No results for input(s): INR, PROTIME in the last 168 hours.  No results for input(s): DDIMER in the last 72 hours.  Cardiac Enzymes  Recent Labs Lab 12/28/16 0748  TROPONINI 0.03*   ------------------------------------------------------------------------------------------------------------------ Invalid input(s): POCBNP    Assessment & Plan  Patient is a 83 year old with dementia  1.UTI with leukocytosis. Continue IV Rocephin  2. Acute mental encephalopathy due to above. No significant improvement  3. Small bowel obstruction. Nothing by mouth Appreciate surgical input Patient's abdomen still distended I discussed with the son regarding NG tube placement they're not decided  4.Severe hypokalemia. Replace  5.Sacral DU infection with osteomyelitis. Continue vancomycin and rifampin, ID consult. Wound care.  6. Dementia. Aspiration and fall  precaution. 7. Diabetes. Sliding scale. 8. Hypertension. IV hydralazine when necessary.  The patient has very poor prognosis. Palliative care consult. DO NOT RESUSCITATE      Code Status Orders        Start     Ordered   12/28/16 1513  Do not attempt resuscitation (DNR)  Continuous    Question Answer Comment  In the event of cardiac or respiratory ARREST Do not call a "code blue"   In the event of cardiac or respiratory ARREST Do not perform Intubation, CPR, defibrillation or ACLS   In the event of cardiac or respiratory ARREST Use medication by any route, position, wound care, and other measures to relive pain and suffering. May use oxygen, suction and manual treatment of airway obstruction as needed for comfort.      12/28/16 1512    Code Status History    Date Active Date Inactive Code Status Order ID Comments User Context   11/12/2016  1:52 PM 11/20/2016  5:05 PM DNR [id number]  Katha Hamming, MD ED   11/12/2016  1:39 PM 11/12/2016  1:52 PM Full Code [id number]  Katha Hamming, MD ED   07/10/2015 10:55 PM 07/26/2015  8:43 PM DNR [id number]  Oralia Manis, MD Inpatient   06/24/2015  5:41 PM 06/26/2015  7:08 PM DNR [id number]  Suan Halter, MD Inpatient   06/21/2015  8:04 PM 06/24/2015  5:41 PM Full Code [id number]  Houston Siren, MD Inpatient   06/12/2015  8:35 PM 06/15/2015  7:56 PM Full Code [id number]  Enid Baas, MD Inpatient    Advance Directive Documentation     Most Recent Value  Type of Advance Directive  Healthcare Power of Attorney  Pre-existing out of facility DNR order (yellow form or pink MOST form)  -  "MOST" Form in Place?  -           Consults  Surgery  DVT Prophylaxis  Lovenox   Lab Results  Component Value Date   PLT 175 12/29/2016     Time Spent in minutes   Greater than 50% of time spent in care coordination and counseling patient regarding the condition and plan of care.   Auburn Bilberry M.D on 12/29/2016 at 1:49  PM  Between 7am to 6pm - Pager - [phone]  After 6pm go to [email or web address] - password [email or web address] [facility]  [facility]   Office  [phone]

## 2016-12-29 NOTE — Consult Note (Signed)
Patient was on vancomycin PTA via PICC. No baseline level obtained before dose was given in the ED. Another dose was then given this AM. Called pt home health agency and left a voicemail. Attempting to figure out what pt home dose was. Will need to draw a level prior to giving another dose. Random level scheduled for 10/11 @ 0800  Journii Nierman D [place], Pharm.D, BCPS Clinical Pharmacist

## 2016-12-29 NOTE — Consult Note (Signed)
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  Chronic nonhealing wound.  Albumin is 1.9 today.   Wound type:Stage 4 pressure injury Pressure Injury POA: Yes Measurement: 8 cm x 10 cm x 0.3 cm  Bony prominence evident.  Wound ZOX:WRUE and moist 10% slough, adherent yellow Drainage (amount, consistency, odor) Scant serosanguinous  No odor.  Periwound:scarring Dressing procedure/placement/frequency:Cleanse sacral wound with NS and pat gently dry.  Apply NS moist gauze to wound bed to keep moist and promote healing.  Cover with ABD pad twice daily.   WIll order mattress with low air loss feature.  Will not follow at this time.  Please re-consult if needed.  Maple Hudson RN BSN CWON Pager [phone]

## 2016-12-29 NOTE — Consult Note (Signed)
[facility]     Reason for Consult: sepsis, bacteremia,    Referring Physician: Karlton Lemon Date of Admission:  12/28/2016   Active Problems:   UTI (urinary tract infection)   HPI: [name] is a 83 y.o. female admitted with SBO. 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 Stop date was due to be 10/8. Now admitted with SBO. Currently moving bowels per family. Wound care note reviewed and shows chronic nonhealing wound with 8x10 cm x 0.3 cm with pink bed and 10% sloguh and no odor.     Past Medical History:  Diagnosis Date  . Arthritis   . Dementia   . Diabetes mellitus without complication ([place])   . Gout   . Hypertension   . Osteomyelitis ([place])    sacral wound   Past Surgical History:  Procedure Laterality Date  . CARDIAC SURGERY    . IR FLUORO GUIDE CV LINE LEFT  11/19/2016  . PACEMAKER INSERTION    . PICC line placement     Social History  Substance Use Topics  . Smoking status: Never Smoker  . Smokeless tobacco: Former Systems developer    Types: Snuff  . Alcohol use No   Family History  Problem Relation Age of Onset  . Diabetes Maternal Aunt     Allergies: No Known Allergies  Current antibiotics: Antibiotics Given (last 72 hours)    Date/Time Action Medication Dose Rate   12/28/16 1243 New Bag/Given   cefTRIAXone (ROCEPHIN) 1 g in dextrose 5 % 50 mL IVPB - Premix 1 g 100 mL/hr   12/28/16 1618 New Bag/Given   vancomycin (VANCOCIN) IVPB 1000 mg/200 mL premix 1,000 mg 200 mL/hr   12/29/16 0948 New Bag/Given   vancomycin (VANCOCIN) 1,250 mg in sodium chloride 0.9 % 250 mL IVPB 1,250 mg 166.7 mL/hr      MEDICATIONS: . enoxaparin (LOVENOX) injection  40 mg Subcutaneous Q24H  . Influenza vac split quadrivalent PF  0.5 mL Intramuscular Tomorrow-1000  . insulin aspart  0-5 Units Subcutaneous QHS  . insulin aspart  0-9 Units Subcutaneous TID WC  . rifampin  300 mg Oral Q12H    Review of Systems -  unable to obtain  OBJECTIVE: Temp:  [97.5 F (36.4 C)-97.7 F (36.5 C)] 97.5 F (36.4 C) (10/09 0348) Pulse Rate:  [70-75] 72 (10/09 0348) Resp:  [15-16] 15 (10/09 0348) BP: (131-151)/(61-91) 131/80 (10/09 0348) SpO2:  [99 %-100 %] 100 % (10/09 0348) Physical Exam  Constitutional:  Frail, lethargic HENT: [place]/AT, PERRLA, no scleral icterus Mouth/Throat: Oropharynx is clear and moist. No oropharyngeal exudate.  Cardiovascular: Normal rate, regular rhythm and normal heart sounds. Pulmonary/Chest: Effort normal and breath sounds normal. Neck = supple, no nuchal rigidity Abdominal: distende, tympanic  Lymphadenopathy: no cervical adenopathy. No axillary adenopathy Neurological: alert and oriented to person, place, and time.  Skin: wound care note Measurement: 8 cm x 10 cm x 0.3 cm  Bony prominence evident.  Wound AUQ:JFHL and moist 10% slough, adherent yellow Drainage  Scant serosanguinous No odor.  Periwound:scarring Wound KTG:YBWL and moist Psychiatric: unable to assess  LABS: Results for orders placed or performed during the hospital encounter of 12/28/16 (from the past 48 hour(s))  CBC     Status: Abnormal   Collection Time: 12/28/16  7:48 AM  Result Value Ref Range   WBC 16.8 (H) 3.6 - 11.0 K/uL   RBC 3.27 (L) 3.80 - 5.20 MIL/uL  Hemoglobin 9.3 (L) 12.0 - 16.0 g/dL   HCT 27.7 (L) 35.0 - 47.0 %   MCV 84.7 80.0 - 100.0 fL   MCH 28.4 26.0 - 34.0 pg   MCHC 33.5 32.0 - 36.0 g/dL   RDW 18.4 (H) 11.5 - 14.5 %   Platelets 224 150 - 440 K/uL  Comprehensive metabolic panel     Status: Abnormal   Collection Time: 12/28/16  7:48 AM  Result Value Ref Range   Sodium 142 135 - 145 mmol/L   Potassium 2.2 (LL) 3.5 - 5.1 mmol/L    Comment: CRITICAL RESULT CALLED TO, READ BACK BY AND VERIFIED WITH RACHEL VERDI AT 9379 ON 12/28/16 [place].    Chloride 108 101 - 111 mmol/L   CO2 26 22 - 32 mmol/L   Glucose, Bld 181 (H) 65 - 99 mg/dL   BUN 11 6 - 20 mg/dL   Creatinine, Ser 0.76 0.44 - 1.00  mg/dL   Calcium 8.7 (L) 8.9 - 10.3 mg/dL   Total Protein 6.1 (L) 6.5 - 8.1 g/dL   Albumin 1.9 (L) 3.5 - 5.0 g/dL   AST 22 15 - 41 U/L   ALT 12 (L) 14 - 54 U/L   Alkaline Phosphatase 113 38 - 126 U/L   Total Bilirubin 0.8 0.3 - 1.2 mg/dL   GFR calc non Af Amer >60 >60 mL/min   GFR calc Af Amer >60 >60 mL/min    Comment: (NOTE) The eGFR has been calculated using the CKD EPI equation. This calculation has not been validated in all clinical situations. eGFR's persistently <60 mL/min signify possible Chronic Kidney Disease.    Anion gap 8 5 - 15  Lactic acid, plasma     Status: None   Collection Time: 12/28/16  7:48 AM  Result Value Ref Range   Lactic Acid, Venous 1.6 0.5 - 1.9 mmol/L  Troponin I     Status: Abnormal   Collection Time: 12/28/16  7:48 AM  Result Value Ref Range   Troponin I 0.03 (HH) <0.03 ng/mL    Comment: CRITICAL RESULT CALLED TO, READ BACK BY AND VERIFIED WITH RACHEL VERDI AT 0240 ON 12/28/16 [place].   Blood culture (routine x 2)     Status: None (Preliminary result)   Collection Time: 12/28/16  7:48 AM  Result Value Ref Range   Specimen Description BLOOD RIGHT FA    Special Requests      BOTTLES DRAWN AEROBIC AND ANAEROBIC Blood Culture adequate volume   Culture NO GROWTH 1 DAY    Report Status PENDING   Urinalysis, Complete w Microscopic     Status: Abnormal   Collection Time: 12/28/16  7:48 AM  Result Value Ref Range   Color, Urine AMBER (A) YELLOW    Comment: BIOCHEMICALS MAY BE AFFECTED BY COLOR   APPearance TURBID (A) CLEAR   Specific Gravity, Urine 1.023 1.005 - 1.030   pH 5.0 5.0 - 8.0   Glucose, UA 50 (A) NEGATIVE mg/dL   Hgb urine dipstick MODERATE (A) NEGATIVE   Bilirubin Urine NEGATIVE NEGATIVE   Ketones, ur 5 (A) NEGATIVE mg/dL   Protein, ur 100 (A) NEGATIVE mg/dL   Nitrite NEGATIVE NEGATIVE   Leukocytes, UA MODERATE (A) NEGATIVE   RBC / HPF TOO NUMEROUS TO COUNT 0 - 5 RBC/hpf   WBC, UA TOO NUMEROUS TO COUNT 0 - 5 WBC/hpf   Bacteria, UA  MANY (A) NONE SEEN   Squamous Epithelial / LPF 0-5 (A) NONE SEEN   WBC  Clumps PRESENT   Urine Culture     Status: Abnormal (Preliminary result)   Collection Time: 12/28/16  7:48 AM  Result Value Ref Range   Specimen Description URINE, CLEAN CATCH    Special Requests NONE    Culture >=100,000 COLONIES/mL GRAM NEGATIVE RODS (A)    Report Status PENDING   Magnesium     Status: None   Collection Time: 12/28/16  7:55 AM  Result Value Ref Range   Magnesium 2.2 1.7 - 2.4 mg/dL  Blood culture (routine x 2)     Status: None (Preliminary result)   Collection Time: 12/28/16 10:51 AM  Result Value Ref Range   Specimen Description BLOOD RIGHT HAND    Special Requests      BOTTLES DRAWN AEROBIC AND ANAEROBIC Blood Culture adequate volume   Culture NO GROWTH 1 DAY    Report Status PENDING   Glucose, capillary     Status: Abnormal   Collection Time: 12/28/16  4:48 PM  Result Value Ref Range   Glucose-Capillary 150 (H) 65 - 99 mg/dL  Glucose, capillary     Status: None   Collection Time: 12/28/16  8:08 PM  Result Value Ref Range   Glucose-Capillary 96 65 - 99 mg/dL  Basic metabolic panel     Status: Abnormal   Collection Time: 12/29/16  4:17 AM  Result Value Ref Range   Sodium 143 135 - 145 mmol/L   Potassium 3.8 3.5 - 5.1 mmol/L   Chloride 116 (H) 101 - 111 mmol/L   CO2 24 22 - 32 mmol/L   Glucose, Bld 81 65 - 99 mg/dL   BUN 13 6 - 20 mg/dL   Creatinine, Ser 0.64 0.44 - 1.00 mg/dL   Calcium 7.9 (L) 8.9 - 10.3 mg/dL   GFR calc non Af Amer >60 >60 mL/min   GFR calc Af Amer >60 >60 mL/min    Comment: (NOTE) The eGFR has been calculated using the CKD EPI equation. This calculation has not been validated in all clinical situations. eGFR's persistently <60 mL/min signify possible Chronic Kidney Disease.    Anion gap 3 (L) 5 - 15  CBC     Status: Abnormal   Collection Time: 12/29/16  4:17 AM  Result Value Ref Range   WBC 19.4 (H) 3.6 - 11.0 K/uL   RBC 2.74 (L) 3.80 - 5.20 MIL/uL    Hemoglobin 7.7 (L) 12.0 - 16.0 g/dL   HCT 23.4 (L) 35.0 - 47.0 %   MCV 85.3 80.0 - 100.0 fL   MCH 28.2 26.0 - 34.0 pg   MCHC 33.1 32.0 - 36.0 g/dL   RDW 19.0 (H) 11.5 - 14.5 %   Platelets 175 150 - 440 K/uL  Glucose, capillary     Status: None   Collection Time: 12/29/16  7:53 AM  Result Value Ref Range   Glucose-Capillary 76 65 - 99 mg/dL  Glucose, capillary     Status: None   Collection Time: 12/29/16 11:38 AM  Result Value Ref Range   Glucose-Capillary 77 65 - 99 mg/dL   No components found for: ESR, C REACTIVE PROTEIN MICRO: Recent Results (from the past 720 hour(s))  Blood culture (routine x 2)     Status: None (Preliminary result)   Collection Time: 12/28/16  7:48 AM  Result Value Ref Range Status   Specimen Description BLOOD RIGHT FA  Final   Special Requests   Final    BOTTLES DRAWN AEROBIC AND ANAEROBIC Blood Culture adequate volume  Culture NO GROWTH 1 DAY  Final   Report Status PENDING  Incomplete  Urine Culture     Status: Abnormal (Preliminary result)   Collection Time: 12/28/16  7:48 AM  Result Value Ref Range Status   Specimen Description URINE, CLEAN CATCH  Final   Special Requests NONE  Final   Culture >=100,000 COLONIES/mL GRAM NEGATIVE RODS (A)  Final   Report Status PENDING  Incomplete  Blood culture (routine x 2)     Status: None (Preliminary result)   Collection Time: 12/28/16 10:51 AM  Result Value Ref Range Status   Specimen Description BLOOD RIGHT HAND  Final   Special Requests   Final    BOTTLES DRAWN AEROBIC AND ANAEROBIC Blood Culture adequate volume   Culture NO GROWTH 1 DAY  Final   Report Status PENDING  Incomplete    IMAGING: Ct Head Wo Contrast  Result Date: 12/28/2016 CLINICAL DATA:  Encephalopathy EXAM: CT HEAD WITHOUT CONTRAST TECHNIQUE: Contiguous axial images were obtained from the base of the skull through the vertex without intravenous contrast. COMPARISON:  06/12/2015 FINDINGS: Brain: There is atrophy and chronic small vessel  disease changes. No acute intracranial abnormality. Specifically, no hemorrhage, hydrocephalus, mass lesion, acute infarction, or significant intracranial injury. Vascular: No hyperdense vessel or unexpected calcification. Skull: No acute calvarial abnormality. Sinuses/Orbits: Visualized paranasal sinuses and mastoids clear. Orbital soft tissues unremarkable. Other: None IMPRESSION: No acute intracranial abnormality. Atrophy, chronic microvascular disease. Electronically Signed   By: Rolm Baptise M.D.   On: 12/28/2016 08:46   Ct Abdomen Pelvis W Contrast  Result Date: 12/28/2016 CLINICAL DATA:  Encephalopathy. Sacral decubitus ulcer. Chronic Foley with cloudy urine. EXAM: CT ABDOMEN AND PELVIS WITH CONTRAST TECHNIQUE: Multidetector CT imaging of the abdomen and pelvis was performed using the standard protocol following bolus administration of intravenous contrast. CONTRAST:  79m ISOVUE-300 IOPAMIDOL (ISOVUE-300) INJECTION 61% COMPARISON:  None. FINDINGS: Lower chest: Partially visualized bilateral pleural effusions with compressive atelectasis in the left lower lobe and atelectasis or consolidation in the right lower lobe. Cardiomegaly. Pacemaker leads. Hepatobiliary: Multiple hypodense lesions scattered in both hepatic lobes with the largest measuring 11 mm in segment IVa and 12 mm in segment VI. These likely represent cysts though are predominantly too small to fully characterize. Small calcifications in the right hepatic lobe. Grossly unremarkable gallbladder. No biliary dilatation. Pancreas: Unremarkable. Spleen: Unremarkable. Adrenals/Urinary Tract: Unremarkable adrenal glands. Small nonobstructing calculi in both kidneys 5 mm. Low density lesion in the lower pole of the right kidney, too small to fully characterize. Bladder decompressed by a Foley catheter. Stomach/Bowel: Mild proximal distension of the stomach by fluid. Small bowel loops are fluid-filled and diffusely dilated throughout the abdomen,  measuring up to approximately 4 cm in diameter. There is a transition point in the central abdomen just left of midline (series 3, image 47) with the more distal ileum being completely decompressed. A moderate amount of stool is present throughout the colon, and there is a large amount of stool within the distended rectum. The appendix is unremarkable. Vascular/Lymphatic: Abdominal aortic atherosclerosis without aneurysm. No enlarged lymph nodes. Reproductive: Status post hysterectomy. No adnexal masses. Other: Small volume ascites. No loculated fluid collection. No pneumoperitoneum. Diffuse body wall edema. Musculoskeletal: Decubitus ulcer over the lower sacrum/coccyx with a small amount of subcutaneous gas to the left of the tip of the coccyx without drainable fluid collection. Osseous demineralization without gross focal osseous destructive changes. Advanced lower lumbar facet arthrosis with grade 2 anterolisthesis of L4 on L5. Degenerative  changes of the right greater than left hips. IMPRESSION: 1. Moderate diffuse small bowel dilatation with transition to decompressed distal ileum, concerning for obstruction. 2. Moderate colonic stool burden. Large amount of rectal stool concerning for impaction. 3. Sacral decubitus ulcer.  No abscess. 4. Partially visualized bilateral pleural effusions and right greater than left lower lung atelectasis/consolidation. 5. Anasarca. 6. Bilateral nonobstructing nephrolithiasis. 7.  Aortic Atherosclerosis (ICD10-I70.0). Electronically Signed   By: Logan Bores M.D.   On: 12/28/2016 11:44   Dg Chest Portable 1 View  Result Date: 12/28/2016 CLINICAL DATA:  Altered mental status. EXAM: PORTABLE CHEST 1 VIEW COMPARISON:  Radiograph of November 14, 2016. FINDINGS: Stable cardiomegaly. Right-sided pacemaker is unchanged. No pneumothorax is noted. Minimal left basilar subsegmental atelectasis or scarring is noted. Improved opacity is noted in the right upper and lower lobes concerning  for improved edema or infiltrate. Mild right pleural effusion remains. Stable chronic dislocation of right shoulder is noted. IMPRESSION: Decreased right lung opacity is noted concerning for improved edema or infiltrate, with mild right pleural effusion present. Electronically Signed   By: Marijo Conception, M.D.   On: 12/28/2016 09:07    Assessment:   Jasmine Briggs is a 83 y.o. female with advanced dementia, largely bedbound, hx healing decub ulcers admitted with SBP. 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 Most recent otpt labs 10/1 with wbc 5.8, hgb 8.1, plt 324 and cr 0.57 crp 69. Stop date was 10/8. Ua on admit TNTC WBC Current issue is bowel related and not related to her PPM  Infection nor decub. No fevers. Does seem to have UTI with TNTC WBC and E coli on ucx  Recommendations Dc vanco and rifampin Cont ctx pending ucx results.  Thank you very much for allowing me to participate in the care of this patient. Please call with questions.   Cheral Marker. Ola Spurr, MD

## 2016-12-30 DIAGNOSIS — K56609 Unspecified intestinal obstruction, unspecified as to partial versus complete obstruction: Secondary | ICD-10-CM

## 2016-12-30 DIAGNOSIS — Z515 Encounter for palliative care: Secondary | ICD-10-CM

## 2016-12-30 LAB — CBC
HCT: 25.2 % — ABNORMAL LOW (ref 35.0–47.0)
Hemoglobin: 8.2 g/dL — ABNORMAL LOW (ref 12.0–16.0)
MCH: 28.5 pg (ref 26.0–34.0)
MCHC: 32.7 g/dL (ref 32.0–36.0)
MCV: 87.1 fL (ref 80.0–100.0)
PLATELETS: 187 10*3/uL (ref 150–440)
RBC: 2.89 MIL/uL — AB (ref 3.80–5.20)
RDW: 19.4 % — AB (ref 11.5–14.5)
WBC: 15.1 10*3/uL — AB (ref 3.6–11.0)

## 2016-12-30 LAB — GLUCOSE, CAPILLARY
GLUCOSE-CAPILLARY: 104 mg/dL — AB (ref 65–99)
GLUCOSE-CAPILLARY: 81 mg/dL (ref 65–99)
Glucose-Capillary: 81 mg/dL (ref 65–99)
Glucose-Capillary: 70 mg/dL (ref 65–99)

## 2016-12-30 LAB — BASIC METABOLIC PANEL
ANION GAP: 4 — AB (ref 5–15)
BUN: 14 mg/dL (ref 6–20)
CALCIUM: 8 mg/dL — AB (ref 8.9–10.3)
CO2: 21 mmol/L — ABNORMAL LOW (ref 22–32)
Chloride: 118 mmol/L — ABNORMAL HIGH (ref 101–111)
Creatinine, Ser: 0.73 mg/dL (ref 0.44–1.00)
GLUCOSE: 86 mg/dL (ref 65–99)
POTASSIUM: 4.3 mmol/L (ref 3.5–5.1)
SODIUM: 143 mmol/L (ref 135–145)

## 2016-12-30 LAB — URINE CULTURE

## 2016-12-30 MED ORDER — ORAL CARE MOUTH RINSE
15.0000 mL | Freq: Two times a day (BID) | OROMUCOSAL | Status: DC
  Administered 2017-01-01 (×2): 15 mL via OROMUCOSAL

## 2016-12-30 MED ORDER — PANTOPRAZOLE SODIUM 40 MG IV SOLR
40.0000 mg | Freq: Two times a day (BID) | INTRAVENOUS | Status: DC
  Administered 2016-12-30 – 2017-01-01 (×6): 40 mg via INTRAVENOUS
  Filled 2016-12-30 (×6): qty 40

## 2016-12-30 MED ORDER — CEFAZOLIN SODIUM-DEXTROSE 1-4 GM/50ML-% IV SOLN
1.0000 g | Freq: Two times a day (BID) | INTRAVENOUS | Status: DC
  Administered 2016-12-31 – 2017-01-01 (×3): 1 g via INTRAVENOUS
  Filled 2016-12-30 (×4): qty 50

## 2016-12-30 NOTE — Progress Notes (Signed)
[employer] - [place] at [facility]                                                                                                                                                                                  Patient Demographics   Jasmine Briggs, is a 83 y.o. female, DOB - [birth year], ZOX:[id number]  Admit date - 12/28/2016   Admitting Physician Shaune Pollack, MD  Outpatient Primary MD for the patient is Gladman, Cheral Marker, MD   LOS - 2  Subjective: Pt had a bowel movements with Cundari color stools and bright red according to nurse Patient's eyes closed daughter states that she was awake earlier  Review of Systems:   CONSTITUTIONAL: Unable to provide due to mental status  Vitals:   Vitals:   12/29/16 1308 12/29/16 2045 12/30/16 0549 12/30/16 1137  BP: 132/89 117/70 (!) 155/94 (!) 97/55  Pulse: 78 70 70 (!) 44  Resp: Temp: (!) 97.5 F (36.4 C) (!) 97.5 F (36.4 C) (!) 97.5 F (36.4 C) 98.6 F (37 C)  TempSrc: Oral Oral    SpO2: 100% 100% 91% 100%  Weight:      Height:        Wt Readings from Last 3 Encounters:  12/28/16 110 lb (49.9 kg)  11/21/16 106 lb (48.1 kg)  11/20/16 106 lb 8 oz (48.3 kg)    No intake or output data in the 24 hours ending 12/30/16 1200  Physical Exam:   GENERAL:Chronically ill-appearing HEAD, EYES, EARS, NOSE AND THROAT: Atraumatic, normocephalic.  Pupils equal and reactive to light. Sclerae anicteric. No conjunctival injection. No oro-pharyngeal erythema.  NECK: Supple. There is no jugular venous distention. No bruits, no lymphadenopathy, no thyromegaly.  HEART: Regular rate and rhythm,. No murmurs, no rubs, no clicks.  LUNGS: Clear to auscultation bilaterally. No rales or rhonchi. No wheezes.  ABDOMEN: Soft, flat,Positively distended , decreased bowel sounds EXTREMITIES: Swelling of the right upper extremity  NEUROLOGIC: pt drowsy SKIN: Moist and warm with no rashes appreciated.  Psych: Not anxious,  depressed LN: No inguinal LN enlargement    Antibiotics   Anti-infectives    Start     Dose/Rate Route Frequency Ordered Stop   12/29/16 1600  cefTRIAXone (ROCEPHIN) 1 g in dextrose 5 % 50 mL IVPB     1 g 100 mL/hr over 30 Minutes Intravenous Every 24 hours 12/28/16 1602     12/29/16 1000  vancomycin (VANCOCIN) 1,250 mg in sodium chloride 0.9 % 250 mL IVPB  Status:  Discontinued     1,250 mg 166.7 mL/hr over 90 Minutes Intravenous Every 48 hours 12/28/16 1602 12/29/16 1306  12/28/16 1600  rifampin (RIFADIN) capsule 300 mg  Status:  Discontinued    Comments:  X 6 weeks     300 mg Oral Every 12 hours 12/28/16 1512 12/29/16 1944   12/28/16 1545  vancomycin (VANCOCIN) IVPB 1000 mg/200 mL premix     1,000 mg 200 mL/hr over 60 Minutes Intravenous  Once 12/28/16 1544 12/28/16 1731   12/28/16 1230  cefTRIAXone (ROCEPHIN) 1 g in dextrose 5 % 50 mL IVPB - Premix     1 g 100 mL/hr over 30 Minutes Intravenous  Once 12/28/16 1229 12/28/16 1313      Medications   Scheduled Meds: . Influenza vac split quadrivalent PF  0.5 mL Intramuscular Tomorrow-1000  . insulin aspart  0-5 Units Subcutaneous QHS  . insulin aspart  0-9 Units Subcutaneous TID WC  . mouth rinse  15 mL Mouth Rinse q12n4p  . pantoprazole (PROTONIX) IV  40 mg Intravenous Q12H   Continuous Infusions: . 0.9 % NaCl with KCl 40 mEq / L 75 mL/hr (12/29/16 1733)  . cefTRIAXone (ROCEPHIN)  IV Stopped (12/29/16 1803)   PRN Meds:.acetaminophen **OR** acetaminophen, albuterol, bisacodyl, hydrALAZINE, ondansetron **OR** ondansetron (ZOFRAN) IV, senna-docusate   Data Review:   Micro Results Recent Results (from the past 240 hour(s))  Blood culture (routine x 2)     Status: None (Preliminary result)   Collection Time: 12/28/16  7:48 AM  Result Value Ref Range Status   Specimen Description BLOOD RIGHT FA  Final   Special Requests   Final    BOTTLES DRAWN AEROBIC AND ANAEROBIC Blood Culture adequate volume   Culture NO GROWTH 2  DAYS  Final   Report Status PENDING  Incomplete  Urine Culture     Status: Abnormal   Collection Time: 12/28/16  7:48 AM  Result Value Ref Range Status   Specimen Description URINE, CLEAN CATCH  Final   Special Requests NONE  Final   Culture >=100,000 COLONIES/mL ESCHERICHIA COLI (A)  Final   Report Status 12/30/2016 FINAL  Final   Organism ID, Bacteria ESCHERICHIA COLI (A)  Final      Susceptibility   Escherichia coli - MIC*    AMPICILLIN 8 SENSITIVE Sensitive     CEFAZOLIN <=4 SENSITIVE Sensitive     CEFTRIAXONE <=1 SENSITIVE Sensitive     CIPROFLOXACIN <=0.25 SENSITIVE Sensitive     GENTAMICIN <=1 SENSITIVE Sensitive     IMIPENEM <=0.25 SENSITIVE Sensitive     NITROFURANTOIN <=16 SENSITIVE Sensitive     TRIMETH/SULFA <=20 SENSITIVE Sensitive     AMPICILLIN/SULBACTAM 4 SENSITIVE Sensitive     PIP/TAZO <=4 SENSITIVE Sensitive     Extended ESBL NEGATIVE Sensitive     * >=100,000 COLONIES/mL ESCHERICHIA COLI  Blood culture (routine x 2)     Status: None (Preliminary result)   Collection Time: 12/28/16 10:51 AM  Result Value Ref Range Status   Specimen Description BLOOD RIGHT HAND  Final   Special Requests   Final    BOTTLES DRAWN AEROBIC AND ANAEROBIC Blood Culture adequate volume   Culture NO GROWTH 2 DAYS  Final   Report Status PENDING  Incomplete    Radiology Reports Ct Head Wo Contrast  Result Date: 12/28/2016 CLINICAL DATA:  Encephalopathy EXAM: CT HEAD WITHOUT CONTRAST TECHNIQUE: Contiguous axial images were obtained from the base of the skull through the vertex without intravenous contrast. COMPARISON:  06/12/2015 FINDINGS: Brain: There is atrophy and chronic small vessel disease changes. No acute intracranial abnormality. Specifically, no hemorrhage, hydrocephalus, mass lesion,  acute infarction, or significant intracranial injury. Vascular: No hyperdense vessel or unexpected calcification. Skull: No acute calvarial abnormality. Sinuses/Orbits: Visualized paranasal sinuses  and mastoids clear. Orbital soft tissues unremarkable. Other: None IMPRESSION: No acute intracranial abnormality. Atrophy, chronic microvascular disease. Electronically Signed   By: Charlett Nose M.D.   On: 12/28/2016 08:46   Ct Abdomen Pelvis W Contrast  Result Date: 12/28/2016 CLINICAL DATA:  Encephalopathy. Sacral decubitus ulcer. Chronic Foley with cloudy urine. EXAM: CT ABDOMEN AND PELVIS WITH CONTRAST TECHNIQUE: Multidetector CT imaging of the abdomen and pelvis was performed using the standard protocol following bolus administration of intravenous contrast. CONTRAST:  75mL ISOVUE-300 IOPAMIDOL (ISOVUE-300) INJECTION 61% COMPARISON:  None. FINDINGS: Lower chest: Partially visualized bilateral pleural effusions with compressive atelectasis in the left lower lobe and atelectasis or consolidation in the right lower lobe. Cardiomegaly. Pacemaker leads. Hepatobiliary: Multiple hypodense lesions scattered in both hepatic lobes with the largest measuring 11 mm in segment IVa and 12 mm in segment VI. These likely represent cysts though are predominantly too small to fully characterize. Small calcifications in the right hepatic lobe. Grossly unremarkable gallbladder. No biliary dilatation. Pancreas: Unremarkable. Spleen: Unremarkable. Adrenals/Urinary Tract: Unremarkable adrenal glands. Small nonobstructing calculi in both kidneys 5 mm. Low density lesion in the lower pole of the right kidney, too small to fully characterize. Bladder decompressed by a Foley catheter. Stomach/Bowel: Mild proximal distension of the stomach by fluid. Small bowel loops are fluid-filled and diffusely dilated throughout the abdomen, measuring up to approximately 4 cm in diameter. There is a transition point in the central abdomen just left of midline (series 3, image 47) with the more distal ileum being completely decompressed. A moderate amount of stool is present throughout the colon, and there is a large amount of stool within the  distended rectum. The appendix is unremarkable. Vascular/Lymphatic: Abdominal aortic atherosclerosis without aneurysm. No enlarged lymph nodes. Reproductive: Status post hysterectomy. No adnexal masses. Other: Small volume ascites. No loculated fluid collection. No pneumoperitoneum. Diffuse body wall edema. Musculoskeletal: Decubitus ulcer over the lower sacrum/coccyx with a small amount of subcutaneous gas to the left of the tip of the coccyx without drainable fluid collection. Osseous demineralization without gross focal osseous destructive changes. Advanced lower lumbar facet arthrosis with grade 2 anterolisthesis of L4 on L5. Degenerative changes of the right greater than left hips. IMPRESSION: 1. Moderate diffuse small bowel dilatation with transition to decompressed distal ileum, concerning for obstruction. 2. Moderate colonic stool burden. Large amount of rectal stool concerning for impaction. 3. Sacral decubitus ulcer.  No abscess. 4. Partially visualized bilateral pleural effusions and right greater than left lower lung atelectasis/consolidation. 5. Anasarca. 6. Bilateral nonobstructing nephrolithiasis. 7.  Aortic Atherosclerosis (ICD10-I70.0). Electronically Signed   By: Sebastian Ache M.D.   On: 12/28/2016 11:44   Dg Chest Portable 1 View  Result Date: 12/28/2016 CLINICAL DATA:  Altered mental status. EXAM: PORTABLE CHEST 1 VIEW COMPARISON:  Radiograph of November 14, 2016. FINDINGS: Stable cardiomegaly. Right-sided pacemaker is unchanged. No pneumothorax is noted. Minimal left basilar subsegmental atelectasis or scarring is noted. Improved opacity is noted in the right upper and lower lobes concerning for improved edema or infiltrate. Mild right pleural effusion remains. Stable chronic dislocation of right shoulder is noted. IMPRESSION: Decreased right lung opacity is noted concerning for improved edema or infiltrate, with mild right pleural effusion present. Electronically Signed   By: Lupita Raider,  M.D.   On: 12/28/2016 09:07     CBC  Recent Labs Lab 12/28/16  1610 12/29/16 0417 12/30/16 0545  WBC 16.8* 19.4* 15.1*  HGB 9.3* 7.7* 8.2*  HCT 27.7* 23.4* 25.2*  PLT 224 175 187  MCV 84.7 85.3 87.1  MCH 28.4 28.2 28.5  MCHC 33.5 33.1 32.7  RDW 18.4* 19.0* 19.4*    Chemistries   Recent Labs Lab 12/28/16 0748 12/28/16 0755 12/29/16 0417 12/30/16 0545  NA 142  --  143 143  K 2.2*  --  3.8 4.3  CL 108  --  116* 118*  CO2 26  --  24 21*  GLUCOSE 181*  --  81 86  BUN 11  --  13 14  CREATININE 0.76  --  0.64 0.73  CALCIUM 8.7*  --  7.9* 8.0*  MG  --  2.2  --   --   AST 22  --   --   --   ALT 12*  --   --   --   ALKPHOS 113  --   --   --   BILITOT 0.8  --   --   --    ------------------------------------------------------------------------------------------------------------------ estimated creatinine clearance is 34.6 mL/min (by C-G formula based on SCr of 0.73 mg/dL). ------------------------------------------------------------------------------------------------------------------ No results for input(s): HGBA1C in the last 72 hours. ------------------------------------------------------------------------------------------------------------------ No results for input(s): CHOL, HDL, LDLCALC, TRIG, CHOLHDL, LDLDIRECT in the last 72 hours. ------------------------------------------------------------------------------------------------------------------ No results for input(s): TSH, T4TOTAL, T3FREE, THYROIDAB in the last 72 hours.  Invalid input(s): FREET3 ------------------------------------------------------------------------------------------------------------------ No results for input(s): VITAMINB12, FOLATE, FERRITIN, TIBC, IRON, RETICCTPCT in the last 72 hours.  Coagulation profile No results for input(s): INR, PROTIME in the last 168 hours.  No results for input(s): DDIMER in the last 72 hours.  Cardiac Enzymes  Recent Labs Lab 12/28/16 0748  TROPONINI  0.03*   ------------------------------------------------------------------------------------------------------------------ Invalid input(s): POCBNP    Assessment & Plan  Patient is a 83 year old with dementia  1.UTI with leukocytosis. Continue IV Rocephin Appreciate ID input all other antibiotics have been discontinued  2. Acute mental encephalopathy due to above. No significant imprvment   3. Small bowel obstruction. Patient having bowel movements Appreciate surgical input Family is not interested in any surgery   4. bright red blood per rectum: likely related to internal hemorrhoids with patient receiving Fleet enema I stop Lovenox Start patient on protonix  5.Sacral DU infection with osteomyelitis.antibiotics discontinued per ID  6. Dementia. Aspiration and fall precaution.  7. Diabetes. Sliding scale.  8. Hypertension. IV hydralazine when necessary.  The patient has very poor prognosis. Palliative care consult. DO NOT RESUSCITATE      Code Status Orders        Start     Ordered   12/28/16 1513  Do not attempt resuscitation (DNR)  Continuous    Question Answer Comment  In the event of cardiac or respiratory ARREST Do not call a "code blue"   In the event of cardiac or respiratory ARREST Do not perform Intubation, CPR, defibrillation or ACLS   In the event of cardiac or respiratory ARREST Use medication by any route, position, wound care, and other measures to relive pain and suffering. May use oxygen, suction and manual treatment of airway obstruction as needed for comfort.      12/28/16 1512    Code Status History    Date Active Date Inactive Code Status Order ID Comments User Context   11/12/2016  1:52 PM 11/20/2016  5:05 PM DNR [id number]  Katha Hamming, MD ED   11/12/2016  1:39 PM 11/12/2016  1:52  PM Full Code [id number]  Katha Hamming, MD ED   07/10/2015 10:55 PM 07/26/2015  8:43 PM DNR [id number]  Oralia Manis, MD Inpatient   06/24/2015  5:41 PM  06/26/2015  7:08 PM DNR [id number]  Suan Halter, MD Inpatient   06/21/2015  8:04 PM 06/24/2015  5:41 PM Full Code [id number]  Houston Siren, MD Inpatient   06/12/2015  8:35 PM 06/15/2015  7:56 PM Full Code [id number]  Enid Baas, MD Inpatient    Advance Directive Documentation     Most Recent Value  Type of Advance Directive  Healthcare Power of Attorney  Pre-existing out of facility DNR order (yellow form or pink MOST form)  -  "MOST" Form in Place?  -           Consults   Surgery  DVT Prophylaxis  Lovenox   Lab Results  Component Value Date   PLT 187 12/30/2016     Time Spent in minutes   Greater than 50% of time spent in care coordination and counseling patient regarding the condition and plan of care.   Auburn Bilberry M.D on 12/30/2016 at 12:00 PM  Between 7am to 6pm - Pager - [phone]  After 6pm go to [email or web address] - password [email or web address] [facility]  [facility]   Office  [phone]

## 2016-12-30 NOTE — Evaluation (Addendum)
Clinical/Bedside Swallow Evaluation Patient Details  Name: Jasmine Briggs MRN: [id number] Date of Birth: 01-07-1921  Today's Date: 12/30/2016 Time: SLP Start Time (ACUTE ONLY): 1300 SLP Stop Time (ACUTE ONLY): 1400 SLP Time Calculation (min) (ACUTE ONLY): 60 min  Past Medical History:  Past Medical History:  Diagnosis Date  . Arthritis   . Dementia   . Diabetes mellitus without complication (HCC)   . Gout   . Hypertension   . Osteomyelitis (HCC)    sacral wound   Past Surgical History:  Past Surgical History:  Procedure Laterality Date  . CARDIAC SURGERY    . IR FLUORO GUIDE CV LINE LEFT  11/19/2016  . PACEMAKER INSERTION    . PICC line placement     HPI:  Pt is a 83 y.o. female with a known history of Hypertension, diabetes, gout, sacral DU infection with osteomyelitis and dementia. The patient was recently admitted here for sacral DU infection with osteomyelitis, has been on vancomycin and cefepime per Dr. Sampson Goon. She was found more confused and sent from home to ED. She was found UTI with leukocytosis, treated with antibiotics in the ED. Pt has been seen by this service in 10/2016 w/ oropharyngeal phase dysphagia identified as well as risk for aspiration and need for a dysphagia diet w/ support and monitoring at meals. Pt has been consulted by Palliative Care at prior admission to determine poc goals. She is cared for by family in the home. Addendum: PRIOR to po trials this evaluation, pt exhibited min wet respirations and breathing; congestion.  Assessment / Plan / Recommendation Clinical Impression  Pt appears to present w/ moderate+ oropharyngel dysphagia w/ a moderate risk for aspiration. Pt has baseline diagnosis of oropharyngeal Dysphagia, Cognitive decline, and is on a Dysphagia level 1 diet w/ honey thick liquids at home cared for by family. Pt presents w/ few verbalizations baseline, and family states "she does not like MDs and Nurses which may be the reason why she does  not do much for you". Pt awakened w/ stim from SLP and accepted an oral swab for oral stimulation and hygiene(oral phase defensiveness note though).  Pt was given trials of puree and honey-thick liquids via TSP. Pt exhibited moderate+ oral holding and reduced awareness and coordination for bolus control for timely A-P transfer and swallowing. Pt required max encouragment and verbal/tactile cues to elicit a swallow after oral intake. Pt exhibited increased oral motions/movements but not immediate swallowing - this is often related to Cognitive decline/impact. Pt did not exhibit overt, immediate s/s of aspiration during the trials of puree or honey consistency liquid via TSP - though few trials accepted. No decline in vocal quality or respiratory status were noted w/ the few trials. Pt required mod-max cues to open mouth to accept boluses and to check for oral clearing. ST educated family on aspiration precautions - alternating solids and liquids, checking for oral clearing, and reducing distractions.  D/t pt's baseline Dysphagia diagnosis and current presentation(which appears close to/at her baseline for swallowing), recommend Dysphagia level 1(pureee) diet w/ honey consistency liquids via TSP; strict aspiration precautions. Overall, no immediate, overt s/s of aspiration were noted following the swallow. Pt is at increased risk for aspiration and pulmonary impact d/t oropharyngeal dysphagia, illness, and declined cognitive status. Recommend continue Dysphagia level 1 w/ Honey consisteny liquids diet at home for precautions; strict aspiration precautions; feeding support. ST services provided education to family present in room; no further recommendations to modify diet at this time. ST services will  sign off w/ NSG to reconsult if indicated d/t decline in swallowing status indicating ST services to intervene. Noted Palliative Care is following. NSG updated, MD updated.   SLP Visit Diagnosis: Dysphagia,  oropharyngeal phase (R13.12)    Aspiration Risk  Moderate aspiration risk    Diet Recommendation   Dysphagia level 1 (puree) w/ honey consistency liquids via TSP; strict aspiration precautions. Feeding support and check for oral clearing frequently during/post meals.  Medication Administration: Crushed with puree    Other  Recommendations Recommended Consults:  (dietician f/u; pallative care following) Oral Care Recommendations: Oral care BID;Staff/trained caregiver to provide oral care Other Recommendations: Order thickener from pharmacy   Follow up Recommendations  (TBD)      Frequency and Duration  n/a       Prognosis Prognosis for Safe Diet Advancement: Guarded Barriers to Reach Goals: Severity of deficits; baseline deficits     Swallow Study   General Date of Onset: 12/28/16 HPI: Pt is a 83 y.o. female with a known history of Hypertension, diabetes, gout, sacral DU infection with osteomyelitis and dementia. The patient was recently admitted here for sacral DU infection with osteomyelitis, has been on vancomycin and cefepime per Dr. Sampson Goon. She was found more confused and sent from home to ED. She was found UTI with leukocytosis, treated with antibiotics in the ED. Type of Study: Bedside Swallow Evaluation Previous Swallow Assessment: Pt had BSE on 11/17/16; recommended diet was Dysphagia 1 w/ honey consistency  Diet Prior to this Study: Dysphagia 1 (puree);Honey-thick liquids Temperature Spikes Noted: No (wbc elevated) Respiratory Status: Room air History of Recent Intubation: No Behavior/Cognition: Alert;Cooperative;Pleasant mood;Confused;Distractible;Requires cueing Oral Cavity Assessment: Within Functional Limits Oral Care Completed by SLP: Yes Oral Cavity - Dentition: Poor condition;Missing dentition Vision: Impaired for self-feeding Self-Feeding Abilities: Total assist Patient Positioning: Upright in bed Baseline Vocal Quality: Normal Volitional Swallow: Able  to elicit    Oral/Motor/Sensory Function Overall Oral Motor/Sensory Function: Within functional limits (for bolus management )   Ice Chips Ice chips: Not tested   Thin Liquid Thin Liquid: Not tested    Nectar Thick Nectar Thick Liquid: Not tested   Honey Thick Honey Thick Liquid: Impaired Presentation: Spoon (3 trials) Oral Phase Impairments: Poor awareness of bolus;Reduced lingual movement/coordination Oral Phase Functional Implications: Oral holding (mod-max holding) Pharyngeal Phase Impairments: Suspected delayed Swallow   Puree Puree: Impaired (3 trials) Presentation: Spoon Oral Phase Impairments: Reduced lingual movement/coordination;Poor awareness of bolus Oral Phase Functional Implications: Prolonged oral transit;Oral holding;Right lateral sulci pocketing Pharyngeal Phase Impairments: Suspected delayed Swallow   Solid   GO   Solid: Not tested       Jasmine Briggs, SLP-Graduate Student Jasmine Briggs 12/30/2016,1:48 PM   This information has been reviewed and agreed upon by this supervising clinician.  This patient note, response to treatment and overall treatment plan has been reviewed and this clinician agrees with the information provided.  12/30/16, 2:21 PM [phone] Jerilynn Som, MS, CCC-SLP

## 2016-12-30 NOTE — Progress Notes (Signed)
[facility] INFECTIOUS DISEASE PROGRESS NOTE Date of Admission:  12/28/2016     ID: Jasmine Briggs is a 83 y.o. 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 Ate some and bowels moving. Has some rattling when breathing per family after eating.   ROS  Unable to obtain  Medications:  Antibiotics Given (last 72 hours)    Date/Time Action Medication Dose Rate   12/28/16 1243 New Bag/Given   cefTRIAXone (ROCEPHIN) 1 g in dextrose 5 % 50 mL IVPB - Premix 1 g 100 mL/hr   12/28/16 1618 New Bag/Given   vancomycin (VANCOCIN) IVPB 1000 mg/200 mL premix 1,000 mg 200 mL/hr   12/29/16 0948 New Bag/Given   vancomycin (VANCOCIN) 1,250 mg in sodium chloride 0.9 % 250 mL IVPB 1,250 mg 166.7 mL/hr   12/29/16 1733 New Bag/Given   cefTRIAXone (ROCEPHIN) 1 g in dextrose 5 % 50 mL IVPB 1 g 100 mL/hr     . Influenza vac split quadrivalent PF  0.5 mL Intramuscular Tomorrow-1000  . insulin aspart  0-5 Units Subcutaneous QHS  . insulin aspart  0-9 Units Subcutaneous TID WC  . mouth rinse  15 mL Mouth Rinse q12n4p  . pantoprazole (PROTONIX) IV  40 mg Intravenous Q12H    Objective: Vital signs in last 24 hours: Temp:  [97.5 F (36.4 C)-98.6 F (37 C)] 98.6 F (37 C) (10/10 1137) Pulse Rate:  [44-70] 70 (10/10 1358) Resp:  [16-18] 16 (10/10 1137) BP: (97-155)/(55-94) 104/80 (10/10 1208) SpO2:  [91 %-100 %] 92 % (10/10 1358) Constitutional:  Frail, lethargic,  HENT: [place]/AT, PERRLA, no scleral icterus Mouth/Throat: Oropharynx is clear and moist. No oropharyngeal exudate.  Cardiovascular: Normal rate, regular rhythm and normal heart sounds. Pulmonary/Chest: Rhonchi bil  Neck = supple, no nuchal rigidity Abdominal: distende, tympanic  Lymphadenopathy: no cervical adenopathy. No axillary adenopathy Neurological:  alert and oriented to person, place, and time.  Skin: wound care note Measurement: 8 cm x 10 cm x 0.3 cm Bony prominence evident.  Wound RUE:AVWU and moist 10% slough, adherent yellow Drainage  Scant serosanguinous No odor.  Periwound:scarring Wound JWJ:XBJY and moist Psychiatric: unable to assess  Lab Results  Recent Labs  12/29/16 0417 12/30/16 0545  WBC 19.4* 15.1*  HGB 7.7* 8.2*  HCT 23.4* 25.2*  NA 143 143  K 3.8 4.3  CL 116* 118*  CO2 24 21*  BUN 13 14  CREATININE 0.64 0.73    Microbiology: Results for orders placed or performed during the hospital encounter of 12/28/16  Blood culture (routine x 2)     Status: None (Preliminary result)   Collection Time: 12/28/16  7:48 AM  Result Value Ref Range Status   Specimen Description BLOOD RIGHT FA  Final   Special Requests   Final    BOTTLES DRAWN AEROBIC AND ANAEROBIC Blood Culture adequate volume   Culture NO GROWTH 2 DAYS  Final   Report Status PENDING  Incomplete  Urine Culture     Status: Abnormal   Collection Time: 12/28/16  7:48 AM  Result Value Ref Range Status   Specimen Description URINE, CLEAN CATCH  Final   Special Requests NONE  Final   Culture >=100,000 COLONIES/mL ESCHERICHIA COLI (A)  Final   Report Status 12/30/2016 FINAL  Final   Organism ID, Bacteria ESCHERICHIA COLI (A)  Final      Susceptibility   Escherichia coli - MIC*    AMPICILLIN 8 SENSITIVE Sensitive     CEFAZOLIN <=4 SENSITIVE Sensitive     CEFTRIAXONE <=1 SENSITIVE Sensitive     CIPROFLOXACIN <=0.25 SENSITIVE Sensitive     GENTAMICIN <=1 SENSITIVE Sensitive     IMIPENEM <=0.25 SENSITIVE Sensitive     NITROFURANTOIN <=16 SENSITIVE Sensitive     TRIMETH/SULFA <=20 SENSITIVE Sensitive     AMPICILLIN/SULBACTAM 4 SENSITIVE Sensitive     PIP/TAZO <=4 SENSITIVE Sensitive     Extended ESBL NEGATIVE Sensitive     * >=100,000 COLONIES/mL ESCHERICHIA COLI  Blood culture (routine x 2)     Status: None (Preliminary result)   Collection Time:  12/28/16 10:51 AM  Result Value Ref Range Status   Specimen Description BLOOD RIGHT HAND  Final   Special Requests   Final    BOTTLES DRAWN AEROBIC AND ANAEROBIC Blood Culture adequate volume   Culture NO GROWTH 2 DAYS  Final   Report Status PENDING  Incomplete    Studies/Results: No results found.  Assessment/Plan: Jasmine Briggs is a 83 y.o. female with advanced dementia, largely bedbound, hx healing decub ulcers admitted with Small bowel obstruction. 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 Most recent otpt labs 10/1 with wbc 5.8, hgb 8.1, plt 324 and cr 0.57 crp 69. Stop date for vanco and rifampin was 10/8. Ua on this admit TNTC WBC Current issue is bowel related and not related to her PPM infection nor decub. No fevers. She is high aspiration risk   Recommendations Can change to oral keflex for UTI - rec 7 day course then can stop abx and follow. Continue wound care locally for the sacral wound. Thank you very much for the consult. Will follow with you.  Mick Sell   12/30/2016, 2:49 PM

## 2016-12-31 DIAGNOSIS — Z9189 Other specified personal risk factors, not elsewhere classified: Secondary | ICD-10-CM

## 2016-12-31 DIAGNOSIS — K117 Disturbances of salivary secretion: Secondary | ICD-10-CM

## 2016-12-31 LAB — CBC
HEMATOCRIT: 21.7 % — AB (ref 35.0–47.0)
HEMOGLOBIN: 7 g/dL — AB (ref 12.0–16.0)
MCH: 28.4 pg (ref 26.0–34.0)
MCHC: 32.4 g/dL (ref 32.0–36.0)
MCV: 87.7 fL (ref 80.0–100.0)
Platelets: 151 10*3/uL (ref 150–440)
RBC: 2.48 MIL/uL — AB (ref 3.80–5.20)
RDW: 19.5 % — ABNORMAL HIGH (ref 11.5–14.5)
WBC: 14.3 10*3/uL — AB (ref 3.6–11.0)

## 2016-12-31 LAB — GLUCOSE, CAPILLARY
GLUCOSE-CAPILLARY: 116 mg/dL — AB (ref 65–99)
GLUCOSE-CAPILLARY: 98 mg/dL (ref 65–99)
GLUCOSE-CAPILLARY: 91 mg/dL (ref 65–99)
GLUCOSE-CAPILLARY: 84 mg/dL (ref 65–99)
Glucose-Capillary: 62 mg/dL — ABNORMAL LOW (ref 65–99)

## 2016-12-31 MED ORDER — GLYCOPYRROLATE 0.2 MG/ML IJ SOLN
0.1000 mg | INTRAMUSCULAR | Status: DC | PRN
  Administered 2017-01-01 – 2017-01-02 (×3): 0.1 mg via INTRAVENOUS
  Filled 2016-12-31 (×6): qty 0.5

## 2016-12-31 MED ORDER — DEXTROSE 50 % IV SOLN
1.0000 | Freq: Once | INTRAVENOUS | Status: AC
  Administered 2016-12-31: 50 mL via INTRAVENOUS
  Filled 2016-12-31: qty 50

## 2016-12-31 NOTE — Progress Notes (Signed)
Daily Progress Note   Patient Name: Jasmine Briggs       Date: 12/31/2016 DOB: 1920-08-31  Age: 83 y.o. MRN#: [id number] Attending Physician: Shaune Pollack, MD Primary Care Physician: Kurtis Bushman, MD Admit Date: 12/28/2016  Reason for Consultation/Follow-up: Establishing goals of care  Subjective/GOC: Upon arrival to room, patient wakes to voice but will does not answer my questions or follow commands. She is responsive to family--minimal speech but will nod yes/no to questions. Taking sips of thickened apple juice. Audible secretions.   Daughter, sister, and two brothers at bedside. Discussed hospital diagnoses, interventions, and underlying co-morbidities. Jasmine Briggs speaks of her "doing better" since she has had bowel movements and able to tolerate diet recommended by speech therapy. I explained my concern with audible secretions and rhonchus lungs on auscultation secondary to aspiration--either from liquids or secretions. I explained high risk for respiratory decompensation due to aspiration. Discussed adding prn robinul for secretions but explaining to family this will NOT change continued risk for aspiration. Jasmine Briggs appears frustrated during the conversation speaking of improvements and then "keeps going back to the same things." He does confirm DNR/DNI. Encouraged they consider comfort measures if she should decompensate. Sister at bedside speaks of Ms. Wolford listening to doctors talking about hospice and has said "no."   Reassured the family of continued support from palliative during hospitalization.   Length of Stay: 3  Current Medications: Scheduled Meds:  . insulin aspart  0-5 Units Subcutaneous QHS  . insulin aspart  0-9 Units Subcutaneous TID WC  . mouth rinse  15 mL Mouth Rinse  q12n4p  . pantoprazole (PROTONIX) IV  40 mg Intravenous Q12H    Continuous Infusions: . 0.9 % NaCl with KCl 40 mEq / L 75 mL/hr (12/31/16 0037)  .  ceFAZolin (ANCEF) IV Stopped (12/31/16 1200)    PRN Meds: acetaminophen **OR** acetaminophen, albuterol, bisacodyl, glycopyrrolate, hydrALAZINE, ondansetron **OR** ondansetron (ZOFRAN) IV, senna-docusate  Physical Exam  Constitutional: She is easily aroused. She appears ill.  Cardiovascular: Regular rhythm.   Pulmonary/Chest: No accessory muscle usage. No tachypnea. No respiratory distress. She has rhonchi.  Shallow respirations. Audible secretions  Abdominal: She exhibits distension. There is no tenderness.  Neurological: She is easily aroused.  Will communicate minimally with family. Will nod yes/no to some questions  Skin: Skin is warm and dry.  Nursing note and vitals reviewed.          Vital Signs: BP (!) 88/55 (BP Location: Right Arm)   Pulse 73   Temp 97.6 F (36.4 C)   Resp (!) 22   Ht  (1.626 m)   Wt 49.9 kg (110 lb)   SpO2 99%   BMI 18.88 kg/m  SpO2: SpO2: 99 % O2 Device: O2 Device: Not Delivered O2 Flow Rate:    Intake/output summary: No intake or output data in the 24 hours ending 12/31/16 1404 LBM: Last BM Date: 12/31/16 Baseline Weight: Weight: 49.9 kg (110 lb) Most recent weight: Weight: 49.9 kg (110 lb)       Palliative Assessment/Data: PPS 20%   Flowsheet Rows     Most Recent Value  Intake Tab  Referral Department  Hospitalist  Unit at Time of Referral  Med/Surg Unit  Palliative Care Primary Diagnosis  Sepsis/Infectious Disease  Palliative Care Type  Return patient Palliative Care  Reason for referral  Clarify Goals of Care  Date first seen by Palliative Care  12/29/16  Clinical Assessment  Palliative Performance Scale Score  20%  Psychosocial & Spiritual Assessment  Palliative Care Outcomes  Patient/Family meeting held?  Yes  Who was at the meeting?  patient, daughter, two sons, sister    Palliative Care Outcomes  Counseled regarding hospice, Provided psychosocial or spiritual support, ACP counseling assistance, Provided end of life care assistance, Improved non-pain symptom therapy, Clarified goals of care      Patient Active Problem List   Diagnosis Date Noted  . SBO (small bowel obstruction) (HCC)   . Palliative care by specialist   . UTI (urinary tract infection) 12/28/2016  . Advance care planning   . Goals of care, counseling/discussion   . Encephalopathy   . Protein-calorie malnutrition, severe 11/13/2016  . Acute respiratory failure with hypoxia (HCC)   . MRSA bacteremia   . Palliative care encounter   . Sepsis (HCC) 11/12/2016  . Decubitus ulcer of sacral region, stage 3 (HCC) 07/26/2015  . Acute on chronic renal failure (HCC) 07/26/2015  . Hypernatremia 07/26/2015  . Hypokalemia 07/26/2015  . Encephalopathy, metabolic 07/26/2015  . Acute posthemorrhagic anemia 07/26/2015  . Gross hematuria 07/26/2015  . Generalized weakness 07/26/2015  . Dysphagia 07/26/2015  . Moderate malnutrition (HCC) 07/26/2015  . Failure to thrive in adult 07/26/2015  . Dementia 07/10/2015  . Hypotension 07/10/2015  . Acute CHF (congestive heart failure) (HCC) 07/10/2015  . Anasarca 07/10/2015  . Type 2 diabetes mellitus (HCC) 07/10/2015  . Aspiration pneumonia (HCC) 06/21/2015  . Pressure ulcer 06/14/2015  . Altered mental status 06/12/2015    Palliative Care Assessment & Plan   Patient Profile: 83 y.o. female  with past medical history of dementia, arthritis, gout, diabetes mellitus, hypertension, sacral decubitus wounds, and osteomyelitis admitted on 12/28/2016 with altered mental status. Recent admission for sacral decubitus wounds with osteomyelitis-followed by Dr. Sampson Goon and started on vancomycin and cefepime via PICC. In ED, found to have UTI and leukocytosis. CT scan revealed small bowel obstruction and stool retention. Surgery consulted. She is not a surgical  candidate and family expressed wishes against surgery. Considering NG tube placement. Patient to receive enemas prn and mechanical disimpaction if necessary. Palliative medicine consultation for goals of care.    Assessment: UTI  Leukocytosis Acute encephalopathy Small bowel obstruction Dementia Sacral decubitus infection with osteomyelitis Anemia  Recommendations/Plan:  DNR/DNI. Family would like to continue all other interventions.  Robinul 0.1mg  IV q4h prn  secretions  Educated on poor prognosis with risk for continued aspiration leading to respiratory decompensation. Educated on comfort focused care.  PMT will continue to support patient/family through hospitalization.   Code Status: DNR   Code Status Orders        Start     Ordered   12/28/16 1513  Do not attempt resuscitation (DNR)  Continuous    Question Answer Comment  In the event of cardiac or respiratory ARREST Do not call a "code blue"   In the event of cardiac or respiratory ARREST Do not perform Intubation, CPR, defibrillation or ACLS   In the event of cardiac or respiratory ARREST Use medication by any route, position, wound care, and other measures to relive pain and suffering. May use oxygen, suction and manual treatment of airway obstruction as needed for comfort.      12/28/16 1512    Code Status History    Date Active Date Inactive Code Status Order ID Comments User Context   11/12/2016  1:52 PM 11/20/2016  5:05 PM DNR [id number]  Katha Hamming, MD ED   11/12/2016  1:39 PM 11/12/2016  1:52 PM Full Code [id number]  Katha Hamming, MD ED   07/10/2015 10:55 PM 07/26/2015  8:43 PM DNR [id number]  Oralia Manis, MD Inpatient   06/24/2015  5:41 PM 06/26/2015  7:08 PM DNR [id number]  Suan Halter, MD Inpatient   06/21/2015  8:04 PM 06/24/2015  5:41 PM Full Code [id number]  Houston Siren, MD Inpatient   06/12/2015  8:35 PM 06/15/2015  7:56 PM Full Code [id number]  Enid Baas, MD Inpatient      Advance Directive Documentation     Most Recent Value  Type of Advance Directive  Healthcare Power of Attorney  Pre-existing out of facility DNR order (yellow form or pink MOST form)  -  "MOST" Form in Place?  -       Prognosis:  Poor prognosis    Discharge Planning:  To Be Determined high risk for hospital death  Care plan was discussed with patient, sister, daughter, two sons [name] and Jasmine Briggs), RN, and Dr. Imogene Burn  Thank you for allowing the Palliative Medicine Team to assist in the care of this patient.   Time In: 1155 Time Out: 1230 Total Time Prolonged Time Billed  no      Greater than 50%  of this time was spent counseling and coordinating care related to the above assessment and plan.  Vennie Homans, FNP-C Palliative Medicine Team  Phone: [phone] Fax: [phone]  Please contact Palliative Medicine Team phone at [phone] for questions and concerns.

## 2016-12-31 NOTE — Progress Notes (Addendum)
Advanced Care Plan.  Purpose of Encounter: Palliative care/hospice care/comfort care. Parties in Attendance:  The patient, her son and me. Patient's Decisional Capacity: No. Medical Story: [name] a 83 y.o.femalewith a known history of Hypertension, diabetes, gout, sacral DU infection with osteomyelitis and dementia. The patient was recently admitted here for sacral DU infection with osteomyelitis, has been on vancomycin and cefepime per Dr. Sampson Goon. She was found more confused and sent from home to ED. She was found UTI with leukocytosis, treated with antibiotics in the ED. CAT scan of abdomen shows small bowel obstruction as well as stool retention. Her condition is worsening. She is unresponsive, hypotension, Hb is down to 7.0. Her son refused PRBC transfusion.  I discussed with the patient's son about her condition and very poor prognosis. She may expired soon. I discussed CODE STATUS and palliative care/hospice care. Her son will talk to palliative care team. Goals of Care Determinations: Palliative care or hospice care or comfort care. Plan:  Code Status: DO NOT RESUSCITATE.  Time spent discussing advance care planning: 17 minutes.

## 2016-12-31 NOTE — Progress Notes (Addendum)
[facility] - [place] [facility]                                                                                                                                                                                  Patient Demographics   Jasmine Briggs, is a 83 y.o. female, DOB - [birth year], ZOX:[id number]  Admit date - 12/28/2016   Admitting Physician Shaune Pollack, MD  Outpatient Primary MD for the patient is Gladman, Cheral Marker, MD   LOS - 3  Subjective:  Patient is unresponsive. Hypotension and less urine.  Review of Systems:   CONSTITUTIONAL: Unable to provide due to mental status  Vitals:   Vitals:   12/30/16 1208 12/30/16 1358 12/30/16 2005 12/31/16 0426  BP: 104/80  116/79 (!) 88/55  Pulse: 70 70 87 73  Resp:   16 (!) 22  Temp:   (!) 97.5 F (36.4 C) 97.6 F (36.4 C)  TempSrc:   Oral   SpO2:  92% (!) 70% 99%  Weight:      Height:        Wt Readings from Last 3 Encounters:  12/28/16 110 lb (49.9 kg)  11/21/16 106 lb (48.1 kg)  11/20/16 106 lb 8 oz (48.3 kg)    No intake or output data in the 24 hours ending 12/31/16 1209  Physical Exam:   GENERAL:Chronically ill-appearing HEAD, EYES, EARS, NOSE AND THROAT: Atraumatic, normocephalic.  Closed eyes. NECK: Supple. There is no jugular venous distention. No bruits, no lymphadenopathy, no thyromegaly.  HEART: Regular rate and rhythm,. No murmurs, no rubs, no clicks.  LUNGS: Clear to auscultation bilaterally. No rales or rhonchi. No wheezes.  ABDOMEN: Soft, flat,Positively distended , decreased bowel sounds EXTREMITIES: arm and leg edema. NEUROLOGIC: unable to exam. SKIN: unresponsive. LN: No inguinal LN enlargement    Antibiotics   Anti-infectives    Start     Dose/Rate Route Frequency Ordered Stop   12/31/16 1000  ceFAZolin (ANCEF) IVPB 1 g/50 mL premix     1 g 100 mL/hr over 30 Minutes Intravenous Every 12 hours 12/30/16 1501     12/29/16 1600  cefTRIAXone (ROCEPHIN) 1 g in dextrose 5 % 50 mL  IVPB     1 g 100 mL/hr over 30 Minutes Intravenous Every 24 hours 12/28/16 1602 12/30/16 1532   12/29/16 1000  vancomycin (VANCOCIN) 1,250 mg in sodium chloride 0.9 % 250 mL IVPB  Status:  Discontinued     1,250 mg 166.7 mL/hr over 90 Minutes Intravenous Every 48 hours 12/28/16 1602 12/29/16 1306   12/28/16 1600  rifampin (RIFADIN) capsule 300 mg  Status:  Discontinued    Comments:  X 6 weeks  300 mg Oral Every 12 hours 12/28/16 1512 12/29/16 1944   12/28/16 1545  vancomycin (VANCOCIN) IVPB 1000 mg/200 mL premix     1,000 mg 200 mL/hr over 60 Minutes Intravenous  Once 12/28/16 1544 12/28/16 1731   12/28/16 1230  cefTRIAXone (ROCEPHIN) 1 g in dextrose 5 % 50 mL IVPB - Premix     1 g 100 mL/hr over 30 Minutes Intravenous  Once 12/28/16 1229 12/28/16 1313      Medications   Scheduled Meds: . insulin aspart  0-5 Units Subcutaneous QHS  . insulin aspart  0-9 Units Subcutaneous TID WC  . mouth rinse  15 mL Mouth Rinse q12n4p  . pantoprazole (PROTONIX) IV  40 mg Intravenous Q12H   Continuous Infusions: . 0.9 % NaCl with KCl 40 mEq / L 75 mL/hr (12/31/16 0037)  .  ceFAZolin (ANCEF) IV 1 g (12/31/16 1130)   PRN Meds:.acetaminophen **OR** acetaminophen, albuterol, bisacodyl, hydrALAZINE, ondansetron **OR** ondansetron (ZOFRAN) IV, senna-docusate   Data Review:   Micro Results Recent Results (from the past 240 hour(s))  Blood culture (routine x 2)     Status: None (Preliminary result)   Collection Time: 12/28/16  7:48 AM  Result Value Ref Range Status   Specimen Description BLOOD RIGHT FA  Final   Special Requests   Final    BOTTLES DRAWN AEROBIC AND ANAEROBIC Blood Culture adequate volume   Culture NO GROWTH 3 DAYS  Final   Report Status PENDING  Incomplete  Urine Culture     Status: Abnormal   Collection Time: 12/28/16  7:48 AM  Result Value Ref Range Status   Specimen Description URINE, CLEAN CATCH  Final   Special Requests NONE  Final   Culture >=100,000 COLONIES/mL  ESCHERICHIA COLI (A)  Final   Report Status 12/30/2016 FINAL  Final   Organism ID, Bacteria ESCHERICHIA COLI (A)  Final      Susceptibility   Escherichia coli - MIC*    AMPICILLIN 8 SENSITIVE Sensitive     CEFAZOLIN <=4 SENSITIVE Sensitive     CEFTRIAXONE <=1 SENSITIVE Sensitive     CIPROFLOXACIN <=0.25 SENSITIVE Sensitive     GENTAMICIN <=1 SENSITIVE Sensitive     IMIPENEM <=0.25 SENSITIVE Sensitive     NITROFURANTOIN <=16 SENSITIVE Sensitive     TRIMETH/SULFA <=20 SENSITIVE Sensitive     AMPICILLIN/SULBACTAM 4 SENSITIVE Sensitive     PIP/TAZO <=4 SENSITIVE Sensitive     Extended ESBL NEGATIVE Sensitive     * >=100,000 COLONIES/mL ESCHERICHIA COLI  Blood culture (routine x 2)     Status: None (Preliminary result)   Collection Time: 12/28/16 10:51 AM  Result Value Ref Range Status   Specimen Description BLOOD RIGHT HAND  Final   Special Requests   Final    BOTTLES DRAWN AEROBIC AND ANAEROBIC Blood Culture adequate volume   Culture NO GROWTH 3 DAYS  Final   Report Status PENDING  Incomplete    Radiology Reports Ct Head Wo Contrast  Result Date: 12/28/2016 CLINICAL DATA:  Encephalopathy EXAM: CT HEAD WITHOUT CONTRAST TECHNIQUE: Contiguous axial images were obtained from the base of the skull through the vertex without intravenous contrast. COMPARISON:  06/12/2015 FINDINGS: Brain: There is atrophy and chronic small vessel disease changes. No acute intracranial abnormality. Specifically, no hemorrhage, hydrocephalus, mass lesion, acute infarction, or significant intracranial injury. Vascular: No hyperdense vessel or unexpected calcification. Skull: No acute calvarial abnormality. Sinuses/Orbits: Visualized paranasal sinuses and mastoids clear. Orbital soft tissues unremarkable. Other: None IMPRESSION: No acute intracranial  abnormality. Atrophy, chronic microvascular disease. Electronically Signed   By: Charlett Nose M.D.   On: 12/28/2016 08:46   Ct Abdomen Pelvis W Contrast  Result Date:  12/28/2016 CLINICAL DATA:  Encephalopathy. Sacral decubitus ulcer. Chronic Foley with cloudy urine. EXAM: CT ABDOMEN AND PELVIS WITH CONTRAST TECHNIQUE: Multidetector CT imaging of the abdomen and pelvis was performed using the standard protocol following bolus administration of intravenous contrast. CONTRAST:  75mL ISOVUE-300 IOPAMIDOL (ISOVUE-300) INJECTION 61% COMPARISON:  None. FINDINGS: Lower chest: Partially visualized bilateral pleural effusions with compressive atelectasis in the left lower lobe and atelectasis or consolidation in the right lower lobe. Cardiomegaly. Pacemaker leads. Hepatobiliary: Multiple hypodense lesions scattered in both hepatic lobes with the largest measuring 11 mm in segment IVa and 12 mm in segment VI. These likely represent cysts though are predominantly too small to fully characterize. Small calcifications in the right hepatic lobe. Grossly unremarkable gallbladder. No biliary dilatation. Pancreas: Unremarkable. Spleen: Unremarkable. Adrenals/Urinary Tract: Unremarkable adrenal glands. Small nonobstructing calculi in both kidneys 5 mm. Low density lesion in the lower pole of the right kidney, too small to fully characterize. Bladder decompressed by a Foley catheter. Stomach/Bowel: Mild proximal distension of the stomach by fluid. Small bowel loops are fluid-filled and diffusely dilated throughout the abdomen, measuring up to approximately 4 cm in diameter. There is a transition point in the central abdomen just left of midline (series 3, image 47) with the more distal ileum being completely decompressed. A moderate amount of stool is present throughout the colon, and there is a large amount of stool within the distended rectum. The appendix is unremarkable. Vascular/Lymphatic: Abdominal aortic atherosclerosis without aneurysm. No enlarged lymph nodes. Reproductive: Status post hysterectomy. No adnexal masses. Other: Small volume ascites. No loculated fluid collection. No  pneumoperitoneum. Diffuse body wall edema. Musculoskeletal: Decubitus ulcer over the lower sacrum/coccyx with a small amount of subcutaneous gas to the left of the tip of the coccyx without drainable fluid collection. Osseous demineralization without gross focal osseous destructive changes. Advanced lower lumbar facet arthrosis with grade 2 anterolisthesis of L4 on L5. Degenerative changes of the right greater than left hips. IMPRESSION: 1. Moderate diffuse small bowel dilatation with transition to decompressed distal ileum, concerning for obstruction. 2. Moderate colonic stool burden. Large amount of rectal stool concerning for impaction. 3. Sacral decubitus ulcer.  No abscess. 4. Partially visualized bilateral pleural effusions and right greater than left lower lung atelectasis/consolidation. 5. Anasarca. 6. Bilateral nonobstructing nephrolithiasis. 7.  Aortic Atherosclerosis (ICD10-I70.0). Electronically Signed   By: Sebastian Ache M.D.   On: 12/28/2016 11:44   Dg Chest Portable 1 View  Result Date: 12/28/2016 CLINICAL DATA:  Altered mental status. EXAM: PORTABLE CHEST 1 VIEW COMPARISON:  Radiograph of November 14, 2016. FINDINGS: Stable cardiomegaly. Right-sided pacemaker is unchanged. No pneumothorax is noted. Minimal left basilar subsegmental atelectasis or scarring is noted. Improved opacity is noted in the right upper and lower lobes concerning for improved edema or infiltrate. Mild right pleural effusion remains. Stable chronic dislocation of right shoulder is noted. IMPRESSION: Decreased right lung opacity is noted concerning for improved edema or infiltrate, with mild right pleural effusion present. Electronically Signed   By: Lupita Raider, M.D.   On: 12/28/2016 09:07     CBC  Recent Labs Lab 12/28/16 0748 12/29/16 0417 12/30/16 0545 12/31/16 0540  WBC 16.8* 19.4* 15.1* 14.3*  HGB 9.3* 7.7* 8.2* 7.0*  HCT 27.7* 23.4* 25.2* 21.7*  PLT 224 175 187 151  MCV 84.7 85.3 87.1  87.7  MCH 28.4  28.2 28.5 28.4  MCHC 33.5 33.1 32.7 32.4  RDW 18.4* 19.0* 19.4* 19.5*    Chemistries   Recent Labs Lab 12/28/16 0748 12/28/16 0755 12/29/16 0417 12/30/16 0545  NA 142  --  143 143  K 2.2*  --  3.8 4.3  CL 108  --  116* 118*  CO2 26  --  24 21*  GLUCOSE 181*  --  81 86  BUN 11  --  13 14  CREATININE 0.76  --  0.64 0.73  CALCIUM 8.7*  --  7.9* 8.0*  MG  --  2.2  --   --   AST 22  --   --   --   ALT 12*  --   --   --   ALKPHOS 113  --   --   --   BILITOT 0.8  --   --   --    ------------------------------------------------------------------------------------------------------------------ estimated creatinine clearance is 34.6 mL/min (by C-G formula based on SCr of 0.73 mg/dL). ------------------------------------------------------------------------------------------------------------------ No results for input(s): HGBA1C in the last 72 hours. ------------------------------------------------------------------------------------------------------------------ No results for input(s): CHOL, HDL, LDLCALC, TRIG, CHOLHDL, LDLDIRECT in the last 72 hours. ------------------------------------------------------------------------------------------------------------------ No results for input(s): TSH, T4TOTAL, T3FREE, THYROIDAB in the last 72 hours.  Invalid input(s): FREET3 ------------------------------------------------------------------------------------------------------------------ No results for input(s): VITAMINB12, FOLATE, FERRITIN, TIBC, IRON, RETICCTPCT in the last 72 hours.  Coagulation profile No results for input(s): INR, PROTIME in the last 168 hours.  No results for input(s): DDIMER in the last 72 hours.  Cardiac Enzymes  Recent Labs Lab 12/28/16 0748  TROPONINI 0.03*   ------------------------------------------------------------------------------------------------------------------ Invalid input(s): POCBNP    Assessment & Plan  Patient is a 83 year old with  dementia  1.UTI with leukocytosis. Continue IV Rocephin for total 7 days per Dr. Sampson Goon. Appreciate ID input all other antibiotics have been discontinued  2. Acute mental encephalopathy due to above. Worsening.  3. Small bowel obstruction. Patient having bowel movements Appreciate surgical input Family is not interested in any surgery   4. bright red blood per rectum: likely related to internal hemorrhoids with patient receiving Fleet enema I stopped Lovenox continue protonix  * Anemia of chronic disease and possible due to acute blood lose. Hb down to 7. Her son refused PRBC transfusion.  Hypotension. On NS iv.  5.Sacral DU infection with osteomyelitis.antibiotics discontinued per ID  6. Dementia. Aspiration and fall precaution.  7. Diabetes. Sliding scale.  8. Hypertension. IV hydralazine when necessary.  The patient has very poor prognosis. Palliative care consult. DO NOT RESUSCITATE      Code Status Orders        Start     Ordered   12/28/16 1513  Do not attempt resuscitation (DNR)  Continuous    Question Answer Comment  In the event of cardiac or respiratory ARREST Do not call a "code blue"   In the event of cardiac or respiratory ARREST Do not perform Intubation, CPR, defibrillation or ACLS   In the event of cardiac or respiratory ARREST Use medication by any route, position, wound care, and other measures to relive pain and suffering. May use oxygen, suction and manual treatment of airway obstruction as needed for comfort.      12/28/16 1512    Code Status History    Date Active Date Inactive Code Status Order ID Comments User Context   11/12/2016  1:52 PM 11/20/2016  5:05 PM DNR [id number]  Katha Hamming, MD ED   11/12/2016  1:39  PM 11/12/2016  1:52 PM Full Code [id number]  Katha Hamming, MD ED   07/10/2015 10:55 PM 07/26/2015  8:43 PM DNR [id number]  Oralia Manis, MD Inpatient   06/24/2015  5:41 PM 06/26/2015  7:08 PM DNR [id number]  Suan Halter, MD Inpatient   06/21/2015  8:04 PM 06/24/2015  5:41 PM Full Code [id number]  Houston Siren, MD Inpatient   06/12/2015  8:35 PM 06/15/2015  7:56 PM Full Code [id number]  Enid Baas, MD Inpatient    Advance Directive Documentation     Most Recent Value  Type of Advance Directive  Healthcare Power of Attorney  Pre-existing out of facility DNR order (yellow form or pink MOST form)  -  "MOST" Form in Place?  -           Consults   Surgery  DVT Prophylaxis  Lovenox was discontinued due to GIB.  Lab Results  Component Value Date   PLT 151 12/31/2016     Time Spent in minutes  36 min  Greater than 50% of time spent in care coordination and counseling patient regarding the condition and plan of care.   Shaune Pollack M.D on 12/31/2016 at 12:09 PM  Between 7am to 6pm - Pager - [phone]  After 6pm go to [email or web address] - password [email or web address] [employer]  [facility]   Office  [phone]

## 2016-12-31 NOTE — Care Management Important Message (Signed)
Important Message  Patient Details  Name:Jasmine Briggs MRN: [id number] Date of Birth: 1921-02-23   Medicare Important Message Given:  Yes    Gwenette Greet, RN 12/31/2016, 7:17 AM

## 2017-01-01 LAB — CBC
HEMATOCRIT: 23.6 % — AB (ref 35.0–47.0)
HEMOGLOBIN: 7.7 g/dL — AB (ref 12.0–16.0)
MCH: 28.4 pg (ref 26.0–34.0)
MCHC: 32.5 g/dL (ref 32.0–36.0)
MCV: 87.6 fL (ref 80.0–100.0)
Platelets: 162 10*3/uL (ref 150–440)
RBC: 2.69 MIL/uL — AB (ref 3.80–5.20)
RDW: 20.1 % — ABNORMAL HIGH (ref 11.5–14.5)
WBC: 11.8 10*3/uL — ABNORMAL HIGH (ref 3.6–11.0)

## 2017-01-01 LAB — GLUCOSE, CAPILLARY
GLUCOSE-CAPILLARY: 73 mg/dL (ref 65–99)
GLUCOSE-CAPILLARY: 67 mg/dL (ref 65–99)
GLUCOSE-CAPILLARY: 140 mg/dL — AB (ref 65–99)
Glucose-Capillary: 77 mg/dL (ref 65–99)

## 2017-01-01 MED ORDER — DEXTROSE 50 % IV SOLN
25.0000 mL | Freq: Once | INTRAVENOUS | Status: AC
  Administered 2017-01-01: 25 mL via INTRAVENOUS
  Filled 2017-01-01: qty 50

## 2017-01-01 MED ORDER — CEPHALEXIN 250 MG/5ML PO SUSR
500.0000 mg | Freq: Two times a day (BID) | ORAL | Status: DC
  Administered 2017-01-01 – 2017-01-02 (×2): 500 mg via ORAL
  Filled 2017-01-01 (×4): qty 10

## 2017-01-01 NOTE — Progress Notes (Signed)
[facility] INFECTIOUS DISEASE PROGRESS NOTE Date of Admission:  12/28/2016     ID: Jasmine Briggs is a 83 y.o. 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 Notes reviewed  ROS  Unable to obtain  Medications:  Antibiotics Given (last 72 hours)    Date/Time Action Medication Dose Rate   12/29/16 1733 New Bag/Given   cefTRIAXone (ROCEPHIN) 1 g in dextrose 5 % 50 mL IVPB 1 g 100 mL/hr   12/30/16 1502 New Bag/Given   cefTRIAXone (ROCEPHIN) 1 g in dextrose 5 % 50 mL IVPB 1 g 100 mL/hr   12/31/16 1130 New Bag/Given   ceFAZolin (ANCEF) IVPB 1 g/50 mL premix 1 g 100 mL/hr   12/31/16 2206 New Bag/Given   ceFAZolin (ANCEF) IVPB 1 g/50 mL premix 1 g 100 mL/hr   01/01/17 1000 New Bag/Given   ceFAZolin (ANCEF) IVPB 1 g/50 mL premix 1 g 100 mL/hr     . cephALEXin  500 mg Oral Q12H  . insulin aspart  0-5 Units Subcutaneous QHS  . insulin aspart  0-9 Units Subcutaneous TID WC  . mouth rinse  15 mL Mouth Rinse q12n4p  . pantoprazole (PROTONIX) IV  40 mg Intravenous Q12H    Objective: Vital signs in last 24 hours: Temp:  [97.6 F (36.4 C)-97.9 F (36.6 C)] 97.6 F (36.4 C) (10/12 0637) Pulse Rate:  [69-73] 73 (10/12 0637) BP: (121-153)/(67-98) 153/98 (10/12 0637) SpO2:  [98 %-99 %] 99 % (10/12 1610) Constitutional:  Frail, lethargic,  HENT: [place]/AT, PERRLA, no scleral icterus Mouth/Throat: Oropharynx is clear and moist. No oropharyngeal exudate.  Cardiovascular: Normal rate, regular rhythm and normal heart sounds. Pulmonary/Chest: Rhonchi bil  Neck = supple, no nuchal rigidity Abdominal: distende, tympanic  Lymphadenopathy: no cervical adenopathy. No axillary adenopathy Neurological: alert and oriented to person, place, and  time.  Skin: wound care note Measurement: 8 cm x 10 cm x 0.3 cm Bony prominence evident.  Wound RUE:AVWU and moist 10% slough, adherent yellow Drainage  Scant serosanguinous No odor.  Periwound:scarring Wound JWJ:XBJY and moist Psychiatric: unable to assess  Lab Results  Recent Labs  12/30/16 0545 12/31/16 0540 01/01/17 0617  WBC 15.1* 14.3* 11.8*  HGB 8.2* 7.0* 7.7*  HCT 25.2* 21.7* 23.6*  NA 143  --   --   K 4.3  --   --   CL 118*  --   --   CO2 21*  --   --   BUN 14  --   --   CREATININE 0.73  --   --     Microbiology: Results for orders placed or performed during the hospital encounter of 12/28/16  Blood culture (routine x 2)     Status: None (Preliminary result)   Collection Time: 12/28/16  7:48 AM  Result Value Ref Range Status   Specimen Description BLOOD RIGHT FA  Final   Special Requests   Final    BOTTLES DRAWN AEROBIC AND ANAEROBIC Blood Culture adequate volume   Culture NO GROWTH 4 DAYS  Final   Report Status PENDING  Incomplete  Urine Culture     Status: Abnormal   Collection Time: 12/28/16  7:48 AM  Result Value Ref Range Status   Specimen Description URINE, CLEAN  CATCH  Final   Special Requests NONE  Final   Culture >=100,000 COLONIES/mL ESCHERICHIA COLI (A)  Final   Report Status 12/30/2016 FINAL  Final   Organism ID, Bacteria ESCHERICHIA COLI (A)  Final      Susceptibility   Escherichia coli - MIC*    AMPICILLIN 8 SENSITIVE Sensitive     CEFAZOLIN <=4 SENSITIVE Sensitive     CEFTRIAXONE <=1 SENSITIVE Sensitive     CIPROFLOXACIN <=0.25 SENSITIVE Sensitive     GENTAMICIN <=1 SENSITIVE Sensitive     IMIPENEM <=0.25 SENSITIVE Sensitive     NITROFURANTOIN <=16 SENSITIVE Sensitive     TRIMETH/SULFA <=20 SENSITIVE Sensitive     AMPICILLIN/SULBACTAM 4 SENSITIVE Sensitive     PIP/TAZO <=4 SENSITIVE Sensitive     Extended ESBL NEGATIVE Sensitive     * >=100,000 COLONIES/mL ESCHERICHIA COLI  Blood culture (routine x 2)     Status: None (Preliminary  result)   Collection Time: 12/28/16 10:51 AM  Result Value Ref Range Status   Specimen Description BLOOD RIGHT HAND  Final   Special Requests   Final    BOTTLES DRAWN AEROBIC AND ANAEROBIC Blood Culture adequate volume   Culture NO GROWTH 4 DAYS  Final   Report Status PENDING  Incomplete    Studies/Results: No results found.  Assessment/Plan: Jasmine Briggs is a 83 y.o. female with advanced dementia, largely bedbound, hx healing decub ulcers admitted with Small bowel obstruction. 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 Most recent otpt labs 10/1 with wbc 5.8, hgb 8.1, plt 324 and cr 0.57 crp 69. Stop date for vanco and rifampin was 10/8. Ua on this admit TNTC WBC Current admission is bowel related and not related to her PPM infection nor decub. No fevers. She is high aspiration risk but has been seen by Speech and Palliative  Recommendations Cont oral keflex for UTI - rec 7 day course then can stop abx and follow - stop date 10/14 Continue wound care locally for the sacral wound. Thank you very much for the consult. Will follow with you.  Mick Sell   01/01/2017, 2:52 PM

## 2017-01-01 NOTE — Progress Notes (Addendum)
[facility] - [place] [facility]                                                                                                                                                                                  Patient Demographics   Jasmine Briggs, is a 83 y.o. female, DOB - [birth year], ZOX:[id number]  Admit date - 12/28/2016   Admitting Physician Shaune Pollack, MD  Outpatient Primary MD for the patient is Gladman, Cheral Marker, MD   LOS - 4  Subjective:  Patient Opened eyes upon stimulation.  Review of Systems:   CONSTITUTIONAL: Unable to provide due to mental status  Vitals:   Vitals:   12/30/16 2005 12/31/16 0426 12/31/16 2052 01/01/17 0637  BP: 116/79 (!) 88/55 121/67 (!) 153/98  Pulse: 87 73 69 73  Resp: 16 (!) 22    Temp: (!) 97.5 F (36.4 C) 97.6 F (36.4 C) 97.9 F (36.6 C) 97.6 F (36.4 C)  TempSrc: Oral  Oral Axillary  SpO2: (!) 70% 99% 98% 99%  Weight:      Height:        Wt Readings from Last 3 Encounters:  12/28/16 110 lb (49.9 kg)  11/21/16 106 lb (48.1 kg)  11/20/16 106 lb 8 oz (48.3 kg)     Intake/Output Summary (Last 24 hours) at 01/01/17 1507 Last data filed at 01/01/17 1400  Gross per 24 hour  Intake             6672 ml  Output                0 ml  Net             6672 ml    Physical Exam:   GENERAL:Chronically ill-appearing HEAD, EYES, EARS, NOSE AND THROAT: Atraumatic, normocephalic.  NECK: Supple. There is no jugular venous distention. No bruits, no lymphadenopathy, no thyromegaly.  HEART: Regular rate and rhythm,. No murmurs, no rubs, no clicks.  LUNGS: Clear to auscultation bilaterally. No rales or rhonchi. No wheezes.  ABDOMEN: Soft, flat,Positively distended , decreased bowel sounds EXTREMITIES: arm and leg edema. NEUROLOGIC: unable to exam. SKIN: Edema.Marland Kitchen LN: No inguinal LN enlargement    Antibiotics   Anti-infectives    Start     Dose/Rate Route Frequency Ordered Stop   01/01/17 1500  cephALEXin (KEFLEX) 250  MG/5ML suspension 500 mg     500 mg Oral Every 12 hours 01/01/17 1345     12/31/16 1000  ceFAZolin (ANCEF) IVPB 1 g/50 mL premix  Status:  Discontinued     1 g 100 mL/hr over 30 Minutes Intravenous Every 12 hours 12/30/16 1501 01/01/17 1345   12/29/16  1600  cefTRIAXone (ROCEPHIN) 1 g in dextrose 5 % 50 mL IVPB     1 g 100 mL/hr over 30 Minutes Intravenous Every 24 hours 12/28/16 1602 12/30/16 1532   12/29/16 1000  vancomycin (VANCOCIN) 1,250 mg in sodium chloride 0.9 % 250 mL IVPB  Status:  Discontinued     1,250 mg 166.7 mL/hr over 90 Minutes Intravenous Every 48 hours 12/28/16 1602 12/29/16 1306   12/28/16 1600  rifampin (RIFADIN) capsule 300 mg  Status:  Discontinued    Comments:  X 6 weeks     300 mg Oral Every 12 hours 12/28/16 1512 12/29/16 1944   12/28/16 1545  vancomycin (VANCOCIN) IVPB 1000 mg/200 mL premix     1,000 mg 200 mL/hr over 60 Minutes Intravenous  Once 12/28/16 1544 12/28/16 1731   12/28/16 1230  cefTRIAXone (ROCEPHIN) 1 g in dextrose 5 % 50 mL IVPB - Premix     1 g 100 mL/hr over 30 Minutes Intravenous  Once 12/28/16 1229 12/28/16 1313      Medications   Scheduled Meds: . cephALEXin  500 mg Oral Q12H  . insulin aspart  0-5 Units Subcutaneous QHS  . insulin aspart  0-9 Units Subcutaneous TID WC  . mouth rinse  15 mL Mouth Rinse q12n4p  . pantoprazole (PROTONIX) IV  40 mg Intravenous Q12H   Continuous Infusions:  PRN Meds:.acetaminophen **OR** acetaminophen, albuterol, bisacodyl, glycopyrrolate, hydrALAZINE, ondansetron **OR** ondansetron (ZOFRAN) IV, senna-docusate   Data Review:   Micro Results Recent Results (from the past 240 hour(s))  Blood culture (routine x 2)     Status: None (Preliminary result)   Collection Time: 12/28/16  7:48 AM  Result Value Ref Range Status   Specimen Description BLOOD RIGHT FA  Final   Special Requests   Final    BOTTLES DRAWN AEROBIC AND ANAEROBIC Blood Culture adequate volume   Culture NO GROWTH 4 DAYS  Final    Report Status PENDING  Incomplete  Urine Culture     Status: Abnormal   Collection Time: 12/28/16  7:48 AM  Result Value Ref Range Status   Specimen Description URINE, CLEAN CATCH  Final   Special Requests NONE  Final   Culture >=100,000 COLONIES/mL ESCHERICHIA COLI (A)  Final   Report Status 12/30/2016 FINAL  Final   Organism ID, Bacteria ESCHERICHIA COLI (A)  Final      Susceptibility   Escherichia coli - MIC*    AMPICILLIN 8 SENSITIVE Sensitive     CEFAZOLIN <=4 SENSITIVE Sensitive     CEFTRIAXONE <=1 SENSITIVE Sensitive     CIPROFLOXACIN <=0.25 SENSITIVE Sensitive     GENTAMICIN <=1 SENSITIVE Sensitive     IMIPENEM <=0.25 SENSITIVE Sensitive     NITROFURANTOIN <=16 SENSITIVE Sensitive     TRIMETH/SULFA <=20 SENSITIVE Sensitive     AMPICILLIN/SULBACTAM 4 SENSITIVE Sensitive     PIP/TAZO <=4 SENSITIVE Sensitive     Extended ESBL NEGATIVE Sensitive     * >=100,000 COLONIES/mL ESCHERICHIA COLI  Blood culture (routine x 2)     Status: None (Preliminary result)   Collection Time: 12/28/16 10:51 AM  Result Value Ref Range Status   Specimen Description BLOOD RIGHT HAND  Final   Special Requests   Final    BOTTLES DRAWN AEROBIC AND ANAEROBIC Blood Culture adequate volume   Culture NO GROWTH 4 DAYS  Final   Report Status PENDING  Incomplete    Radiology Reports Ct Head Wo Contrast  Result Date: 12/28/2016 CLINICAL DATA:  Encephalopathy  EXAM: CT HEAD WITHOUT CONTRAST TECHNIQUE: Contiguous axial images were obtained from the base of the skull through the vertex without intravenous contrast. COMPARISON:  06/12/2015 FINDINGS: Brain: There is atrophy and chronic small vessel disease changes. No acute intracranial abnormality. Specifically, no hemorrhage, hydrocephalus, mass lesion, acute infarction, or significant intracranial injury. Vascular: No hyperdense vessel or unexpected calcification. Skull: No acute calvarial abnormality. Sinuses/Orbits: Visualized paranasal sinuses and mastoids  clear. Orbital soft tissues unremarkable. Other: None IMPRESSION: No acute intracranial abnormality. Atrophy, chronic microvascular disease. Electronically Signed   By: Charlett Nose M.D.   On: 12/28/2016 08:46   Ct Abdomen Pelvis W Contrast  Result Date: 12/28/2016 CLINICAL DATA:  Encephalopathy. Sacral decubitus ulcer. Chronic Foley with cloudy urine. EXAM: CT ABDOMEN AND PELVIS WITH CONTRAST TECHNIQUE: Multidetector CT imaging of the abdomen and pelvis was performed using the standard protocol following bolus administration of intravenous contrast. CONTRAST:  75mL ISOVUE-300 IOPAMIDOL (ISOVUE-300) INJECTION 61% COMPARISON:  None. FINDINGS: Lower chest: Partially visualized bilateral pleural effusions with compressive atelectasis in the left lower lobe and atelectasis or consolidation in the right lower lobe. Cardiomegaly. Pacemaker leads. Hepatobiliary: Multiple hypodense lesions scattered in both hepatic lobes with the largest measuring 11 mm in segment IVa and 12 mm in segment VI. These likely represent cysts though are predominantly too small to fully characterize. Small calcifications in the right hepatic lobe. Grossly unremarkable gallbladder. No biliary dilatation. Pancreas: Unremarkable. Spleen: Unremarkable. Adrenals/Urinary Tract: Unremarkable adrenal glands. Small nonobstructing calculi in both kidneys 5 mm. Low density lesion in the lower pole of the right kidney, too small to fully characterize. Bladder decompressed by a Foley catheter. Stomach/Bowel: Mild proximal distension of the stomach by fluid. Small bowel loops are fluid-filled and diffusely dilated throughout the abdomen, measuring up to approximately 4 cm in diameter. There is a transition point in the central abdomen just left of midline (series 3, image 47) with the more distal ileum being completely decompressed. A moderate amount of stool is present throughout the colon, and there is a large amount of stool within the distended rectum.  The appendix is unremarkable. Vascular/Lymphatic: Abdominal aortic atherosclerosis without aneurysm. No enlarged lymph nodes. Reproductive: Status post hysterectomy. No adnexal masses. Other: Small volume ascites. No loculated fluid collection. No pneumoperitoneum. Diffuse body wall edema. Musculoskeletal: Decubitus ulcer over the lower sacrum/coccyx with a small amount of subcutaneous gas to the left of the tip of the coccyx without drainable fluid collection. Osseous demineralization without gross focal osseous destructive changes. Advanced lower lumbar facet arthrosis with grade 2 anterolisthesis of L4 on L5. Degenerative changes of the right greater than left hips. IMPRESSION: 1. Moderate diffuse small bowel dilatation with transition to decompressed distal ileum, concerning for obstruction. 2. Moderate colonic stool burden. Large amount of rectal stool concerning for impaction. 3. Sacral decubitus ulcer.  No abscess. 4. Partially visualized bilateral pleural effusions and right greater than left lower lung atelectasis/consolidation. 5. Anasarca. 6. Bilateral nonobstructing nephrolithiasis. 7.  Aortic Atherosclerosis (ICD10-I70.0). Electronically Signed   By: Sebastian Ache M.D.   On: 12/28/2016 11:44   Dg Chest Portable 1 View  Result Date: 12/28/2016 CLINICAL DATA:  Altered mental status. EXAM: PORTABLE CHEST 1 VIEW COMPARISON:  Radiograph of November 14, 2016. FINDINGS: Stable cardiomegaly. Right-sided pacemaker is unchanged. No pneumothorax is noted. Minimal left basilar subsegmental atelectasis or scarring is noted. Improved opacity is noted in the right upper and lower lobes concerning for improved edema or infiltrate. Mild right pleural effusion remains. Stable chronic dislocation of right shoulder  is noted. IMPRESSION: Decreased right lung opacity is noted concerning for improved edema or infiltrate, with mild right pleural effusion present. Electronically Signed   By: Lupita Raider, M.D.   On:  12/28/2016 09:07     CBC  Recent Labs Lab 12/28/16 0748 12/29/16 0417 12/30/16 0545 12/31/16 0540 01/01/17 0617  WBC 16.8* 19.4* 15.1* 14.3* 11.8*  HGB 9.3* 7.7* 8.2* 7.0* 7.7*  HCT 27.7* 23.4* 25.2* 21.7* 23.6*  PLT 224 175 187 151 162  MCV 84.7 85.3 87.1 87.7 87.6  MCH 28.4 28.2 28.5 28.4 28.4  MCHC 33.5 33.1 32.7 32.4 32.5  RDW 18.4* 19.0* 19.4* 19.5* 20.1*    Chemistries   Recent Labs Lab 12/28/16 0748 12/28/16 0755 12/29/16 0417 12/30/16 0545  NA 142  --  143 143  K 2.2*  --  3.8 4.3  CL 108  --  116* 118*  CO2 26  --  24 21*  GLUCOSE 181*  --  81 86  BUN 11  --  13 14  CREATININE 0.76  --  0.64 0.73  CALCIUM 8.7*  --  7.9* 8.0*  MG  --  2.2  --   --   AST 22  --   --   --   ALT 12*  --   --   --   ALKPHOS 113  --   --   --   BILITOT 0.8  --   --   --    ------------------------------------------------------------------------------------------------------------------ estimated creatinine clearance is 34.6 mL/min (by C-G formula based on SCr of 0.73 mg/dL). ------------------------------------------------------------------------------------------------------------------ No results for input(s): HGBA1C in the last 72 hours. ------------------------------------------------------------------------------------------------------------------ No results for input(s): CHOL, HDL, LDLCALC, TRIG, CHOLHDL, LDLDIRECT in the last 72 hours. ------------------------------------------------------------------------------------------------------------------ No results for input(s): TSH, T4TOTAL, T3FREE, THYROIDAB in the last 72 hours.  Invalid input(s): FREET3 ------------------------------------------------------------------------------------------------------------------ No results for input(s): VITAMINB12, FOLATE, FERRITIN, TIBC, IRON, RETICCTPCT in the last 72 hours.  Coagulation profile No results for input(s): INR, PROTIME in the last 168 hours.  No results for  input(s): DDIMER in the last 72 hours.  Cardiac Enzymes  Recent Labs Lab 12/28/16 0748  TROPONINI 0.03*   ------------------------------------------------------------------------------------------------------------------ Invalid input(s): POCBNP    Assessment & Plan  Patient is a 83 year old with dementia  1.UTI with leukocytosis. Changed Rocephin to keflex for 2 more days per Dr. Sampson Goon. Appreciate ID input all other antibiotics have been discontinued  2. Acute mental encephalopathy due to above. Worsening.  3. Small bowel obstruction. Patient having bowel movements Appreciate surgical input Family is not interested in any surgery   4. bright red blood per rectum: likely related to internal hemorrhoids with patient receiving Fleet enema. stopped Lovenox continue protonix   * Anemia of chronic disease and possible due to IV fluid solution or acute blood lose. Hb down to 7. Her son refused PRBC transfusion. Repeat hemoglobin 7.7.  Hypotension. On NS iv.  5.Sacral DU infection with osteomyelitis.antibiotics discontinued per ID  6. Dementia. Aspiration and fall precaution.  7. Diabetes. Sliding scale.  8. Hypertension. IV hydralazine when necessary.  Severe malnutrition and hypoalbuminemia. The patient has very poor oral intake.  The patient has very poor prognosis. I discussed with the patient's daughter and the son about the patient's very poor prognosis. The patient's daughter and the son still do not want palliative care or hospice care. They decided to take patient home tomorrow.     Code Status Orders        Start  Ordered   12/28/16 1513  Do not attempt resuscitation (DNR)  Continuous    Question Answer Comment  In the event of cardiac or respiratory ARREST Do not call a "code blue"   In the event of cardiac or respiratory ARREST Do not perform Intubation, CPR, defibrillation or ACLS   In the event of cardiac or respiratory ARREST Use  medication by any route, position, wound care, and other measures to relive pain and suffering. May use oxygen, suction and manual treatment of airway obstruction as needed for comfort.      12/28/16 1512    Code Status History    Date Active Date Inactive Code Status Order ID Comments User Context   11/12/2016  1:52 PM 11/20/2016  5:05 PM DNR [id number]  Katha Hamming, MD ED   11/12/2016  1:39 PM 11/12/2016  1:52 PM Full Code [id number]  Katha Hamming, MD ED   07/10/2015 10:55 PM 07/26/2015  8:43 PM DNR [id number]  Oralia Manis, MD Inpatient   06/24/2015  5:41 PM 06/26/2015  7:08 PM DNR [id number]  Suan Halter, MD Inpatient   06/21/2015  8:04 PM 06/24/2015  5:41 PM Full Code [id number]  Houston Siren, MD Inpatient   06/12/2015  8:35 PM 06/15/2015  7:56 PM Full Code [id number]  Enid Baas, MD Inpatient    Advance Directive Documentation     Most Recent Value  Type of Advance Directive  Healthcare Power of Attorney  Pre-existing out of facility DNR order (yellow form or pink MOST form)  -  "MOST" Form in Place?  -           Consults   Surgery  DVT Prophylaxis  Lovenox was discontinued due to GIB.  Lab Results  Component Value Date   PLT 162 01/01/2017     Time Spent in minutes  37 min  Greater than 50% of time spent in care coordination and counseling patient regarding the condition and plan of care.   Shaune Pollack M.D on 01/01/2017 at 3:07 PM  Between 7am to 6pm - Pager - [phone]  After 6pm go to [email or web address] - password [email or web address] [facility]  [facility]   Office  [phone]

## 2017-01-01 NOTE — Plan of Care (Signed)
Problem: Skin Integrity: Goal: Risk for impaired skin integrity will decrease Outcome: Not Progressing Dressing change stage 3  Cont saline w/t.  Problem: Urinary Elimination: Goal: Signs and symptoms of infection will decrease Foley chronic intact

## 2017-01-01 NOTE — Care Management (Signed)
Nurse Baltazar Najjar states that the family of Jasmine Briggs decided that they would like to take their mother home tomorrow. Spoke with son at the bedside, Would like to resume services with [facility]. Hospital bed, bedside commode, and hoyer lift are in the home.  Would like their mother to be transport home per [facility]. Gwenette Greet RN MSN CCM Care Management [phone]

## 2017-01-02 LAB — CULTURE, BLOOD (ROUTINE X 2)
Culture: NO GROWTH
Culture: NO GROWTH
SPECIAL REQUESTS: ADEQUATE
SPECIAL REQUESTS: ADEQUATE

## 2017-01-02 LAB — GLUCOSE, CAPILLARY: GLUCOSE-CAPILLARY: 96 mg/dL (ref 65–99)

## 2017-01-02 MED ORDER — CEPHALEXIN 250 MG/5ML PO SUSR: 500.0000 mg | Freq: Two times a day (BID) | ORAL | 0 refills | Status: AC

## 2017-01-02 MED ORDER — HEPARIN SOD (PORK) LOCK FLUSH 100 UNIT/ML IV SOLN
250.0000 [IU] | INTRAVENOUS | Status: AC | PRN
  Administered 2017-01-02: 250 [IU]
  Filled 2017-01-02: qty 5

## 2017-01-02 MED ORDER — SODIUM CHLORIDE 0.9% FLUSH
10.0000 mL | INTRAVENOUS | Status: AC | PRN
  Administered 2017-01-02: 10 mL

## 2017-01-02 NOTE — Discharge Summary (Signed)
[facility] [place] at [facility]   PATIENT NAME: Jasmine Briggs    MR#:  [id number]  DATE OF BIRTH:  1921/01/28  DATE OF ADMISSION:  12/28/2016   ADMITTING PHYSICIAN: Shaune Pollack, MD  DATE OF DISCHARGE: 01/02/2017 11:30 AM  PRIMARY CARE PHYSICIAN: Kurtis Bushman, MD   ADMISSION DIAGNOSIS:  SBO (small bowel obstruction) (HCC) [K56.609] Urinary tract infection without hematuria, site unspecified [N39.0] Altered mental status, unspecified altered mental status type [R41.82] DISCHARGE DIAGNOSIS:  Active Problems:   UTI (urinary tract infection)   SBO (small bowel obstruction) (HCC)   Palliative care by specialist   Increased oropharyngeal secretions   At high risk for aspiration  SECONDARY DIAGNOSIS:   Past Medical History:  Diagnosis Date  . Arthritis   . Dementia   . Diabetes mellitus without complication (HCC)   . Gout   . Hypertension   . Osteomyelitis (HCC)    sacral wound   HOSPITAL COURSE:  Patient is a 83 year old with dementia  1.UTI with leukocytosis. Changed Rocephin to keflex for 2 more days per Dr. Sampson Goon. Appreciate ID input all other antibiotics was discontinued  2. Acute mental encephalopathy due to above.   3. Small bowel obstruction. Patient having bowel movements Appreciate surgical input Family is not interested in any surgery   4. bright red blood per rectum: likely related to internal hemorrhoids with patient receiving Fleet enema. stopped Lovenox continue protonix   * Anemia of chronic disease and possible due to IV fluid solution or acute blood lose. Hb down to 7. Her son refused PRBC transfusion. Repeat hemoglobin 7.7.  Hypotension. Improved.  5.Sacral DU infection with osteomyelitis.antibiotics discontinued per ID  6. Dementia. Aspiration and fall precaution.  7. Diabetes. Sliding scale.  8. Hypertension. IV hydralazine when necessary.  Severe malnutrition and hypoalbuminemia. The  patient has very poor oral intake.  The patient has very poor prognosis. I discussed with the patient's daughter and the son about the patient's very poor prognosis. The patient's daughter and the son still do not want palliative care or hospice care. They decided to take patient home today.  DISCHARGE CONDITIONS:  The patient is discharged to home with home health. CONSULTS OBTAINED:  Treatment Team:  Ancil Linsey, MD Mick Sell, MD DRUG ALLERGIES:  No Known Allergies DISCHARGE MEDICATIONS:   Allergies as of 01/02/2017   No Known Allergies     Medication List    STOP taking these medications   nicotine 7 mg/24hr patch Commonly known as:  NICODERM CQ - dosed in mg/24 hr   ondansetron 4 MG tablet Commonly known as:  ZOFRAN   potassium chloride 20 MEQ/15ML (10%) Soln   rifampin 300 MG capsule Commonly known as:  RIFADIN   vancomycin IVPB     TAKE these medications   acetaminophen 500 MG tablet Commonly known as:  TYLENOL Take 500 mg by mouth every 6 (six) hours as needed for mild pain.   cephALEXin 250 MG/5ML suspension Commonly known as:  KEFLEX Take 10 mLs (500 mg total) by mouth every 12 (twelve) hours.   ipratropium-albuterol 0.5-2.5 (3) MG/3ML Soln Commonly known as:  DUONEB Take 3 mLs by nebulization every 4 (four) hours as needed.        DISCHARGE INSTRUCTIONS:  See AVS.  If you experience worsening of your admission symptoms, develop shortness of breath, life threatening emergency, suicidal or homicidal thoughts you must seek medical attention immediately by calling 911 or calling your MD immediately  if symptoms less severe.  You Must read complete instructions/literature along with all the possible adverse reactions/side effects for all the Medicines you take and that have been prescribed to you. Take any new Medicines after you have completely understood and accpet all the possible adverse reactions/side effects.   Please  note  You were cared for by a hospitalist during your hospital stay. If you have any questions about your discharge medications or the care you received while you were in the hospital after you are discharged, you can call the unit and asked to speak with the hospitalist on call if the hospitalist that took care of you is not available. Once you are discharged, your primary care physician will handle any further medical issues. Please note that NO REFILLS for any discharge medications will be authorized once you are discharged, as it is imperative that you return to your primary care physician (or establish a relationship with a primary care physician if you do not have one) for your aftercare needs so that they can reassess your need for medications and monitor your lab values.    On the day of Discharge:  VITAL SIGNS:  Blood pressure (!) 122/57, pulse 70, temperature 98.1 F (36.7 C), resp. rate 16, height  (1.626 m), weight 110 lb (49.9 kg), SpO2 96 %. PHYSICAL EXAMINATION:  GENERAL:  83 y.o.-year-old patient lying in the bed with no acute distress. Severe malnutrition. EYES: Pupils equal, round, reactive to light and accommodation. No scleral icterus. Extraocular muscles intact.  HEENT: Head atraumatic, normocephalic.  NECK:  Supple, no jugular venous distention. No thyroid enlargement, no tenderness.  LUNGS: Normal breath sounds bilaterally, no wheezing, rales,rhonchi or crepitation. No use of accessory muscles of respiration.  CARDIOVASCULAR: S1, S2 normal. 3/6 systolic murmurs, no rubs, or gallops.  ABDOMEN: Soft, non-tender, non-distended. Bowel sounds present. EXTREMITIES: No cyanosis, or clubbing. Edema on arms and legs. NEUROLOGIC: Unable to exam.  PSYCHIATRIC: The patient is demented. SKIN: No obvious rash, lesion, or ulcer.  DATA REVIEW:   CBC  Recent Labs Lab 01/01/17 0617  WBC 11.8*  HGB 7.7*  HCT 23.6*  PLT 162    Chemistries   Recent Labs Lab 12/28/16 0748  12/28/16 0755  12/30/16 0545  NA 142  --   < > 143  K 2.2*  --   < > 4.3  CL 108  --   < > 118*  CO2 26  --   < > 21*  GLUCOSE 181*  --   < > 86  BUN 11  --   < > 14  CREATININE 0.76  --   < > 0.73  CALCIUM 8.7*  --   < > 8.0*  MG  --  2.2  --   --   AST 22  --   --   --   ALT 12*  --   --   --   ALKPHOS 113  --   --   --   BILITOT 0.8  --   --   --   < > = values in this interval not displayed.   Microbiology Results  Results for orders placed or performed during the hospital encounter of 12/28/16  Blood culture (routine x 2)     Status: None   Collection Time: 12/28/16  7:48 AM  Result Value Ref Range Status   Specimen Description BLOOD RIGHT FA  Final   Special Requests   Final    BOTTLES DRAWN AEROBIC  AND ANAEROBIC Blood Culture adequate volume   Culture NO GROWTH 5 DAYS  Final   Report Status 01/02/2017 FINAL  Final  Urine Culture     Status: Abnormal   Collection Time: 12/28/16  7:48 AM  Result Value Ref Range Status   Specimen Description URINE, CLEAN CATCH  Final   Special Requests NONE  Final   Culture >=100,000 COLONIES/mL ESCHERICHIA COLI (A)  Final   Report Status 12/30/2016 FINAL  Final   Organism ID, Bacteria ESCHERICHIA COLI (A)  Final      Susceptibility   Escherichia coli - MIC*    AMPICILLIN 8 SENSITIVE Sensitive     CEFAZOLIN <=4 SENSITIVE Sensitive     CEFTRIAXONE <=1 SENSITIVE Sensitive     CIPROFLOXACIN <=0.25 SENSITIVE Sensitive     GENTAMICIN <=1 SENSITIVE Sensitive     IMIPENEM <=0.25 SENSITIVE Sensitive     NITROFURANTOIN <=16 SENSITIVE Sensitive     TRIMETH/SULFA <=20 SENSITIVE Sensitive     AMPICILLIN/SULBACTAM 4 SENSITIVE Sensitive     PIP/TAZO <=4 SENSITIVE Sensitive     Extended ESBL NEGATIVE Sensitive     * >=100,000 COLONIES/mL ESCHERICHIA COLI  Blood culture (routine x 2)     Status: None   Collection Time: 12/28/16 10:51 AM  Result Value Ref Range Status   Specimen Description BLOOD RIGHT HAND  Final   Special Requests   Final      BOTTLES DRAWN AEROBIC AND ANAEROBIC Blood Culture adequate volume   Culture NO GROWTH 5 DAYS  Final   Report Status 01/02/2017 FINAL  Final    RADIOLOGY:  No results found.   Management plans discussed with the patient, family and they are in agreement.  CODE STATUS: Prior   TOTAL TIME TAKING CARE OF THIS PATIENT: 32 minutes.    Shaune Pollack M.D on 01/02/2017 at 5:08 PM  Between 7am to 6pm - Pager - [phone]  After 6pm go to [email or web address] - Social research officer, government  [facility]  Office  [phone]  CC: Primary care physician; Kurtis Bushman, MD   Note: This dictation was prepared with Dragon dictation along with smaller phrase technology. Any transcriptional errors that result from this process are unintentional.

## 2017-01-02 NOTE — Care Management Note (Signed)
Case Management Note  Patient Details  Name: Jasmine Briggs MRN: [id number] Date of Birth: 12-Jun-1920  Subjective/Objective:    Call to Jerilynn Som at [facility] to resume HH=PT, RN, Aide, Speech, OT. Paperwork completed for EMS transport to home.                 Action/Plan:   Expected Discharge Date:  01/02/17               Expected Discharge Plan:  Home w Home Health Services  In-House Referral:  NA  Discharge planning Services  CM Consult  Post Acute Care Choice:  Home Health Choice offered to:  Patient, Adult Children  DME Arranged:  N/A DME Agency:  NA  HH Arranged:  RN, PT, OT, Speech Therapy, Nurse's Aide HH Agency:  [facility]  Status of Service:  Completed, signed off  If discussed at Long Length of Stay Meetings, dates discussed:    Additional Comments:  Kolbee Stallman A, RN 01/02/2017, 8:47 AM

## 2017-01-02 NOTE — Progress Notes (Signed)
[facility] here to transport pt home. Discharge home to care of family with [facility] service support.

## 2017-01-02 NOTE — Discharge Instructions (Signed)
Antibiotic Medicine, Adult Antibiotic medicines treat infections caused by a type of germ called bacteria. They work by killing the bacteria that make you sick. When do I need to take antibiotics? You often need these medicines to treat bacterial infections, such as:  A urinary tract infection (UTI).  Strep throat.  Meningitis. This affects the spinal cord and brain.  A bad lung infection.  You may start the medicines while your doctor waits for tests to come back. When the tests come back, your doctor may change or stop your medicine. When are antibiotics not needed? You do not need these medicines for most common illnesses, such as:  A cold.  The flu.  A sore throat.  Antibiotics are not always needed for all infections caused by bacteria. Do not ask for these medicines, or take them, when they are not needed. What are the risks of taking antibiotics? Most antibiotics can cause an infection called Clostridium difficile.This causes watery poop (diarrhea). Let your doctor know right away if:  You have watery poop while taking an antibiotic.  You have watery poop after you stop taking an antibiotic. The illness can happen weeks after you stop the medicine.  You also have a risk of getting an infection in the future that antibiotics cannot treat (antibiotic-resistant infection). This type of infection can be dangerous. What else should I know about taking antibiotics?  You need to take the entire prescription. ? Take the medicine for as long as told by your doctor. ? Do not stop taking it even if you start to feel better.  Try not to miss any doses. If you miss a dose, call your doctor.  Birth control pills may not work. If you take birth control pills: ? Keep on taking them. ? Use a second form of birth control, such as a condom. Do this for as long as told by your doctor.  Ask your doctor: ? How long to wait in between doses. ? If you should take the medicine with  food. ? If there is anything you should stay away from while taking the antibiotic, such as: ? Food. ? Drinks. ? Medicines. ? If there are any side effects you should watch for.  Only take the medicines that your doctor told you to take. Do not take medicines that were given to someone else.  Drink a large glass of water with the medicine.  Ask the pharmacist for a tool to measure the medicine, such as: ? A syringe. ? A cup. ? A spoon.  Throw away any extra medicine. Contact a doctor if:  You get worse.  You have new joint pain or muscle aches after starting the medicine.  You have side effects from the medicine, such as: ? Stomach pain. ? Watery poop. ? Feeling sick to your stomach (nausea). Get help right away if:  You have signs of a very bad allergic reaction. If this happens, stop taking the medicine right away. Signs may include: ? Hives. These are raised, itchy, red bumps on the skin. ? Skin rash. ? Trouble breathing. ? Wheezing. ? Swelling. ? Feeling dizzy. ? Throwing up (vomiting).  Your pee (urine) is [place], or is the color of blood.  Your skin turns yellow.  You bruise easily.  You bleed easily.  You have very bad watery poop and cramps in your belly.  You have a very bad headache. Summary  Antibiotics are often used to treat infections caused by bacteria.  Only take  these medicines when needed.  Let your doctor know if you have watery poop while taking an antibiotic.  You need to take the entire prescription. This information is not intended to replace advice given to you by your health care provider. Make sure you discuss any questions you have with your health care provider. Document Released: 12/17/2007 Document Revised: 03/11/2016 Document Reviewed: 03/11/2016 Elsevier Interactive Patient Education  2017 Elsevier Inc. Dysphagia diet 1 Aspiration precaution. Home healthy

## 2017-01-02 NOTE — Progress Notes (Signed)
Pt is aware/alert at intervals. Family at bedside feeding pt slowly/small amounts. Wet sound to expiration with respirations without distress. Family desires to take pt home for End of Life care. Oral and written AVS instructions, yellow DNR form placed in discharge packet. RX for keflex given to dgt and advised to pick up at pharmacy of choice. Care management referral completed. Pt dressed for transport home. EMS called for routine transport.

## 2017-01-04 ENCOUNTER — Emergency Department: Payer: Medicare Other

## 2017-01-04 ENCOUNTER — Inpatient Hospital Stay
Admission: EM | Admit: 2017-01-04 | Discharge: 2017-01-21 | DRG: 871 | Disposition: E | Payer: Medicare Other | Attending: Internal Medicine | Admitting: Internal Medicine

## 2017-01-04 DIAGNOSIS — R0682 Tachypnea, not elsewhere classified: Secondary | ICD-10-CM | POA: Diagnosis not present

## 2017-01-04 DIAGNOSIS — I10 Essential (primary) hypertension: Secondary | ICD-10-CM | POA: Diagnosis present

## 2017-01-04 DIAGNOSIS — Z66 Do not resuscitate: Secondary | ICD-10-CM | POA: Diagnosis present

## 2017-01-04 DIAGNOSIS — L89154 Pressure ulcer of sacral region, stage 4: Secondary | ICD-10-CM | POA: Diagnosis present

## 2017-01-04 DIAGNOSIS — D638 Anemia in other chronic diseases classified elsewhere: Secondary | ICD-10-CM | POA: Diagnosis present

## 2017-01-04 DIAGNOSIS — Z950 Presence of cardiac pacemaker: Secondary | ICD-10-CM

## 2017-01-04 DIAGNOSIS — F0390 Unspecified dementia without behavioral disturbance: Secondary | ICD-10-CM | POA: Diagnosis present

## 2017-01-04 DIAGNOSIS — N179 Acute kidney failure, unspecified: Secondary | ICD-10-CM | POA: Diagnosis present

## 2017-01-04 DIAGNOSIS — N289 Disorder of kidney and ureter, unspecified: Secondary | ICD-10-CM

## 2017-01-04 DIAGNOSIS — J690 Pneumonitis due to inhalation of food and vomit: Secondary | ICD-10-CM | POA: Diagnosis present

## 2017-01-04 DIAGNOSIS — E870 Hyperosmolality and hypernatremia: Secondary | ICD-10-CM | POA: Diagnosis present

## 2017-01-04 DIAGNOSIS — A419 Sepsis, unspecified organism: Secondary | ICD-10-CM | POA: Diagnosis not present

## 2017-01-04 DIAGNOSIS — Z87891 Personal history of nicotine dependence: Secondary | ICD-10-CM | POA: Diagnosis not present

## 2017-01-04 DIAGNOSIS — E119 Type 2 diabetes mellitus without complications: Secondary | ICD-10-CM | POA: Diagnosis present

## 2017-01-04 DIAGNOSIS — R0602 Shortness of breath: Secondary | ICD-10-CM | POA: Diagnosis present

## 2017-01-04 DIAGNOSIS — Z833 Family history of diabetes mellitus: Secondary | ICD-10-CM | POA: Diagnosis not present

## 2017-01-04 DIAGNOSIS — J189 Pneumonia, unspecified organism: Secondary | ICD-10-CM | POA: Diagnosis not present

## 2017-01-04 DIAGNOSIS — Z7401 Bed confinement status: Secondary | ICD-10-CM | POA: Diagnosis not present

## 2017-01-04 DIAGNOSIS — R0902 Hypoxemia: Secondary | ICD-10-CM | POA: Diagnosis not present

## 2017-01-04 DIAGNOSIS — N3000 Acute cystitis without hematuria: Secondary | ICD-10-CM

## 2017-01-04 DIAGNOSIS — K5641 Fecal impaction: Secondary | ICD-10-CM

## 2017-01-04 DIAGNOSIS — Z7189 Other specified counseling: Secondary | ICD-10-CM | POA: Diagnosis not present

## 2017-01-04 DIAGNOSIS — R6520 Severe sepsis without septic shock: Secondary | ICD-10-CM | POA: Diagnosis present

## 2017-01-04 DIAGNOSIS — J9601 Acute respiratory failure with hypoxia: Secondary | ICD-10-CM | POA: Diagnosis present

## 2017-01-04 DIAGNOSIS — R4182 Altered mental status, unspecified: Secondary | ICD-10-CM | POA: Diagnosis not present

## 2017-01-04 DIAGNOSIS — Z515 Encounter for palliative care: Secondary | ICD-10-CM | POA: Diagnosis not present

## 2017-01-04 LAB — TROPONIN I: Troponin I: 0.03 ng/mL (ref <0.03)

## 2017-01-04 LAB — CBC WITH DIFFERENTIAL/PLATELET
BASOS PCT: 0 %
Basophils Absolute: 0 10*3/uL (ref 0–0.1)
Eosinophils Absolute: 0 10*3/uL (ref 0–0.7)
Eosinophils Relative: 0 %
HEMATOCRIT: 22.5 % — AB (ref 35.0–47.0)
HEMOGLOBIN: 7.1 g/dL — AB (ref 12.0–16.0)
LYMPHS ABS: 0.4 10*3/uL — AB (ref 1.0–3.6)
Lymphocytes Relative: 3 %
MCH: 28.2 pg (ref 26.0–34.0)
MCHC: 31.7 g/dL — AB (ref 32.0–36.0)
MCV: 89 fL (ref 80.0–100.0)
MONOS PCT: 7 %
Monocytes Absolute: 0.8 10*3/uL (ref 0.2–0.9)
NEUTROS ABS: 11.2 10*3/uL — AB (ref 1.4–6.5)
NEUTROS PCT: 90 %
Platelets: 130 10*3/uL — ABNORMAL LOW (ref 150–440)
RBC: 2.53 MIL/uL — ABNORMAL LOW (ref 3.80–5.20)
RDW: 20.7 % — ABNORMAL HIGH (ref 11.5–14.5)
WBC: 12.5 10*3/uL — AB (ref 3.6–11.0)

## 2017-01-04 LAB — URINALYSIS, ROUTINE W REFLEX MICROSCOPIC
BACTERIA UA: NONE SEEN
BILIRUBIN URINE: NEGATIVE
GLUCOSE, UA: NEGATIVE mg/dL
Ketones, ur: 5 mg/dL — AB
Nitrite: NEGATIVE
Protein, ur: 100 mg/dL — AB
SPECIFIC GRAVITY, URINE: 1.028 (ref 1.005–1.030)
pH: 5 (ref 5.0–8.0)

## 2017-01-04 LAB — COMPREHENSIVE METABOLIC PANEL
ALBUMIN: 1.8 g/dL — AB (ref 3.5–5.0)
ALK PHOS: 89 U/L (ref 38–126)
ALT: <5 U/L — ABNORMAL LOW (ref 14–54)
ANION GAP: 9 (ref 5–15)
AST: 24 U/L (ref 15–41)
BILIRUBIN TOTAL: 1 mg/dL (ref 0.3–1.2)
BUN: 16 mg/dL (ref 6–20)
CALCIUM: 8.6 mg/dL — AB (ref 8.9–10.3)
CO2: 16 mmol/L — AB (ref 22–32)
CREATININE: 1.03 mg/dL — AB (ref 0.44–1.00)
Chloride: 129 mmol/L — ABNORMAL HIGH (ref 101–111)
GFR calc Af Amer: 53 mL/min — ABNORMAL LOW (ref >60)
GFR calc non Af Amer: 45 mL/min — ABNORMAL LOW (ref >60)
GLUCOSE: 109 mg/dL — AB (ref 65–99)
Potassium: 3.7 mmol/L (ref 3.5–5.1)
Sodium: 154 mmol/L — ABNORMAL HIGH (ref 135–145)
TOTAL PROTEIN: 5.2 g/dL — AB (ref 6.5–8.1)

## 2017-01-04 LAB — BLOOD GAS, VENOUS
ACID-BASE DEFICIT: 7.2 mmol/L — AB (ref 0.0–2.0)
BICARBONATE: 17.3 mmol/L — AB (ref 20.0–28.0)
PATIENT TEMPERATURE: 37
pCO2, Ven: 30 mmHg — ABNORMAL LOW (ref 44.0–60.0)
pH, Ven: 7.37 (ref 7.250–7.430)
pO2, Ven: <31 mmHg — CL (ref 32.0–45.0)

## 2017-01-04 LAB — LACTIC ACID, PLASMA: LACTIC ACID, VENOUS: 1.6 mmol/L (ref 0.5–1.9)

## 2017-01-04 LAB — EXPECTORATED SPUTUM ASSESSMENT W GRAM STAIN, RFLX TO RESP C

## 2017-01-04 LAB — BRAIN NATRIURETIC PEPTIDE: B Natriuretic Peptide: 423 pg/mL — ABNORMAL HIGH (ref 0.0–100.0)

## 2017-01-04 LAB — EXPECTORATED SPUTUM ASSESSMENT W REFEX TO RESP CULTURE

## 2017-01-04 MED ORDER — SODIUM CHLORIDE 0.9 % IV BOLUS (SEPSIS)
1000.0000 mL | Freq: Once | INTRAVENOUS | Status: AC
  Administered 2017-01-04: 1000 mL via INTRAVENOUS

## 2017-01-04 MED ORDER — PIPERACILLIN-TAZOBACTAM 3.375 G IVPB 30 MIN
3.3750 g | Freq: Once | INTRAVENOUS | Status: AC
  Administered 2017-01-04: 3.375 g via INTRAVENOUS

## 2017-01-04 MED ORDER — PIPERACILLIN-TAZOBACTAM 3.375 G IVPB 30 MIN: 3.3750 g | Freq: Once | INTRAVENOUS | Status: DC

## 2017-01-04 MED ORDER — VANCOMYCIN HCL IN DEXTROSE 1-5 GM/200ML-% IV SOLN
1000.0000 mg | INTRAVENOUS | Status: DC
  Filled 2017-01-04: qty 200

## 2017-01-04 MED ORDER — SODIUM CHLORIDE 0.9 % IV BOLUS (SEPSIS): 250.0000 mL | Freq: Once | INTRAVENOUS | Status: DC

## 2017-01-04 MED ORDER — SODIUM CHLORIDE 0.9 % IV SOLN
INTRAVENOUS | Status: DC
  Administered 2017-01-04: 20:00:00 via INTRAVENOUS

## 2017-01-04 MED ORDER — HEPARIN SODIUM (PORCINE) 5000 UNIT/ML IJ SOLN
5000.0000 [IU] | Freq: Three times a day (TID) | INTRAMUSCULAR | Status: DC
  Administered 2017-01-04 – 2017-01-07 (×8): 5000 [IU] via SUBCUTANEOUS
  Filled 2017-01-04 (×8): qty 1

## 2017-01-04 MED ORDER — IPRATROPIUM-ALBUTEROL 0.5-2.5 (3) MG/3ML IN SOLN: 3.0000 mL | RESPIRATORY_TRACT | Status: DC | PRN

## 2017-01-04 MED ORDER — DOCUSATE SODIUM 100 MG PO CAPS: 100.0000 mg | ORAL_CAPSULE | Freq: Two times a day (BID) | ORAL | Status: DC | PRN

## 2017-01-04 MED ORDER — VANCOMYCIN HCL IN DEXTROSE 1-5 GM/200ML-% IV SOLN: 1000.0000 mg | INTRAVENOUS | Status: DC

## 2017-01-04 MED ORDER — PIPERACILLIN-TAZOBACTAM 3.375 G IVPB 30 MIN
INTRAVENOUS | Status: AC
  Filled 2017-01-04: qty 50

## 2017-01-04 MED ORDER — PIPERACILLIN-TAZOBACTAM 3.375 G IVPB
3.3750 g | Freq: Three times a day (TID) | INTRAVENOUS | Status: DC
Start: 2017-01-04 — End: 2017-01-09
  Administered 2017-01-04 – 2017-01-09 (×14): 3.375 g via INTRAVENOUS
  Filled 2017-01-04 (×16): qty 50

## 2017-01-04 MED ORDER — VANCOMYCIN HCL 10 G IV SOLR: 1000.0000 mg | Freq: Once | INTRAVENOUS | Status: DC

## 2017-01-04 MED ORDER — SODIUM CHLORIDE 0.9 % IV BOLUS (SEPSIS): 500.0000 mL | Freq: Once | INTRAVENOUS | Status: DC

## 2017-01-04 MED ORDER — VANCOMYCIN HCL IN DEXTROSE 1-5 GM/200ML-% IV SOLN
1000.0000 mg | Freq: Once | INTRAVENOUS | Status: AC
  Administered 2017-01-04: 1000 mg via INTRAVENOUS
  Filled 2017-01-04: qty 200

## 2017-01-04 NOTE — ED Notes (Signed)
Report given to Kala RN. 

## 2017-01-04 NOTE — ED Notes (Signed)
CODE  SEPSIS  CALLED  TO  CARELINK 

## 2017-01-04 NOTE — ED Notes (Signed)
Date and time results received: January 09, 2017 1651 (use smartphrase ".now" to insert current time)  Test: Troponin Critical Value: 0.03  Name of Provider Notified: Dr. Sharma Covert  Orders Received? Or Actions Taken?:

## 2017-01-04 NOTE — H&P (Signed)
[facility] [place] at [facility]   PATIENT NAME: Jasmine Briggs    MR#:  [id number]  DATE OF BIRTH:  1921-02-04  DATE OF ADMISSION:  01/05/2017  PRIMARY CARE PHYSICIAN: Kurtis Bushman, MD   REQUESTING/REFERRING PHYSICIAN: Sharma Covert  CHIEF COMPLAINT:   Chief Complaint  Patient presents with  . Shortness of Breath    HISTORY OF PRESENT ILLNESS: Jasmine Briggs  is a 83 y.o. female with a known history of dementia, DM, Htn, Osteomyelitis on Sacral wound- Was here with UTI- sent home woth oral Abx as advised by ID. Had catheter. At baseline, bed bound, Eats puree diet. FOr last 2 days stays drowsy, son tried to feed. Today visiting nurse came, noted O2 sats low, so advised to take to ER. ER physician spoke about palliative care, son is not ready for that.  PAST MEDICAL HISTORY:   Past Medical History:  Diagnosis Date  . Arthritis   . Dementia   . Diabetes mellitus without complication (HCC)   . Gout   . Hypertension   . Osteomyelitis (HCC)    sacral wound    PAST SURGICAL HISTORY: Past Surgical History:  Procedure Laterality Date  . CARDIAC SURGERY    . IR FLUORO GUIDE CV LINE LEFT  11/19/2016  . PACEMAKER INSERTION    . PICC line placement      SOCIAL HISTORY:  Social History  Substance Use Topics  . Smoking status: Never Smoker  . Smokeless tobacco: Former Neurosurgeon    Types: Snuff  . Alcohol use No    FAMILY HISTORY:  Family History  Problem Relation Age of Onset  . Diabetes Maternal Aunt     DRUG ALLERGIES: No Known Allergies  REVIEW OF SYSTEMS:   Due to dementia, pt  Is not able to give details.  MEDICATIONS AT HOME:  Prior to Admission medications   Medication Sig Start Date End Date Taking? Authorizing Provider  cephALEXin (KEFLEX) 250 MG/5ML suspension Take 10 mLs (500 mg total) by mouth every 12 (twelve) hours. 01/02/17  Yes Shaune Pollack, MD  acetaminophen (TYLENOL) 500 MG tablet Take 500 mg by mouth every 6 (six) hours as needed for mild  pain.     [provider]  ipratropium-albuterol (DUONEB) 0.5-2.5 (3) MG/3ML SOLN Take 3 mLs by nebulization every 4 (four) hours as needed. 11/21/16   Willy Eddy, MD      PHYSICAL EXAMINATION:   VITAL SIGNS: Blood pressure (!) 153/136, pulse 69, temperature 98.4 F (36.9 C), temperature source Oral, resp. rate 18, height  (1.6 m), weight 54.4 kg (120 lb), SpO2 98 %.  GENERAL:  83 y.o.-year-old thin patient lying in the bed with no acute distress. Drowsy. EYES: Pupils equal, round, reactive to light and accommodation. No scleral icterus. Extraocular muscles intact.  HEENT: Head atraumatic, normocephalic. Oropharynx and nasopharynx clear.  NECK:  Supple, no jugular venous distention. No thyroid enlargement, no tenderness.  LUNGS: Normal breath sounds bilaterally, no wheezing, some crepitation. No use of accessory muscles of respiration.  CARDIOVASCULAR: S1, S2 normal. No murmurs, rubs, or gallops.  ABDOMEN: Soft, nontender, nondistended. Bowel sounds present. No organomegaly or mass. Foley cath in place. EXTREMITIES: No pedal edema, cyanosis, or clubbing. Atrophic limbs,. NEUROLOGIC: pt is drowsy, opens eyes to stimuli, but does not talk or follow commands. PSYCHIATRIC: The patient drowsy.  SKIN: No obvious rash, lesion, or ulcer.   LABORATORY PANEL:   CBC  Recent Labs Lab 12/29/16 0417 12/30/16 0545 12/31/16 0540 01/01/17 0617  01/06/2017 1539  WBC 19.4* 15.1* 14.3* 11.8* 12.5*  HGB 7.7* 8.2* 7.0* 7.7* 7.1*  HCT 23.4* 25.2* 21.7* 23.6* 22.5*  PLT 175 187 151 162 130*  MCV 85.3 87.1 87.7 87.6 89.0  MCH 28.2 28.5 28.4 28.4 28.2  MCHC 33.1 32.7 32.4 32.5 31.7*  RDW 19.0* 19.4* 19.5* 20.1* 20.7*  LYMPHSABS  --   --   --   --  0.4*  MONOABS  --   --   --   --  0.8  EOSABS  --   --   --   --  0.0  BASOSABS  --   --   --   --  0.0   ------------------------------------------------------------------------------------------------------------------  Chemistries    Recent Labs Lab 12/29/16 0417 12/30/16 0545 01/19/2017 1539  NA 143 143 154*  K 3.8 4.3 3.7  CL 116* 118* 129*  CO2 24 21* 16*  GLUCOSE 81 86 109*  BUN CREATININE 0.64 0.73 1.03*  CALCIUM 7.9* 8.0* 8.6*  AST  --   --  24  ALT  --   --  <5*  ALKPHOS  --   --  89  BILITOT  --   --  1.0   ------------------------------------------------------------------------------------------------------------------ estimated creatinine clearance is 28.2 mL/min (A) (by C-G formula based on SCr of 1.03 mg/dL (H)). ------------------------------------------------------------------------------------------------------------------ No results for input(s): TSH, T4TOTAL, T3FREE, THYROIDAB in the last 72 hours.  Invalid input(s): FREET3   Coagulation profile No results for input(s): INR, PROTIME in the last 168 hours. ------------------------------------------------------------------------------------------------------------------- No results for input(s): DDIMER in the last 72 hours. -------------------------------------------------------------------------------------------------------------------  Cardiac Enzymes  Recent Labs Lab 12/22/2016 1539  TROPONINI 0.03*   ------------------------------------------------------------------------------------------------------------------ Invalid input(s): POCBNP  ---------------------------------------------------------------------------------------------------------------  Urinalysis    Component Value Date/Time   COLORURINE AMBER (A) 12/25/2016 1555   APPEARANCEUR TURBID (A) 01/07/2017 1555   APPEARANCEUR Hazy 10/31/2013 1709   LABSPEC 1.028 12/21/2016 1555   LABSPEC 1.012 10/31/2013 1709   PHURINE 5.0 01/06/2017 1555   GLUCOSEU NEGATIVE 01/09/2017 1555   GLUCOSEU Negative 10/31/2013 1709   HGBUR MODERATE (A) 12/29/2016 1555   BILIRUBINUR NEGATIVE 01/17/2017 1555   BILIRUBINUR Negative 10/31/2013 1709   KETONESUR 5 (A) 12/27/2016  1555   PROTEINUR 100 (A) 01/20/2017 1555   NITRITE NEGATIVE 01/09/2017 1555   LEUKOCYTESUR MODERATE (A) 12/22/2016 1555   LEUKOCYTESUR Trace 10/31/2013 1709     RADIOLOGY: Dg Chest Portable 1 View  Result Date: 01/16/2017 CLINICAL DATA:  Shortness of breath, history dementia, hypertension, diabetes mellitus EXAM: PORTABLE CHEST 1 VIEW COMPARISON:  Portable exam 1531 hours compared to 01/28/2017 FINDINGS: BILATERAL subclavian pacemaker leads projecting over RIGHT atrium and RIGHT ventricle. Pacemaker generator attached to RIGHT subclavian pacemaker leads. Enlargement of cardiac silhouette post CABG. BILATERAL pulmonary infiltrates appear increased in the upper lobes. RIGHT pleural effusion and basilar atelectasis. No pneumothorax. Bones demineralized. IMPRESSION: BILATERAL pulmonary infiltrates question pulmonary edema or less likely infection with associated RIGHT pleural effusion and basilar atelectasis. Electronically Signed   By: Ulyses Southward M.D.   On: 12/26/2016 15:59    EKG: Orders placed or performed during the hospital encounter of 01/13/2017  . ED EKG  . ED EKG  . ED EKG  . ED EKG  . ED EKG 12-Lead  . ED EKG 12-Lead    IMPRESSION AND PLAN:  * Sepsis    Aspiration pneumonia    UTI    IV vanc +Zosyn   Bl cx, ur cx.   SLP eval.  Prognosis very poor, discussed with 2 son in room- Explained "She is nearing death, and may not recover from these episodes or just get repeated admissions now."   They will like to discuss with other siblings and get back to [place].  * UTI   Indwelling cath   Abx and cx for now.  * Anemia of chronic disease    May be old age. And not proper nutrition    Stable, monitor.     All the records are reviewed and case discussed with ED provider. Management plans discussed with the patient, family and they are in agreement.  CODE STATUS: DNR Code Status History    Date Active Date Inactive Code Status Order ID Comments User Context   01-06-2017   5:43 PM 2017/01/06  5:43 PM Partial Code [id number]  Altamese Dilling, MD ED   12/28/2016  3:12 PM 01/02/2017  2:41 PM DNR [id number]  Shaune Pollack, MD Inpatient   11/12/2016  1:52 PM 11/20/2016  5:05 PM DNR [id number]  Katha Hamming, MD ED   11/12/2016  1:39 PM 11/12/2016  1:52 PM Full Code [id number]  Katha Hamming, MD ED   07/10/2015 10:55 PM 07/26/2015  8:43 PM DNR [id number]  Oralia Manis, MD Inpatient   06/24/2015  5:41 PM 06/26/2015  7:08 PM DNR [id number]  Suan Halter, MD Inpatient   06/21/2015  8:04 PM 06/24/2015  5:41 PM Full Code [id number]  Houston Siren, MD Inpatient   06/12/2015  8:35 PM 06/15/2015  7:56 PM Full Code [id number]  Enid Baas, MD Inpatient    Questions for Most Recent Historical Code Status (Order [id number])    Question Answer Comment   In the event of cardiac or respiratory ARREST: Initiate Code Blue, Call Rapid Response Yes    In the event of cardiac or respiratory ARREST: Perform CPR Yes    In the event of cardiac or respiratory ARREST: Perform Intubation/Mechanical Ventilation No    In the event of cardiac or respiratory ARREST: Use NIPPV/BiPAp only if indicated Yes    In the event of cardiac or respiratory ARREST: Administer ACLS medications if indicated Yes    In the event of cardiac or respiratory ARREST: Perform Defibrillation or Cardioversion if indicated Yes         Advance Directive Documentation     Most Recent Value  Type of Advance Directive  Out of facility DNR (pink MOST or yellow form)  Pre-existing out of facility DNR order (yellow form or pink MOST form)  -  "MOST" Form in Place?  -       TOTAL TIME TAKING CARE OF THIS PATIENT: 55 minutes.    Altamese Dilling M.D on 01-06-17   Between 7am to 6pm - Pager - [phone]  After 6pm go to [email or web address] - password [email or web address]  [facility]  Office  [phone]  CC: Primary care physician; Kurtis Bushman, MD   Note: This dictation was  prepared with Dragon dictation along with smaller phrase technology. Any transcriptional errors that result from this process are unintentional.

## 2017-01-04 NOTE — ED Provider Notes (Addendum)
[facility] Emergency Department Provider Note  ____________________________________________  Time seen: Approximately 3:15 PM  I have reviewed the triage vital signs and the nursing notes.   HISTORY  Chief Complaint Shortness of Breath  Unable to obtain history or physical from the patient due to her altered mental status.  HPI Jasmine Briggs is a 83 y.o. female with dementia,discharge 2 days ago after hospitalization for uTI, altered mental status, and SBO, left-sided PICC line, presenting for shortness of breath with hypoxia, altered mental status.on arrival to the emergency department, the patient is altered,barely answers questions, but is protecting her airway. The history is obtained from her granddaughter, who accompanies her today. Per report, since discharge, the patient has been somnolent at home. This morning, the nurse noted the patient's O2 saturations to be in the 80s so EMS was called. On arrival to the emergency department O2 sats are 84% on room air with associated tachypnea and diffuse rales bilaterally. Her O2 sats improved to 94% on 4 L nasal cannula. No additional history can be obtained at this time. BS wnl for EMS.   Past Medical History:  Diagnosis Date  . Arthritis   . Dementia   . Diabetes mellitus without complication (HCC)   . Gout   . Hypertension   . Osteomyelitis (HCC)    sacral wound    Patient Active Problem List   Diagnosis Date Noted  . Fecal impaction of colon (HCC)   . Increased oropharyngeal secretions   . At high risk for aspiration   . SBO (small bowel obstruction) (HCC)   . Palliative care by specialist   . UTI (urinary tract infection) 12/28/2016  . Advance care planning   . Goals of care, counseling/discussion   . Encephalopathy   . Protein-calorie malnutrition, severe 11/13/2016  . Acute respiratory failure with hypoxia (HCC)   . MRSA bacteremia   . Palliative care encounter   . Sepsis (HCC) 11/12/2016  .  Decubitus ulcer of sacral region, stage 3 (HCC) 07/26/2015  . Acute on chronic renal failure (HCC) 07/26/2015  . Hypernatremia 07/26/2015  . Hypokalemia 07/26/2015  . Encephalopathy, metabolic 07/26/2015  . Acute posthemorrhagic anemia 07/26/2015  . Gross hematuria 07/26/2015  . Generalized weakness 07/26/2015  . Dysphagia 07/26/2015  . Moderate malnutrition (HCC) 07/26/2015  . Failure to thrive in adult 07/26/2015  . Dementia 07/10/2015  . Hypotension 07/10/2015  . Acute CHF (congestive heart failure) (HCC) 07/10/2015  . Anasarca 07/10/2015  . Type 2 diabetes mellitus (HCC) 07/10/2015  . Aspiration pneumonia (HCC) 06/21/2015  . Pressure ulcer 06/14/2015  . Altered mental status 06/12/2015    Past Surgical History:  Procedure Laterality Date  . CARDIAC SURGERY    . IR FLUORO GUIDE CV LINE LEFT  11/19/2016  . PACEMAKER INSERTION    . PICC line placement      Current Outpatient Rx  . Order #: [id number] Class: Print  . Order #: [id number] Class: Historical Med  . Order #: [id number] Class: Print    Allergies Patient has no known allergies.  Family History  Problem Relation Age of Onset  . Diabetes Maternal Aunt     Social History Social History  Substance Use Topics  . Smoking status: Never Smoker  . Smokeless tobacco: Former Neurosurgeon    Types: Snuff  . Alcohol use No    Review of Systems Unable to obtain due to patient altered mental status.  ____________________________________________   PHYSICAL EXAM:  VITAL SIGNS: ED Triage Vitals  Enc Vitals Group  BP 12/21/2016 1501 100/70     Pulse Rate 01/07/2017 1501 79     Resp 01/13/2017 1501 (!) 22     Temp 01/18/2017 1501 98.4 F (36.9 C)     Temp Source 01/14/2017 1501 Oral     SpO2 12/31/2016 1457 (!) 89 %     Weight 01/17/2017 1502 120 lb (54.4 kg)     Height 01/03/2017 1502  (1.6 m)     Head Circumference --      Peak Flow --      Pain Score --      Pain Loc --      Pain Edu? --      Excl. in GC? --      Constitutional: the patient is somnolent and arouses to voice. She answers some basic yes or no questions but otherwise is unable to answer questions. She does have a gag reflex and is protecting her airway. She is chronically ill-appearing and toxic in appearance. Eyes: Conjunctivae are normal.  EOMI. No scleral icterus.no eye discharge. Positive arcus senilis. Head: Atraumatic. Nose: No congestion/rhinnorhea. Mouth/Throat: Mucous membranes are dry.  Neck: No stridor.  Supple.  No JVD. No meningismus. Cardiovascular: Normal rate, regular rhythm. No murmurs, rubs or gallops.  Respiratory: tachypnea with accessory muscle use and mild retractions. On my examination, the patient's O2 sats are 84% on room air. These improved to 94% on 4 L nasal cannula. She has diffuse rales in the bilateral lung fields diffusely. No wheezes. Gastrointestinal: Soft, nontender and nondistended.  No guarding or rebound.  No peritoneal signs. GU: the patient has indwelling Foley catheter that has thick yellow urine with significant sediment in it. Musculoskeletal: the patient has edema in all 4 extremities without any obvious asymmetry. She has a PICC line in the left upper extremity that is without surrounding erythema, swelling or purulent discharge. Neurologic: somnolent but arouses to voice. Maintaining her gag reflex.  Face symmetric.  EOMI.  Moves all extremities well. Skin:  Skin is warm, dry. No rash noted. Psychiatric: unable to assess  ____________________________________________   LABS (all labs ordered are listed, but only abnormal results are displayed)  Labs Reviewed  CBC WITH DIFFERENTIAL/PLATELET - Abnormal; Notable for the following:       Result Value   WBC 12.5 (*)    RBC 2.53 (*)    Hemoglobin 7.1 (*)    HCT 22.5 (*)    MCHC 31.7 (*)    RDW 20.7 (*)    Platelets 130 (*)    Neutro Abs 11.2 (*)    Lymphs Abs 0.4 (*)    All other components within normal limits  COMPREHENSIVE  METABOLIC PANEL - Abnormal; Notable for the following:    Sodium 154 (*)    Chloride 129 (*)    CO2 16 (*)    Glucose, Bld 109 (*)    Creatinine, Ser 1.03 (*)    Calcium 8.6 (*)    Total Protein 5.2 (*)    Albumin 1.8 (*)    ALT <5 (*)    GFR calc non Af Amer 45 (*)    GFR calc Af Amer 53 (*)    All other components within normal limits  TROPONIN I - Abnormal; Notable for the following:    Troponin I 0.03 (*)    All other components within normal limits  URINALYSIS, ROUTINE W REFLEX MICROSCOPIC - Abnormal; Notable for the following:    Color, Urine AMBER (*)    APPearance TURBID (*)  Hgb urine dipstick MODERATE (*)    Ketones, ur 5 (*)    Protein, ur 100 (*)    Leukocytes, UA MODERATE (*)    Squamous Epithelial / LPF 6-30 (*)    Non Squamous Epithelial 6-30 (*)    All other components within normal limits  BLOOD GAS, VENOUS - Abnormal; Notable for the following:    pCO2, Ven 30 (*)    pO2, Ven <31.0 (*)    Bicarbonate 17.3 (*)    Acid-base deficit 7.2 (*)    All other components within normal limits  CULTURE, BLOOD (ROUTINE X 2)  CULTURE, BLOOD (ROUTINE X 2)  URINE CULTURE  CULTURE, EXPECTORATED SPUTUM-ASSESSMENT  LACTIC ACID, PLASMA  LACTIC ACID, PLASMA  BRAIN NATRIURETIC PEPTIDE   ____________________________________________  EKG  ED ECG REPORT I, Rockne Menghini, the attending physician, personally viewed and interpreted this ECG.   Date: 01/22/17  EKG Time: 1508  Rate: 71  Rhythm: paced  Axis: paced   ____________________________________________  RADIOLOGY  Dg Chest Portable 1 View  Result Date: 01-22-2017 CLINICAL DATA:  Shortness of breath, history dementia, hypertension, diabetes mellitus EXAM: PORTABLE CHEST 1 VIEW COMPARISON:  Portable exam 1531 hours compared to 01/28/2017 FINDINGS: BILATERAL subclavian pacemaker leads projecting over RIGHT atrium and RIGHT ventricle. Pacemaker generator attached to RIGHT subclavian pacemaker leads.  Enlargement of cardiac silhouette post CABG. BILATERAL pulmonary infiltrates appear increased in the upper lobes. RIGHT pleural effusion and basilar atelectasis. No pneumothorax. Bones demineralized. IMPRESSION: BILATERAL pulmonary infiltrates question pulmonary edema or less likely infection with associated RIGHT pleural effusion and basilar atelectasis. Electronically Signed   By: Ulyses Southward M.D.   On: 01-22-17 15:59    ____________________________________________   PROCEDURES  Procedure(s) performed: None  Procedures  Critical Care performed: Yes, see critical care note(s) ____________________________________________   INITIAL IMPRESSION / ASSESSMENT AND PLAN / ED COURSE  Pertinent labs & imaging results that were available during my care of the patient were reviewed by me and considered in my medical decision making (see chart for details).  83 y.o. Female recently discharged after UTI and SBO presenting with hypoxia, altered mental status. Overall, I am concerned about pneumonia, either hospital-acquired or aspiration pneumonia as the patient recently has been diagnosed with known chronic dysphasia. She will receive  Empiric antibiotics for this. Her indwelling Foley is a concerning source for UTI, and the antibiotics will cover this as well.  The left-sided PICC is also a source for bacteremia although I do not see a localized infection. Urine the patient's last hospitalization, a palliative care consult was offered but the family declined it. The patient does have a DNR/DNI paperwork with her today. Plan admission for continued evaluation and treatment.   CRITICAL CARE Performed by: Rockne Menghini   Total critical care time: 50 minutes  Critical care time was exclusive of separately billable procedures and treating other patients.  Critical care was necessary to treat or prevent imminent or life-threatening deterioration.  Critical care was time spent personally  by me on the following activities: development of treatment plan with patient and/or surrogate as well as nursing, discussions with consultants, evaluation of patient's response to treatment, examination of patient, obtaining history from patient or surrogate, ordering and performing treatments and interventions, ordering and review of laboratory studies, ordering and review of radiographic studies, pulse oximetry and re-evaluation of patient's condition.  ____________________________________________  FINAL CLINICAL IMPRESSION(S) / ED DIAGNOSES  Final diagnoses:  Hypoxia  Altered mental status, unspecified altered mental status type  Acute cystitis without hematuria  Healthcare-associated pneumonia  Acute renal insufficiency         NEW MEDICATIONS STARTED DURING THIS VISIT:  New Prescriptions   No medications on file      Rockne Menghini, MD 01/09/2017 1526    Rockne Menghini, MD 12/28/2016 [phone]

## 2017-01-04 NOTE — Progress Notes (Signed)
CODE SEPSIS - PHARMACY COMMUNICATION  **Broad Spectrum Antibiotics should be administered within 1 hour of Sepsis diagnosis**  Time Code Sepsis Called/Page Received:  on 10/15  Antibiotics Ordered: Zosyn and vancomycin  Time of 1st antibiotic administration: Zosyn started @ 1635 on 10/15  Additional action taken by pharmacy: [facility] nurse [place] @ 1630 to let her know we had about 15 min left before we reached 1 hour past the sepsis diagnosis. She verbalized understanding and said they were having trouble getting IV started in patient. Abx administration was started before hour was up.   Gardner Candle ,PharmD Clinical Pharmacist  12/28/2016  4:37 PM

## 2017-01-04 NOTE — Progress Notes (Signed)
Pharmacy Antibiotic Note  Jasmine Briggs is a 83 y.o. female admitted on 02/03/17 with  sepsis .  Pharmacy has been consulted for Vancomycin and Zosyn dosing. She received vancomycin 1g IV and zosyn 3.375 IV x 1 dose in ED  Patient was recently discharge after admission for UTI.   Plan: Ke: 0.028   T1/2: 24.8   Vd: 37.8  Will start patient on vancomycin 1g IV every 36 hours, with 16 hour stack dosing. Calculated trough at Css is 18. Trough level ordered piror to 4th dose. Monitor renal function and adjust dose as needed.   Start Zosyn 3.375 IV EI every 8 hours.   Height:  (160 cm) Weight: 120 lb (54.4 kg) IBW/kg (Calculated) : 52.4  Temp (24hrs), Avg:98.4 F (36.9 C), Min:98.4 F (36.9 C), Max:98.4 F (36.9 C)   Recent Labs Lab 12/29/16 0417 12/30/16 0545 12/31/16 0540 01/01/17 0617 03-Feb-2017 1514 2017/02/03 1539  WBC 19.4* 15.1* 14.3* 11.8*  --  12.5*  CREATININE 0.64 0.73  --   --   --  1.03*  LATICACIDVEN  --   --   --   --  1.6  --     Estimated Creatinine Clearance: 28.2 mL/min (A) (by C-G formula based on SCr of 1.03 mg/dL (H)).    No Known Allergies  Antimicrobials this admission: 10/15 Zosyn  >>  10/15 Vancomycin >>  Dose adjustments this admission:   Microbiology results: 10/15  BCx: pending 10/15  UCx: pending  10/15  Sputum: pending  Thank you for allowing pharmacy to be a part of this patient's care.  Gardner Candle, PharmD, BCPS Clinical Pharmacist Feb 03, 2017 7:35 PM

## 2017-01-04 NOTE — Progress Notes (Signed)
Family Meeting Note  Advance Directive:yes  Today a meeting took place with the 2 sons.  Patient is unable to participate due [id number] capacity dementia   The following clinical team members were present during this meeting:MD  The following were discussed:Patient's diagnosis: aspiration pneumonia, UTI, Aspiration, sepsis , Patient's progosis: < 6 months and Goals for treatment: DNR  Prognosis very poor, discussed with 2 son in room- Explained "She is nearing death, and may not recover from these episodes or just get repeated admissions now."   They will like to discuss with other siblings and get back to [place].  Additional follow-up to be provided: Palliative care consult.  Time spent during discussion:20 minutes  Yvonna Brun, Heath Gold, MD

## 2017-01-04 NOTE — ED Notes (Signed)
Pt transported to room 101 

## 2017-01-04 NOTE — ED Notes (Signed)
Iv team stated they could not find a line for pt so Blood Cultures could not be drawn.

## 2017-01-04 NOTE — ED Triage Notes (Signed)
Pt arrived via ems for c/o shortness of breath - pt was discharged from hospital yesterday - noted decreased o2 sat 89% - placed on o2 via n/c at 2L - pt has PICC line in left arm and right shoulder is dislocated per family report

## 2017-01-05 DIAGNOSIS — Z515 Encounter for palliative care: Secondary | ICD-10-CM

## 2017-01-05 DIAGNOSIS — R4182 Altered mental status, unspecified: Secondary | ICD-10-CM

## 2017-01-05 DIAGNOSIS — J189 Pneumonia, unspecified organism: Secondary | ICD-10-CM

## 2017-01-05 DIAGNOSIS — A419 Sepsis, unspecified organism: Principal | ICD-10-CM

## 2017-01-05 DIAGNOSIS — R0902 Hypoxemia: Secondary | ICD-10-CM

## 2017-01-05 DIAGNOSIS — Z7189 Other specified counseling: Secondary | ICD-10-CM

## 2017-01-05 LAB — MRSA PCR SCREENING: MRSA by PCR: NEGATIVE

## 2017-01-05 LAB — BASIC METABOLIC PANEL
BUN: 16 mg/dL (ref 6–20)
CALCIUM: 8.8 mg/dL — AB (ref 8.9–10.3)
CO2: 17 mmol/L — AB (ref 22–32)
Chloride: >130 mmol/L (ref 101–111)
Creatinine, Ser: 0.94 mg/dL (ref 0.44–1.00)
GFR calc Af Amer: 59 mL/min — ABNORMAL LOW (ref >60)
GFR calc non Af Amer: 51 mL/min — ABNORMAL LOW (ref >60)
GLUCOSE: 126 mg/dL — AB (ref 65–99)
POTASSIUM: 3.5 mmol/L (ref 3.5–5.1)
Sodium: 152 mmol/L — ABNORMAL HIGH (ref 135–145)

## 2017-01-05 LAB — CBC
HEMATOCRIT: 23.8 % — AB (ref 35.0–47.0)
HEMOGLOBIN: 7.5 g/dL — AB (ref 12.0–16.0)
MCH: 28.4 pg (ref 26.0–34.0)
MCHC: 31.6 g/dL — AB (ref 32.0–36.0)
MCV: 89.7 fL (ref 80.0–100.0)
PLATELETS: 127 10*3/uL — AB (ref 150–440)
RBC: 2.65 MIL/uL — ABNORMAL LOW (ref 3.80–5.20)
RDW: 21.1 % — ABNORMAL HIGH (ref 11.5–14.5)
WBC: 14.2 10*3/uL — ABNORMAL HIGH (ref 3.6–11.0)

## 2017-01-05 MED ORDER — VANCOMYCIN HCL IN DEXTROSE 750-5 MG/150ML-% IV SOLN
750.0000 mg | INTRAVENOUS | Status: DC
  Administered 2017-01-05 – 2017-01-07 (×2): 750 mg via INTRAVENOUS
  Filled 2017-01-05 (×3): qty 150

## 2017-01-05 MED ORDER — SODIUM CHLORIDE 0.45 % IV SOLN
INTRAVENOUS | Status: DC
  Administered 2017-01-05 – 2017-01-09 (×6): via INTRAVENOUS

## 2017-01-05 MED ORDER — VANCOMYCIN HCL IN DEXTROSE 750-5 MG/150ML-% IV SOLN
750.0000 mg | INTRAVENOUS | Status: DC
  Filled 2017-01-05 (×2): qty 150

## 2017-01-05 MED ORDER — GLYCOPYRROLATE 0.2 MG/ML IJ SOLN
0.2000 mg | INTRAMUSCULAR | Status: DC | PRN
  Administered 2017-01-06 – 2017-01-07 (×4): 0.2 mg via INTRAVENOUS
  Filled 2017-01-05 (×6): qty 1

## 2017-01-05 MED ORDER — MORPHINE SULFATE (CONCENTRATE) 10 MG/0.5ML PO SOLN
5.0000 mg | ORAL | Status: DC | PRN
  Administered 2017-01-07 (×2): 5 mg via SUBLINGUAL
  Filled 2017-01-05 (×2): qty 1

## 2017-01-05 NOTE — Progress Notes (Signed)
CRITICAL VALUE ALERT  Critical Value:  Chloride greater than 130  Date & Time Notied:  01/05/2017  0620   Provider Notified: Paged [phone], awaiting call back  Orders Received/Actions taken: Spoke to Dr. Hilton Sinclair No new orders.

## 2017-01-05 NOTE — Consult Note (Signed)
WOC Nurse wound consult note Reason for Consult: Stage 4 Pressure Injury to coccyx.  Several other areas of partial thickness tissue loss and weeping due to anasarca including bilateral arms, posterior thighs, perineal area. Patient with indwelling urinary catheter. Remains of supposition discovered on underpad when turning for assessment of wound. Wound type:Pressure Pressure Injury POA: Yes Measurement: 6.5cm x 8.5cm x 2cm with areas of undermining measuring 1.5cm noted at 12 and 5 o'clock. Wound bed: red to deep red (maroon). <15% white, necrotic tissue Drainage (amount, consistency, odor) Small amount of serous exudate, scant bleeding with probing of undermined areas. Periwound: Erythematous, edematous, weeping dermatitis at posterior thighs and in perineum Dressing procedure/placement/frequency: Patient is on a therapeutic mattress with low air loss feature.  Two sons who are caregivers at home (they receive support from [facility], apparently) are present for my assessment. I will provide Nursing with guidance via the Orders for twice daily and PRN dressing changes using saline moistened roll gauze and topped with an ABD pad, secured with paper tape. Guidance for turning and repositioning from side to side, minimizing time spent in the supine position is provided.  Bilateral pressure redistribution heel boots are requested today. The patient's wounds are unlikely to heal without aggressive nutritional support and mitigation of comorbid conditions. It is noted that there is a Palliative Care consult. WOC nursing team will not follow, but will remain available to this patient, the nursing and medical teams.  Please re-consult if needed, specifically if wound progression is not consistent with patient's overall progress.. Thanks, Ladona Mow, MSN, RN, GNP, [place]  Pager# [phone]

## 2017-01-05 NOTE — Progress Notes (Signed)
Pharmacy Antibiotic Note  Jasmine Briggs is a 83 y.o. female admitted on 01/03/2017 with  sepsis.  Patient has a stage IV sacral decubitus ulcer. Patient was recently admitted and discharged on 10/13 on cephalexin for a UTI (E.coli, pan-sensitive culture from 12/28/16).  Pharmacy has been consulted to dose vancomycin and piperacillin/tazobacta,.   Plan: Patient has received initial vancomycin 1000 mg dose. Will slightly decrease maintenance dose to vancomycin 750 mg IV q36h (~14 mg/kg). VT ordered for 10/19 @ 0800 which is not quite steady state but will check to ensure not supratherapeutic.  Goal VT 15-20 mcg/mL  Kinetics: Actual BW = 53 kg, CrCl = 29 mL/min Ke:  0.028 half-life: 25 hrs Vd: 37 L Cmin (estimated) 15 mcg/mL  Continue piperacillin/tazobactam 3.375 g IV q8h EI  Height:  (157.5 cm) Weight: 118 lb 6.4 oz (53.7 kg) IBW/kg (Calculated) : 50.1  Temp (24hrs), Avg:97.9 F (36.6 C), Min:97.5 F (36.4 C), Max:98.4 F (36.9 C)   Recent Labs Lab 12/30/16 0545 12/31/16 0540 01/01/17 0617 01/09/2017 1514 01/19/2017 1539 01/05/17 0531  WBC 15.1* 14.3* 11.8*  --  12.5* 14.2*  CREATININE 0.73  --   --   --  1.03* 0.94  LATICACIDVEN  --   --   --  1.6  --   --     Estimated Creatinine Clearance: 29.6 mL/min (by C-G formula based on SCr of 0.94 mg/dL).    No Known Allergies  Antimicrobials this admission: 10/15 piperacillin/tazobactam  >>  10/15 Vancomycin >>   Microbiology results: 10/15  BCx: No growth < 12 hours 10/15  UCx: sent 10/15  Sputum: pending 10/15: MRSA PCR negative  Thank you for allowing pharmacy to be a part of this patient's care.  Cindi Carbon, PharmD, BCPS Clinical Pharmacist 01/05/2017 8:01 AM

## 2017-01-05 NOTE — Progress Notes (Signed)
Patient ID: Jasmine Briggs, female   DOB: 1920-05-01, 83 y.o.   MRN: [id number]  [facility] PROGRESS NOTE  Jasmine Briggs GNF:[id number] DOB: 1920-09-26 DOA: 01/08/2017 PCP: Kurtis Bushman, MD  HPI/Subjective: Patient with agonal-type breathing. Unresponsive and lethargic.  Objective: Vitals:   01/03/2017 1957 01/05/17 0432  BP: (!) 146/77 116/81  Pulse: 76 71  Resp: (!) 22 20  Temp: (!) 97.5 F (36.4 C) 97.8 F (36.6 C)  SpO2: 97% 96%    Filed Weights   12/28/2016 1502 01/09/2017 2001  Weight: 54.4 kg (120 lb) 53.7 kg (118 lb 6.4 oz)    ROS: Review of Systems  Unable to perform ROS: Acuity of condition   Exam: Physical Exam  Constitutional: She appears lethargic.  HENT:  Nose: No mucosal edema.  Unable to look into mouth  Eyes: Conjunctivae and lids are normal.  Neck: Carotid bruit is not present.  Cardiovascular: Regular rhythm, S1 normal, S2 normal and normal heart sounds.   Respiratory: Accessory muscle usage present. She has decreased breath sounds in the left lower field. She has wheezes in the right middle field, the right lower field, the left middle field and the left lower field. She has rhonchi in the right middle field, the right lower field, the left middle field and the left lower field.  Agonal-type breathing  GI: Soft. Bowel sounds are normal. There is no tenderness.  Musculoskeletal:       Right ankle: She exhibits no swelling.       Left ankle: She exhibits no swelling.  Lymphadenopathy:    She has no cervical adenopathy.  Neurological: She appears lethargic.  Skin: Skin is warm.  As per nursing staff stage IV decubiti on buttock  Psychiatric:  Lethargic and unable to follow commands      Data Reviewed: Basic Metabolic Panel:  Recent Labs Lab 12/30/16 0545 01/20/2017 1539 01/05/17 0531  NA 143 154* 152*  K 4.3 3.7 3.5  CL 118* 129* >130*  CO2 21* 16* 17*  GLUCOSE 86 109* 126*  BUN CREATININE 0.73 1.03* 0.94  CALCIUM 8.0*  8.6* 8.8*   Liver Function Tests:  Recent Labs Lab 12/21/2016 1539  AST 24  ALT <5*  ALKPHOS 89  BILITOT 1.0  PROT 5.2*  ALBUMIN 1.8*   CBC:  Recent Labs Lab 12/30/16 0545 12/31/16 0540 01/01/17 0617 12/24/2016 1539 01/05/17 0531  WBC 15.1* 14.3* 11.8* 12.5* 14.2*  NEUTROABS  --   --   --  11.2*  --   HGB 8.2* 7.0* 7.7* 7.1* 7.5*  HCT 25.2* 21.7* 23.6* 22.5* 23.8*  MCV 87.1 87.7 87.6 89.0 89.7  PLT 187 151 162 130* 127*   Cardiac Enzymes:  Recent Labs Lab 01/19/2017 1539  TROPONINI 0.03*   BNP (last 3 results)  Recent Labs  01/20/2017 1539  BNP 423.0*     CBG:  Recent Labs Lab 01/01/17 0749 01/01/17 1139 01/01/17 1702 01/01/17 2114 01/02/17 0733  GLUCAP 77 73 67 140* 96    Recent Results (from the past 240 hour(s))  Blood culture (routine x 2)     Status: None   Collection Time: 12/28/16  7:48 AM  Result Value Ref Range Status   Specimen Description BLOOD RIGHT FA  Final   Special Requests   Final    BOTTLES DRAWN AEROBIC AND ANAEROBIC Blood Culture adequate volume   Culture NO GROWTH 5 DAYS  Final   Report Status 01/02/2017 FINAL  Final  Urine Culture  Status: Abnormal   Collection Time: 12/28/16  7:48 AM  Result Value Ref Range Status   Specimen Description URINE, CLEAN CATCH  Final   Special Requests NONE  Final   Culture >=100,000 COLONIES/mL ESCHERICHIA COLI (A)  Final   Report Status 12/30/2016 FINAL  Final   Organism ID, Bacteria ESCHERICHIA COLI (A)  Final      Susceptibility   Escherichia coli - MIC*    AMPICILLIN 8 SENSITIVE Sensitive     CEFAZOLIN <=4 SENSITIVE Sensitive     CEFTRIAXONE <=1 SENSITIVE Sensitive     CIPROFLOXACIN <=0.25 SENSITIVE Sensitive     GENTAMICIN <=1 SENSITIVE Sensitive     IMIPENEM <=0.25 SENSITIVE Sensitive     NITROFURANTOIN <=16 SENSITIVE Sensitive     TRIMETH/SULFA <=20 SENSITIVE Sensitive     AMPICILLIN/SULBACTAM 4 SENSITIVE Sensitive     PIP/TAZO <=4 SENSITIVE Sensitive     Extended ESBL  NEGATIVE Sensitive     * >=100,000 COLONIES/mL ESCHERICHIA COLI  Blood culture (routine x 2)     Status: None   Collection Time: 12/28/16 10:51 AM  Result Value Ref Range Status   Specimen Description BLOOD RIGHT HAND  Final   Special Requests   Final    BOTTLES DRAWN AEROBIC AND ANAEROBIC Blood Culture adequate volume   Culture NO GROWTH 5 DAYS  Final   Report Status 01/02/2017 FINAL  Final  MRSA PCR Screening     Status: None   Collection Time: 2017/02/02  2:53 PM  Result Value Ref Range Status   MRSA by PCR NEGATIVE NEGATIVE Final    Comment:        The GeneXpert MRSA Assay (FDA approved for NASAL specimens only), is one component of a comprehensive MRSA colonization surveillance program. It is not intended to diagnose MRSA infection nor to guide or monitor treatment for MRSA infections.   Culture, expectorated sputum-assessment     Status: None   Collection Time: 2017/02/02  8:36 PM  Result Value Ref Range Status   Specimen Description SPU  Final   Special Requests NONE  Final   Sputum evaluation THIS SPECIMEN IS ACCEPTABLE FOR SPUTUM CULTURE  Final   Report Status 02/02/17 FINAL  Final  Culture, respiratory (NON-Expectorated)     Status: None (Preliminary result)   Collection Time: 02-02-2017  8:36 PM  Result Value Ref Range Status   Specimen Description SPU  Final   Special Requests NONE Reflexed from [id number]  Final   Gram Stain   Final    MODERATE WBC PRESENT,BOTH PMN AND MONONUCLEAR NO SQUAMOUS EPITHELIAL CELLS SEEN RARE GRAM NEGATIVE RODS Performed at [facility], [address] [place], [place] [address]    Culture PENDING  Incomplete   Report Status PENDING  Incomplete  Blood Culture (routine x 2)     Status: None (Preliminary result)   Collection Time: 2017/02/02  9:00 PM  Result Value Ref Range Status   Specimen Description BLOOD RIGHT WRIST  Final   Special Requests   Final    BOTTLES DRAWN AEROBIC AND ANAEROBIC Blood Culture adequate volume   Culture  NO GROWTH < 12 HOURS  Final   Report Status PENDING  Incomplete  Blood Culture (routine x 2)     Status: None (Preliminary result)   Collection Time: 2017-02-02 10:09 PM  Result Value Ref Range Status   Specimen Description BLOOD RT HAND  Final   Special Requests   Final    BOTTLES DRAWN AEROBIC AND ANAEROBIC Blood  Culture results may not be optimal due to an inadequate volume of blood received in culture bottles   Culture NO GROWTH < 12 HOURS  Final   Report Status PENDING  Incomplete     Studies: Dg Chest Portable 1 View  Result Date: 12-Jan-2017 CLINICAL DATA:  Shortness of breath, history dementia, hypertension, diabetes mellitus EXAM: PORTABLE CHEST 1 VIEW COMPARISON:  Portable exam 1531 hours compared to 01/28/2017 FINDINGS: BILATERAL subclavian pacemaker leads projecting over RIGHT atrium and RIGHT ventricle. Pacemaker generator attached to RIGHT subclavian pacemaker leads. Enlargement of cardiac silhouette post CABG. BILATERAL pulmonary infiltrates appear increased in the upper lobes. RIGHT pleural effusion and basilar atelectasis. No pneumothorax. Bones demineralized. IMPRESSION: BILATERAL pulmonary infiltrates question pulmonary edema or less likely infection with associated RIGHT pleural effusion and basilar atelectasis. Electronically Signed   By: Ulyses Southward M.D.   On: 2017/01/12 15:59    Scheduled Meds: . heparin  5,000 Units Subcutaneous Q8H   Continuous Infusions: . sodium chloride 75 mL/hr at 01/12/2017 1953  . piperacillin-tazobactam (ZOSYN)  IV 3.375 g (01/05/17 0536)  . vancomycin      Assessment/Plan:  1. Acute respiratory failure with agonal-type Breathing.  With this type of breathing overall prognosis is poor and usually we are at the end of life.  Family member in the room once to wait for more family members before deciding on anything further. In the past they have been hesitant about comfort measures and hesitant right now.  Continue oxygen supplementation.  Palliative care consultation ordered by admitting physician.  Family member in the room declined morphine at this time. 2. Clinical sepsis. Unclear source whether this is the stage IV decubitus ulcer versus pneumonia versus UTI. On aggressive antibiotics with vancomycin and Zosyn. 3. Hypernatremia secondary to not eating very well. 4. History of dementia 5. Type 2 diabetes mellitus 6. Stage IV decubitus buttock.  Wound care consult.  Code Status:     Code Status Orders        Start     Ordered   2017-01-12 2113  Do not attempt resuscitation (DNR)  Continuous    Question Answer Comment  In the event of cardiac or respiratory ARREST Do not call a "code blue"   In the event of cardiac or respiratory ARREST Do not perform Intubation, CPR, defibrillation or ACLS   In the event of cardiac or respiratory ARREST Use medication by any route, position, wound care, and other measures to relive pain and suffering. May use oxygen, suction and manual treatment of airway obstruction as needed for comfort.      01-12-17 2112    Code Status History    Date Active Date Inactive Code Status Order ID Comments User Context   01/12/17  5:43 PM 2017-01-12  5:43 PM Partial Code [id number]  Altamese Dilling, MD ED   12/28/2016  3:12 PM 01/02/2017  2:41 PM DNR [id number]  Shaune Pollack, MD Inpatient   11/12/2016  1:52 PM 11/20/2016  5:05 PM DNR [id number]  Katha Hamming, MD ED   11/12/2016  1:39 PM 11/12/2016  1:52 PM Full Code [id number]  Katha Hamming, MD ED   07/10/2015 10:55 PM 07/26/2015  8:43 PM DNR [id number]  Oralia Manis, MD Inpatient   06/24/2015  5:41 PM 06/26/2015  7:08 PM DNR [id number]  Suan Halter, MD Inpatient   06/21/2015  8:04 PM 06/24/2015  5:41 PM Full Code [id number]  Houston Siren, MD Inpatient   06/12/2015  8:35 PM 06/15/2015  7:56 PM  Full Code [id number]  Enid Baas, MD Inpatient    Advance Directive Documentation     Most Recent Value  Type of Advance Directive  Out of  facility DNR (pink MOST or yellow form)  Pre-existing out of facility DNR order (yellow form or pink MOST form)  -  "MOST" Form in Place?  -     Family Communication: Daughter at the bedside Disposition Plan: to be determined  Antibiotics:  Vancomycin  Zosyn  Time spent: 28 minutes now  Alford Highland  [employer]

## 2017-01-05 NOTE — Progress Notes (Signed)
Daily Progress Note   Patient Name: Jasmine Briggs       Date: 01/05/2017 DOB: [birth year]  Age: 83 y.o. MRN#: [id number] Attending Physician: Loletha Grayer, MD Primary Care Physician: Caryl Never, MD Admit Date: 12/26/2016  Reason for Consultation/Follow-up: Establishing goals of care and Terminal Care  Subjective/GOC: Patient lethargic with agonal breathing. No signs or symptoms of discomfort.   Family at [employer] and [name] and a granddaughter. (Met with these sons last week on multiple occasions).   Discussed hospital diagnoses, interventions, and underlying co-morbidities. Ramond asks "what else can we do besides antibiotics and fluids." Educated on dementia trajectory and overall decline due to progression of disease. Explained recurrent infections that may not resolve even with aggressive antibiotics and fluids. Explained risk for continued aspiration on secretions.   Ramond feels as if he should be doing more. Explained that him and family members have done everything possible to care for her at home, but decline can continue to occur secondary to progressive dementia.   We again discussed "nature taking its course" despite aggressive antibiotics and fluids. Ramond nods his head yes.   Discussed use of prn medications to relief pain or suffering. Sons are agreeable with prn robinul for secretions and small dose liquid morphine if she grimaces or becomes uncomfortable.   Spiritual family. Their pastor has visited Ms. Vanamburg at bedside. Therapeutic listening and emotional support provided. Answered questions and concerns.   Length of Stay: 1  Current Medications: Scheduled Meds:  . heparin  5,000 Units Subcutaneous Q8H    Continuous Infusions: . sodium  chloride 50 mL/hr at 01/05/17 0852  . piperacillin-tazobactam (ZOSYN)  IV Stopped (01/05/17 0929)  . vancomycin      PRN Meds: docusate sodium, glycopyrrolate, ipratropium-albuterol, morphine CONCENTRATE  Physical Exam  Constitutional: She appears lethargic. She appears ill.  Cardiovascular: Regular rhythm.   Pulmonary/Chest: She has decreased breath sounds. She has rhonchi.  Agonal breathing/audible secretions  Musculoskeletal: She exhibits edema (generalized).  Neurological: She appears lethargic.  Skin: Skin is warm.  Nursing note and vitals reviewed.          Vital Signs: BP 116/81 (BP Location: Right Arm)   Pulse 71   Temp 97.8 F (36.6 C)   Resp 20   Ht 5' 2"  (1.575 m)   Wt 53.7 kg (  118 lb 6.4 oz)   SpO2 96%   BMI 21.66 kg/m  SpO2: SpO2: 96 % O2 Device: O2 Device: Nasal Cannula O2 Flow Rate: O2 Flow Rate (L/min): 4 L/min  Intake/output summary:  Intake/Output Summary (Last 24 hours) at 01/05/17 1515 Last data filed at 01/05/17 0851  Gross per 24 hour  Intake           1022.5 ml  Output              350 ml  Net            672.5 ml   LBM: Last BM Date: 12/23/2016 Baseline Weight: Weight: 54.4 kg (120 lb) Most recent weight: Weight: 53.7 kg (118 lb 6.4 oz)       Palliative Assessment/Data: PPS 20%     Patient Active Problem List   Diagnosis Date Noted  . Fecal impaction of colon ([place])   . Increased oropharyngeal secretions   . At high risk for aspiration   . SBO (small bowel obstruction) ([place])   . Palliative care by specialist   . UTI (urinary tract infection) 12/28/2016  . Advance care planning   . Goals of care, counseling/discussion   . Encephalopathy   . Protein-calorie malnutrition, severe 11/13/2016  . Acute respiratory failure with hypoxia ([place])   . MRSA bacteremia   . Palliative care encounter   . Sepsis ([place]) 11/12/2016  . Decubitus ulcer of sacral region, stage 3 ([place]) 07/26/2015  . Acute on chronic renal failure ([place]) 07/26/2015  .  Hypernatremia 07/26/2015  . Hypokalemia 07/26/2015  . Encephalopathy, metabolic 23/53/6144  . Acute posthemorrhagic anemia 07/26/2015  . Gross hematuria 07/26/2015  . Generalized weakness 07/26/2015  . Dysphagia 07/26/2015  . Moderate malnutrition ([place]) 07/26/2015  . Failure to thrive in adult 07/26/2015  . Dementia 07/10/2015  . Hypotension 07/10/2015  . Acute CHF (congestive heart failure) ([place]) 07/10/2015  . Anasarca 07/10/2015  . Type 2 diabetes mellitus ([place]) 07/10/2015  . Aspiration pneumonia ([place]) 06/21/2015  . Pressure ulcer 06/14/2015  . Altered mental status 06/12/2015    Palliative Care Assessment & Plan   Patient Profile: 83 y.o. female  with past medical history of dementia, arthritis, gout, diabetes mellitus, hypertension, sacral decubitus wounds, and osteomyelitis admitted on 12/28/2016 with altered mental status. Recent admission for sacral decubitus wounds with osteomyelitis-followed by Dr. Ola Spurr and started on vancomycin and cefepime via PICC. In ED, found to have UTI and leukocytosis. CT scan revealed small bowel obstruction and stool retention. Surgery consulted. She is not a surgical candidate and family expressed wishes against surgery. Considering NG tube placement. Patient to receive enemas prn and mechanical disimpaction if necessary. Palliative medicine consultation for goals of care.    Assessment: Acute respiratory failure Sepsis Aspiration pneumonia UTI Hypernatremia Dementia Sacral decubitus ulcer  Recommendations/Plan:  DNR/DNI  Continue current interventions.   Family agreeable with:  Robinul 0.9m IV q4h prn secretions  Roxanol 527mSL q3h prn pain/dyspnea/air hunger/grimacing. Only give per family request!!  Guarded prognosis with agonal breathing. PMT will continue to support patient/family through hospitalization.    Code Status: DNR/DNI   Code Status Orders        Start     Ordered   01/11/2017 2113  Do not attempt  resuscitation (DNR)  Continuous    Question Answer Comment  In the event of cardiac or respiratory ARREST Do not call a "code blue"   In the event of cardiac or respiratory ARREST Do not perform Intubation,  CPR, defibrillation or ACLS   In the event of cardiac or respiratory ARREST Use medication by any route, position, wound care, and other measures to relive pain and suffering. May use oxygen, suction and manual treatment of airway obstruction as needed for comfort.      12/26/2016 2112    Code Status History    Date Active Date Inactive Code Status Order ID Comments User Context   12/31/2016  5:43 PM 12/30/2016  5:43 PM Partial Code [id number]  Vaughan Basta, MD ED   12/28/2016  3:12 PM 01/02/2017  2:41 PM DNR [id number]  Demetrios Loll, MD Inpatient   11/12/2016  1:52 PM 11/20/2016  5:05 PM DNR [id number]  Epifanio Lesches, MD ED   11/12/2016  1:39 PM 11/12/2016  1:52 PM Full Code [id number]  Epifanio Lesches, MD ED   07/10/2015 10:55 PM 07/26/2015  8:43 PM DNR [id number]  Lance Coon, MD Inpatient   06/24/2015  5:41 PM 06/26/2015  7:08 PM DNR [id number]  Colleen Can, MD Inpatient   06/21/2015  8:04 PM 06/24/2015  5:41 PM Full Code [id number]  Henreitta Leber, MD Inpatient   06/12/2015  8:35 PM 06/15/2015  7:56 PM Full Code [id number]  Gladstone Lighter, MD Inpatient    Advance Directive Documentation     Most Recent Value  Type of Advance Directive  Out of facility DNR (pink MOST or yellow form)  Pre-existing out of facility DNR order (yellow form or pink MOST form)  -  "MOST" Form in Place?  -       Prognosis:   poor prognosis  Discharge Planning:  To Be Determined  Care plan was discussed with two sons (Ramond and [name]) and granddaughter, RN, Dr. Leslye Peer   Thank you for allowing the Palliative Medicine Team to assist in the care of this patient.   Time In: 2458 Time Out: 1520 Total Time 31mn Prolonged Time Billed  no      Greater than 50%  of this time was spent  counseling and coordinating care related to the above assessment and plan.  MIhor Dow FNP-C Palliative Medicine Team  Phone: [phone]: [phone] Please contact Palliative Medicine Team phone at [phone] questions and concerns.

## 2017-01-06 LAB — CREATININE, SERUM
Creatinine, Ser: 1.21 mg/dL — ABNORMAL HIGH (ref 0.44–1.00)
GFR calc non Af Amer: 37 mL/min — ABNORMAL LOW (ref >60)
GFR, EST AFRICAN AMERICAN: 43 mL/min — AB (ref >60)

## 2017-01-06 LAB — URINE CULTURE

## 2017-01-06 MED ORDER — FLUCONAZOLE 100MG IVPB
100.0000 mg | INTRAVENOUS | Status: DC
  Administered 2017-01-06 – 2017-01-09 (×4): 100 mg via INTRAVENOUS
  Filled 2017-01-06 (×5): qty 50

## 2017-01-06 MED ORDER — SODIUM CHLORIDE 0.9% FLUSH: 10.0000 mL | INTRAVENOUS | Status: DC | PRN

## 2017-01-06 NOTE — Progress Notes (Signed)
Patient ID: Jasmine CaraSarah Livers, female   DOB: 11-20-1920, 83 y.o.   MRN: [id number]  [facility] PROGRESS NOTE  Jasmine Briggs [id number] DOB: 11-20-1920 DOA: Aug 27, 2016 PCP: Kurtis BushmanGladman, Christine D, MD  HPI/Subjective: Patient with agonal-type breathing. Opens eyes to sternal rub.  Objective: Vitals:   01/05/17 2008 01/06/17 0411  BP: 104/83 (!) 98/48  Pulse: 70 70  Resp: 20 20  Temp:  98 F (36.7 C)  SpO2: 100% 93%    Filed Weights   17-Jun-2016 1502 17-Jun-2016 2001  Weight: 54.4 kg (120 lb) 53.7 kg (118 lb 6.4 oz)    ROS: Review of Systems  Unable to perform ROS: Acuity of condition   Exam: Physical Exam  Constitutional: She appears lethargic.  HENT:  Nose: No mucosal edema.  Unable to look into mouth  Eyes: Conjunctivae and lids are normal.  Neck: Carotid bruit is not present.  Cardiovascular: Regular rhythm, S1 normal, S2 normal and normal heart sounds.   Respiratory: Accessory muscle usage present. She has decreased breath sounds in the left lower field. She has wheezes in the right middle field, the right lower field, the left middle field and the left lower field. She has rhonchi in the right middle field, the right lower field, the left middle field and the left lower field.  Agonal-type breathing  GI: Soft. Bowel sounds are normal. There is no tenderness.  Musculoskeletal:       Right ankle: She exhibits no swelling.       Left ankle: She exhibits no swelling.  Lymphadenopathy:    She has no cervical adenopathy.  Neurological: She appears lethargic.  Opens eyes to sternal rub  Skin: Skin is warm.  As per nursing staff stage IV decubiti on buttock  Psychiatric:  Opens eyes to sternal rub      Data Reviewed: Basic Metabolic Panel:  Recent Labs Lab 17-Jun-2016 1539 01/05/17 0531 01/06/17 0500  NA 154* 152*  --   K 3.7 3.5  --   CL 129* >130*  --   CO2 16* 17*  --   GLUCOSE 109* 126*  --   BUN 16 16  --   CREATININE 1.03* 0.94 1.21*  CALCIUM 8.6* 8.8*   --    Liver Function Tests:  Recent Labs Lab 17-Jun-2016 1539  AST 24  ALT <5*  ALKPHOS 89  BILITOT 1.0  PROT 5.2*  ALBUMIN 1.8*   CBC:  Recent Labs Lab 12/31/16 0540 01/01/17 0617 17-Jun-2016 1539 01/05/17 0531  WBC 14.3* 11.8* 12.5* 14.2*  NEUTROABS  --   --  11.2*  --   HGB 7.0* 7.7* 7.1* 7.5*  HCT 21.7* 23.6* 22.5* 23.8*  MCV 87.7 87.6 89.0 89.7  PLT 151 162 130* 127*   Cardiac Enzymes:  Recent Labs Lab 17-Jun-2016 1539  TROPONINI 0.03*   BNP (last 3 results)  Recent Labs  17-Jun-2016 1539  BNP 423.0*     CBG:  Recent Labs Lab 01/01/17 0749 01/01/17 1139 01/01/17 1702 01/01/17 2114 01/02/17 0733  GLUCAP 77 73 67 140* 96       Studies: Dg Chest Portable 1 View  Result Date: Aug 27, 2016 CLINICAL DATA:  Shortness of breath, history dementia, hypertension, diabetes mellitus EXAM: PORTABLE CHEST 1 VIEW COMPARISON:  Portable exam 1531 hours compared to 01/28/2017 FINDINGS: BILATERAL subclavian pacemaker leads projecting over RIGHT atrium and RIGHT ventricle. Pacemaker generator attached to RIGHT subclavian pacemaker leads. Enlargement of cardiac silhouette post CABG. BILATERAL pulmonary infiltrates appear increased in the upper lobes. RIGHT pleural  effusion and basilar atelectasis. No pneumothorax. Bones demineralized. IMPRESSION: BILATERAL pulmonary infiltrates question pulmonary edema or less likely infection with associated RIGHT pleural effusion and basilar atelectasis. Electronically Signed   By: Ulyses Southward M.D.   On: 09-Jan-2017 15:59    Scheduled Meds: . heparin  5,000 Units Subcutaneous Q8H   Continuous Infusions: . sodium chloride 50 mL/hr at 01/06/17 0532  . piperacillin-tazobactam (ZOSYN)  IV Stopped (01/06/17 1004)  . vancomycin Stopped (01/05/17 1907)    Assessment/Plan:  1. Acute respiratory failure with agonal-type Breathing.  With this type of breathing overall prognosis is poor and usually we are at the end of life.  Family hesitant on  comfort measures at this point. I think the patient is actively dying. 2. Clinical sepsis. Unclear source whether this is the stage IV decubitus ulcer versus pneumonia versus UTI. On aggressive antibiotics with vancomycin and Zosyn. 3. Hypernatremia secondary to not eating very well. 4. History of dementia 5. Type 2 diabetes mellitus 6. Stage IV decubitus buttock.  Local wound care.  Code Status:     Code Status Orders        Start     Ordered   01/09/17 2113  Do not attempt resuscitation (DNR)  Continuous    Question Answer Comment  In the event of cardiac or respiratory ARREST Do not call a "code blue"   In the event of cardiac or respiratory ARREST Do not perform Intubation, CPR, defibrillation or ACLS   In the event of cardiac or respiratory ARREST Use medication by any route, position, wound care, and other measures to relive pain and suffering. May use oxygen, suction and manual treatment of airway obstruction as needed for comfort.      09-Jan-2017 2112    Code Status History    Date Active Date Inactive Code Status Order ID Comments User Context   2017-01-09  5:43 PM 09-Jan-2017  5:43 PM Partial Code [id number]  Altamese Dilling, MD ED   12/28/2016  3:12 PM 01/02/2017  2:41 PM DNR [id number]  Shaune Pollack, MD Inpatient   11/12/2016  1:52 PM 11/20/2016  5:05 PM DNR [id number]  Katha Hamming, MD ED   11/12/2016  1:39 PM 11/12/2016  1:52 PM Full Code [id number]  Katha Hamming, MD ED   07/10/2015 10:55 PM 07/26/2015  8:43 PM DNR [id number]  Oralia Manis, MD Inpatient   06/24/2015  5:41 PM 06/26/2015  7:08 PM DNR [id number]  Suan Halter, MD Inpatient   06/21/2015  8:04 PM 06/24/2015  5:41 PM Full Code [id number]  Houston Siren, MD Inpatient   06/12/2015  8:35 PM 06/15/2015  7:56 PM Full Code [id number]  Enid Baas, MD Inpatient    Advance Directive Documentation     Most Recent Value  Type of Advance Directive  Out of facility DNR (pink MOST or yellow form)   Pre-existing out of facility DNR order (yellow form or pink MOST form)  -  "MOST" Form in Place?  -     Family Communication: Daughter, granddaughter and son at the bedside Disposition Plan: I don't expect the patient to survive much longer.  Antibiotics:  Vancomycin  Zosyn  Time spent: 26 minutes now  Alford Highland  [employer]

## 2017-01-06 NOTE — Care Management Important Message (Signed)
Important Message  Patient Details  Name: Jasmine Briggs MRN: [id number] Date of Birth: August 22, 1920   Medicare Important Message Given:  Yes    Gwenette GreetBrenda S Wilma Wuthrich, RN 01/06/2017, 8:11 AM

## 2017-01-07 DIAGNOSIS — R0682 Tachypnea, not elsewhere classified: Secondary | ICD-10-CM

## 2017-01-07 LAB — CBC
HEMATOCRIT: 21.9 % — AB (ref 35.0–47.0)
Hemoglobin: 6.9 g/dL — ABNORMAL LOW (ref 12.0–16.0)
MCH: 28 pg (ref 26.0–34.0)
MCHC: 31.3 g/dL — AB (ref 32.0–36.0)
MCV: 89.6 fL (ref 80.0–100.0)
Platelets: 109 10*3/uL — ABNORMAL LOW (ref 150–440)
RBC: 2.45 MIL/uL — ABNORMAL LOW (ref 3.80–5.20)
RDW: 21.7 % — AB (ref 11.5–14.5)
WBC: 14.4 10*3/uL — ABNORMAL HIGH (ref 3.6–11.0)

## 2017-01-07 LAB — BASIC METABOLIC PANEL
Anion gap: 4 — ABNORMAL LOW (ref 5–15)
BUN: 18 mg/dL (ref 6–20)
CALCIUM: 8.6 mg/dL — AB (ref 8.9–10.3)
CO2: 18 mmol/L — ABNORMAL LOW (ref 22–32)
Chloride: 129 mmol/L — ABNORMAL HIGH (ref 101–111)
Creatinine, Ser: 1.14 mg/dL — ABNORMAL HIGH (ref 0.44–1.00)
GFR calc Af Amer: 47 mL/min — ABNORMAL LOW (ref >60)
GFR, EST NON AFRICAN AMERICAN: 40 mL/min — AB (ref >60)
GLUCOSE: 76 mg/dL (ref 65–99)
Potassium: 3.3 mmol/L — ABNORMAL LOW (ref 3.5–5.1)
Sodium: 151 mmol/L — ABNORMAL HIGH (ref 135–145)

## 2017-01-07 MED ORDER — MORPHINE SULFATE (CONCENTRATE) 10 MG/0.5ML PO SOLN: 5.0000 mg | ORAL | Status: DC | PRN

## 2017-01-07 MED ORDER — SODIUM CHLORIDE 0.45 % IV BOLUS
500.0000 mL | Freq: Once | INTRAVENOUS | Status: AC
  Administered 2017-01-07: 06:00:00 500 mL via INTRAVENOUS

## 2017-01-07 NOTE — Progress Notes (Signed)
Patient ID: Jasmine Briggs, female   DOB: 1920/08/10, 83 y.o.   MRN: [id number]  [facility] PROGRESS NOTE  Phylisha Dix WUJ:[id number] DOB: Apr 25, 1920 DOA: 01/18/2017 PCP: Kurtis Bushman, MD  HPI/Subjective: Patient with agonal breathing. Patient now with no response with sternal rub.  Objective: Vitals:   01/07/17 0537 01/07/17 0546  BP: (!) 85/30   Pulse: 70   Resp: 16   Temp:  (!) 93 F (33.9 C)  SpO2: 100%     Filed Weights   01/18/2017 1502 01/19/2017 2001  Weight: 54.4 kg (120 lb) 53.7 kg (118 lb 6.4 oz)    ROS: Review of Systems  Unable to perform ROS: Acuity of condition   Exam: Physical Exam  HENT:  Nose: No mucosal edema.  Unable to look into mouth  Eyes: Conjunctivae and lids are normal.  Pupils dilated nonreactive  Neck: Carotid bruit is not present.  Cardiovascular: Regular rhythm, S1 normal, S2 normal and normal heart sounds.   Respiratory: Accessory muscle usage present. She has decreased breath sounds in the left lower field. She has wheezes in the right middle field, the right lower field, the left middle field and the left lower field. She has rhonchi in the right middle field, the right lower field, the left middle field and the left lower field.  Agonal-type breathing  GI: Soft. Bowel sounds are normal. There is no tenderness.  Musculoskeletal:       Right ankle: She exhibits no swelling.       Left ankle: She exhibits no swelling.  Lymphadenopathy:    She has no cervical adenopathy.  Neurological: She is unresponsive.  Opens eyes to sternal rub  Skin: Skin is warm.  As per nursing staff stage IV decubiti on buttock  Psychiatric:  Opens eyes to sternal rub      Data Reviewed: Basic Metabolic Panel:  Recent Labs Lab 12/28/2016 1539 01/05/17 0531 01/06/17 0500 01/07/17 0402  NA 154* 152*  --  151*  K 3.7 3.5  --  3.3*  CL 129* >130*  --  129*  CO2 16* 17*  --  18*  GLUCOSE 109* 126*  --  76  BUN 16 16  --  18  CREATININE 1.03*  0.94 1.21* 1.14*  CALCIUM 8.6* 8.8*  --  8.6*   Liver Function Tests:  Recent Labs Lab 01/19/2017 1539  AST 24  ALT <5*  ALKPHOS 89  BILITOT 1.0  PROT 5.2*  ALBUMIN 1.8*   CBC:  Recent Labs Lab 01/01/17 0617 12/26/2016 1539 01/05/17 0531 01/07/17 0402  WBC 11.8* 12.5* 14.2* 14.4*  NEUTROABS  --  11.2*  --   --   HGB 7.7* 7.1* 7.5* 6.9*  HCT 23.6* 22.5* 23.8* 21.9*  MCV 87.6 89.0 89.7 89.6  PLT 162 130* 127* 109*   Cardiac Enzymes:  Recent Labs Lab 01/08/2017 1539  TROPONINI 0.03*   BNP (last 3 results)  Recent Labs  01/17/2017 1539  BNP 423.0*     CBG:  Recent Labs Lab 01/01/17 0749 01/01/17 1139 01/01/17 1702 01/01/17 2114 01/02/17 0733  GLUCAP 77 73 67 140* 96       Studies: No results found.  Scheduled Meds: . heparin  5,000 Units Subcutaneous Q8H   Continuous Infusions: . sodium chloride 50 mL/hr at 01/07/17 1058  . fluconazole (DIFLUCAN) IV Stopped (01/06/17 2118)  . piperacillin-tazobactam (ZOSYN)  IV Stopped (01/07/17 0932)  . vancomycin Stopped (01/07/17 9562)    Assessment/Plan:  1. Acute respiratory failure with  agonal-type Breathing.  With this type of breathing overall prognosis is poor and usually we are at the end of life.  Family hesitant on comfort measures at this point. Family allowed for some morphine earlier. 2. Clinical sepsis. Unclear source whether this is the stage IV decubitus ulcer versus pneumonia. On aggressive antibiotics with vancomycin and Zosyn and Diflucan. Patient also with hypothermia 3. Hypernatremia secondary to not eating very well. 4. History of dementia 5. Type 2 diabetes mellitus 6. Stage IV decubitus buttock.  Local wound care.  Code Status:     Code Status Orders        Start     Ordered   19-Feb-2017 2113  Do not attempt resuscitation (DNR)  Continuous    Question Answer Comment  In the event of cardiac or respiratory ARREST Do not call a "code blue"   In the event of cardiac or respiratory  ARREST Do not perform Intubation, CPR, defibrillation or ACLS   In the event of cardiac or respiratory ARREST Use medication by any route, position, wound care, and other measures to relive pain and suffering. May use oxygen, suction and manual treatment of airway obstruction as needed for comfort.      19-Feb-2017 2112    Code Status History    Date Active Date Inactive Code Status Order ID Comments User Context   25-Apr-2016  5:43 PM 25-Apr-2016  5:43 PM Partial Code [id number]  Altamese DillingVachhani, Vaibhavkumar, MD ED   12/28/2016  3:12 PM 01/02/2017  2:41 PM DNR [id number]  Shaune Pollackhen, Qing, MD Inpatient   11/12/2016  1:52 PM 11/20/2016  5:05 PM DNR [id number]  Katha HammingKonidena, Snehalatha, MD ED   11/12/2016  1:39 PM 11/12/2016  1:52 PM Full Code [id number]  Katha HammingKonidena, Snehalatha, MD ED   07/10/2015 10:55 PM 07/26/2015  8:43 PM DNR [id number]  Oralia ManisWillis, David, MD Inpatient   06/24/2015  5:41 PM 06/26/2015  7:08 PM DNR [id number]  Suan Halterampbell, Margaret F, MD Inpatient   06/21/2015  8:04 PM 06/24/2015  5:41 PM Full Code [id number]  Houston SirenSainani, Vivek J, MD Inpatient   06/12/2015  8:35 PM 06/15/2015  7:56 PM Full Code [id number]  Enid BaasKalisetti, Radhika, MD Inpatient    Advance Directive Documentation     Most Recent Value  Type of Advance Directive  Out of facility DNR (pink MOST or yellow form)  Pre-existing out of facility DNR order (yellow form or pink MOST form)  -  "MOST" Form in Place?  -     Family Communication: spoke with family, granddaughter and sister in the patient room. Disposition Plan: I don't expect the patient to survive much longer.  Antibiotics:  Vancomycin  Zosyn  Diflucan  Time spent: 28 minutes now  Alford HighlandWIETING, Rudie Sermons  [employer]

## 2017-01-07 NOTE — Progress Notes (Signed)
Pts BP this am 79/22.  Md notified.  500 bolus of fluids given.  Repeat BP was 85/30.  Spoke to son and made him aware of her decline.  Agonal breathing has continued throughout the night and Robinul given.

## 2017-01-07 NOTE — Progress Notes (Signed)
Daily Progress Note   Patient Name: Jasmine Briggs       Date: 01/07/2017 DOB: 26-Oct-1920  Age: 83 y.o. MRN#: [id number] Attending Physician: Alford HighlandWieting, Richard, MD Primary Care Physician: Kurtis BushmanGladman, Christine D, MD Admit Date: 01/07/2017  Reason for Consultation/Follow-up: Establishing goals of care and Terminal Care  Subjective/GOC: Upon arrival to room, RN at bedside giving prn Roxanol. Patient with agonal breathing and tachypnea. Will open eyes for family members at bedside.   Sons, Ramond and [place], and sister at bedside. Ramond tells me she has been grimacing and they have been accepting of prn Roxanol. They speak of her being a very strong woman but "do not want to see her suffer." Her sister states "I hurt when she hurts." We discussed liberalizing the morphine to every 2 hours (or more) if needed, which family is agreeable with. I did mention my concern with fluid in the lungs secondary to IV fluids. We also again discussed hospital diagnoses.   Ramond tells me many family members have been visiting. Therapeutic listening and emotional support provided as they share stories of Ms. Dot BeenDark.    Length of Stay: 3  Current Medications: Scheduled Meds:    Continuous Infusions: . sodium chloride 50 mL/hr at 01/07/17 1058  . fluconazole (DIFLUCAN) IV Stopped (01/06/17 2118)  . piperacillin-tazobactam (ZOSYN)  IV Stopped (01/07/17 0932)  . vancomycin Stopped (01/07/17 [id number])    PRN Meds: docusate sodium, glycopyrrolate, ipratropium-albuterol, morphine CONCENTRATE, sodium chloride flush  Physical Exam  Constitutional: She is easily aroused. She appears ill.  Cardiovascular: Regular rhythm.   Pulmonary/Chest: She has rhonchi.  Agonal breathing with tachypnea. RN at bedside giving Roxanol.    Musculoskeletal: She exhibits edema (generalized).  Neurological: She is easily aroused.  Opens eyes to voice  Skin: Skin is warm.  Psychiatric: She is inattentive.  Nursing note and vitals reviewed.          Vital Signs: BP (!) 85/30 (BP Location: Right Wrist)   Pulse 70   Temp (!) 93 F (33.9 C) (Rectal)   Resp 16   Ht 5\' 2"  (1.575 m)   Wt 53.7 kg (118 lb 6.4 oz)   SpO2 100%   BMI 21.66 kg/m  SpO2: SpO2: 100 % O2 Device: O2 Device: Nasal Cannula O2 Flow Rate: O2 Flow Rate (L/min): 5 L/min  Intake/output summary:   Intake/Output Summary (Last 24 hours) at 01/07/17 1424 Last data filed at 01/07/17 0648  Gross per 24 hour  Intake          1508.33 ml  Output              115 ml  Net          1393.33 ml   LBM: Last BM Date: 01/05/17 Baseline Weight: Weight: 54.4 kg (120 lb) Most recent weight: Weight: 53.7 kg (118 lb 6.4 oz)       Palliative Assessment/Data: PPS 10%     Patient Active Problem List   Diagnosis Date Noted  . Hypoxia   . Healthcare-associated pneumonia   . Fecal impaction of colon (HCC)   . Increased oropharyngeal secretions   . At high risk for aspiration   . SBO (small bowel obstruction) (HCC)   . Palliative care by specialist   . UTI (urinary tract infection) 12/28/2016  . Advance care planning   . Goals of care, counseling/discussion   . Encephalopathy   . Protein-calorie malnutrition, severe 11/13/2016  . Acute respiratory failure with hypoxia (HCC)   . MRSA bacteremia   . Palliative care encounter   . Sepsis (HCC) 11/12/2016  . Decubitus ulcer of sacral region, stage 3 (HCC) 07/26/2015  . Acute on chronic renal failure (HCC) 07/26/2015  . Hypernatremia 07/26/2015  . Hypokalemia 07/26/2015  . Encephalopathy, metabolic 07/26/2015  . Acute posthemorrhagic anemia 07/26/2015  . Gross hematuria 07/26/2015  . Generalized weakness 07/26/2015  . Dysphagia 07/26/2015  . Moderate malnutrition (HCC) 07/26/2015  . Failure to thrive in adult  07/26/2015  . Dementia 07/10/2015  . Hypotension 07/10/2015  . Acute CHF (congestive heart failure) (HCC) 07/10/2015  . Anasarca 07/10/2015  . Type 2 diabetes mellitus (HCC) 07/10/2015  . Aspiration pneumonia (HCC) 06/21/2015  . Pressure ulcer 06/14/2015  . Altered mental status 06/12/2015    Palliative Care Assessment & Plan   Patient Profile: 83 y.o. female  with past medical history of dementia, arthritis, gout, diabetes mellitus, hypertension, sacral decubitus wounds, and osteomyelitis admitted on 12/28/2016 with altered mental status. Recent admission for sacral decubitus wounds with osteomyelitis-followed by Dr. Sampson Goon and started on vancomycin and cefepime via PICC. In ED, found to have UTI and leukocytosis. CT scan revealed small bowel obstruction and stool retention. Surgery consulted. She is not a surgical candidate and family expressed wishes against surgery. Considering NG tube placement. Patient to receive enemas prn and mechanical disimpaction if necessary. Palliative medicine consultation for goals of care.    Assessment: Acute respiratory failure Sepsis Aspiration pneumonia UTI Hypernatremia Dementia Sacral decubitus ulcer  Recommendations/Plan:  DNR/DNI  Continue current interventions.   Family agreeable with:  Robinul 0.2mg  IV q4h prn secretions  Roxanol 5mg  SL q2h prn pain/dyspnea/air hunger/grimacing. Only give per family request!!  Guarded prognosis with agonal breathing. PMT will continue to support patient/family through hospitalization.    Code Status: DNR/DNI   Code Status Orders        Start     Ordered   12/27/2016 2113  Do not attempt resuscitation (DNR)  Continuous    Question Answer Comment  In the event of cardiac or respiratory ARREST Do not call a "code blue"   In the event of cardiac or respiratory ARREST Do not perform Intubation, CPR, defibrillation or ACLS   In the event of cardiac or respiratory ARREST Use medication by any  route, position, wound care, and other measures to relive  pain and suffering. May use oxygen, suction and manual treatment of airway obstruction as needed for comfort.      08-Jan-2017 2112    Code Status History    Date Active Date Inactive Code Status Order ID Comments User Context   01/08/2017  5:43 PM Jan 08, 2017  5:43 PM Partial Code [id number]  Altamese Dilling, MD ED   12/28/2016  3:12 PM 01/02/2017  2:41 PM DNR [id number]  Shaune Pollack, MD Inpatient   11/12/2016  1:52 PM 11/20/2016  5:05 PM DNR [id number]  Katha Hamming, MD ED   11/12/2016  1:39 PM 11/12/2016  1:52 PM Full Code [id number]  Katha Hamming, MD ED   07/10/2015 10:55 PM 07/26/2015  8:43 PM DNR [id number]  Oralia Manis, MD Inpatient   06/24/2015  5:41 PM 06/26/2015  7:08 PM DNR [id number]  Suan Halter, MD Inpatient   06/21/2015  8:04 PM 06/24/2015  5:41 PM Full Code [id number]  Houston Siren, MD Inpatient   06/12/2015  8:35 PM 06/15/2015  7:56 PM Full Code [id number]  Enid Baas, MD Inpatient    Advance Directive Documentation     Most Recent Value  Type of Advance Directive  Out of facility DNR (pink MOST or yellow form)  Pre-existing out of facility DNR order (yellow form or pink MOST form)  -  "MOST" Form in Place?  -       Prognosis:   poor prognosis  Discharge Planning:  To Be Determined likely hospital death  Care plan was discussed with two sons Sharyon Medicus and [name]), daughter, sister, RN   Thank you for allowing the Palliative Medicine Team to assist in the care of this patient.   Time In: 1345 Time Out: 1410 Total Time Prolonged Time Billed  no      Greater than 50%  of this time was spent counseling and coordinating care related to the above assessment and plan.  Vennie Homans, FNP-C Palliative Medicine Team  Phone: [phone] Fax: [phone]  Please contact Palliative Medicine Team phone at [phone] for questions and concerns.

## 2017-01-08 LAB — CULTURE, RESPIRATORY W GRAM STAIN

## 2017-01-08 LAB — CULTURE, RESPIRATORY

## 2017-01-08 LAB — VANCOMYCIN, TROUGH: Vancomycin Tr: 29 ug/mL (ref 15–20)

## 2017-01-08 NOTE — Progress Notes (Signed)
Follow-up with patient and family at bedside. Patient minimally response. Remains with agonal breathing which appears more shallow then yesterday. Appears comfortable. Family understands they can request morphine if she appears distressed or in pain. Emotional support provided.   NO CHARGE  Vennie HomansMegan Carrell Rahmani, FNP-C Palliative Medicine Team  Phone: [phone] Fax: [phone]

## 2017-01-08 NOTE — Progress Notes (Signed)
Pharmacy Antibiotic Note  Jasmine Briggs is a 83 y.o. female admitted on 01/12/2017 with  sepsis.  Patient has a stage IV sacral decubitus ulcer. Patient was recently admitted and discharged on 10/13 on cephalexin for a UTI (E.coli, pan-sensitive culture from 12/28/16).  Pharmacy has been consulted to dose vancomycin and piperacillin/tazobacta,.   Plan: Vancomycin level elevated prior to steady-state. Will d/c vancomycin and check another level and SCr in AM.   Continue piperacillin/tazobactam 3.375 g IV q8h EI  Height: 5\' 2"  (157.5 cm) Weight: 118 lb 6.4 oz (53.7 kg) IBW/kg (Calculated) : 50.1  Temp (24hrs), Avg:91.5 F (33.1 C), Min:89 F (31.7 C), Max:94 F (34.4 C)   Recent Labs Lab 01/01/2017 1514 12/24/2016 1539 01/05/17 0531 01/06/17 0500 01/07/17 0402 01/08/17 1712  WBC  --  12.5* 14.2*  --  14.4*  --   CREATININE  --  1.03* 0.94 1.21* 1.14*  --   LATICACIDVEN 1.6  --   --   --   --   --   VANCOTROUGH  --   --   --   --   --  29*    Estimated Creatinine Clearance: 24.4 mL/min (A) (by C-G formula based on SCr of 1.14 mg/dL (H)).    No Known Allergies  Antimicrobials this admission: 10/15 piperacillin/tazobactam  >>  10/15 Vancomycin >>   Microbiology results: 10/15  BCx: NGTD 10/15  UCx: yeast 10/15  Sputum: Pseudomonas and E coli 10/15: MRSA PCR negative  Thank you for allowing pharmacy to be a part of this patient's care.  Valentina Guhristy, Ariahna Smiddy D, PharmD, BCPS Clinical Pharmacist 01/08/2017 6:19 PM

## 2017-01-08 NOTE — Progress Notes (Signed)
Patient ID: Jasmine CaraSarah Briggs, female   DOB: [birth year], 83 y.o.   MRN: [id number]  [facility] PROGRESS NOTE  Jasmine Briggs JXB:[id number] DOB: [birth year] DOA: 09/29/16 PCP: Kurtis BushmanGladman, Christine D, MD  HPI/Subjective: Patient with agonal breathing and respiratory rate starting to slow down from yesterday.  Patient grimaced with palpating abdomen  Objective: Vitals:   01/07/17 1940 01/08/17 0436  BP: (!) 77/26 (!) 79/47  Pulse: 71 (!) 59  Resp: (!) 22 16  Temp:  (!) 94 F (34.4 C)  SpO2: 100% (!) 73%    Filed Weights   09/21/2016 1502 09/21/2016 2001  Weight: 54.4 kg (120 lb) 53.7 kg (118 lb 6.4 oz)    ROS: Review of Systems  Unable to perform ROS: Acuity of condition   Exam: Physical Exam  HENT:  Nose: No mucosal edema.  Unable to look into mouth  Eyes: Conjunctivae and lids are normal.  Pupils dilated nonreactive  Neck: Carotid bruit is not present.  Cardiovascular: Regular rhythm, S1 normal, S2 normal and normal heart sounds.   Respiratory: Accessory muscle usage present. She has decreased breath sounds in the right middle field, the right lower field and the left lower field. She has no wheezes. She has rhonchi in the right middle field, the right lower field, the left middle field and the left lower field.  Agonal-type breathing  GI: Soft. Bowel sounds are normal. There is no tenderness.  Musculoskeletal:       Right ankle: She exhibits no swelling.       Left ankle: She exhibits no swelling.  Lymphadenopathy:    She has no cervical adenopathy.  Neurological: She is unresponsive.  Opens eyes to palpating abdomen. Also grimaced when palpating abdomen  Skin: Skin is warm.  As per nursing staff stage IV decubiti on buttock  Psychiatric:  Opens eyes to palpating abdomen      Data Reviewed: Basic Metabolic Panel:  Recent Labs Lab 09/21/2016 1539 01/05/17 0531 01/06/17 0500 01/07/17 0402  NA 154* 152*  --  151*  K 3.7 3.5  --  3.3*  CL 129* >130*  --  129*  CO2  16* 17*  --  18*  GLUCOSE 109* 126*  --  76  BUN 16 16  --  18  CREATININE 1.03* 0.94 1.21* 1.14*  CALCIUM 8.6* 8.8*  --  8.6*   Liver Function Tests:  Recent Labs Lab 09/21/2016 1539  AST 24  ALT <5*  ALKPHOS 89  BILITOT 1.0  PROT 5.2*  ALBUMIN 1.8*   CBC:  Recent Labs Lab 09/21/2016 1539 01/05/17 0531 01/07/17 0402  WBC 12.5* 14.2* 14.4*  NEUTROABS 11.2*  --   --   HGB 7.1* 7.5* 6.9*  HCT 22.5* 23.8* 21.9*  MCV 89.0 89.7 89.6  PLT 130* 127* 109*   Cardiac Enzymes:  Recent Labs Lab 09/21/2016 1539  TROPONINI 0.03*   BNP (last 3 results)  Recent Labs  09/21/2016 1539  BNP 423.0*     CBG:  Recent Labs Lab 01/01/17 1139 01/01/17 1702 01/01/17 2114 01/02/17 0733  GLUCAP 73 67 140* 96      Scheduled Meds:  Continuous Infusions: . sodium chloride 50 mL/hr at 01/08/17 0744  . fluconazole (DIFLUCAN) IV Stopped (01/07/17 1650)  . piperacillin-tazobactam (ZOSYN)  IV 3.375 g (01/08/17 [id number])  . vancomycin Stopped (01/07/17 [id number])    Assessment/Plan:  1. Acute hypoxic respiratory failure with agonal-type Breathing, now respiratory rate starting to slow down. The patient is in the dying process and  I don't think she will survive much longer. Case discussed with son at the bedside. Pulse ox in the 70s. 2. Clinical sepsis. Unclear source whether this is the stage IV decubitus ulcer versus pneumonia. On aggressive antibiotics with vancomycin and Zosyn and Diflucan. Patient also with hypothermia. 3. Hypernatremia secondary to not eating very well. 4. History of dementia 5. Type 2 diabetes mellitus 6. Stage IV decubitus buttock.  Local wound care. 7. anemia of chronic disease. holding off on further blood draws.  Code Status:     Code Status Orders        Start     Ordered   12/28/2016 2113  Do not attempt resuscitation (DNR)  Continuous    Question Answer Comment  In the event of cardiac or respiratory ARREST Do not call a "code blue"   In the event of  cardiac or respiratory ARREST Do not perform Intubation, CPR, defibrillation or ACLS   In the event of cardiac or respiratory ARREST Use medication by any route, position, wound care, and other measures to relive pain and suffering. May use oxygen, suction and manual treatment of airway obstruction as needed for comfort.      01/03/2017 2112    Code Status History    Date Active Date Inactive Code Status Order ID Comments User Context   01/14/2017  5:43 PM 01/11/2017  5:43 PM Partial Code [id number]  Altamese Dilling, MD ED   12/28/2016  3:12 PM 01/02/2017  2:41 PM DNR [id number]  Shaune Pollack, MD Inpatient   11/12/2016  1:52 PM 11/20/2016  5:05 PM DNR [id number]  Katha Hamming, MD ED   11/12/2016  1:39 PM 11/12/2016  1:52 PM Full Code [id number]  Katha Hamming, MD ED   07/10/2015 10:55 PM 07/26/2015  8:43 PM DNR [id number]  Oralia Manis, MD Inpatient   06/24/2015  5:41 PM 06/26/2015  7:08 PM DNR [id number]  Suan Halter, MD Inpatient   06/21/2015  8:04 PM 06/24/2015  5:41 PM Full Code [id number]  Houston Siren, MD Inpatient   06/12/2015  8:35 PM 06/15/2015  7:56 PM Full Code [id number]  Enid Baas, MD Inpatient    Advance Directive Documentation     Most Recent Value  Type of Advance Directive  Out of facility DNR (pink MOST or yellow form)  Pre-existing out of facility DNR order (yellow form or pink MOST form)  -  "MOST" Form in Place?  -     Family Communication: spoke with son at the bedside Disposition Plan: I don't expect the patient to survive much longer.  Antibiotics:  Vancomycin  Zosyn  Diflucan  Time spent: 25 minutes now  Alford Highland  [employer]

## 2017-01-09 LAB — CULTURE, BLOOD (ROUTINE X 2)
CULTURE: NO GROWTH
Culture: NO GROWTH
SPECIAL REQUESTS: ADEQUATE

## 2017-01-09 LAB — CREATININE, SERUM
CREATININE: 1.52 mg/dL — AB (ref 0.44–1.00)
GFR calc Af Amer: 33 mL/min — ABNORMAL LOW (ref >60)
GFR calc non Af Amer: 28 mL/min — ABNORMAL LOW (ref >60)

## 2017-01-09 LAB — VANCOMYCIN, RANDOM: VANCOMYCIN RM: 28

## 2017-01-09 MED ORDER — PIPERACILLIN-TAZOBACTAM 3.375 G IVPB
3.3750 g | Freq: Two times a day (BID) | INTRAVENOUS | Status: DC
  Administered 2017-01-09 – 2017-01-10 (×2): 3.375 g via INTRAVENOUS
  Filled 2017-01-09 (×2): qty 50

## 2017-01-09 NOTE — Progress Notes (Signed)
Pharmacy Antibiotic Note  Jasmine Briggs is a 83 y.o. female admitted on 2016/08/02 with  sepsis.  Patient has a stage IV sacral decubitus ulcer. Patient was recently admitted and discharged on 10/13 on cephalexin for a UTI (E.coli, pan-sensitive culture from 12/28/16).  Pharmacy has been consulted to dose vancomycin and piperacillin/tazobacta,.   Plan: Vancomycin level elevated prior to steady-state. Will d/c vancomycin and check another level and SCr in AM.   10/20:  Vanc level @ 0430 = 28.  Vanc level has only fallen 1 mcg/mL in 12 hrs.  Will hold Vanc and recheck level in 24 hrs on 10/21 @ 0500.   Ordered piperacillin/tazobactam 3.375 g IV q8h EI.  Will decrease frequency to every 12 hours based on current renal function (CrCl <20 ml/min).   Height: 5\' 2"  (157.5 cm) Weight: 118 lb 6.4 oz (53.7 kg) IBW/kg (Calculated) : 50.1   Recent Labs Lab 09-16-2016 1514 09-16-2016 1539 01/05/17 0531 01/06/17 0500 01/07/17 0402 01/08/17 1712 01/09/17 0431 01/09/17 0435  WBC  --  12.5* 14.2*  --  14.4*  --   --   --   CREATININE  --  1.03* 0.94 1.21* 1.14*  --  1.52*  --   LATICACIDVEN 1.6  --   --   --   --   --   --   --   VANCOTROUGH  --   --   --   --   --  1629*  --   --   VANCORANDOM  --   --   --   --   --   --   --  28    Estimated Creatinine Clearance: 18.3 mL/min (A) (by C-G formula based on SCr of 1.52 mg/dL (H)).    No Known Allergies  Antimicrobials this admission: 10/15 piperacillin/tazobactam  >>  10/15 Vancomycin >>   Microbiology results: 10/15  BCx: NGTD 10/15  UCx: yeast 10/15  Sputum: Pseudomonas and E coli 10/15: MRSA PCR negative  Thank you for allowing pharmacy to be a part of this patient's care.  Stormy CardKatsoudas,Ludene Stokke K, [facility] HospitalRPH Clinical Pharmacist 01/09/2017 11:50 AM

## 2017-01-09 NOTE — Progress Notes (Addendum)
Pharmacy Antibiotic Note  Jasmine Briggs is a 83 y.o. female admitted on 01/09/2017 with  sepsis.  Patient has a stage IV sacral decubitus ulcer. Patient was recently admitted and discharged on 10/13 on cephalexin for a UTI (E.coli, pan-sensitive culture from 12/28/16).  Pharmacy has been consulted to dose vancomycin and piperacillin/tazobacta,.   Plan: Vancomycin level elevated prior to steady-state. Will d/c vancomycin and check another level and SCr in AM.   Continue piperacillin/tazobactam 3.375 g IV q8h EI  10/20:  Vanc level @ 0430 = 28.  Vanc level has only fallen 1 mcg/mL in 12 hrs.  Will hold Vanc and recheck level in 24 hrs on 10/21 @ 0500.   Height: 5\' 2"  (157.5 cm) Weight: 118 lb 6.4 oz (53.7 kg) IBW/kg (Calculated) : 50.1  Temp (24hrs), Avg:89 F (31.7 C), Min:89 F (31.7 C), Max:89 F (31.7 C)   Recent Labs Lab 12/30/2016 1514 12/25/2016 1539 01/05/17 0531 01/06/17 0500 01/07/17 0402 01/08/17 1712 01/09/17 0431 01/09/17 0435  WBC  --  12.5* 14.2*  --  14.4*  --   --   --   CREATININE  --  1.03* 0.94 1.21* 1.14*  --  1.52*  --   LATICACIDVEN 1.6  --   --   --   --   --   --   --   VANCOTROUGH  --   --   --   --   --  6929*  --   --   VANCORANDOM  --   --   --   --   --   --   --  28    Estimated Creatinine Clearance: 18.3 mL/min (A) (by C-G formula based on SCr of 1.52 mg/dL (H)).    No Known Allergies  Antimicrobials this admission: 10/15 piperacillin/tazobactam  >>  10/15 Vancomycin >>   Microbiology results: 10/15  BCx: NGTD 10/15  UCx: yeast 10/15  Sputum: Pseudomonas and E coli 10/15: MRSA PCR negative  Thank you for allowing pharmacy to be a part of this patient's care.  Scherrie Gerlachobbins,Solita Macadam D, PharmD Clinical Pharmacist 01/09/2017 5:54 AM

## 2017-01-09 NOTE — Progress Notes (Signed)
Pt remains unresponsive at this time. Pt with shallow breaths and low BP. Family at bedside to offer support and comfort. Family refusing pain medication and turns for pt. IV abx continued per family request.

## 2017-01-09 NOTE — Progress Notes (Signed)
Patient ID: Jasmine CaraSarah Geraldo, female   DOB: 15-Oct-1920, 83 y.o.   MRN: [id number]  [facility] PROGRESS NOTE  Jasmine Briggs QMV:[id number] DOB: 15-Oct-1920 DOA: 12-30-2016 PCP: Kurtis BushmanGladman, Christine D, MD  HPI/Subjective: Patient remains unresponsive With agonal breathing Very low blood pressure    Objective: Vitals:   01/08/17 1950 01/09/17 0410  BP: (!) 57/25 (!) 59/22  Pulse: (!) 25 70  Resp: (!) 21 12  Temp:    SpO2: (!) 80%     Filed Weights   2016/06/19 1502 2016/06/19 2001  Weight: 120 lb (54.4 kg) 118 lb 6.4 oz (53.7 kg)    ROS: Review of Systems  Unable to perform ROS: Acuity of condition   Exam: Physical Exam  HENT:  Nose: No mucosal edema.  Unable to look into mouth  Eyes: Conjunctivae and lids are normal.  Pupils dilated nonreactive  Neck: Carotid bruit is not present.  Cardiovascular: Regular rhythm, S1 normal, S2 normal and normal heart sounds.   Respiratory: Accessory muscle usage present. She has decreased breath sounds in the right middle field, the right lower field and the left lower field. She has no wheezes. She has rhonchi in the right middle field, the right lower field, the left middle field and the left lower field.  Agonal-type breathing  GI: Soft. Bowel sounds are normal. There is no tenderness.  Musculoskeletal:       Right ankle: She exhibits no swelling.       Left ankle: She exhibits no swelling.  Lymphadenopathy:    She has no cervical adenopathy.  Neurological: She is unresponsive.  Opens eyes to palpating abdomen. Also grimaced when palpating abdomen  Skin: Skin is warm.  As per nursing staff stage IV decubiti on buttock  Psychiatric:  Opens eyes to palpating abdomen      Data Reviewed: Basic Metabolic Panel:  Recent Labs Lab 2016/06/19 1539 01/05/17 0531 01/06/17 0500 01/07/17 0402 01/09/17 0431  NA 154* 152*  --  151*  --   K 3.7 3.5  --  3.3*  --   CL 129* >130*  --  129*  --   CO2 16* 17*  --  18*  --   GLUCOSE 109* 126*   --  76  --   BUN 16 16  --  18  --   CREATININE 1.03* 0.94 1.21* 1.14* 1.52*  CALCIUM 8.6* 8.8*  --  8.6*  --    Liver Function Tests:  Recent Labs Lab 2016/06/19 1539  AST 24  ALT <5*  ALKPHOS 89  BILITOT 1.0  PROT 5.2*  ALBUMIN 1.8*   CBC:  Recent Labs Lab 2016/06/19 1539 01/05/17 0531 01/07/17 0402  WBC 12.5* 14.2* 14.4*  NEUTROABS 11.2*  --   --   HGB 7.1* 7.5* 6.9*  HCT 22.5* 23.8* 21.9*  MCV 89.0 89.7 89.6  PLT 130* 127* 109*   Cardiac Enzymes:  Recent Labs Lab 2016/06/19 1539  TROPONINI 0.03*   BNP (last 3 results)  Recent Labs  2016/06/19 1539  BNP 423.0*     CBG: No results for input(s): GLUCAP in the last 168 hours.    Scheduled Meds:  Continuous Infusions: . sodium chloride 50 mL/hr at 01/09/17 0544  . fluconazole (DIFLUCAN) IV Stopped (01/08/17 1824)  . piperacillin-tazobactam (ZOSYN)  IV      Assessment/Plan:  1. Acute hypoxic respiratory failure with agonal-type Breathing, patient in the process of dying continue supportive care 2. Clinical sepsis. Unclear source whether this is the stage IV  decubitus ulcer versus pneumonia. On aggressive antibiotics with vancomycin and Zosyn and Diflucan. Patient also with hypothermia.- family once discontinued 3. Hypernatremia secondary to not eating very well. 4. History of dementia 5. Type 2 diabetes mellitus 6. Stage IV decubitus buttock.  Local wound care. 7. anemia of chronic disease. holding off on further blood draws.  Code Status:     Code Status Orders        Start     Ordered   12/31/2016 2113  Do not attempt resuscitation (DNR)  Continuous    Question Answer Comment  In the event of cardiac or respiratory ARREST Do not call a "code blue"   In the event of cardiac or respiratory ARREST Do not perform Intubation, CPR, defibrillation or ACLS   In the event of cardiac or respiratory ARREST Use medication by any route, position, wound care, and other measures to relive pain and suffering.  May use oxygen, suction and manual treatment of airway obstruction as needed for comfort.      01/13/2017 2112    Code Status History    Date Active Date Inactive Code Status Order ID Comments User Context   01/14/2017  5:43 PM 01/01/2017  5:43 PM Partial Code [id number]  Altamese Dilling, MD ED   12/28/2016  3:12 PM 01/02/2017  2:41 PM DNR [id number]  Shaune Pollack, MD Inpatient   11/12/2016  1:52 PM 11/20/2016  5:05 PM DNR [id number]  Katha Hamming, MD ED   11/12/2016  1:39 PM 11/12/2016  1:52 PM Full Code [id number]  Katha Hamming, MD ED   07/10/2015 10:55 PM 07/26/2015  8:43 PM DNR [id number]  Oralia Manis, MD Inpatient   06/24/2015  5:41 PM 06/26/2015  7:08 PM DNR [id number]  Suan Halter, MD Inpatient   06/21/2015  8:04 PM 06/24/2015  5:41 PM Full Code [id number]  Houston Siren, MD Inpatient   06/12/2015  8:35 PM 06/15/2015  7:56 PM Full Code [id number]  Enid Baas, MD Inpatient    Advance Directive Documentation     Most Recent Value  Type of Advance Directive  Out of facility DNR (pink MOST or yellow form)  Pre-existing out of facility DNR order (yellow form or pink MOST form)  -  "MOST" Form in Place?  -     Family Communication: spoke with son at the bedside Disposition Plan: I don't expect the patient to survive much longer.  Antibiotics:  Vancomycin  Zosyn  Diflucan  Time spent: 25 minutes now  Marbeth Smedley, [facility]  [facility]

## 2017-01-10 LAB — VANCOMYCIN, RANDOM: VANCOMYCIN RM: 25

## 2017-01-19 IMAGING — CT CT HEAD W/O CM
3 series · 17 of 30 positions shown, 18 images · non-contrast
Comparison: 10/31/2013

CLINICAL DATA: Altered mental status

EXAM:
CT HEAD WITHOUT CONTRAST
TECHNIQUE: Contiguous axial images were obtained from the base of the skull
through the vertex without intravenous contrast.

[Series 2: head wo · axial · 0.39mm/px · z∈[-112,-12]mm · 5 of 30 slices shown]
[im 5/30  brain]
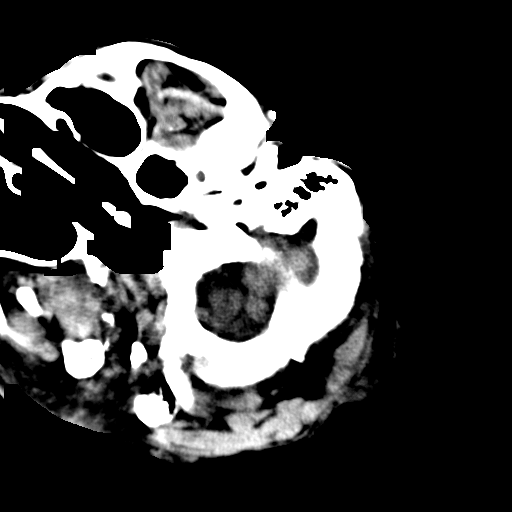
[im 10/30  brain]
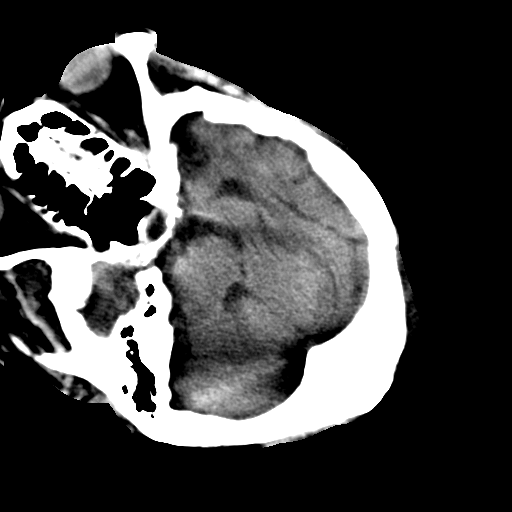
[im 15/30  brain]
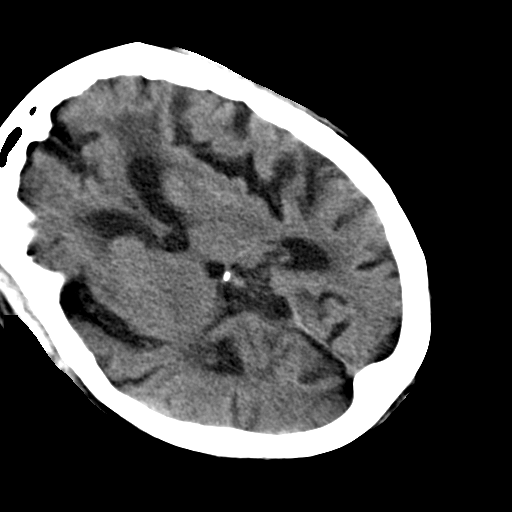
[im 20/30  brain]
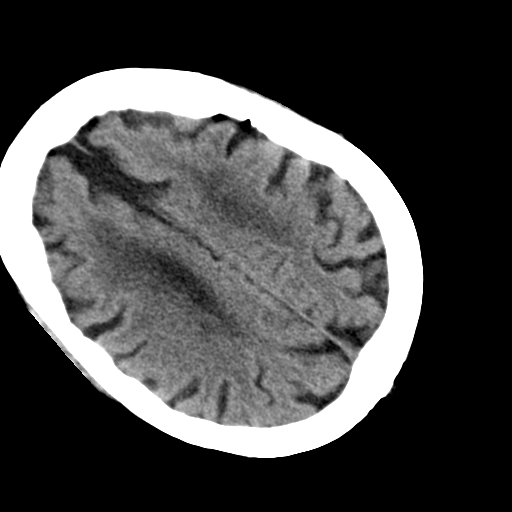
[im 25/30  brain]
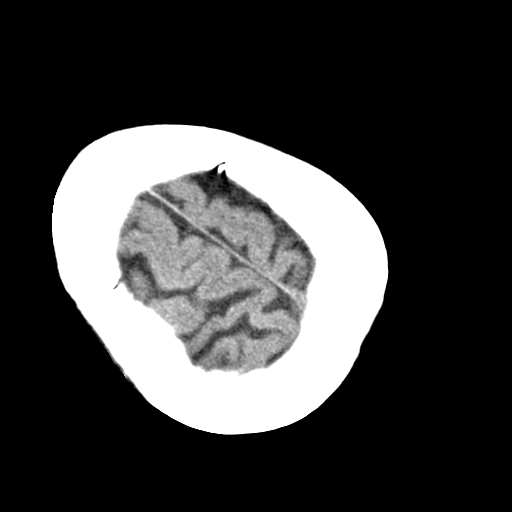

[Series 3: head bone · axial · 0.39mm/px · z∈[-132,+10]mm · 8 of 83 slices shown]
[im 6/83  bone]
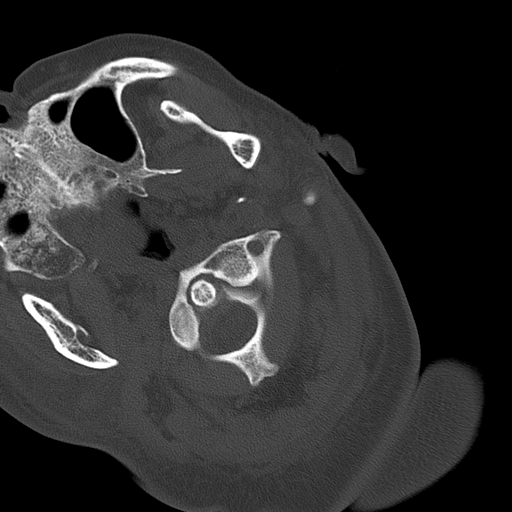
[im 16/83  bone]
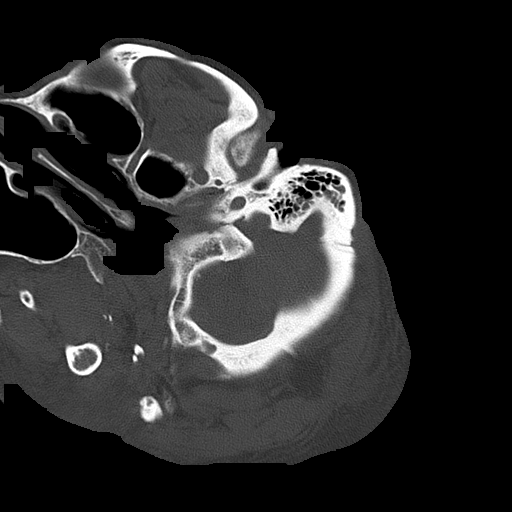
[im 26/83  bone]
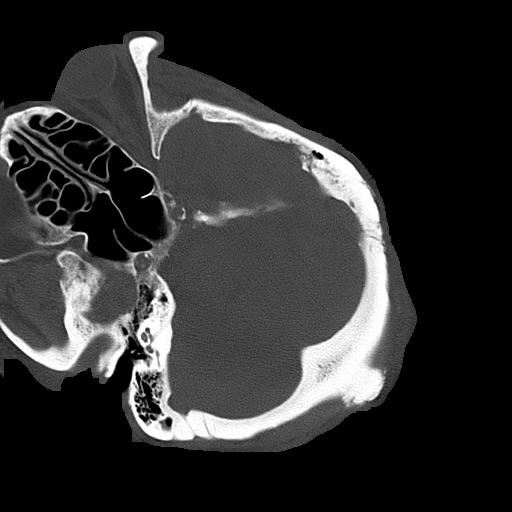
[im 36/83  bone]
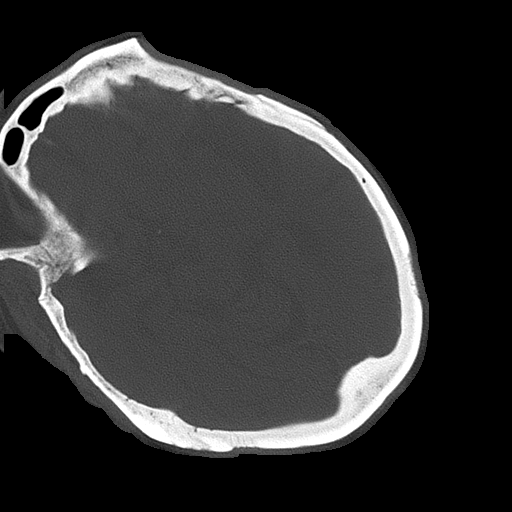
[im 47/83  bone]
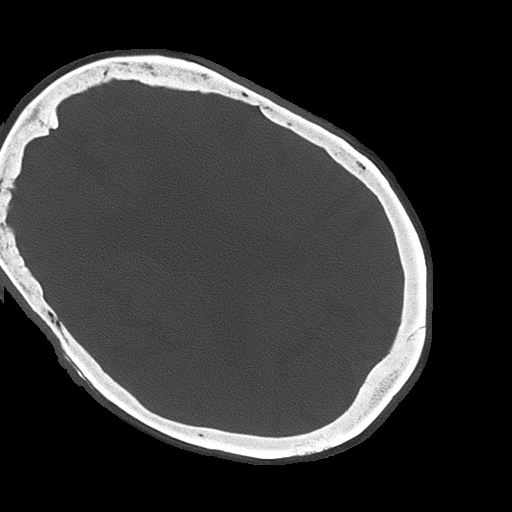
[im 57/83  bone]
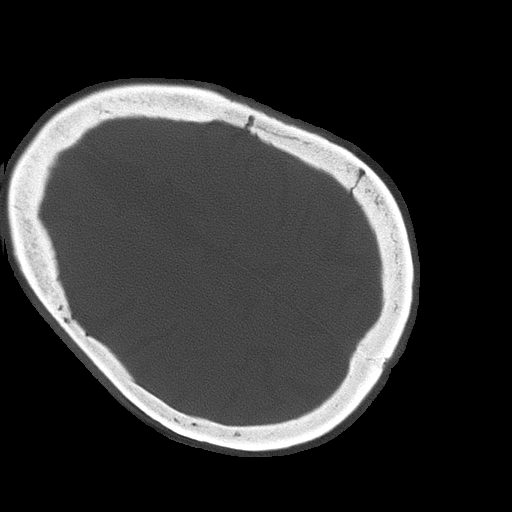
[im 67/83  bone]
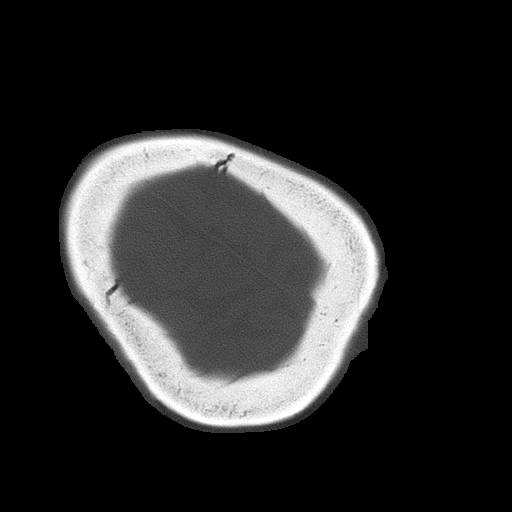
[im 77/83  bone]
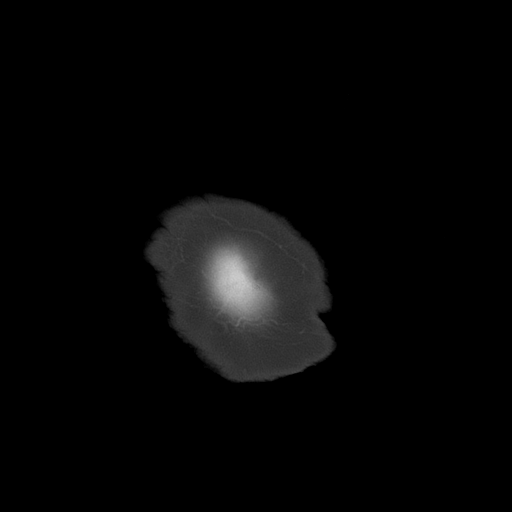

[Series 4: head wo recon · axial · 0.34mm/px · z∈[-80,+9]mm · 4 of 31 slices shown, 5 images]
[im 7/31  brain]
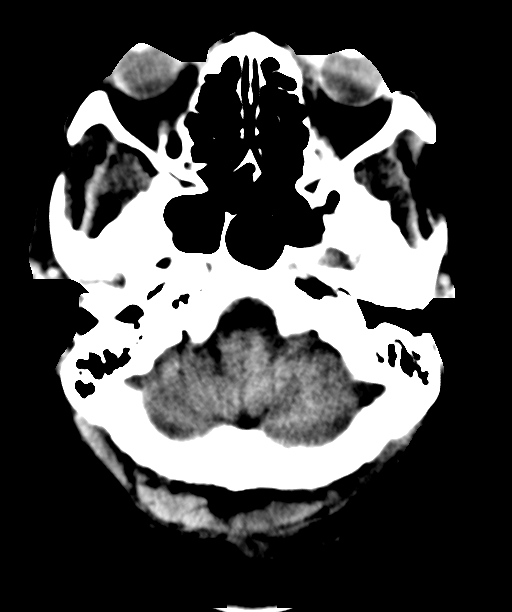
[im 7/31  bone]
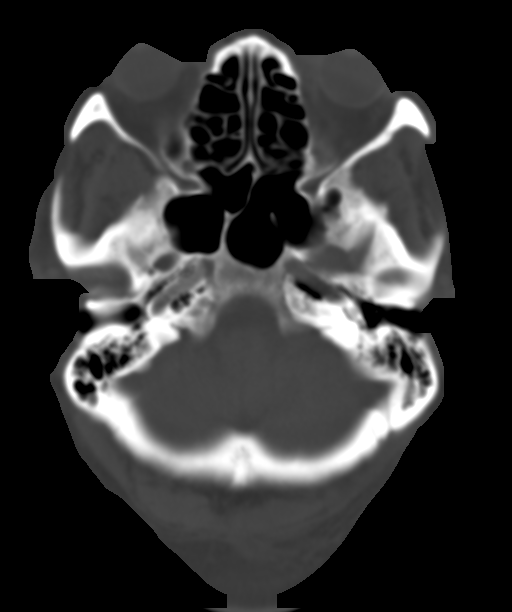
[im 13/31  brain]
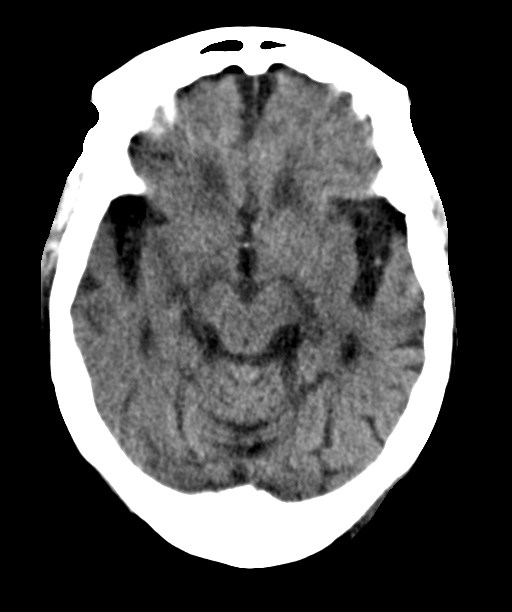
[im 19/31  brain]
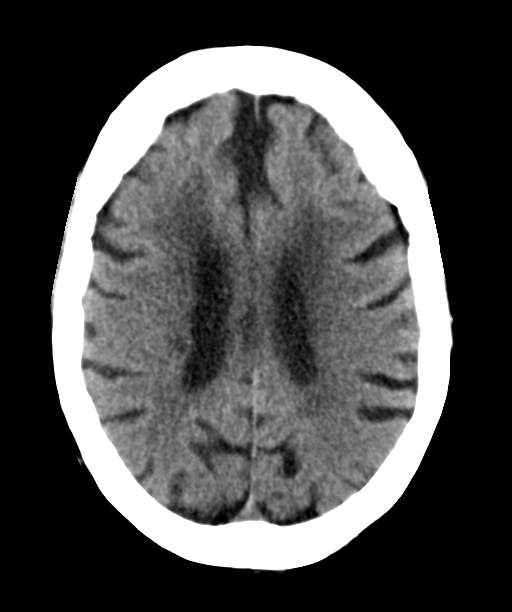
[im 25/31  brain]
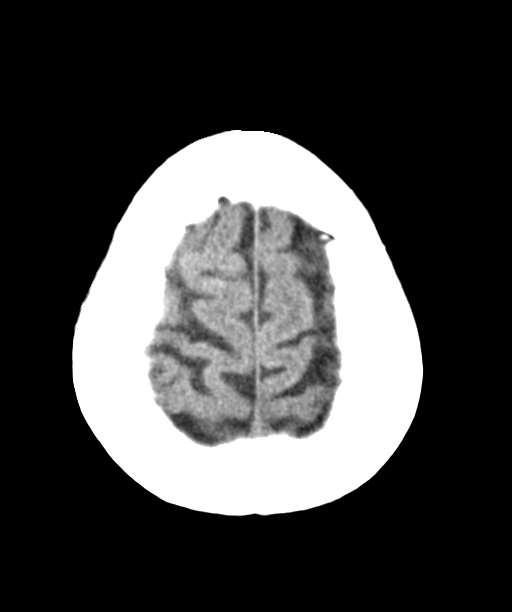

[17 of 30 positions shown; findings below may reference images not displayed]

FINDINGS: The bony calvarium is intact. No gross soft tissue abnormality is
noted. Diffuse atrophic changes are seen as well as chronic white
matter ischemic change. No findings to suggest acute hemorrhage,
acute infarction or space-occupying mass lesion are noted.
IMPRESSION: Chronic atrophic and ischemic changes without acute abnormality.

## 2017-01-21 NOTE — Progress Notes (Signed)
Pharmacy Antibiotic Note  Jasmine Briggs is a 83 y.o. female admitted on 01/09/2017 with  sepsis.  Patient has a stage IV sacral decubitus ulcer. Patient was recently admitted and discharged on 10/13 on cephalexin for a UTI (E.coli, pan-sensitive culture from 12/28/16).  Pharmacy has been consulted to dose vancomycin and piperacillin/tazobacta,.   Plan: Vancomycin level elevated prior to steady-state. Will d/c vancomycin and check another level and SCr in AM.   Continue piperacillin/tazobactam 3.375 g IV q8h EI  10/20:  Vanc level @ 0430 = 28.  Vanc level has only fallen 1 mcg/mL in 12 hrs.  Will hold Vanc and recheck level in 24 hrs on 10/21 @ 0500.   10/21:  Vanc level @ 0521 = 25.  Will hold vancomycin and recheck level on 10/22 @ 0500.   Height: 5\' 2"  (157.5 cm) Weight: 118 lb 6.4 oz (53.7 kg) IBW/kg (Calculated) : 50.1  No data recorded.   Recent Labs Lab 01/01/2017 1514 12/23/2016 1539 01/05/17 0531 01/06/17 0500 01/07/17 0402 01/08/17 1712 01/09/17 0431 01/09/17 0435 04/30/2016 0521  WBC  --  12.5* 14.2*  --  14.4*  --   --   --   --   CREATININE  --  1.03* 0.94 1.21* 1.14*  --  1.52*  --   --   LATICACIDVEN 1.6  --   --   --   --   --   --   --   --   VANCOTROUGH  --   --   --   --   --  2929*  --   --   --   VANCORANDOM  --   --   --   --   --   --   --  28 25    Estimated Creatinine Clearance: 18.3 mL/min (A) (by C-G formula based on SCr of 1.52 mg/dL (H)).    No Known Allergies  Antimicrobials this admission: 10/15 piperacillin/tazobactam  >>  10/15 Vancomycin >>   Microbiology results: 10/15  BCx: NGTD 10/15  UCx: yeast 10/15  Sputum: Pseudomonas and E coli 10/15: MRSA PCR negative  Thank you for allowing pharmacy to be a part of this patient's care.  Scherrie Gerlachobbins,Laruth Hanger D, PharmD Clinical Pharmacist 03-05-17 6:28 AM

## 2017-01-21 NOTE — Progress Notes (Signed)
Patient prepared with assistance of AC. Funeral home notified. Belongings returned to family. Emotional support provided. Bo McclintockBrewer,Adelma Bowdoin S, RN

## 2017-01-21 NOTE — Progress Notes (Signed)
Patient expired at [id number]. Allena KatzPatel, [facility] notified. Family at bedside, emotional support provided. Bo McclintockBrewer,Winston Sobczyk S, RN

## 2017-01-21 NOTE — Progress Notes (Signed)
Notified MD of increased edema and no urine output. New order to discontinue fluids placed.

## 2017-01-21 NOTE — Death Summary Note (Signed)
Diagnosis at time of death:  1. Acute hypoxic respiratory failure 2. Sepsis  3. aspiration pna 4. Hypernatremia 5. Stage 4 decubitus ulcer POA on buttock 6. dmii 7. Anemia of  Chronic disease   Hospital course and death summary: Jasmine Briggs  is a 83 y.o. female with a known history of dementia, DM, Htn, Osteomyelitis on Sacral wound- has been admitted to hospital 3 times with in past few months, was recently admitted with UTI and SBO. Pt was subsequently discharged home. Bought back due to decrease responsiveness and aspiration PNA and possible sepsis. Pt family on previous admission was encouraged to consider hospices home. However they decided against that. This admission pt was treated with IV abx however no significant improvement in her condition. Pt was DNR , She passed away earlier this am.

## 2017-01-21 NOTE — Progress Notes (Signed)
Pt remains unresponsive. Pt continues with agonal breathing. Unable to obtain BP or temp at this time.

## 2017-01-21 NOTE — Progress Notes (Signed)
CH responded to a PG for End of Life. Pt had passed earlier this morning. Family was bedside reminiscing. CH listened to stories of their mother who was firm and loving. CH offered prayer of thanksgiving. CH assisted RN in getting funeral home information. Family is in a good space.    01/07/2017 1300  Clinical Encounter Type  Visited With Family  Visit Type Initial;Spiritual support;Death  Referral From Nurse  Spiritual Encounters  Spiritual Needs Prayer;Emotional

## 2017-01-21 DEATH — deceased

## 2017-01-28 IMAGING — CR DG CHEST 2V
1 series · 2 of 2 positions shown · non-contrast
Comparison: Portable chest x-ray June 12, 2015

CLINICAL DATA: Increased sputum production with hemoptysis, 1 week
history of chest congestion, chronically altered mental status.

EXAM:
CHEST  2 VIEW

[Series 1: dg chest 2 view · 0.14mm/px · 2 of 2 slices shown]
[im 1/2]
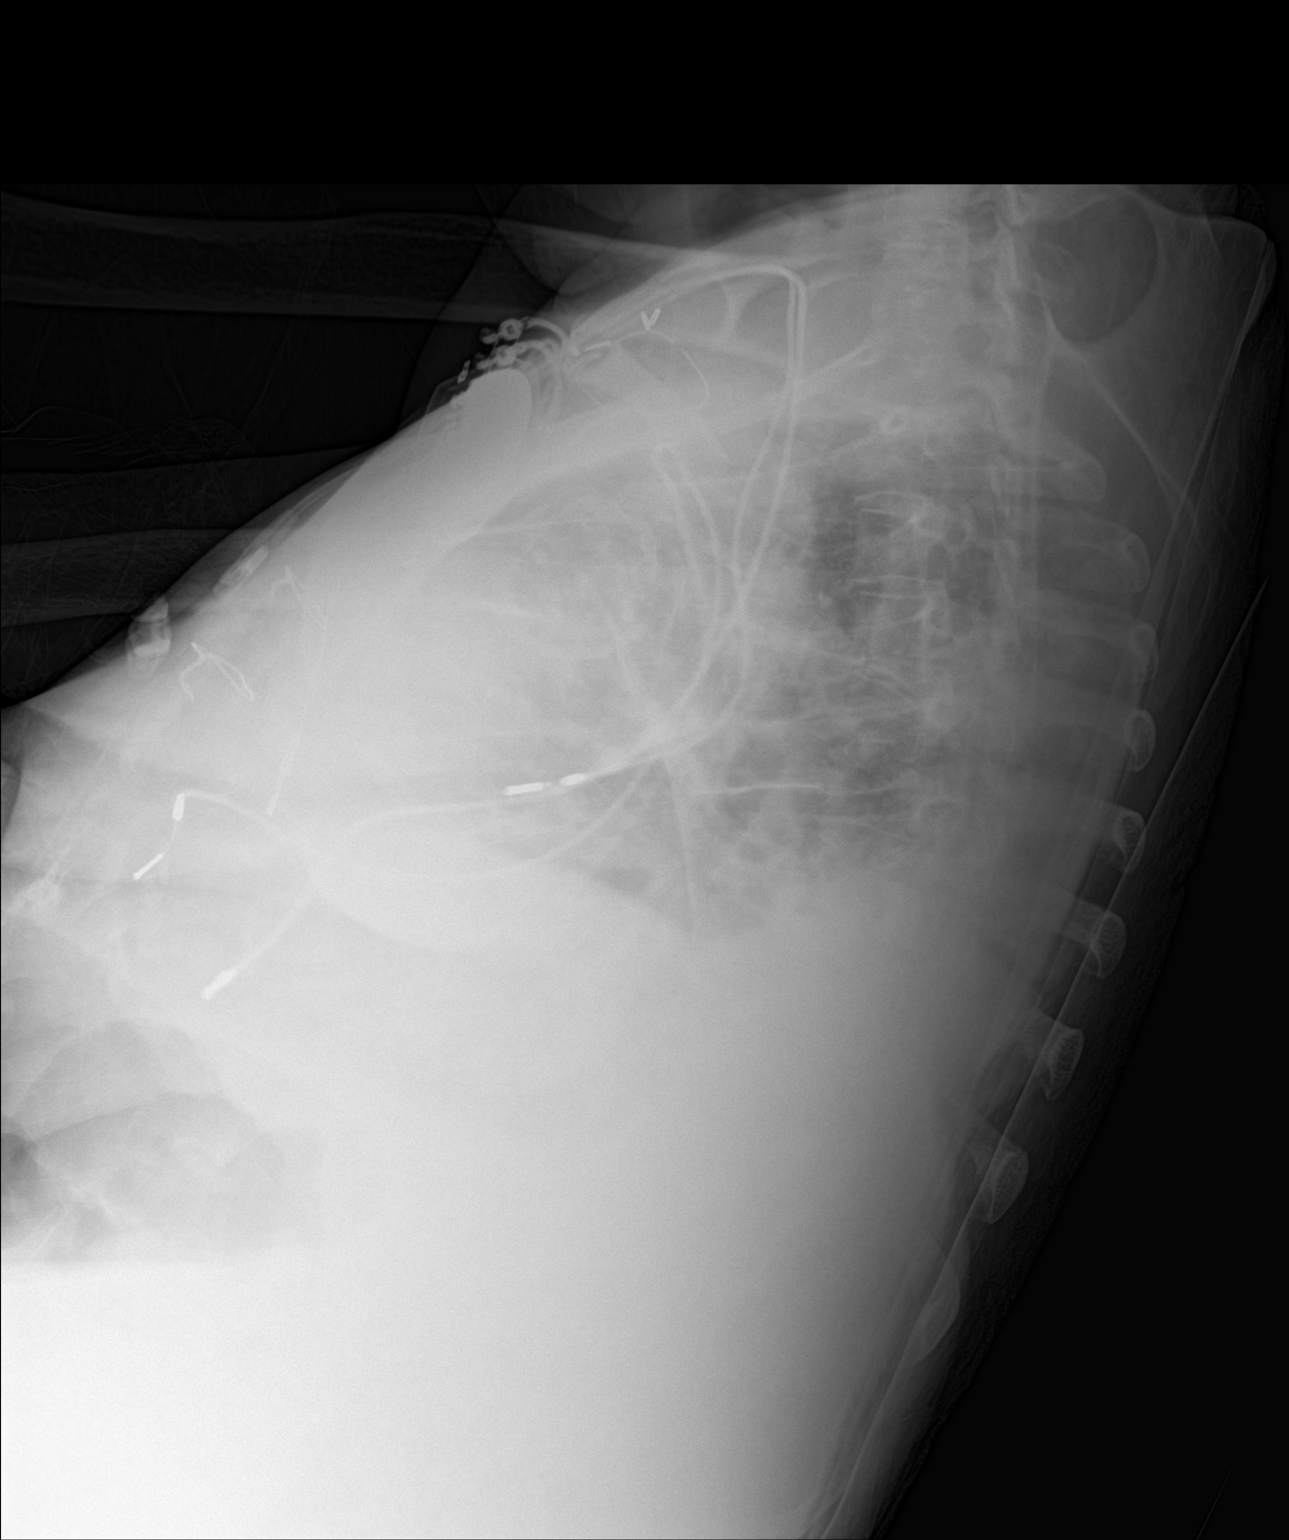
[im 2/2]
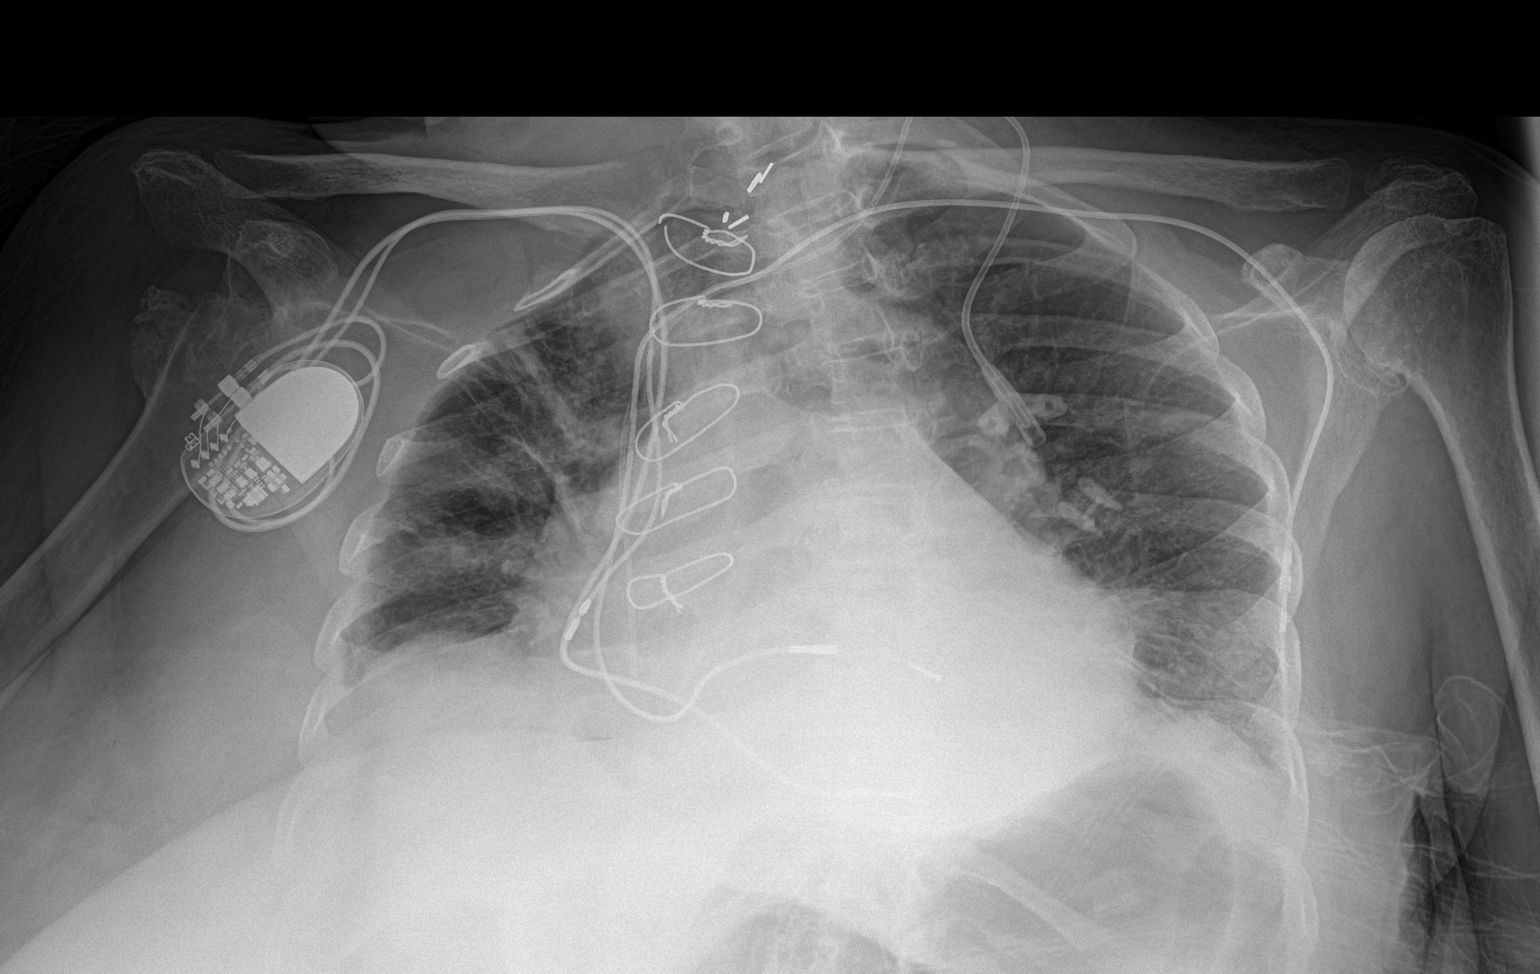

[2 of 2 positions shown; findings below may reference images not displayed]

FINDINGS: Today's study is obtained in a somewhat more lordotic fashion than
the previous study. The lungs are mildly hypoinflated. The
interstitial markings are coarse. There is density in the right
upper lobe that is more conspicuous today. The left retrocardiac
density has increased. The cardiac silhouette remains enlarged. The
pulmonary vascularity is not clearly engorged. A small right pleural
effusion is suspected. The permanent pacemaker is in reasonable
position radiographically. The sternal wires are intact. The left
internal jugular venous catheter tip projects over the distal third
of the SVC.
IMPRESSION: Increased density in the right upper lobe worrisome for progressive
pneumonia. Increased retrocardiac density on the left consistent
with worsening atelectasis or development of pneumonia as well. Mild
stick stable cardiac silhouette enlargement without pulmonary
vascular congestion.

## 2018-08-14 IMAGING — DX DG CHEST 1V PORT
1 series · 1 of 1 positions shown · non-contrast
Comparison: Portable exam 9979 hours compared to 01/28/2017

CLINICAL DATA: Shortness of breath, history dementia, hypertension,
diabetes mellitus

EXAM:
PORTABLE CHEST 1 VIEW

[chest ap]
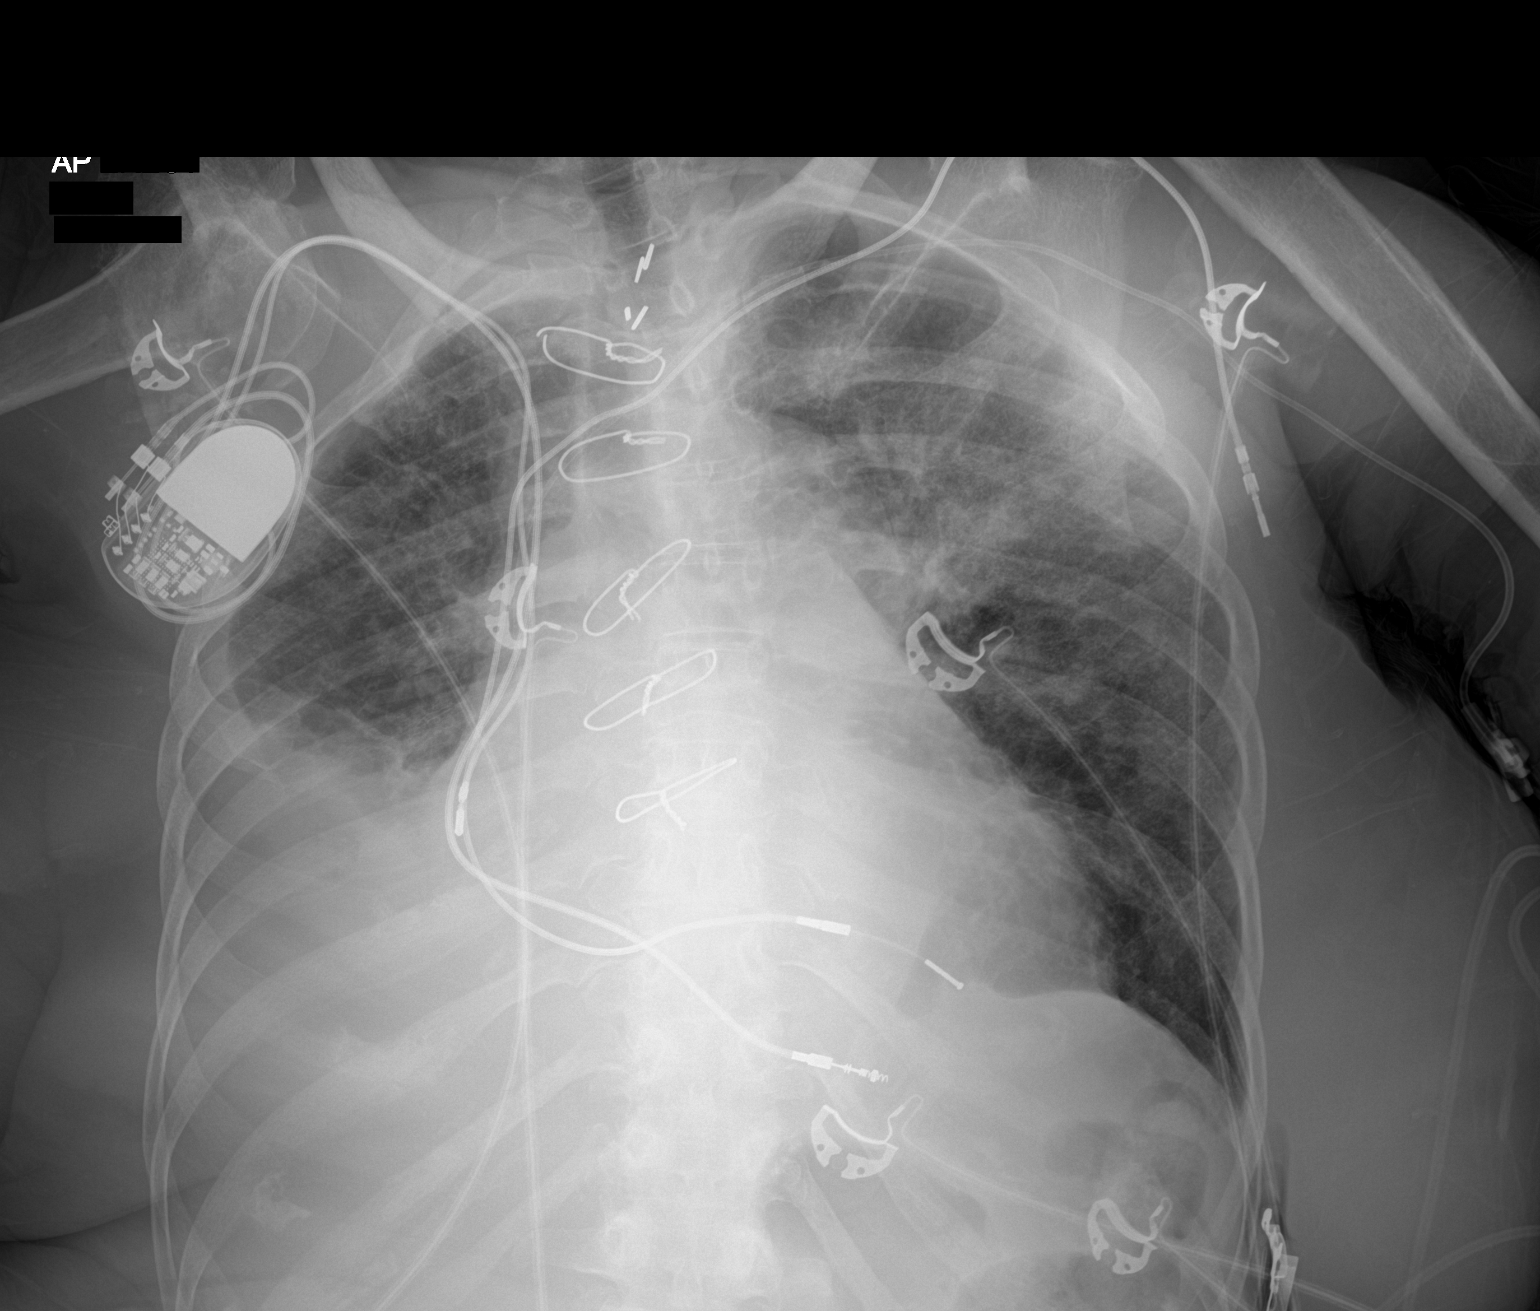

[1 of 1 positions shown; findings below may reference images not displayed]

FINDINGS: BILATERAL subclavian pacemaker leads projecting over RIGHT atrium
and RIGHT ventricle. Pacemaker generator attached to RIGHT
subclavian pacemaker leads.

Enlargement of cardiac silhouette post CABG.

BILATERAL pulmonary infiltrates appear increased in the upper lobes.

RIGHT pleural effusion and basilar atelectasis.

No pneumothorax.

Bones demineralized.
IMPRESSION: BILATERAL pulmonary infiltrates question pulmonary edema or less
likely infection with associated RIGHT pleural effusion and basilar
atelectasis.
